# Patient Record
Sex: Female | Born: 1955 | Race: Black or African American | Hispanic: No | Smoking: Former smoker
Health system: Southern US, Community
[De-identification: ages and names within clinical notes are randomized; demographics above are authoritative.]

## PROBLEM LIST (undated history)

## (undated) DIAGNOSIS — M109 Gout, unspecified: Secondary | ICD-10-CM

## (undated) DIAGNOSIS — M797 Fibromyalgia: Secondary | ICD-10-CM

## (undated) DIAGNOSIS — F32A Depression, unspecified: Secondary | ICD-10-CM

## (undated) DIAGNOSIS — M1990 Unspecified osteoarthritis, unspecified site: Secondary | ICD-10-CM

## (undated) DIAGNOSIS — G4700 Insomnia, unspecified: Secondary | ICD-10-CM

## (undated) DIAGNOSIS — Z780 Asymptomatic menopausal state: Secondary | ICD-10-CM

## (undated) DIAGNOSIS — E785 Hyperlipidemia, unspecified: Secondary | ICD-10-CM

## (undated) DIAGNOSIS — K635 Polyp of colon: Secondary | ICD-10-CM

## (undated) DIAGNOSIS — I10 Essential (primary) hypertension: Secondary | ICD-10-CM

## (undated) DIAGNOSIS — F329 Major depressive disorder, single episode, unspecified: Secondary | ICD-10-CM

## (undated) HISTORY — DX: Unspecified osteoarthritis, unspecified site: M19.90

## (undated) HISTORY — PX: STRABISMUS SURGERY: SHX218

## (undated) HISTORY — DX: Depression, unspecified: F32.A

## (undated) HISTORY — DX: Essential (primary) hypertension: I10

## (undated) HISTORY — DX: Hyperlipidemia, unspecified: E78.5

## (undated) HISTORY — DX: Polyp of colon: K63.5

## (undated) HISTORY — DX: Fibromyalgia: M79.7

## (undated) HISTORY — DX: Insomnia, unspecified: G47.00

## (undated) HISTORY — DX: Asymptomatic menopausal state: Z78.0

## (undated) HISTORY — DX: Major depressive disorder, single episode, unspecified: F32.9

---

## 1988-02-15 HISTORY — PX: TUBAL LIGATION: SHX77

## 2004-03-17 ENCOUNTER — Emergency Department: Payer: Self-pay | Admitting: Emergency Medicine

## 2004-09-06 ENCOUNTER — Ambulatory Visit: Payer: Self-pay

## 2005-06-16 ENCOUNTER — Emergency Department: Payer: Self-pay | Admitting: Emergency Medicine

## 2005-11-23 ENCOUNTER — Ambulatory Visit: Payer: Self-pay | Admitting: Nurse Practitioner

## 2006-05-16 ENCOUNTER — Emergency Department: Payer: Self-pay | Admitting: Emergency Medicine

## 2007-01-17 ENCOUNTER — Emergency Department: Payer: Self-pay | Admitting: Emergency Medicine

## 2007-02-15 DIAGNOSIS — K635 Polyp of colon: Secondary | ICD-10-CM

## 2007-02-15 HISTORY — DX: Polyp of colon: K63.5

## 2007-02-15 HISTORY — PX: COLONOSCOPY: SHX174

## 2007-04-05 ENCOUNTER — Ambulatory Visit: Payer: Self-pay | Admitting: Gastroenterology

## 2007-05-16 LAB — HM COLONOSCOPY

## 2007-06-14 ENCOUNTER — Ambulatory Visit: Payer: Self-pay | Admitting: Gastroenterology

## 2007-09-14 ENCOUNTER — Ambulatory Visit: Payer: Self-pay | Admitting: Family Medicine

## 2008-01-28 ENCOUNTER — Emergency Department: Payer: Self-pay

## 2008-09-23 ENCOUNTER — Ambulatory Visit: Payer: Self-pay | Admitting: Specialist

## 2009-01-14 HISTORY — PX: KNEE SURGERY: SHX244

## 2009-01-30 ENCOUNTER — Ambulatory Visit: Payer: Self-pay | Admitting: Specialist

## 2009-02-11 ENCOUNTER — Ambulatory Visit: Payer: Self-pay | Admitting: Specialist

## 2009-04-13 ENCOUNTER — Ambulatory Visit: Payer: Self-pay | Admitting: Family Medicine

## 2009-08-03 ENCOUNTER — Emergency Department: Payer: Self-pay | Admitting: Emergency Medicine

## 2009-08-12 ENCOUNTER — Ambulatory Visit: Payer: Self-pay | Admitting: Specialist

## 2009-08-28 ENCOUNTER — Encounter: Payer: Self-pay | Admitting: Cardiovascular Disease

## 2009-09-02 ENCOUNTER — Ambulatory Visit: Payer: Self-pay | Admitting: Cardiovascular Disease

## 2009-09-02 DIAGNOSIS — I10 Essential (primary) hypertension: Secondary | ICD-10-CM | POA: Insufficient documentation

## 2009-09-02 DIAGNOSIS — R9431 Abnormal electrocardiogram [ECG] [EKG]: Secondary | ICD-10-CM | POA: Insufficient documentation

## 2009-11-17 ENCOUNTER — Ambulatory Visit: Payer: Self-pay | Admitting: Pain Medicine

## 2009-12-03 ENCOUNTER — Ambulatory Visit: Payer: Self-pay | Admitting: Pain Medicine

## 2010-02-11 ENCOUNTER — Ambulatory Visit: Payer: Self-pay | Admitting: Family Medicine

## 2010-02-18 ENCOUNTER — Emergency Department: Payer: Self-pay | Admitting: Internal Medicine

## 2010-03-16 NOTE — Assessment & Plan Note (Signed)
Summary: NEW PT   Visit Type:  Initial Consult Primary Provider:  Dr. Ancil Boozer  CC:  c/o problems with fibromyalgia.Marland Kitchen  History of Present Illness: Cheryl Barr is a pleasant 55 year old woman with a history of diabetes, hypertension, hyperlipidemia, abnormal EKG who presents by referral from Dr. Ancil Boozer for evaluation of an abnormal EKG.  Cheryl Barr denies any significant chest discomfort, shortness of breath, lightheadedness. She does have chronic right lower extremity lateral thigh discomfort which is chronic and quite severe at times. This has been ongoing since May. Prior to this, she was very active and had no complaints. She does report having some menopausal type symptoms with flushing at times. The recent heat wave has made this very difficult.  EKG from March 2010 shows normal sinus rhythm with rate of 51 beats per minute him a nonspecific T-wave abnormality in the anterolateral leads and V1 and AVL.  EKG from August 28 2009 shows normal sinus rhythm with rate 59 beats per minute with nonspecific T-wave abnormality in V4 through V6.   Preventive Screening-Counseling & Management  Alcohol-Tobacco     Smoking Status: never  Caffeine-Diet-Exercise     Does Patient Exercise: no  Current Medications (verified): 1)  Calcium With Vitamin D .... 1 Once Daily 2)  Actos 30 Mg Tabs (Pioglitazone Hcl) .Marland Kitchen.. 1 Once Daily 3)  Exforge Hct 10-320-25 Mg Tabs (Amlodipine-Valsartan-Hctz) .Marland Kitchen.. 1 Once Daily 4)  Carvedilol 12.5 Mg Tabs (Carvedilol) .Marland Kitchen.. 1 Two Times A Day 5)  Simvastatin 40 Mg Tabs (Simvastatin) .Marland Kitchen.. 1 Once Daily 6)  Meloxicam 15 Mg Tabs (Meloxicam) .Marland Kitchen.. 1 Once Daily 7)  Gabapentin 300 Mg Caps (Gabapentin) .Marland Kitchen.. 1 Once Daily  Allergies (verified): No Known Drug Allergies   Past History:  Family History: Last updated: 09/01/2009 Family History of Coronary Artery Disease: Father deceased MI age 23. Family History of Diabetes: Mother Family History of Hypertension: Mother  Social  History: Last updated: 09/02/2009 Alcohol Use - no Tobacco Use - No.  Regular Exercise - no Widowed   Risk Factors: Exercise: no (09/02/2009)  Risk Factors: Smoking Status: never (09/02/2009)  Past Medical History: Hyperlipidemia Hypertension Diabetes Type 2 Vitamin D Deficency insomnia depression menopause fibromyalgia  Social History: Alcohol Use - no Tobacco Use - No.  Regular Exercise - no Widowed  Smoking Status:  never Does Patient Exercise:  no  Review of Systems  The patient denies fever, weight loss, weight gain, vision loss, decreased hearing, hoarseness, chest pain, syncope, dyspnea on exertion, peripheral edema, prolonged cough, abdominal pain, incontinence, muscle weakness, depression, and enlarged lymph nodes.         Right lateral leg pain  Vital Signs:  Patient profile:   55 year old female Height:      68 inches Weight:      232 pounds BMI:     35.40 Pulse rate:   84 / minute BP sitting:   136 / 80  (left arm) Cuff size:   large  Vitals Entered By: Dolores Lory, CMA (September 02, 2009 2:34 PM)  Physical Exam  General:  Well developed, well nourished, in no acute distress. Head:  normocephalic and atraumatic Neck:  Neck supple, no JVD. No masses, thyromegaly or abnormal cervical nodes. Lungs:  Clear bilaterally to auscultation and percussion. Heart:  Non-displaced PMI, chest non-tender; regular rate and rhythm, S1, S2 without murmurs, rubs or gallops. Carotid upstroke normal, no bruit. . Pedals normal pulses. No edema, no varicosities. Abdomen:  Bowel sounds positive; abdomen soft and non-tender without  masses Msk:  Back normal, normal gait. Muscle strength and tone normal. Pulses:  pulses normal in all 4 extremities Extremities:  No clubbing or cyanosis. Neurologic:  Alert and oriented x 3. Skin:  Intact without lesions or rashes. Psych:  Normal affect.   Impression & Recommendations:  Problem # 1:  ABNORMAL EKG (ICD-794.31) EKG from  March 2010 shows borderline anterolateral and lateral T wave abnormality. repeat EKG last week again shows the same abnormalities. She is asymptomatic. She's been treated aggressively for her diabetes, hyperlipidemia and hypertension.  We have suggested to her that we can watch her for now with no additional testing as she has no symptoms. If she has symptoms of shortness of breath chest discomfort with exertion, I have asked her to contact us for further evaluation. She is in agreement with this and I have given her our contact information.  I did suggest to her that she could start an aspirin 81 mg daily. given her diabetes, her LDL goal should be less than 100.   The following medications were removed from the medication list:    Aspir-low 81 Mg Tbec (Aspirin) .Marland Kitchen... 1 once daily Her updated medication list for this problem includes:    Exforge Hct 10-320-25 Mg Tabs (Amlodipine-valsartan-hctz) .Marland Kitchen... 1 once daily    Carvedilol 12.5 Mg Tabs (Carvedilol) .Marland Kitchen... 1 two times a day    Aspirin 81 Mg Tbec (Aspirin) .Marland Kitchen... Take one tablet by mouth daily  Problem # 2:  HYPERTENSION, BENIGN (ICD-401.1)  Blood pressure is reasonably well controlled current medication regimen.  The following medications were removed from the medication list:    Aspir-low 81 Mg Tbec (Aspirin) .Marland Kitchen... 1 once daily Her updated medication list for this problem includes:    Exforge Hct 10-320-25 Mg Tabs (Amlodipine-valsartan-hctz) .Marland Kitchen... 1 once daily    Carvedilol 12.5 Mg Tabs (Carvedilol) .Marland Kitchen... 1 two times a day    Aspirin 81 Mg Tbec (Aspirin) .Marland Kitchen... Take one tablet by mouth daily  The following medications were removed from the medication list:    Aspir-low 81 Mg Tbec (Aspirin) .Marland Kitchen... 1 once daily Her updated medication list for this problem includes:    Exforge Hct 10-320-25 Mg Tabs (Amlodipine-valsartan-hctz) .Marland Kitchen... 1 once daily    Carvedilol 12.5 Mg Tabs (Carvedilol) .Marland Kitchen... 1 two times a day    Aspirin 81 Mg Tbec  (Aspirin) .Marland Kitchen... Take one tablet by mouth daily  Problem # 3:  HYPERLIPIDEMIA-MIXED (ICD-272.4)  Recent change from Pravachol to simvastatin 40 mg daily.  Her updated medication list for this problem includes:    Simvastatin 40 Mg Tabs (Simvastatin) .Marland Kitchen... 1 once daily  Her updated medication list for this problem includes:    Simvastatin 40 Mg Tabs (Simvastatin) .Marland Kitchen... 1 once daily  Patient Instructions: 1)  Your physician has recommended you make the following change in your medication: START aspirin 81 mg daily 2)  Your physician wants you to follow-up in:   prn You will receive a reminder letter in the mail two months in advance. If you don't receive a letter, please call our office to schedule the follow-up appointment.

## 2010-03-16 NOTE — Letter (Signed)
Summary: [facility] NOTE  [facility] NOTE   Imported By: Luana Shu 09/02/2009 16:23:18  _____________________________________________________________________  External Attachment:    Type:   Image     Comment:   External Document

## 2010-03-17 LAB — HM DEXA SCAN: HM Dexa Scan: NORMAL

## 2010-09-02 ENCOUNTER — Encounter: Payer: Self-pay | Admitting: Cardiovascular Disease

## 2011-10-27 ENCOUNTER — Emergency Department: Payer: Self-pay | Admitting: Emergency Medicine

## 2013-02-14 HISTORY — PX: BREAST SURGERY: SHX581

## 2013-03-19 LAB — HM PAP SMEAR: HM Pap smear: NORMAL

## 2013-04-23 ENCOUNTER — Ambulatory Visit: Payer: Self-pay | Admitting: Family Medicine

## 2013-04-23 LAB — HM MAMMOGRAPHY

## 2013-05-10 ENCOUNTER — Ambulatory Visit: Payer: Self-pay | Admitting: Family Medicine

## 2013-05-21 ENCOUNTER — Ambulatory Visit: Payer: Self-pay | Admitting: Family Medicine

## 2013-05-23 LAB — PATHOLOGY REPORT

## 2013-05-24 HISTORY — PX: BREAST BIOPSY: SHX20

## 2013-05-26 ENCOUNTER — Emergency Department: Payer: Self-pay | Admitting: Emergency Medicine

## 2013-05-30 ENCOUNTER — Ambulatory Visit (INDEPENDENT_AMBULATORY_CARE_PROVIDER_SITE_OTHER): Payer: Commercial Managed Care - PPO | Admitting: General Surgery

## 2013-05-30 ENCOUNTER — Other Ambulatory Visit: Payer: Commercial Managed Care - PPO

## 2013-05-30 ENCOUNTER — Encounter: Payer: Self-pay | Admitting: General Surgery

## 2013-05-30 VITALS — BP 122/82 | HR 80 | Resp 18 | Ht 67.0 in | Wt 228.0 lb

## 2013-05-30 DIAGNOSIS — N63 Unspecified lump in unspecified breast: Secondary | ICD-10-CM

## 2013-05-30 NOTE — Progress Notes (Addendum)
Patient ID: Cheryl Barr, female   DOB: 05-12-1955, 58 y.o.   MRN: [id number]  Chief Complaint  Patient presents with  . Other    New pt evaluation of left breast mass. Mammogram and Biopsy done at [facility]    HPI Cheryl Barr is a 58 y.o. female who presents for a breast evaluation. The most recent mammogram was done on 05/10/13. Patient does not perform regular self breast checks and has not had a mammogram done since 2011.    She also had a biopsy done on 05/21/13.  She denies any problems with her breasts.  Her father was diagnosed in his 37 with breast cancer.   HPI  Past Medical History  Diagnosis Date  . Hyperlipidemia   . Hypertension   . Diabetes mellitus     Type 2  . Vitamin D deficiency   . Insomnia   . Depression   . Menopause   . Fibromyalgia   . Arthritis   . Colon polyp 2009    Past Surgical History  Procedure Laterality Date  . Colonoscopy  2009    1 polyp found. Dr. Roselyn Reef    Family History  Problem Relation Age of Onset  . Hypertension Mother   . Diabetes Mother   . Heart attack Father   . Coronary artery disease Father   . Cancer Father 64    breast    Social History History  Substance Use Topics  . Smoking status: Never Smoker   . Smokeless tobacco: Never Used     Comment: Tobacco use-no  . Alcohol Use: No    No Known Allergies  Current Outpatient Prescriptions  Medication Sig Dispense Refill  . Amlodipine-Valsartan-HCTZ (EXFORGE HCT) 10-320-25 MG TABS Take 1 tablet by mouth daily.        Marland Kitchen aspirin 81 MG EC tablet Take 81 mg by mouth daily.        Marland Kitchen CALCIUM-VITAMIN D PO Take 1 tablet by mouth daily.        . carvedilol (COREG) 12.5 MG tablet Take 12.5 mg by mouth 2 (two) times daily.        Marland Kitchen gabapentin (NEURONTIN) 300 MG capsule Take 300 mg by mouth daily.        . meloxicam (MOBIC) 15 MG tablet Take 15 mg by mouth daily.        . pioglitazone (ACTOS) 30 MG tablet Take 30 mg by mouth daily.        . simvastatin (ZOCOR) 40 MG tablet  Take 40 mg by mouth daily.         No current facility-administered medications for this visit.    Review of Systems Review of Systems  Constitutional: Negative.   Respiratory: Negative.   Cardiovascular: Negative.     Blood pressure 122/82, pulse 80, resp. rate 18, height 5\' 7"  (1.702 m), weight 228 lb (103.42 kg).  Physical Exam Physical Exam  Constitutional: She is oriented to person, place, and time. She appears well-developed and well-nourished.  Neck: Neck supple. No thyromegaly present.  Cardiovascular: Normal rate, regular rhythm and normal heart sounds.   No murmur heard. Pulmonary/Chest: Effort normal and breath sounds normal. Right breast exhibits no inverted nipple, no mass, no nipple discharge, no skin change and no tenderness. Left breast exhibits no inverted nipple, no mass, no nipple discharge, no skin change and no tenderness.  1.5 cm nodule at 3 o'clock at the biopsy site.   Lymphadenopathy:    She has no cervical adenopathy.  She has no axillary adenopathy.  Neurological: She is alert and oriented to person, place, and time.  Skin: Skin is warm and dry.    Data Reviewed Biopsy dated May 21, 2013 reported a small fragment of benign breast epithelium with focal acute inflammation. No evidence of atypia or malignancy.  Screening mammograms of April 23, 2013 suggested a density in the left breast for which additional views and ultrasound were requested. BI-RAD-0.  Focal spot compression views and ultrasound dated May 10, 2013 showed a persistent density in the retroareolar area of the left breast. Ultrasound showed a 7 x 8 x 10 mm intraductal mass within the retroareolar tissue with internal blood flow. Impression was papilloma versus neoplasm. Biopsy was recommended. BI-RAD-4.  Ultrasound-guided core biopsy dated May 21, 2013 and postbiopsy mammograms were reviewed.  PCP notes from April 05, 2013 were reviewed.  Ultrasound examination of the left  breast an area of palpable mass near the biopsy site, 3 o'clock position, 2 cm from the nipple showed a 0.78 x 1.26 x 1.65 hypoechoic mass with evidence of a marking clip in place. This is in continuity with a modestly dilated duct. A cystic area measuring 0.5 x 0.7 x 0.9 cm it is inferior to the intraluminal lesion which measures 0.9 x 1.0 cm. In this primarily cystic area appears to be the biopsy clip.  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       Plan    Considering the previous biopsy, and the fact that the mass extends almost to the undersurface of the dermis the patient will likely best be served by surgical excision of this area.  Due to late hour it was not possible to review this plan with her primary physician who will be contacted in the morning.       PCP: Steele Sizer  I spoke with Dr. Duard Brady [facility]) the morning after the patient's visit. She reports that she been contacted by ancillary staff from the radiology department reporting inadequate biopsy had been obtained and surgical referral was recommended. ( Neither) Either she nor I could identify any addendum to the 05/21/2013 postbiopsy mammogram report mentioning the pathology or inadequacy of sample.  The patient will be contacted and arrangements made for open surgical biopsy.  Forest Gleason Naimah Yingst 05/31/2013, 5:38 AM

## 2013-05-30 NOTE — Patient Instructions (Signed)
The patient is aware to call back for any questions or concerns. Dr. Bary Castilla will contact Dr. Ancil Boozer and our office will contact the patient with the next step.

## 2013-05-31 ENCOUNTER — Telehealth: Payer: Self-pay

## 2013-05-31 ENCOUNTER — Encounter: Payer: Self-pay | Admitting: General Surgery

## 2013-05-31 DIAGNOSIS — N63 Unspecified lump in unspecified breast: Secondary | ICD-10-CM | POA: Insufficient documentation

## 2013-05-31 NOTE — Telephone Encounter (Signed)
Spoke with patient about scheduling breast biopsy at [facility] and patient is amendable to this. Patient is scheduled for surgery at [facility] on 06/21/13. She will pre admit by phone on 06/14/13. Patient is aware of date and instructions. Surgery instruction sheet mailed to patient.

## 2013-05-31 NOTE — Telephone Encounter (Signed)
Message copied by Lesly Rubenstein on Fri May 31, 2013 12:57 PM ------      Message from: Robert Bellow      Created: Fri May 31, 2013  8:41 AM       Scheduling slip submitted for left breast biopsy. As mentioned the patient this might be done in the office, I think she would be best served by having it done as an outpatient at [facility]. His can be scheduled at her convenience. ------

## 2013-06-09 ENCOUNTER — Other Ambulatory Visit: Payer: Self-pay | Admitting: General Surgery

## 2013-06-09 DIAGNOSIS — N63 Unspecified lump in unspecified breast: Secondary | ICD-10-CM

## 2013-06-17 ENCOUNTER — Ambulatory Visit: Payer: Self-pay | Admitting: Anesthesiology

## 2013-06-17 ENCOUNTER — Encounter: Payer: Self-pay | Admitting: General Surgery

## 2013-06-17 LAB — BASIC METABOLIC PANEL
Anion Gap: 7 (ref 7–16)
BUN: 18 mg/dL (ref 7–18)
CHLORIDE: 104 mmol/L (ref 98–107)
CREATININE: 1.03 mg/dL (ref 0.60–1.30)
Calcium, Total: 9.6 mg/dL (ref 8.5–10.1)
Co2: 30 mmol/L (ref 21–32)
EGFR (African American): >60
EGFR (Non-African Amer.): >60
Glucose: 137 mg/dL — ABNORMAL HIGH (ref 65–99)
OSMOLALITY: 285 (ref 275–301)
Potassium: 3.1 mmol/L — ABNORMAL LOW (ref 3.5–5.1)
Sodium: 141 mmol/L (ref 136–145)

## 2013-06-18 ENCOUNTER — Telehealth: Payer: Self-pay

## 2013-06-18 NOTE — Progress Notes (Signed)
Patient ID: Cheryl Barr, female   DOB: 1955/09/12, 59 y.o.   MRN: [id number] Due to changes noted on her most recent EKG, Pre Admit testing at [facility] has said that the patient will need a Medical clearance for surgery scheduled for 06/21/13. The patient is scheduled to see Dr Saralyn Pilar at [facility] on 06/20/13 at 10 am for clearance.

## 2013-06-19 ENCOUNTER — Telehealth: Payer: Self-pay | Admitting: General Surgery

## 2013-06-19 NOTE — Telephone Encounter (Signed)
Notified KCL low and need for supplement preop. RX for KCL, 20 mEq called to [employer] [facility], #10; 1 po TID. Patient to start med this PM.

## 2013-06-21 ENCOUNTER — Ambulatory Visit: Payer: Self-pay | Admitting: General Surgery

## 2013-06-21 DIAGNOSIS — D249 Benign neoplasm of unspecified breast: Secondary | ICD-10-CM

## 2013-06-21 HISTORY — PX: BREAST EXCISIONAL BIOPSY: SUR124

## 2013-06-24 ENCOUNTER — Encounter: Payer: Self-pay | Admitting: General Surgery

## 2013-06-24 ENCOUNTER — Telehealth: Payer: Self-pay | Admitting: General Surgery

## 2013-06-24 LAB — PATHOLOGY REPORT

## 2013-06-24 NOTE — Telephone Encounter (Signed)
Notified path benign, papilloma.   Patient reports she is pain free, anxious to return to work tomorrow.  OK for return to work.  Will f/u as scheduled for post op check.

## 2013-06-26 ENCOUNTER — Encounter: Payer: Self-pay | Admitting: General Surgery

## 2013-06-26 ENCOUNTER — Ambulatory Visit (INDEPENDENT_AMBULATORY_CARE_PROVIDER_SITE_OTHER): Payer: Commercial Managed Care - PPO | Admitting: General Surgery

## 2013-06-26 VITALS — BP 140/86 | HR 82 | Resp 12 | Ht 67.0 in | Wt 221.0 lb

## 2013-06-26 DIAGNOSIS — N63 Unspecified lump in unspecified breast: Secondary | ICD-10-CM

## 2013-06-26 DIAGNOSIS — D249 Benign neoplasm of unspecified breast: Secondary | ICD-10-CM

## 2013-06-26 NOTE — Patient Instructions (Signed)
Patient to return in 1 month. The patient is aware to call back for any questions or concerns.

## 2013-06-26 NOTE — Progress Notes (Signed)
Patient ID: Cheryl Barr, female   DOB: 1955/06/25, 58 y.o.   MRN: [id number]  Chief Complaint  Patient presents with  . Routine Post Op    Post op left breast excision    HPI Cheryl Barr is a 58 y.o. female who presents for a post op left breast excision. The procedure was performed on 06/21/13. The patient denies any new problems at this time.   HPI  Past Medical History  Diagnosis Date  . Hyperlipidemia   . Hypertension   . Diabetes mellitus     Type 2  . Vitamin D deficiency   . Insomnia   . Depression   . Menopause   . Fibromyalgia   . Arthritis   . Colon polyp 2009    Past Surgical History  Procedure Laterality Date  . Colonoscopy  2009    1 polyp found. Dr. Roselyn Reef  . Breast surgery Left 2015    Family History  Problem Relation Age of Onset  . Hypertension Mother   . Diabetes Mother   . Heart attack Father   . Coronary artery disease Father   . Cancer Father 61    breast    Social History History  Substance Use Topics  . Smoking status: Never Smoker   . Smokeless tobacco: Never Used     Comment: Tobacco use-no  . Alcohol Use: No    No Known Allergies  Current Outpatient Prescriptions  Medication Sig Dispense Refill  . Amlodipine-Valsartan-HCTZ (EXFORGE HCT) 10-320-25 MG TABS Take 1 tablet by mouth daily.        Marland Kitchen aspirin 81 MG EC tablet Take 81 mg by mouth daily.        Marland Kitchen CALCIUM-VITAMIN D PO Take 1 tablet by mouth daily.        . carvedilol (COREG) 12.5 MG tablet Take 12.5 mg by mouth 2 (two) times daily.        Marland Kitchen gabapentin (NEURONTIN) 300 MG capsule Take 300 mg by mouth daily.        . meloxicam (MOBIC) 15 MG tablet Take 15 mg by mouth daily.        . pioglitazone (ACTOS) 30 MG tablet Take 30 mg by mouth daily.        . simvastatin (ZOCOR) 40 MG tablet Take 40 mg by mouth daily.         No current facility-administered medications for this visit.    Review of Systems Review of Systems  Constitutional: Negative.   Respiratory: Negative.    Cardiovascular: Negative.     Blood pressure 140/86, pulse 82, resp. rate 12, height 5\' 7"  (1.702 m), weight 221 lb (100.245 kg).  Physical Exam Physical Exam  Constitutional: She is oriented to person, place, and time. She appears well-developed and well-nourished.  Pulmonary/Chest:  Well healing left breast incision site.   Neurological: She is alert and oriented to person, place, and time.  Skin: Skin is warm and dry.    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  Assessment    Doing well status post excision of papilloma.    Plan    We'll plan for followup examination one month.    PCP: Yehuda Mao Omir Cooprider 06/27/2013, 3:33 PM

## 2013-06-27 DIAGNOSIS — D249 Benign neoplasm of unspecified breast: Secondary | ICD-10-CM | POA: Insufficient documentation

## 2013-07-30 ENCOUNTER — Ambulatory Visit (INDEPENDENT_AMBULATORY_CARE_PROVIDER_SITE_OTHER): Payer: Commercial Managed Care - PPO | Admitting: General Surgery

## 2013-07-30 ENCOUNTER — Encounter: Payer: Self-pay | Admitting: General Surgery

## 2013-07-30 VITALS — BP 140/84 | HR 76 | Resp 16 | Ht 67.0 in | Wt 224.0 lb

## 2013-07-30 DIAGNOSIS — D249 Benign neoplasm of unspecified breast: Secondary | ICD-10-CM

## 2013-07-30 DIAGNOSIS — K439 Ventral hernia without obstruction or gangrene: Secondary | ICD-10-CM

## 2013-07-30 NOTE — Patient Instructions (Signed)
Patient to be scheduled for hernia repair. The patient is aware to call back for any questions or concerns.

## 2013-07-30 NOTE — Progress Notes (Signed)
Patient ID: Cheryl Barr, female   DOB: 02/11/1956, 58 y.o.   MRN: [id number]  Chief Complaint  Patient presents with  . Routine Post Op    1 month follow up left breast excision    HPI Cheryl Barr is a 58 y.o. female who presents for a post op left breast papilloma excision. The procedure was performed on 06/21/13. The patient denies any new problems at this time.  Patient would also like to discuss an abdominal hernia. She does have intermittent pain in this area that brings her to her knees at times. It's been present for years.   HPI  Past Medical History  Diagnosis Date  . Hyperlipidemia   . Hypertension   . Diabetes mellitus     Type 2  . Vitamin D deficiency   . Insomnia   . Depression   . Menopause   . Fibromyalgia   . Arthritis   . Colon polyp 2009    Past Surgical History  Procedure Laterality Date  . Colonoscopy  2009    1 polyp found. Dr. Roselyn Reef  . Breast surgery Left 2015    Family History  Problem Relation Age of Onset  . Hypertension Mother   . Diabetes Mother   . Heart attack Father   . Coronary artery disease Father   . Cancer Father 44    breast    Social History History  Substance Use Topics  . Smoking status: Never Smoker   . Smokeless tobacco: Never Used     Comment: Tobacco use-no  . Alcohol Use: No    No Known Allergies  Current Outpatient Prescriptions  Medication Sig Dispense Refill  . Amlodipine-Valsartan-HCTZ (EXFORGE HCT) 10-320-25 MG TABS Take 1 tablet by mouth daily.        Marland Kitchen aspirin 81 MG EC tablet Take 81 mg by mouth daily.        . pioglitazone (ACTOS) 30 MG tablet Take 30 mg by mouth daily.        . simvastatin (ZOCOR) 40 MG tablet Take 40 mg by mouth daily.         No current facility-administered medications for this visit.    Review of Systems Review of Systems  Constitutional: Negative.   Respiratory: Negative.   Cardiovascular: Negative.     Blood pressure 140/84, pulse 76, resp. rate 16, height 5\' 7"   (1.702 m), weight 224 lb (101.606 kg).  Physical Exam Physical Exam  Constitutional: She is oriented to person, place, and time. She appears well-developed and well-nourished.  Pulmonary/Chest:    Left breast well healed incision site.   Abdominal: Bowel sounds are normal. There is no hepatosplenomegaly. There is no tenderness. A hernia ( epigastric hernia 3 cm in diameter.) is present.    Neurological: She is alert and oriented to person, place, and time.  Skin: Skin is warm and dry.         Assessment    Doing well status post excision left breast papilloma.  Ventral hernia, symptomatic.     Plan    The patient can resume annual screening mammograms under the care of her primary care physician.  Considering the episodes of severe pain in the epigastrium she's been experiencing related to her hernia, a elective repair was appropriate. If the defect is indeed only 3 cm she may require only a primary repair. If it's larger than this her of the tissue appears weak, mesh repair may be appropriate. The pros and cons of surgical intervention were  discussed. The small risk of infection with prosthetic mesh material was reviewed.     PCP: Frazier Butt 07/30/2013, 10:13 PM

## 2013-12-16 ENCOUNTER — Encounter: Payer: Self-pay | Admitting: General Surgery

## 2014-01-06 ENCOUNTER — Ambulatory Visit: Payer: Self-pay | Admitting: Family Medicine

## 2014-01-20 ENCOUNTER — Institutional Professional Consult (permissible substitution): Payer: Self-pay | Admitting: Internal Medicine

## 2014-01-21 ENCOUNTER — Encounter: Payer: Self-pay | Admitting: Internal Medicine

## 2014-01-21 ENCOUNTER — Ambulatory Visit (INDEPENDENT_AMBULATORY_CARE_PROVIDER_SITE_OTHER): Payer: 59 | Admitting: Internal Medicine

## 2014-01-21 VITALS — BP 148/90 | HR 93 | Temp 99.1°F | Ht 67.0 in | Wt 222.0 lb

## 2014-01-21 DIAGNOSIS — R05 Cough: Secondary | ICD-10-CM

## 2014-01-21 DIAGNOSIS — R059 Cough, unspecified: Secondary | ICD-10-CM | POA: Insufficient documentation

## 2014-01-21 DIAGNOSIS — R062 Wheezing: Secondary | ICD-10-CM

## 2014-01-21 MED ORDER — ALBUTEROL SULFATE HFA 108 (90 BASE) MCG/ACT IN AERS: 2.0000 | INHALATION_SPRAY | Freq: Four times a day (QID) | RESPIRATORY_TRACT | Status: DC | PRN

## 2014-01-21 MED ORDER — ALBUTEROL SULFATE HFA 108 (90 BASE) MCG/ACT IN AERS: INHALATION_SPRAY | RESPIRATORY_TRACT | Status: DC

## 2014-01-21 MED ORDER — OMEPRAZOLE 40 MG PO CPDR
40.0000 mg | DELAYED_RELEASE_CAPSULE | Freq: Every day | ORAL | Status: DC
Start: 2014-01-21 — End: 2014-03-13

## 2014-01-21 NOTE — Addendum Note (Signed)
Addended by: Devona Konig on: 01/21/2014 05:20 PM   Modules accepted: Orders

## 2014-01-21 NOTE — Progress Notes (Signed)
Date: 01/21/2014  MRN# [id number] Cheryl Barr 08-31-1955  Referring Physician:   Destany Beauman is a 58 y.o. old female seen in consultation for   CC:  Chief Complaint  Patient presents with  . Advice Only    Patient referred by Dr. Ancil Boozer. Patient has been coughing for 3 months following a cold. She has a clear phlegm with cough. She denies wheeze and chest tightness.    HPI:  Patient is a pleasant 58 year old female past medical history significant for hypertension, vitamin D deficiency, osteoarthritis, obesity, referred by her primary care physician ([facility]) for recurrent cough over the past 3 months. Patient had stated that she had a "bad cold 3 months ago," it lasted about month; Symptoms cough, productive (thick and white), runny nose - treated with cough syrup and inhalers, antibiotics. Since then patient has had a persistent cough, small bouts, with no wheezes, dry cough, sounds like throat clearing.  Worst at night when lying down and in the mornings, occasional sour burps at night. Patient that the cough is reducing, but not completely resolving itself.currently, patient states the cough is nonproductive, and feels like "I have to clear my throat all the time ." She also stated that every year she gets a cold/cough from fall into winter.    PMHX:   Past Medical History  Diagnosis Date  . Hyperlipidemia   . Hypertension   . Diabetes mellitus     Type 2  . Vitamin D deficiency   . Insomnia   . Depression   . Menopause   . Fibromyalgia   . Arthritis   . Colon polyp 2009   Surgical Hx:  Past Surgical History  Procedure Laterality Date  . Colonoscopy  2009    1 polyp found. Dr. Roselyn Reef  . Breast surgery Left 2015   Family Hx:  Family History  Problem Relation Age of Onset  . Hypertension Mother   . Diabetes Mother   . Heart attack Father   . Coronary artery disease Father   . Cancer Father 43    breast   Social Hx:   History  Substance Use  Topics  . Smoking status: Never Smoker   . Smokeless tobacco: Never Used     Comment: Tobacco use-no  . Alcohol Use: No   Medication:   Current Outpatient Rx  Name  Route  Sig  Dispense  Refill  . amLODipine-valsartan (EXFORGE) 10-320 MG per tablet   Oral   Take 1 tablet by mouth daily.         Marland Kitchen atorvastatin (LIPITOR) 40 MG tablet   Oral   Take 40 mg by mouth daily.         . carvedilol (COREG) 6.25 MG tablet   Oral   Take 6.25 mg by mouth 2 (two) times daily with a meal.         . metFORMIN (GLUCOPHAGE) 1000 MG tablet   Oral   Take 1,000 mg by mouth 2 (two) times daily with a meal.         . albuterol (PROVENTIL HFA;VENTOLIN HFA) 108 (90 BASE) MCG/ACT inhaler   Inhalation   Inhale 2 puffs into the lungs every 6 (six) hours as needed for wheezing or shortness of breath (inhale 2 pufts every night for 3 nights then every 6 hours prn).   1 Inhaler   2   . aspirin 81 MG EC tablet   Oral   Take 81 mg by mouth daily.           Marland Kitchen  omeprazole (PRILOSEC) 40 MG capsule   Oral   Take 1 capsule (40 mg total) by mouth daily. Take in th  Evenings.   30 capsule   1       Allergies:  Review of patient's allergies indicates no known allergies.  Review of Systems: Gen:  Denies  fever, sweats, chills HEENT: Denies blurred vision, double vision, ear pain, eye pain, hearing loss, nose bleeds, sore throat Cvc:  No dizziness, chest pain or heaviness Resp: cough (non productive) Gi: Denies swallowing difficulty, stomach pain, nausea or vomiting, diarrhea, constipation, bowel incontinence Gu:  Denies bladder incontinence, burning urine Ext:   No Joint pain, stiffness or swelling Skin: No skin rash, easy bruising or bleeding or hives Endoc:  No polyuria, polydipsia , polyphagia or weight change Psych: No depression, insomnia or hallucinations  Other:  All other systems negative  Physical Examination:   VS: BP 148/90 mmHg  Pulse 93  Temp(Src) 99.1 F (37.3 C) (Oral)   Ht 5\' 7"  (1.702 m)  Wt 222 lb (100.699 kg)  BMI 34.76 kg/m2  SpO2 95%  General Appearance: No distress  Neuro:without focal findings, mental status, speech normal, alert and oriented, cranial nerves 2-12 intact, reflexes normal and symmetric, sensation grossly normal  HEENT: PERRLA, EOM intact, no ptosis, no other lesions noticed; Mallampati 3 Pulmonary: normal breath sounds., diaphragmatic excursion normal.No wheezing, No rales;   Sputum Production:   CardiovascularNormal S1,S2.  No m/r/g.  Abdominal aorta pulsation normal.    Abdomen: Benign, Soft, non-tender, No masses, hepatosplenomegaly, No lymphadenopathy Renal:  No costovertebral tenderness  GU:  No performed at this time. Endoc: No evident thyromegaly, no signs of acromegaly or Cushing features Skin:   warm, no rashes, no ecchymosis  Extremities: normal, no cyanosis, clubbing, no edema, warm with normal capillary refill. Other findings:none   Labs results:   Rad results: (The following images and results were reviewed by Dr. Stevenson Clinch). CXR 01/06/14 The heart size and mediastinal contours are within normal limits. Both lungs are clear. The visualized skeletal structures are unremarkable.   IMPRESSION: No active cardiopulmonary disease.  Assessment and Plan: Cough Multifactorial: Postinfectious, GERD, obstructive lung disease, allergic component  Plan: -Given history and physical examination, this cough seems most likely to be a combination of allergic component with postinfectious (most likely viral) etiology. -Further evaluation with pulmonary function testing in 6 to walk test. -Give a trial of omeprazole 40 mg at night, patient could be having silent reflux given her current history. -will also give a trial albuterol, 2 puffs at night x3 nights, then 2 puffs every 4 hours as needed for coughing spell, wheezing, shortness of breath.   The standardized cough guidelines published in Chest by Lissa Morales in 2006 are still  the best available and consist of a multiple step process (up to 12!) , not a single office visit,  and are intended  to address this problem logically,  with an alogrithm dependent on response to empiric treatment at  each progressive step  to determine a specific diagnosis with  minimal addtional testing needed. Therefore if adherence is an issue or can't be accurately verified,  it's very unlikely the standard evaluation and treatment will be successful here.    Furthermore, response to therapy (other than acute cough suppression, which should only be used short term with avoidance of narcotic containing cough syrups if possible), can be a gradual process for which the patient may perceive immediate benefit.  Unlike going to an eye doctor where  the best perscription is almost always the first one and is immediately effective, this is almost never the case in the management of chronic cough syndromes. Therefore the patient needs to commit up front to consistently adhere to recommendations  for up to 6 weeks of therapy directed at the likely underlying problem(s) before the response can be reasonably evaluated.     Updated Medication List Outpatient Encounter Prescriptions as of 01/21/2014  Medication Sig  . amLODipine-valsartan (EXFORGE) 10-320 MG per tablet Take 1 tablet by mouth daily.  Marland Kitchen atorvastatin (LIPITOR) 40 MG tablet Take 40 mg by mouth daily.  . carvedilol (COREG) 6.25 MG tablet Take 6.25 mg by mouth 2 (two) times daily with a meal.  . metFORMIN (GLUCOPHAGE) 1000 MG tablet Take 1,000 mg by mouth 2 (two) times daily with a meal.  . albuterol (PROVENTIL HFA;VENTOLIN HFA) 108 (90 BASE) MCG/ACT inhaler Inhale 2 puffs into the lungs every 6 (six) hours as needed for wheezing or shortness of breath (inhale 2 pufts every night for 3 nights then every 6 hours prn).  Marland Kitchen aspirin 81 MG EC tablet Take 81 mg by mouth daily.    Marland Kitchen omeprazole (PRILOSEC) 40 MG capsule Take 1 capsule (40 mg total) by mouth  daily. Take in th  Evenings.  . [DISCONTINUED] Amlodipine-Valsartan-HCTZ (EXFORGE HCT) 10-320-25 MG TABS Take 1 tablet by mouth daily.    . [DISCONTINUED] pioglitazone (ACTOS) 30 MG tablet Take 30 mg by mouth daily.    . [DISCONTINUED] simvastatin (ZOCOR) 40 MG tablet Take 40 mg by mouth daily.      Orders for this visit: No orders of the defined types were placed in this encounter.     Thank  you for the consultation and for allowing [facility] to assist in the care of your patient. Our recommendations are noted above.  Please contact us if we can be of further service.   Vilinda Boehringer, MD [facility] Office Number: [phone]

## 2014-01-21 NOTE — Assessment & Plan Note (Signed)
Multifactorial: Postinfectious, GERD, obstructive lung disease, allergic component  Plan: -Given history and physical examination, this cough seems most likely to be a combination of allergic component with postinfectious (most likely viral) etiology. -Further evaluation with pulmonary function testing in 6 to walk test. -Give a trial of omeprazole 40 mg at night, patient could be having silent reflux given her current history. -will also give a trial albuterol, 2 puffs at night x3 nights, then 2 puffs every 4 hours as needed for coughing spell, wheezing, shortness of breath.   The standardized cough guidelines published in Chest by Lissa Morales in 2006 are still the best available and consist of a multiple step process (up to 12!) , not a single office visit,  and are intended  to address this problem logically,  with an alogrithm dependent on response to empiric treatment at  each progressive step  to determine a specific diagnosis with  minimal addtional testing needed. Therefore if adherence is an issue or can't be accurately verified,  it's very unlikely the standard evaluation and treatment will be successful here.    Furthermore, response to therapy (other than acute cough suppression, which should only be used short term with avoidance of narcotic containing cough syrups if possible), can be a gradual process for which the patient may perceive immediate benefit.  Unlike going to an eye doctor where the best perscription is almost always the first one and is immediately effective, this is almost never the case in the management of chronic cough syndromes. Therefore the patient needs to commit up front to consistently adhere to recommendations  for up to 6 weeks of therapy directed at the likely underlying problem(s) before the response can be reasonably evaluated.

## 2014-01-21 NOTE — Patient Instructions (Addendum)
We will schedule you for a walk and breathing test. We will schedule follow-up in 1 month after test. Please check your pharmacy for prescriptions. Take Omeprazole in the evenings.

## 2014-01-22 ENCOUNTER — Telehealth: Payer: Self-pay | Admitting: Internal Medicine

## 2014-01-22 NOTE — Addendum Note (Signed)
Addended by: Devona Konig on: 01/22/2014 08:40 AM   Modules accepted: Orders

## 2014-01-22 NOTE — Telephone Encounter (Signed)
Left message for pt to call back. We need to schedule PFT and SMW in [place].

## 2014-02-03 NOTE — Telephone Encounter (Signed)
Left message for pt to call back x2

## 2014-02-20 NOTE — Telephone Encounter (Signed)
LMTC x 1  

## 2014-02-25 NOTE — Telephone Encounter (Signed)
We contacted the pt several times with no response back. Will close message at this time.

## 2014-03-13 ENCOUNTER — Encounter: Payer: Self-pay | Admitting: Internal Medicine

## 2014-03-13 ENCOUNTER — Ambulatory Visit (INDEPENDENT_AMBULATORY_CARE_PROVIDER_SITE_OTHER): Payer: 59 | Admitting: Internal Medicine

## 2014-03-13 VITALS — BP 150/80 | HR 76 | Temp 98.7°F | Ht 67.0 in | Wt 220.8 lb

## 2014-03-13 DIAGNOSIS — R062 Wheezing: Secondary | ICD-10-CM

## 2014-03-13 DIAGNOSIS — R05 Cough: Secondary | ICD-10-CM | POA: Diagnosis not present

## 2014-03-13 DIAGNOSIS — R059 Cough, unspecified: Secondary | ICD-10-CM

## 2014-03-13 MED ORDER — OMEPRAZOLE 40 MG PO CPDR: 40.0000 mg | DELAYED_RELEASE_CAPSULE | Freq: Every day | ORAL | Status: DC

## 2014-03-13 MED ORDER — ALBUTEROL SULFATE HFA 108 (90 BASE) MCG/ACT IN AERS: INHALATION_SPRAY | RESPIRATORY_TRACT | Status: DC

## 2014-03-13 NOTE — Assessment & Plan Note (Signed)
Multifactorial: Postinfectious, GERD, allergic component Now improving   Plan: - Given history and physical examination, this cough seems most likely to be a combination of GERD with postinfectious (most likely viral) etiology. - Further evaluation with pulmonary function testing in 6 to walk test. - omeprazole 40 mg at night - albuterol 2 puffs every 4 hours as needed for coughing spell, wheezing, shortness of breath.   The standardized cough guidelines published in Chest by Lissa Morales in 2006 are still the best available and consist of a multiple step process (up to 12!) , not a single office visit,  and are intended  to address this problem logically,  with an alogrithm dependent on response to empiric treatment at  each progressive step  to determine a specific diagnosis with  minimal addtional testing needed. Therefore if adherence is an issue or can't be accurately verified,  it's very unlikely the standard evaluation and treatment will be successful here.    Furthermore, response to therapy (other than acute cough suppression, which should only be used short term with avoidance of narcotic containing cough syrups if possible), can be a gradual process for which the patient may perceive immediate benefit.  Unlike going to an eye doctor where the best perscription is almost always the first one and is immediately effective, this is almost never the case in the management of chronic cough syndromes. Therefore the patient needs to commit up front to consistently adhere to recommendations  for up to 6 weeks of therapy directed at the likely underlying problem(s) before the response can be reasonably evaluated.

## 2014-03-13 NOTE — Progress Notes (Signed)
MRN# [id number] Cheryl Barr 1955-02-23   CC:" Follow-up of my cough" Chief Complaint  Patient presents with  . Follow-up    pt here to f/u PFT. Pt had not had pft/smw scheduled prior to this appt. Pt states she is doing well.      Brief History: HPI 01/21/14 Patient is a pleasant 59 year old female past medical history significant for hypertension, vitamin D deficiency, osteoarthritis, obesity, referred by her primary care physician ([facility]) for recurrent cough over the past 3 months. Patient had stated that she had a "bad cold 3 months ago," it lasted about month; Symptoms cough, productive (thick and white), runny nose - treated with cough syrup and inhalers, antibiotics. Since then patient has had a persistent cough, small bouts, with no wheezes, dry cough, sounds like throat clearing.  Worst at night when lying down and in the mornings, occasional sour burps at night. Patient that the cough is reducing, but not completely resolving itself.currently, patient states the cough is nonproductive, and feels like "I have to clear my throat all the time ." She also stated that every year she gets a cold/cough from fall into winter.  PLAN - GERD- one month PPI trial, pfts, 37mwt, albuterol prn inhaler.    Events since last clinic visit: Patient presents today for a follow up of chronic cough, "tickling sensation." At her last visit she was given a trial of PPI, which she states helped with her cough. She still has a mild cough, but has also run out of PPI tabs. She has been using her albuterol inhaler 2 times per week, mostly at night for cough. No sob, no wheezing.   Patient was supposed to have a pft and 6 mwt done prior the visit, but she was told that she had a follow up visit, that these examinations were not completed prior to today's visit.    PMHX:   Past Medical History  Diagnosis Date  . Hyperlipidemia   . Hypertension   . Diabetes mellitus     Type 2  .  Vitamin D deficiency   . Insomnia   . Depression   . Menopause   . Fibromyalgia   . Arthritis   . Colon polyp 2009   Surgical Hx:  Past Surgical History  Procedure Laterality Date  . Colonoscopy  2009    1 polyp found. Dr. Roselyn Reef  . Breast surgery Left 2015   Family Hx:  Family History  Problem Relation Age of Onset  . Hypertension Mother   . Diabetes Mother   . Heart attack Father   . Coronary artery disease Father   . Cancer Father 71    breast   Social Hx:   History  Substance Use Topics  . Smoking status: Never Smoker   . Smokeless tobacco: Never Used     Comment: Tobacco use-no  . Alcohol Use: No   Medication:   Current Outpatient Rx  Name  Route  Sig  Dispense  Refill  . albuterol (PROVENTIL HFA;VENTOLIN HFA) 108 (90 BASE) MCG/ACT inhaler      Inhale 2 puffs for 3 nights then inhale every 4 hours as needed.   1 Inhaler   2   . amLODipine-valsartan (EXFORGE) 10-320 MG per tablet   Oral   Take 1 tablet by mouth daily.         Marland Kitchen aspirin 81 MG EC tablet   Oral   Take 81 mg by mouth daily.           Marland Kitchen  atorvastatin (LIPITOR) 40 MG tablet   Oral   Take 40 mg by mouth daily.         . carvedilol (COREG) 6.25 MG tablet   Oral   Take 6.25 mg by mouth 2 (two) times daily with a meal.         . metFORMIN (GLUCOPHAGE) 1000 MG tablet   Oral   Take 1,000 mg by mouth 2 (two) times daily with a meal.         . omeprazole (PRILOSEC) 40 MG capsule   Oral   Take 1 capsule (40 mg total) by mouth daily. Take in th  Evenings.   30 capsule   1      Review of Systems: Gen:  Denies  fever, sweats, chills HEENT: Denies blurred vision, double vision, ear pain, eye pain, hearing loss, nose bleeds, sore throat Cvc:  No dizziness, chest pain or heaviness Resp:  Cough (mild), non productive Gi: Denies swallowing difficulty, stomach pain, nausea or vomiting, diarrhea, constipation, bowel incontinence Gu:  Denies bladder incontinence, burning urine Ext:   No  Joint pain, stiffness or swelling Skin: No skin rash, easy bruising or bleeding or hives Endoc:  No polyuria, polydipsia , polyphagia or weight change Psych: No depression, insomnia or hallucinations  Other:  All other systems negative  Allergies:  Review of patient's allergies indicates no known allergies.  Physical Examination:  VS: BP 150/80 mmHg  Temp(Src) 98.7 F (37.1 C) (Oral)  Ht 5\' 7"  (1.702 m)  Wt 220 lb 12.8 oz (100.154 kg)  BMI 34.57 kg/m2  General Appearance: No distress  Neuro: EXAM: without focal findings, mental status, speech normal, alert and oriented, cranial nerves 2-12 grossly normal  HEENT: PERRLA, EOM intact, no ptosis, no other lesions noticed Pulmonary:Exam: normal breath sounds., diaphragmatic excursion normal.No wheezing, No rales   Cardiovascular:@ Exam:  Normal S1,S2.  No m/r/g.     Abdomen:Exam: Benign, Soft, non-tender, No masses  Skin:   warm, no rashes, no ecchymosis  Extremities: normal, no cyanosis, clubbing, no edema, warm with normal capillary refill.   Labs results:  BMP No results found for: NA, K, CL, CO2, GLUCOSE, BUN, CREATININE   CBC No flowsheet data found.   Assessment and Plan: Cough Multifactorial: Postinfectious, GERD, allergic component Now improving   Plan: - Given history and physical examination, this cough seems most likely to be a combination of GERD with postinfectious (most likely viral) etiology. - Further evaluation with pulmonary function testing in 6 to walk test. - omeprazole 40 mg at night - albuterol 2 puffs every 4 hours as needed for coughing spell, wheezing, shortness of breath.   The standardized cough guidelines published in Chest by Lissa Morales in 2006 are still the best available and consist of a multiple step process (up to 12!) , not a single office visit,  and are intended  to address this problem logically,  with an alogrithm dependent on response to empiric treatment at  each progressive step  to  determine a specific diagnosis with  minimal addtional testing needed. Therefore if adherence is an issue or can't be accurately verified,  it's very unlikely the standard evaluation and treatment will be successful here.    Furthermore, response to therapy (other than acute cough suppression, which should only be used short term with avoidance of narcotic containing cough syrups if possible), can be a gradual process for which the patient may perceive immediate benefit.  Unlike going to an eye doctor where the  best perscription is almost always the first one and is immediately effective, this is almost never the case in the management of chronic cough syndromes. Therefore the patient needs to commit up front to consistently adhere to recommendations  for up to 6 weeks of therapy directed at the likely underlying problem(s) before the response can be reasonably evaluated.        Updated Medication List Outpatient Encounter Prescriptions as of 03/13/2014  Medication Sig  . albuterol (PROVENTIL HFA;VENTOLIN HFA) 108 (90 BASE) MCG/ACT inhaler Inhale 2 puffs for 3 nights then inhale every 4 hours as needed.  Marland Kitchen amLODipine-valsartan (EXFORGE) 10-320 MG per tablet Take 1 tablet by mouth daily.  Marland Kitchen aspirin 81 MG EC tablet Take 81 mg by mouth daily.    Marland Kitchen atorvastatin (LIPITOR) 40 MG tablet Take 40 mg by mouth daily.  . carvedilol (COREG) 6.25 MG tablet Take 6.25 mg by mouth 2 (two) times daily with a meal.  . metFORMIN (GLUCOPHAGE) 1000 MG tablet Take 1,000 mg by mouth 2 (two) times daily with a meal.  . omeprazole (PRILOSEC) 40 MG capsule Take 1 capsule (40 mg total) by mouth daily. Take in th  Evenings.  . [DISCONTINUED] albuterol (PROVENTIL HFA;VENTOLIN HFA) 108 (90 BASE) MCG/ACT inhaler Inhale 2 puffs into the lungs every 6 (six) hours as needed for wheezing or shortness of breath (inhale 2 pufts every night for 3 nights then every 6 hours prn).    Orders for this visit: No orders of the defined  types were placed in this encounter.    Thank  you for the visitation and for allowing  [facility] to assist in the care of your patient. Our recommendations are noted above.  Please contact us if we can be of further service.  Vilinda Boehringer, MD [facility] Office Number: [phone]

## 2014-03-13 NOTE — Patient Instructions (Signed)
Follow up with Dr. Stevenson Clinch in 3 months  Cough - now resolving, most likely a combination of recent infection and GERD (Acid reflux) - pulmonary function test and 6 min walk test prior to next visit - omeprazole (acid reflux pill) - 1 tab nightly - albuterol inhaler (1-2 puff every 4 hours as need only for coughing spell/shortness of breath/wheezing - exercise and healthy diet

## 2014-03-14 ENCOUNTER — Telehealth: Payer: Self-pay | Admitting: Internal Medicine

## 2014-03-14 NOTE — Telephone Encounter (Signed)
Patient Instructions     Follow up with Dr. Stevenson Clinch in 3 months  Cough - now resolving, most likely a combination of recent infection and GERD (Acid reflux) - pulmonary function test and 6 min walk test prior to next visit - omeprazole (acid reflux pill) - 1 tab nightly - albuterol inhaler (1-2 puff every 4 hours as need only for coughing spell/shortness of breath/wheezing - exercise and healthy diet   Pharmacist at [facility] states that she does not understand the instructions given for albuterol, wanted clarification that instructions are correct.   albuterol (PROVENTIL HFA;VENTOLIN HFA) 108 (90 BASE) MCG/ACT inhaler [id number]      Order Details    Dose, Route, Frequency: As Directed    Dispense Quantity:  1 Inhaler Refills:  2 Fills Remaining:  2          Sig: Inhale 2 puffs for 3 nights then inhale every 4 hours as needed.      Verified instructions given for ALbuterol HFA  use. Nothing further needed.

## 2014-03-18 ENCOUNTER — Other Ambulatory Visit: Payer: Self-pay | Admitting: *Deleted

## 2014-03-18 DIAGNOSIS — R059 Cough, unspecified: Secondary | ICD-10-CM

## 2014-03-18 DIAGNOSIS — R05 Cough: Secondary | ICD-10-CM

## 2014-03-18 DIAGNOSIS — R062 Wheezing: Secondary | ICD-10-CM

## 2014-03-18 MED ORDER — ALBUTEROL SULFATE HFA 108 (90 BASE) MCG/ACT IN AERS: INHALATION_SPRAY | RESPIRATORY_TRACT | Status: DC

## 2014-04-10 LAB — HEMOGLOBIN A1C: HEMOGLOBIN A1C: 6.7 % — AB (ref 4.0–6.0)

## 2014-04-10 LAB — LIPID PANEL
CHOLESTEROL: 142 mg/dL (ref 0–200)
HDL: 46 mg/dL (ref 35–70)
LDL Cholesterol: 58 mg/dL
Triglycerides: 192 mg/dL — AB (ref 40–160)

## 2014-04-29 ENCOUNTER — Telehealth: Payer: Self-pay | Admitting: *Deleted

## 2014-04-29 NOTE — Telephone Encounter (Signed)
Since template change we moved SMW/PFT. SMW now at 1:30, PFT 2:00 pm.

## 2014-05-06 ENCOUNTER — Emergency Department: Payer: Self-pay | Admitting: Emergency Medicine

## 2014-05-28 ENCOUNTER — Ambulatory Visit: Payer: 59

## 2014-05-28 ENCOUNTER — Ambulatory Visit (INDEPENDENT_AMBULATORY_CARE_PROVIDER_SITE_OTHER): Payer: Commercial Managed Care - PPO | Admitting: Internal Medicine

## 2014-05-28 ENCOUNTER — Encounter: Payer: Self-pay | Admitting: Internal Medicine

## 2014-05-28 VITALS — BP 148/86 | HR 85 | Ht 68.0 in | Wt 218.0 lb

## 2014-05-28 DIAGNOSIS — R059 Cough, unspecified: Secondary | ICD-10-CM

## 2014-05-28 DIAGNOSIS — R05 Cough: Secondary | ICD-10-CM | POA: Diagnosis not present

## 2014-05-28 DIAGNOSIS — R062 Wheezing: Secondary | ICD-10-CM

## 2014-05-28 LAB — PULMONARY FUNCTION TEST
DL/VA % PRED: 98 %
DL/VA: 5.15 ml/min/mmHg/L
DLCO UNC % PRED: 96 %
DLCO unc: 28.7 ml/min/mmHg
FEF 25-75 Post: 2.41 L/sec
FEF 25-75 Pre: 2.66 L/sec
FEF2575-%CHANGE-POST: -9 %
FEF2575-%PRED-POST: 99 %
FEF2575-%Pred-Pre: 110 %
FEV1-%Change-Post: 0 %
FEV1-%PRED-POST: 93 %
FEV1-%Pred-Pre: 93 %
FEV1-Post: 2.32 L
FEV1-Pre: 2.32 L
FEV1FVC-%Change-Post: 0 %
FEV1FVC-%PRED-PRE: 104 %
FEV6-%Change-Post: 0 %
FEV6-%Pred-Post: 90 %
FEV6-%Pred-Pre: 90 %
FEV6-POST: 2.77 L
FEV6-Pre: 2.77 L
FEV6FVC-%PRED-POST: 103 %
FEV6FVC-%PRED-PRE: 103 %
FVC-%Change-Post: 0 %
FVC-%PRED-POST: 87 %
FVC-%PRED-PRE: 87 %
FVC-PRE: 2.77 L
FVC-Post: 2.77 L
PRE FEV1/FVC RATIO: 84 %
PRE FEV6/FVC RATIO: 100 %
Post FEV1/FVC ratio: 84 %
Post FEV6/FVC ratio: 100 %
RV % pred: 103 %
RV: 2.25 L
TLC % PRED: 85 %
TLC: 4.85 L

## 2014-05-28 MED ORDER — BECLOMETHASONE DIPROPIONATE 80 MCG/ACT IN AERS: 1.0000 | INHALATION_SPRAY | Freq: Every day | RESPIRATORY_TRACT | Status: DC

## 2014-05-28 NOTE — Progress Notes (Signed)
MRN# [id number] Cheryl Barr 04/03/1955   CC: Chief Complaint  Patient presents with  . Follow-up    PFT results; cough, prod only in am first thing      Brief History: HPI 01/21/14 Patient is a pleasant 59 year old female past medical history significant for hypertension, vitamin D deficiency, osteoarthritis, obesity, referred by her primary care physician ([facility]) for recurrent cough over the past 3 months. Patient had stated that she had a "bad cold 3 months ago," it lasted about month; Symptoms cough, productive (thick and white), runny nose - treated with cough syrup and inhalers, antibiotics. Since then patient has had a persistent cough, small bouts, with no wheezes, dry cough, sounds like throat clearing.  Worst at night when lying down and in the mornings, occasional sour burps at night. Patient that the cough is reducing, but not completely resolving itself.currently, patient states the cough is nonproductive, and feels like "I have to clear my throat all the time ." She also stated that every year she gets a cold/cough from fall into winter.  PLAN - GERD- one month PPI trial, pfts, 52mwt, albuterol prn inhaler.    ROV 03/13/14 Patient presents today for a follow up of chronic cough, "tickling sensation." At her last visit she was given a trial of PPI, which she states helped with her cough. She still has a mild cough, but has also run out of PPI tabs. She has been using her albuterol inhaler 2 times per week, mostly at night for cough. No sob, no wheezing.  Patient was supposed to have a pft and 6 mwt done prior the visit, but she was told that she had a follow up visit, that these examinations were not completed prior to today's visit.  Plan - pfts, 71mwt, ppi trial, albuterol PRN   Events since last clinic visit: She presents today for follow-up evaluation of chronic cough, now going on for the past 6 months. At her last visit she was given a trial of  omeprazole 40 mg, and albuterol when necessary. Since her last visit she states that she cannot tolerate omeprazole, she keeps vomiting it after taking it and has since stopped omeprazole. Patient states that she's been using albuterol scheduled every morning which has not been helping with her cough. Today she describes her cough as "tickle in throat" which is intermittent thick white production or clear sputum production, usually only in the mornings and occasionally during daytimes, no triggers could be identified. Today she had her pulmonary function test done a 6 minute walk test on which she would like to discuss      PMHX:   Past Medical History  Diagnosis Date  . Hyperlipidemia   . Hypertension   . Diabetes mellitus     Type 2  . Vitamin D deficiency   . Insomnia   . Depression   . Menopause   . Fibromyalgia   . Arthritis   . Colon polyp 2009   Surgical Hx:  Past Surgical History  Procedure Laterality Date  . Colonoscopy  2009    1 polyp found. Dr. Roselyn Reef  . Breast surgery Left 2015   Family Hx:  Family History  Problem Relation Age of Onset  . Hypertension Mother   . Diabetes Mother   . Heart attack Father   . Coronary artery disease Father   . Cancer Father 28    breast   Social Hx:   History  Substance Use Topics  . Smoking status:  Never Smoker   . Smokeless tobacco: Never Used     Comment: Tobacco use-no  . Alcohol Use: No   Medication:   Current Outpatient Rx  Name  Route  Sig  Dispense  Refill  . albuterol (PROVENTIL HFA;VENTOLIN HFA) 108 (90 BASE) MCG/ACT inhaler      Inhale 2 puffs for 3 nights then inhale 2 puffs every 4 hours as needed.   1 Inhaler   2   . amLODipine-valsartan (EXFORGE) 10-320 MG per tablet   Oral   Take 1 tablet by mouth daily.         Marland Kitchen aspirin 81 MG EC tablet   Oral   Take 81 mg by mouth daily.           Marland Kitchen atorvastatin (LIPITOR) 40 MG tablet   Oral   Take 40 mg by mouth daily.         . carvedilol (COREG)  6.25 MG tablet   Oral   Take 6.25 mg by mouth 2 (two) times daily with a meal.         . metFORMIN (GLUCOPHAGE) 1000 MG tablet   Oral   Take 1,000 mg by mouth 2 (two) times daily with a meal.         . omeprazole (PRILOSEC) 40 MG capsule   Oral   Take 1 capsule (40 mg total) by mouth daily. Take in th  Evenings.   30 capsule   3      Review of Systems: Gen:  Denies  fever, sweats, chills HEENT: Denies blurred vision, double vision, ear pain, eye pain, hearing loss, nose bleeds, sore throat Cvc:  No dizziness, chest pain or heaviness Resp:   The past shortness of breath, dyspnea at rest. Admits to mild intermittent morning cough. Cough with mild sputum production Gi: Denies swallowing difficulty, stomach pain, nausea or vomiting, diarrhea, constipation, bowel incontinence Gu:  Denies bladder incontinence, burning urine Ext:   No Joint pain, stiffness or swelling Skin: No skin rash, easy bruising or bleeding or hives Endoc:  No polyuria, polydipsia , polyphagia or weight change Psych: No depression, insomnia or hallucinations  Other:  All other systems negative  Allergies:  Review of patient's allergies indicates no known allergies.  Physical Examination:  VS: BP 148/86 mmHg  Pulse 85  Ht 5\' 8"  (1.727 m)  Wt 218 lb (98.884 kg)  BMI 33.15 kg/m2  SpO2 99%  General Appearance: No distress  HEENT: PERRLA, EOM intact, no ptosis, no other lesions noticed Pulmonary:Exam: normal breath sounds., diaphragmatic excursion normal.No wheezing, No rales   Cardiovascular:@ Exam:  Normal S1,S2.  No m/r/g.     Abdomen:Exam: Benign, Soft, non-tender, No masses  Skin:   warm, no rashes, no ecchymosis  Extremities: normal, no cyanosis, clubbing, no edema, warm with normal capillary refill.   Labs results:  BMP No results found for: NA, K, CL, CO2, GLUCOSE, BUN, CREATININE   CBC No flowsheet data found.  Primary function testing 05/28/2014 FVC 87% FEV193% FEV1/FVC 84 RV 103 TLC  85 RV/TLC 118% ERV 17 DLCO 96% Impression no significant obstruction or restriction noted, normal DLCO, severely decreased ERV (possible due to central obesity, overweight).  Overall normal PFTs  6 min walk tests-312 m/1023 feet, no desaturations less than 88%, max heart rate 94.     Assessment and Plan: 59 year old female seen in consultation for follow-up visit of chronic cough since fall of 2015. Cough Multifactorial: Postinfectious, GERD, allergic component or possible asthma-like reaction/bronchospasms Now  improving but not resolving leftover with a morning and sometimes daily cough   Plan: - Possible asthma-like reaction/bronchospasms, has tried omeprazole and albuterol with little relief. - Will give trial of Qvar 80 mg 1 puff daily in the mornings - Patient with essentially normal pulmonary function testing and normal 6 minute walk test   The standardized cough guidelines published in Chest by Lissa Morales in 2006 are still the best available and consist of a multiple step process (up to 12!) , not a single office visit,  and are intended  to address this problem logically,  with an alogrithm dependent on response to empiric treatment at  each progressive step  to determine a specific diagnosis with  minimal addtional testing needed. Therefore if adherence is an issue or can't be accurately verified,  it's very unlikely the standard evaluation and treatment will be successful here.    Furthermore, response to therapy (other than acute cough suppression, which should only be used short term with avoidance of narcotic containing cough syrups if possible), can be a gradual process for which the patient may perceive immediate benefit.         Updated Medication List Outpatient Encounter Prescriptions as of 05/28/2014  Medication Sig  . albuterol (PROVENTIL HFA;VENTOLIN HFA) 108 (90 BASE) MCG/ACT inhaler Inhale 2 puffs for 3 nights then inhale 2 puffs every 4 hours as needed.   Marland Kitchen amLODipine-valsartan (EXFORGE) 10-320 MG per tablet Take 1 tablet by mouth daily.  Marland Kitchen aspirin 81 MG EC tablet Take 81 mg by mouth daily.    Marland Kitchen atorvastatin (LIPITOR) 40 MG tablet Take 40 mg by mouth daily.  . carvedilol (COREG) 6.25 MG tablet Take 6.25 mg by mouth 2 (two) times daily with a meal.  . metFORMIN (GLUCOPHAGE) 1000 MG tablet Take 1,000 mg by mouth 2 (two) times daily with a meal.  . omeprazole (PRILOSEC) 40 MG capsule Take 1 capsule (40 mg total) by mouth daily. Take in th  Evenings.    Orders for this visit: No orders of the defined types were placed in this encounter.    Thank  you for the visitation and for allowing  [facility] to assist in the care of your patient. Our recommendations are noted above.  Please contact us if we can be of further service.  Vilinda Boehringer, MD [facility] Office Number: [phone]

## 2014-05-28 NOTE — Progress Notes (Signed)
SMW performed today. 

## 2014-05-28 NOTE — Assessment & Plan Note (Signed)
Multifactorial: Postinfectious, GERD, allergic component or possible asthma-like reaction/bronchospasms Now improving but not resolving leftover with a morning and sometimes daily cough   Plan: - Possible asthma-like reaction/bronchospasms, has tried omeprazole and albuterol with little relief. - Will give trial of Qvar 80 mg 1 puff daily in the mornings - Patient with essentially normal pulmonary function testing and normal 6 minute walk test   The standardized cough guidelines published in Chest by Lissa Morales in 2006 are still the best available and consist of a multiple step process (up to 12!) , not a single office visit,  and are intended  to address this problem logically,  with an alogrithm dependent on response to empiric treatment at  each progressive step  to determine a specific diagnosis with  minimal addtional testing needed. Therefore if adherence is an issue or can't be accurately verified,  it's very unlikely the standard evaluation and treatment will be successful here.    Furthermore, response to therapy (other than acute cough suppression, which should only be used short term with avoidance of narcotic containing cough syrups if possible), can be a gradual process for which the patient may perceive immediate benefit.

## 2014-05-28 NOTE — Patient Instructions (Signed)
Follow up with Dr. Stevenson Clinch in 2 months - will treat you for suspected bronchospams/asthma like reaction - Qvar 15mcg - 1 puff in the morning (2 refills) - gargle and rinse after each use.

## 2014-05-28 NOTE — Progress Notes (Signed)
PFT performed today. 

## 2014-06-07 NOTE — Op Note (Signed)
PATIENT NAME:  Cheryl, Barr MR#:  [id number] DATE OF BIRTH:  Feb 14, 1956  DATE OF PROCEDURE:  06/21/2013  PREOPERATIVE DIAGNOSIS:  Left breast mass.   POSTOPERATIVE DIAGNOSIS:  Left breast mass.  OPERATIVE PROCEDURE:  Excision of left breast mass with ultrasound guidance.   SURGEON: Hervey Ard, M.D.   ANESTHESIA:  General by LMA, Marcaine 0.5% with 1:200,000 units of epinephrine, 30 mL of local infiltration.   ESTIMATED BLOOD LOSS:  Minimal.   CLINICAL NOTE:  This woman was noted to have an abnormal mammogram and ultrasound-guided core biopsy was insufficient and did not correlate with the imaging studies. The patient was felt to be a candidate for excisional biopsy due to the close locations to the skin.   DESCRIPTION OF PROCEDURE:  Put the patient under adequate general anesthesia. The area was prepped with ChloraPrep and draped. Marcaine was infiltrated for postoperative analgesia. Ultrasound was used to confirm the area of ductal dilatation in the 3 o'clock position of the left breast. A radial incision from the base of the nipple at the 3 o'clock position was continued across the areola. The skin was incised sharply and the remaining dissection was completed with electrocautery. Dissection included the tissue beneath the nipple. This was marked with the skin marker. A 3 x 4 x 5 cm block of tissue was excised and specimen radiograph confirmed the previously placed clip was in the specimen. The wound was closed in multiple layers to buttress the volume behind the nipple areolar complex. The skin was closed with interrupted 4-0 Vicryl subcuticular sutures. Benzoin and Steri-Strips were applied. Telfa, fluff gauze, followed by a Kerlix and Ace wrap were applied.   The patient tolerated the procedure well and was taken to the recovery room in stable condition.  ____________________________ Robert Bellow, MD jwb:dmm D: 06/21/2013 21:30:45 ET T: 06/21/2013 22:02:56  ET JOB#: [id number]  cc: Robert Bellow, MD, <Dictator> Bethena Roys. Ancil Boozer, MD Daxx Tiggs Amedeo Kinsman MD ELECTRONICALLY SIGNED 06/25/2013 9:43

## 2014-07-29 ENCOUNTER — Emergency Department
Admission: EM | Admit: 2014-07-29 | Discharge: 2014-07-29 | Disposition: A | Discharge status: Home / Self Care | Payer: Commercial Managed Care - PPO | Attending: Emergency Medicine | Admitting: Emergency Medicine

## 2014-07-29 ENCOUNTER — Encounter: Payer: Self-pay | Admitting: Emergency Medicine

## 2014-07-29 DIAGNOSIS — M109 Gout, unspecified: Secondary | ICD-10-CM

## 2014-07-29 DIAGNOSIS — Z79899 Other long term (current) drug therapy: Secondary | ICD-10-CM | POA: Insufficient documentation

## 2014-07-29 DIAGNOSIS — I10 Essential (primary) hypertension: Secondary | ICD-10-CM | POA: Diagnosis not present

## 2014-07-29 DIAGNOSIS — Z7982 Long term (current) use of aspirin: Secondary | ICD-10-CM | POA: Diagnosis not present

## 2014-07-29 DIAGNOSIS — E119 Type 2 diabetes mellitus without complications: Secondary | ICD-10-CM | POA: Insufficient documentation

## 2014-07-29 DIAGNOSIS — M10032 Idiopathic gout, left wrist: Secondary | ICD-10-CM | POA: Insufficient documentation

## 2014-07-29 DIAGNOSIS — R2232 Localized swelling, mass and lump, left upper limb: Secondary | ICD-10-CM | POA: Diagnosis present

## 2014-07-29 HISTORY — DX: Gout, unspecified: M10.9

## 2014-07-29 MED ORDER — COLCHICINE 0.6 MG PO TABS
ORAL_TABLET | ORAL | Status: AC
Start: 2014-07-29 — End: 2014-07-29
  Administered 2014-07-29: 0.6 mg via ORAL
  Filled 2014-07-29: qty 1

## 2014-07-29 MED ORDER — PREDNISONE 20 MG PO TABS
ORAL_TABLET | ORAL | Status: AC
  Administered 2014-07-29: 20 mg
  Filled 2014-07-29: qty 1

## 2014-07-29 MED ORDER — COLCHICINE 0.6 MG PO TABS
1.2000 mg | ORAL_TABLET | Freq: Once | ORAL | Status: AC
  Administered 2014-07-29: 1.2 mg via ORAL

## 2014-07-29 MED ORDER — COLCHICINE 0.6 MG PO TABS
ORAL_TABLET | ORAL | Status: AC
  Administered 2014-07-29: 1.2 mg via ORAL
  Filled 2014-07-29: qty 2

## 2014-07-29 MED ORDER — PREDNISONE 20 MG PO TABS: 30.0000 mg | ORAL_TABLET | Freq: Once | ORAL | Status: DC

## 2014-07-29 MED ORDER — OXYCODONE HCL 5 MG PO TABS
ORAL_TABLET | ORAL | Status: AC
  Administered 2014-07-29: 5 mg via ORAL
  Filled 2014-07-29: qty 1

## 2014-07-29 MED ORDER — PREDNISONE 10 MG PO TABS
ORAL_TABLET | ORAL | Status: AC
  Administered 2014-07-29: 10 mg
  Filled 2014-07-29: qty 1

## 2014-07-29 MED ORDER — PREDNISONE 20 MG PO TABS: 20.0000 mg | ORAL_TABLET | Freq: Every day | ORAL | Status: AC

## 2014-07-29 MED ORDER — OXYCODONE HCL 5 MG PO TABS
5.0000 mg | ORAL_TABLET | Freq: Once | ORAL | Status: AC
  Administered 2014-07-29: 5 mg via ORAL

## 2014-07-29 MED ORDER — COLCHICINE 0.6 MG PO TABS
0.6000 mg | ORAL_TABLET | Freq: Once | ORAL | Status: AC
  Administered 2014-07-29: 0.6 mg via ORAL

## 2014-07-29 NOTE — ED Notes (Signed)
Pt reports history of gout. Here tonight for swelling in left hand.  Pain with movement of left hand.

## 2014-07-29 NOTE — ED Notes (Signed)
Pt arrived to the ED for complaints of left arm swelling and pain. Pt denies trauma to extremity. Pt reports noticing the pain and swelling 4 days ago and today the swelling and pain became intolerable. Pt reports that she suffers of gout. Sensation and circulation are intact with decreased range of motion on left hand/arm. Pt is AOx4 in no apparent distress.

## 2014-07-29 NOTE — Discharge Instructions (Signed)
Gout °Gout is when your joints become red, sore, and swell (inflamed). This is caused by the buildup of uric acid crystals in the joints. Uric acid is a chemical that is normally in the blood. If the level of uric acid gets too high in the blood, these crystals form in your joints and tissues. Over time, these crystals can form into masses near the joints and tissues. These masses can destroy bone and cause the bone to look misshapen (deformed). °HOME CARE  °· Do not take aspirin for pain. °· Only take medicine as told by your doctor. °· Rest the joint as much as you can. When in bed, keep sheets and blankets off painful areas. °· Keep the sore joints raised (elevated). °· Put warm or cold packs on painful joints. Use of warm or cold packs depends on which works best for you. °· Use crutches if the painful joint is in your leg. °· Drink enough fluids to keep your pee (urine) clear or pale yellow. Limit alcohol, sugary drinks, and drinks with fructose in them. °· Follow your diet instructions. Pay careful attention to how much protein you eat. Include fruits, vegetables, whole grains, and fat-free or low-fat milk products in your daily diet. Talk to your doctor or dietitian about the use of coffee, vitamin C, and cherries. These may help lower uric acid levels. °· Keep a healthy body weight. °GET HELP RIGHT AWAY IF:  °· You have watery poop (diarrhea), throw up (vomit), or have any side effects from medicines. °· You do not feel better in 24 hours, or you are getting worse. °· Your joint becomes suddenly more tender, and you have chills or a fever. °MAKE SURE YOU:  °· Understand these instructions. °· Will watch your condition. °· Will get help right away if you are not doing well or get worse. °Document Released: 11/10/2007 Document Revised: 06/17/2013 Document Reviewed: 09/14/2011 °Exit[employer]® Patient Information ©2015 [employer]. This information is not intended to replace advice given to you by your health care  provider. Make sure you discuss any questions you have with your health care provider. ° °

## 2014-07-29 NOTE — ED Provider Notes (Signed)
[facility] Emergency Department Provider Note ____________________________________________  Time seen: Approximately 8:24 PM  I have reviewed the triage vital signs and the nursing notes.   HISTORY  Chief Complaint Arm Swelling   HPI Cheryl Barr is a 59 y.o. female who presents to the emergency department for acute onset of left wrist pain. She denies any injury. She states that she had stopped taking her daily gout medication week or so ago. She tells me that she's never had gout in the wrist but it feels the same as when she had it in her right hand and big toes. She has not taken anything for pain. Pain worsens with movement or palpation.   Past Medical History  Diagnosis Date  . Hyperlipidemia   . Hypertension   . Diabetes mellitus     Type 2  . Vitamin D deficiency   . Insomnia   . Depression   . Menopause   . Fibromyalgia   . Arthritis   . Colon polyp 2009  . Gout     Patient Active Problem List   Diagnosis Date Noted  . Cough 01/21/2014  . Ventral hernia 07/30/2013  . Intraductal papilloma of breast 06/27/2013  . Breast mass 05/31/2013  . HYPERLIPIDEMIA-MIXED 09/02/2009  . HYPERTENSION, BENIGN 09/02/2009  . ABNORMAL EKG 09/02/2009    Past Surgical History  Procedure Laterality Date  . Colonoscopy  2009    1 polyp found. Dr. Roselyn Reef  . Breast surgery Left 2015    Current Outpatient Rx  Name  Route  Sig  Dispense  Refill  . albuterol (PROVENTIL HFA;VENTOLIN HFA) 108 (90 BASE) MCG/ACT inhaler      Inhale 2 puffs for 3 nights then inhale 2 puffs every 4 hours as needed.   1 Inhaler   2   . amLODipine-valsartan (EXFORGE) 10-320 MG per tablet   Oral   Take 1 tablet by mouth daily.         Marland Kitchen aspirin 81 MG EC tablet   Oral   Take 81 mg by mouth daily.           Marland Kitchen atorvastatin (LIPITOR) 40 MG tablet   Oral   Take 40 mg by mouth daily.         . beclomethasone (QVAR) 80 MCG/ACT inhaler   Inhalation   Inhale 1 puff  into the lungs daily. Rinse and gargle after each use.   1 Inhaler   2   . carvedilol (COREG) 6.25 MG tablet   Oral   Take 6.25 mg by mouth 2 (two) times daily with a meal.         . metFORMIN (GLUCOPHAGE) 1000 MG tablet   Oral   Take 1,000 mg by mouth 2 (two) times daily with a meal.         . omeprazole (PRILOSEC) 40 MG capsule   Oral   Take 1 capsule (40 mg total) by mouth daily. Take in th  Evenings.   30 capsule   3   . predniSONE (DELTASONE) 20 MG tablet   Oral   Take 1 tablet (20 mg total) by mouth daily with breakfast.   6 tablet   0     Allergies Review of patient's allergies indicates no known allergies.  Family History  Problem Relation Age of Onset  . Hypertension Mother   . Diabetes Mother   . Heart attack Father   . Coronary artery disease Father   . Cancer Father 76  breast    Social History History  Substance Use Topics  . Smoking status: Never Smoker   . Smokeless tobacco: Never Used     Comment: Tobacco use-no  . Alcohol Use: No    Review of Systems Constitutional: No recent illness. Eyes: No visual changes. ENT: No sore throat. Cardiovascular: Denies chest pain or palpitations. Respiratory: Denies shortness of breath. Gastrointestinal: No abdominal pain.  Genitourinary: Negative for dysuria. Musculoskeletal: Pain in left wrist. Skin: Negative for rash. Neurological: Negative for headaches, focal weakness or numbness. 10-point ROS otherwise negative.  ____________________________________________   PHYSICAL EXAM:  VITAL SIGNS: ED Triage Vitals  Enc Vitals Group     BP 07/29/14 2013 144/76 mmHg     Pulse Rate 07/29/14 2013 98     Resp 07/29/14 2013 18     Temp 07/29/14 2013 99.5 F (37.5 C)     Temp Source 07/29/14 2013 Oral     SpO2 07/29/14 2013 97 %     Weight 07/29/14 2013 220 lb (99.791 kg)     Height 07/29/14 2013 5\' 7"  (1.702 m)     Head Cir --      Peak Flow --      Pain Score 07/29/14 2014 8     Pain Loc  --      Pain Edu? --      Excl. in [place]? --     Constitutional: Alert and oriented. Well appearing and in no acute distress. Eyes: Conjunctivae are normal. EOMI. Head: Atraumatic. Nose: No congestion/rhinnorhea. Neck: No stridor.  Respiratory: Normal respiratory effort.   Musculoskeletal: Left wrist erythema and mild edema. Neurologic:  Normal speech and language. No gross focal neurologic deficits are appreciated. Speech is normal. No gait instability. Skin:  Skin over painful area on her right wrist has increased in temperature compared to surrounding skin as well as mild erythema. Psychiatric: Mood and affect are normal. Speech and behavior are normal.  ____________________________________________   LABS (all labs ordered are listed, but only abnormal results are displayed)  Labs Reviewed - No data to display ____________________________________________  RADIOLOGY  Not indicated ____________________________________________   PROCEDURES  Procedure(s) performed: None   ____________________________________________   INITIAL IMPRESSION / ASSESSMENT AND PLAN / ED COURSE  Pertinent labs & imaging results that were available during my care of the patient were reviewed by me and considered in my medical decision making (see chart for details).  ----------------------------------------- 9:11 PM on 07/29/2014 -----------------------------------------  Patient reports some improvement of pain after first dose of colchicine and pain medication. We will reduce culture seen approximately one hour post initial dose.  10:22PM  Patient to be discharged home to follow up with primary care. Pain improving. She was advised to return to the ER for symptoms that change or worsen if she is unable to schedule an appointment.  ____________________________________________   FINAL CLINICAL IMPRESSION(S) / ED DIAGNOSES  Final diagnoses:  Acute gout of left wrist, unspecified cause       Victorino Dike, FNP 07/30/14 0017  Harvest Dark, MD 07/30/14 2307

## 2014-08-10 ENCOUNTER — Encounter: Payer: Self-pay | Admitting: Family Medicine

## 2014-08-10 DIAGNOSIS — M1712 Unilateral primary osteoarthritis, left knee: Secondary | ICD-10-CM | POA: Insufficient documentation

## 2014-08-10 DIAGNOSIS — E785 Hyperlipidemia, unspecified: Secondary | ICD-10-CM | POA: Insufficient documentation

## 2014-08-10 DIAGNOSIS — E559 Vitamin D deficiency, unspecified: Secondary | ICD-10-CM | POA: Insufficient documentation

## 2014-08-10 DIAGNOSIS — N289 Disorder of kidney and ureter, unspecified: Secondary | ICD-10-CM | POA: Insufficient documentation

## 2014-08-10 DIAGNOSIS — R809 Proteinuria, unspecified: Secondary | ICD-10-CM | POA: Insufficient documentation

## 2014-08-10 DIAGNOSIS — M109 Gout, unspecified: Secondary | ICD-10-CM | POA: Insufficient documentation

## 2014-08-10 DIAGNOSIS — E1129 Type 2 diabetes mellitus with other diabetic kidney complication: Secondary | ICD-10-CM | POA: Insufficient documentation

## 2014-08-11 ENCOUNTER — Ambulatory Visit (INDEPENDENT_AMBULATORY_CARE_PROVIDER_SITE_OTHER): Payer: Commercial Managed Care - PPO | Admitting: Family Medicine

## 2014-08-11 ENCOUNTER — Encounter: Payer: Self-pay | Admitting: Family Medicine

## 2014-08-11 VITALS — BP 132/86 | HR 92 | Temp 98.2°F | Resp 16 | Ht 67.0 in | Wt 221.8 lb

## 2014-08-11 DIAGNOSIS — N058 Unspecified nephritic syndrome with other morphologic changes: Secondary | ICD-10-CM

## 2014-08-11 DIAGNOSIS — M109 Gout, unspecified: Secondary | ICD-10-CM

## 2014-08-11 DIAGNOSIS — I10 Essential (primary) hypertension: Secondary | ICD-10-CM | POA: Diagnosis not present

## 2014-08-11 DIAGNOSIS — E1129 Type 2 diabetes mellitus with other diabetic kidney complication: Secondary | ICD-10-CM | POA: Diagnosis not present

## 2014-08-11 DIAGNOSIS — E785 Hyperlipidemia, unspecified: Secondary | ICD-10-CM | POA: Diagnosis not present

## 2014-08-11 LAB — POCT GLYCOSYLATED HEMOGLOBIN (HGB A1C): HEMOGLOBIN A1C: 6.6

## 2014-08-11 MED ORDER — AMLODIPINE BESYLATE-VALSARTAN 10-320 MG PO TABS: 1.0000 | ORAL_TABLET | Freq: Every day | ORAL | Status: DC

## 2014-08-11 MED ORDER — ATORVASTATIN CALCIUM 40 MG PO TABS: 40.0000 mg | ORAL_TABLET | Freq: Every day | ORAL | Status: DC

## 2014-08-11 MED ORDER — ALLOPURINOL 100 MG PO TABS: 100.0000 mg | ORAL_TABLET | Freq: Every day | ORAL | Status: DC

## 2014-08-11 MED ORDER — METFORMIN HCL 1000 MG PO TABS: 1000.0000 mg | ORAL_TABLET | Freq: Two times a day (BID) | ORAL | Status: DC

## 2014-08-11 NOTE — Patient Instructions (Signed)

## 2014-08-11 NOTE — Progress Notes (Signed)
Name: Cheryl Barr   MRN: [id number]    DOB: 1955/05/27   Date:08/11/2014       Progress Note  Subjective  Chief Complaint  Chief Complaint  Patient presents with  . Follow-up  . Hypertension    Patient states that she checks BP at home, runs around 130/80  . Diabetes    Checking blood sugars once daily, average of 120-124.  Marland Kitchen Hyperlipidemia    HPI  HTN: she is complaint with her medication, denies side effects. BP is at goal  Hyperlipidemia: last LDL was at goal, continue high dose statin therapy, denies side effects of medication and is compliant with medications  DMII with microalbuminuria: urine micro was 100 in Feb 2016, she is on ARB - valsartan 320mg  , aspirin, and metfomin. She denies side effects, hgbA1C has been at goal. Eye exam is up to date. No polyphagia, , polydipsia or polyuria.   Gout: symptoms were under control for years, but she ran out of Allopurinol for a few weeks and had a severe gout episode on left wrist that made her go to [facility]. She was given prednisone for 3 days and symptoms resolved. She is back on Allopurinol now . Doing well at this time.  Cough: went to [facility] pulmonologist. Unknown cause of cough, was given Qvar and Proventil, but she states symptoms resolved and she never got the Qvar filled. Asymptomatic at this time.  Patient Active Problem List   Diagnosis Date Noted  . Impaired renal function 08/10/2014  . 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 Dyslipidemia 08/10/2014  . Interval gout 08/10/2014  . Microalbuminuria 08/10/2014  . Obesity (BMI 30.0-34.9) 08/10/2014  . Arthritis, degenerative 08/10/2014  . Vitamin D deficiency 08/10/2014  . Ventral hernia 07/30/2013  . Intraductal papilloma of breast 06/27/2013  . Hypertension, essential, benign 09/02/2009  . ABNORMAL EKG 09/02/2009    Past Surgical History  Procedure Laterality Date  . Colonoscopy  2009    1 polyp found. Dr. Roselyn Reef  . Breast surgery  Left 2015  . Strabismus surgery      Repair as a child  . Knee surgery Left 01/14/2009  . Tubal ligation  1990    Family History  Problem Relation Age of Onset  . Hypertension Mother   . Diabetes Mother   . Heart attack Father   . Coronary artery disease Father   . Breast cancer Father 25  . Allergies Son     Barista  . Diabetes Sister   . Diabetes Brother   . Gout Brother     History   Social History  . Marital Status: Widowed    Spouse Name: N/A  . Number of Children: 3  . Years of Education: 12th Grade   Occupational History  .  [employer]   Social History Main Topics  . Smoking status: Former Smoker    Types: Cigarettes  . Smokeless tobacco: Never Used  . Alcohol Use: No  . Drug Use: No  . Sexual Activity: Yes   Other Topics Concern  . Not on file   Social History Narrative   Widowed.    No regular exercise.     Current outpatient prescriptions:  .  albuterol (PROVENTIL HFA;VENTOLIN HFA) 108 (90 BASE) MCG/ACT inhaler, Inhale 2 puffs for 3 nights then inhale 2 puffs every 4 hours as needed., Disp: 1 Inhaler, Rfl: 2 .  allopurinol (ZYLOPRIM) 100 MG tablet, Take 1 tablet (100 mg  total) by mouth daily., Disp: 90 tablet, Rfl: 1 .  amLODipine-valsartan (EXFORGE) 10-320 MG per tablet, Take 1 tablet by mouth daily., Disp: 90 tablet, Rfl: 1 .  aspirin 81 MG EC tablet, Take 81 mg by mouth daily.  , Disp: , Rfl:  .  atorvastatin (LIPITOR) 40 MG tablet, Take 1 tablet (40 mg total) by mouth daily., Disp: 90 tablet, Rfl: 1 .  glucose blood (FREESTYLE LITE) test strip, FREESTYLE LITE TEST (In Vitro Strip)  check fsbs once a day for 0 days  Quantity: 100.00;  Refills: 5   Ordered :31-Dec-2009  Steele Sizer MD;  Dani Gobble Active Comments: DX: 250.00, Disp: , Rfl:  .  metFORMIN (GLUCOPHAGE) 1000 MG tablet, Take 1 tablet (1,000 mg total) by mouth 2 (two) times daily with a meal., Disp: 180 tablet, Rfl: 1 .  Multiple Vitamins-Minerals (CENTRUM SILVER  ULTRA WOMENS) TABS, Take 1 tablet by mouth daily., Disp: , Rfl:  .  carvedilol (COREG) 6.25 MG tablet, Take 6.25 mg by mouth 2 (two) times daily with a meal., Disp: , Rfl:   No Known Allergies   ROS  Constitutional: Negative for fever or significant weight change.  Respiratory: Negative for cough and shortness of breath.   Cardiovascular: Negative for chest pain or palpitations.  Gastrointestinal: Negative for abdominal pain, no bowel changes.  Musculoskeletal: Negative for gait problem or joint swelling.  Skin: Negative for rash.  Neurological: Negative for dizziness or headache.  No other specific complaints in a complete review of systems (except as listed in HPI above).   Objective  Filed Vitals:   08/11/14 1524  BP: 132/86  Pulse: 92  Temp: 98.2 F (36.8 C)  TempSrc: Oral  Resp: 16  Height: 5\' 7"  (1.702 m)  Weight: 221 lb 12.8 oz (100.608 kg)  SpO2: 97%    Body mass index is 34.73 kg/(m^2).  Physical Exam  Constitutional: Patient appears well-developed and well-nourished. No distress.  Eyes:  No scleral icterus. PERL Oral mucosa moist, poor dentition.  Neck: Normal range of motion. Neck supple. Cardiovascular: Normal rate, regular rhythm and normal heart sounds.  No murmur heard. No BLE edema. Pulmonary/Chest: Effort normal and breath sounds normal. No respiratory distress. Abdominal: Soft.  There is no tenderness. Psychiatric: Patient has a normal mood and affect. behavior is normal. Judgment and thought content normal. Neuro: normal monofilament both feet  Recent Results (from the past 2160 hour(s))  Pulmonary function test     Status: None (Preliminary result)   Collection Time: 05/28/14  1:43 PM  Result Value Ref Range   FVC-Pre 2.77 L   FVC-%Pred-Pre 87 %   FVC-Post 2.77 L   FVC-%Pred-Post 87 %   FVC-%Change-Post 0 %   FEV1-Pre 2.32 L   FEV1-%Pred-Pre 93 %   FEV1-Post 2.32 L   FEV1-%Pred-Post 93 %   FEV1-%Change-Post 0 %   FEV6-Pre 2.77 L    FEV6-%Pred-Pre 90 %   FEV6-Post 2.77 L   FEV6-%Pred-Post 90 %   FEV6-%Change-Post 0 %   Pre FEV1/FVC ratio 84 %   FEV1FVC-%Pred-Pre 104 %   Post FEV1/FVC ratio 84 %   FEV1FVC-%Change-Post 0 %   Pre FEV6/FVC Ratio 100 %   FEV6FVC-%Pred-Pre 103 %   Post FEV6/FVC ratio 100 %   FEV6FVC-%Pred-Post 103 %   FEF 25-75 Pre 2.66 L/sec   FEF2575-%Pred-Pre 110 %   FEF 25-75 Post 2.41 L/sec   FEF2575-%Pred-Post 99 %   FEF2575-%Change-Post -9 %   RV 2.25 L  RV % pred 103 %   TLC 4.85 L   TLC % pred 85 %   DLCO unc 28.70 ml/min/mmHg   DLCO unc % pred 96 %   DL/VA 5.15 ml/min/mmHg/L   DL/VA % pred 98 %     Diabetic Foot Exam - Simple   Simple Foot Form  Visual Inspection  No deformities, no ulcerations, no other skin breakdown bilaterally:  Yes  Sensation Testing  Intact to touch and monofilament testing bilaterally:  Yes  Pulse Check  Posterior Tibialis and Dorsalis pulse intact bilaterally:  Yes  Comments     PHQ2/9: Depression screen PHQ 2/9 08/11/2014  Decreased Interest 0  Down, Depressed, Hopeless 0  PHQ - 2 Score 0     Fall Risk: Fall Risk  08/11/2014  Falls in the past year? No     Assessment & Plan  1. 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  Dispense: 180 tablet; Refill: 1  2. Dyslipidemia At goal  - atorvastatin (LIPITOR) 40 MG tablet; Take 1 tablet (40 mg total) by mouth daily.  Dispense: 90 tablet; Refill: 1  3. Interval gout Back on allopurinol  - allopurinol (ZYLOPRIM) 100 MG tablet; Take 1 tablet (100 mg total) by mouth daily.  Dispense: 90 tablet; Refill: 1  4. Hypertension, essential, benign At goal, continue medication - amLODipine-valsartan (EXFORGE) 10-320 MG per tablet; Take 1 tablet by mouth daily.  Dispense: 90 tablet; Refill: 1

## 2014-08-11 NOTE — Addendum Note (Signed)
Addended by: Gerald Leitz A on: 08/11/2014 03:57 PM   Modules accepted: Orders

## 2014-09-15 ENCOUNTER — Encounter: Payer: Self-pay | Admitting: General Surgery

## 2014-09-15 ENCOUNTER — Ambulatory Visit (INDEPENDENT_AMBULATORY_CARE_PROVIDER_SITE_OTHER): Payer: Commercial Managed Care - PPO | Admitting: General Surgery

## 2014-09-15 VITALS — BP 130/76 | HR 78 | Resp 14 | Ht 67.0 in | Wt 220.0 lb

## 2014-09-15 DIAGNOSIS — K439 Ventral hernia without obstruction or gangrene: Secondary | ICD-10-CM | POA: Diagnosis not present

## 2014-09-15 DIAGNOSIS — K469 Unspecified abdominal hernia without obstruction or gangrene: Secondary | ICD-10-CM | POA: Insufficient documentation

## 2014-09-15 NOTE — Progress Notes (Signed)
Patient ID: Cheryl Barr, female   DOB: [birth year], 59 y.o.   MRN: [id number]  Chief Complaint  Patient presents with  . Follow-up    ventral hernia    HPI Cheryl Barr is a 59 y.o. female for follow up and reevaluation of a ventral hernia. Patient states the area has been hurting more in the last month with movement and lifting.  The patient developed an upper respiratory infection in late 2015. Since then she's had significant cough. She's been evaluated by pulmonary medicine without clear etiology identified. She's had more discomfort in the ventral hernia site since her URI. No obstructive symptoms. HPI  Past Medical History  Diagnosis Date  . Hyperlipidemia   . Hypertension   . Diabetes mellitus     Type 2  . Vitamin D deficiency   . Insomnia   . Depression   . Menopause   . Fibromyalgia   . Arthritis   . Colon polyp 2009  . Gout     Past Surgical History  Procedure Laterality Date  . Colonoscopy  2009    1 polyp found. Dr. Roselyn Reef  . Breast surgery Left 2015  . Strabismus surgery      Repair as a child  . Knee surgery Left 01/14/2009  . Tubal ligation  1990    Family History  Problem Relation Age of Onset  . Hypertension Mother   . Diabetes Mother   . Heart attack Father   . Coronary artery disease Father   . Breast cancer Father 16  . Allergies Son     Barista  . Diabetes Sister   . Diabetes Brother   . Gout Brother     Social History History  Substance Use Topics  . Smoking status: Former Smoker    Types: Cigarettes  . Smokeless tobacco: Never Used  . Alcohol Use: No    No Known Allergies  Current Outpatient Prescriptions  Medication Sig Dispense Refill  . albuterol (PROVENTIL HFA;VENTOLIN HFA) 108 (90 BASE) MCG/ACT inhaler Inhale 2 puffs for 3 nights then inhale 2 puffs every 4 hours as needed. 1 Inhaler 2  . allopurinol (ZYLOPRIM) 100 MG tablet Take 1 tablet (100 mg total) by mouth daily. 90 tablet 1  . amLODipine-valsartan  (EXFORGE) 10-320 MG per tablet Take 1 tablet by mouth daily. 90 tablet 1  . aspirin 81 MG EC tablet Take 81 mg by mouth daily.      Marland Kitchen atorvastatin (LIPITOR) 40 MG tablet Take 1 tablet (40 mg total) by mouth daily. 90 tablet 1  . glucose blood (FREESTYLE LITE) test strip FREESTYLE LITE TEST (In Vitro Strip)  check fsbs once a day for 0 days  Quantity: 100.00;  Refills: 5   Ordered :31-Dec-2009  Steele Sizer MD;  Dani Gobble Active Comments: DX: 250.00    . metFORMIN (GLUCOPHAGE) 1000 MG tablet Take 1 tablet (1,000 mg total) by mouth 2 (two) times daily with a meal. 180 tablet 1  . Multiple Vitamins-Minerals (CENTRUM SILVER ULTRA WOMENS) TABS Take 1 tablet by mouth daily.     No current facility-administered medications for this visit.    Review of Systems Review of Systems  Constitutional: Negative.   Respiratory: Positive for cough. Negative for apnea, choking, chest tightness, shortness of breath, wheezing and stridor.   Cardiovascular: Negative.   Gastrointestinal: Negative.     Blood pressure 130/76, pulse 78, resp. rate 14, height 5\' 7"  (1.702 m), weight 220 lb (99.791 kg).  Physical Exam Physical  Exam  Constitutional: She is oriented to person, place, and time. She appears well-developed and well-nourished.  Eyes: Conjunctivae are normal. No scleral icterus.  Neck: Neck supple.  Cardiovascular: Normal rate, regular rhythm and normal heart sounds.   Pulmonary/Chest: Effort normal and breath sounds normal.  Abdominal: Soft. Normal appearance and bowel sounds are normal. There is no tenderness. A hernia is present. Hernia confirmed positive in the ventral area.    Lymphadenopathy:    She has no cervical adenopathy.  Neurological: She is alert and oriented to person, place, and time.  Skin: Skin is warm and dry.    Data Reviewed Pulmonary notes from April 2016 reviewed.  Assessment    Enlarging ventral hernia, symptomatic.    Plan    Prior to surgical  intervention she's been encouraged to have a follow-up exam with the pulmonary service. This should help minimize symptoms of unnecessary coughing postoperatively which could increase her risk of recurrence. The area has increased significantly in size over the last year and she will benefit from prosthetic mesh placement.    Hernia precautions and incarceration were discussed with the patient. If they develop symptoms of an incarcerated hernia, they were encouraged to seek prompt medical attention.  I have recommended repair of the hernia using mesh on an outpatient basis in the near future. The risk of infection was reviewed. The role of prosthetic mesh to minimize the risk of recurrence was reviewed.  Patient to call and arrange an appointment with Dr. Stevenson Clinch before surgery. She's been asked to call the office if she has difficulty obtaining a timely appointment.  PCP: Frazier Butt 09/15/2014, 9:54 PM

## 2014-09-15 NOTE — Patient Instructions (Signed)
Patient to call and arrange an appointment with Dr. Stevenson Clinch before surgery.

## 2014-09-22 ENCOUNTER — Encounter: Payer: Self-pay | Admitting: Internal Medicine

## 2014-09-22 ENCOUNTER — Ambulatory Visit (INDEPENDENT_AMBULATORY_CARE_PROVIDER_SITE_OTHER): Payer: Commercial Managed Care - PPO | Admitting: Internal Medicine

## 2014-09-22 VITALS — BP 146/98 | HR 78 | Temp 98.3°F | Ht 67.0 in | Wt 220.0 lb

## 2014-09-22 DIAGNOSIS — R05 Cough: Secondary | ICD-10-CM | POA: Diagnosis not present

## 2014-09-22 DIAGNOSIS — Z01811 Encounter for preprocedural respiratory examination: Secondary | ICD-10-CM

## 2014-09-22 DIAGNOSIS — R059 Cough, unspecified: Secondary | ICD-10-CM

## 2014-09-22 MED ORDER — BECLOMETHASONE DIPROPIONATE 80 MCG/ACT IN AERS: 1.0000 | INHALATION_SPRAY | Freq: Every day | RESPIRATORY_TRACT | Status: DC

## 2014-09-22 NOTE — Assessment & Plan Note (Signed)
Multifactorial: Postinfectious, GERD, allergic component or possible asthma-like reaction/bronchospasms Cough resolved after last visit with Qvar, but patient did not refill Qvar for the past 2 months, cough has now returned, mainly in the mornings   Plan: - Possible asthma-like reaction/bronchospasms, has tried omeprazole and albuterol with little relief. - Refill Qvar 80 mg 1 puff daily in the mornings - Patient with essentially normal pulmonary function testing and normal 6 minute walk test   The standardized cough guidelines published in Chest by Lissa Morales in 2006 are still the best available and consist of a multiple step process (up to 12!) , not a single office visit,  and are intended  to address this problem logically,  with an alogrithm dependent on response to empiric treatment at  each progressive step  to determine a specific diagnosis with  minimal addtional testing needed. Therefore if adherence is an issue or can't be accurately verified,  it's very unlikely the standard evaluation and treatment will be successful here.    Furthermore, response to therapy (other than acute cough suppression, which should only be used short term with avoidance of narcotic containing cough syrups if possible), can be a gradual process for which the patient may perceive immediate benefit.

## 2014-09-22 NOTE — Patient Instructions (Signed)
Follow up Dr. Stevenson Clinch in 4 months - we will refill your Qvar - please avoid any forms of tobacco prior to your surgery  - cont with diet and weight loss.

## 2014-09-22 NOTE — Assessment & Plan Note (Signed)
Preoperative Pulmonary Risk Assessment for ventral hernia repair Patient has a 1.8% risk for postop respiratory failure. ([place] AM, Angela Adam, Henderson WG, Khuri SF. Multifactorial risk index for predicting postoperative respiratory failure in men after major noncardiac surgery. [facility] 2000; [phone].)  Respiratory failure define as failure to wean from mechanical ventilation within 48 hours of surgery or unplanned intubation/reintubation postoperatively   Patient is proposed low risk (1.6%) for postop pulmonary complications. (From [employer], et al. Anesthesiology 2010; [id number]) Pulmonary complications such as post-op resp. failure, plus PNA, effusion, atelectasis, bronchospasm.  General Risk Reduction Strategies: - All patients warrant post-operative incentive spirometry. For those with obstruction, also consider flutter valve. - Early ambulation, PT/OT - DVT prophylaxis where appropriate - Adequate pain control without oversedation - tobacco cessation or reduction at least 4-6 weeks prior to surgery.

## 2014-09-22 NOTE — Progress Notes (Signed)
MRN# [id number] Cheryl Barr 02/19/1955   CC: Chief Complaint  Patient presents with  . Follow-up    cough; went away but is coming back; prod at times w/white mucus; no chest tightness/pain      Brief History: HPI 01/21/14 Patient is a pleasant 59 year old female past medical history significant for hypertension, vitamin D deficiency, osteoarthritis, obesity, referred by her primary care physician ([facility]) for recurrent cough over the past 3 months. Patient had stated that she had a "bad cold 3 months ago," it lasted about month; Symptoms cough, productive (thick and white), runny nose - treated with cough syrup and inhalers, antibiotics. Since then patient has had a persistent cough, small bouts, with no wheezes, dry cough, sounds like throat clearing.  Worst at night when lying down and in the mornings, occasional sour burps at night. Patient that the cough is reducing, but not completely resolving itself.currently, patient states the cough is nonproductive, and feels like "I have to clear my throat all the time ." She also stated that every year she gets a cold/cough from fall into winter.  PLAN - GERD- one month PPI trial, pfts, 33mwt, albuterol prn inhaler.    ROV 03/13/14 Patient presents today for a follow up of chronic cough, "tickling sensation." At her last visit she was given a trial of PPI, which she states helped with her cough. She still has a mild cough, but has also run out of PPI tabs. She has been using her albuterol inhaler 2 times per week, mostly at night for cough. No sob, no wheezing.  Patient was supposed to have a pft and 6 mwt done prior the visit, but she was told that she had a follow up visit, that these examinations were not completed prior to today's visit.  Plan - pfts, 37mwt, ppi trial, albuterol PRN   ROV 05/28/14 She presents today for follow-up evaluation of chronic cough, now going on for the past 6 months. At her last visit  she was given a trial of omeprazole 40 mg, and albuterol when necessary. Since her last visit she states that she cannot tolerate omeprazole, she keeps vomiting it after taking it and has since stopped omeprazole. Patient states that she's been using albuterol scheduled every morning which has not been helping with her cough. Today she describes her cough as "tickle in throat" which is intermittent thick white production or clear sputum production, usually only in the mornings and occasionally during daytimes, no triggers could be identified. Today she had her pulmonary function test done a 6 minute walk test on which she would like to discuss  Plan: - Possible asthma-like reaction/bronchospasms, has tried omeprazole and albuterol with little relief. - Will give trial of Qvar 80 mg 1 puff daily in the mornings - Patient with essentially normal pulmonary function testing and normal 6 minute walk test  Events since last clinic visit: Patient presents today for a follow-up visit of her chronic cough. At her last visit she was started on Qvar, had interval resolution of her cough, but did not refill the Qvar over the past 2 months. Over the past 2 weeks her cough has returned, mostly in the mornings with laying flat, intermittent productive sputum of thick white. Review of chart shows that she's had 1 ED visit for a gout attack. She is also following with surgery for ventral hernia repair and is requiring pulmonary preop clearance today.  Overall patient states her breathing is fairly stable except for the return  of the cough over the past 2 weeks. No shortness of breath at rest, no shortness of breath with exertion, no fever, night sweats, no sudden weight loss.   Medication:   Current Outpatient Rx  Name  Route  Sig  Dispense  Refill  . albuterol (PROVENTIL HFA;VENTOLIN HFA) 108 (90 BASE) MCG/ACT inhaler      Inhale 2 puffs for 3 nights then inhale 2 puffs every 4 hours as needed.   1  Inhaler   2   . allopurinol (ZYLOPRIM) 100 MG tablet   Oral   Take 1 tablet (100 mg total) by mouth daily.   90 tablet   1   . amLODipine-valsartan (EXFORGE) 10-320 MG per tablet   Oral   Take 1 tablet by mouth daily.   90 tablet   1   . aspirin 81 MG EC tablet   Oral   Take 81 mg by mouth daily.           Marland Kitchen atorvastatin (LIPITOR) 40 MG tablet   Oral   Take 1 tablet (40 mg total) by mouth daily.   90 tablet   1   . glucose blood (FREESTYLE LITE) test strip      FREESTYLE LITE TEST (In Vitro Strip)  check fsbs once a day for 0 days  Quantity: 100.00;  Refills: 5   Ordered :31-Dec-2009  Steele Sizer MD;  Dani Gobble Active Comments: DX: 250.00         . metFORMIN (GLUCOPHAGE) 1000 MG tablet   Oral   Take 1 tablet (1,000 mg total) by mouth 2 (two) times daily with a meal.   180 tablet   1   . Multiple Vitamins-Minerals (CENTRUM SILVER ULTRA WOMENS) TABS   Oral   Take 1 tablet by mouth daily.            Review of Systems: Gen:  Denies  fever, sweats, chills HEENT: Denies blurred vision, double vision, ear pain, eye pain, hearing loss, nose bleeds, sore throat Cvc:  No dizziness, chest pain or heaviness Resp:   Admits [id number] cough, intermittent sputum production (whitish).  Gi: Denies swallowing difficulty, stomach pain, nausea or vomiting, diarrhea, constipation, bowel incontinence Gu:  Denies bladder incontinence, burning urine Ext:   No Joint pain, stiffness or swelling Skin: No skin rash, easy bruising or bleeding or hives Endoc:  No polyuria, polydipsia , polyphagia or weight change Other:  All other systems negative  Allergies:  Review of patient's allergies indicates no known allergies.  Physical Examination:  VS: BP 146/98 mmHg  Pulse 78  Temp(Src) 98.3 F (36.8 C) (Oral)  Ht 5\' 7"  (1.702 m)  Wt 220 lb (99.791 kg)  BMI 34.45 kg/m2  SpO2 100%  General Appearance: No distress  HEENT: PERRLA, no ptosis, no other lesions  noticed Pulmonary:normal breath sounds., diaphragmatic excursion normal.No wheezing, No rales   Cardiovascular:  Normal S1,S2.  No m/r/g.     Abdomen:Exam: Benign, Soft, non-tender, No masses  Skin:   warm, no rashes, no ecchymosis  Extremities: normal, no cyanosis, clubbing, warm with normal capillary refill.      Assessment and Plan: 59 year old female following up with pulmonary for chronic cough, suspect adult-onset asthma. Cough Multifactorial: Postinfectious, GERD, allergic component or possible asthma-like reaction/bronchospasms Cough resolved after last visit with Qvar, but patient did not refill Qvar for the past 2 months, cough has now returned, mainly in the mornings   Plan: - Possible asthma-like reaction/bronchospasms, has tried omeprazole and albuterol with  little relief. - Refill Qvar 80 mg 1 puff daily in the mornings - Patient with essentially normal pulmonary function testing and normal 6 minute walk test   The standardized cough guidelines published in Chest by Lissa Morales in 2006 are still the best available and consist of a multiple step process (up to 12!) , not a single office visit,  and are intended  to address this problem logically,  with an alogrithm dependent on response to empiric treatment at  each progressive step  to determine a specific diagnosis with  minimal addtional testing needed. Therefore if adherence is an issue or can't be accurately verified,  it's very unlikely the standard evaluation and treatment will be successful here.    Furthermore, response to therapy (other than acute cough suppression, which should only be used short term with avoidance of narcotic containing cough syrups if possible), can be a gradual process for which the patient may perceive immediate benefit.        Preop pulmonary/respiratory exam Preoperative Pulmonary Risk Assessment for ventral hernia repair Patient has a 1.8% risk for postop respiratory failure.  ([place] AM, Angela Adam, Henderson WG, Khuri SF. Multifactorial risk index for predicting postoperative respiratory failure in men after major noncardiac surgery. The [facility] [place] [facility] 2000; [phone].)  Respiratory failure define as failure to wean from mechanical ventilation within 48 hours of surgery or unplanned intubation/reintubation postoperatively   Patient is proposed low risk (1.6%) for postop pulmonary complications. (From [employer], et al. Anesthesiology 2010; [id number]) Pulmonary complications such as post-op resp. failure, plus PNA, effusion, atelectasis, bronchospasm.  General Risk Reduction Strategies: - All patients warrant post-operative incentive spirometry. For those with obstruction, also consider flutter valve. - Early ambulation, PT/OT - DVT prophylaxis where appropriate - Adequate pain control without oversedation - tobacco cessation or reduction at least 4-6 weeks prior to surgery.      Updated Medication List Outpatient Encounter Prescriptions as of 09/22/2014  Medication Sig  . allopurinol (ZYLOPRIM) 100 MG tablet Take 1 tablet (100 mg total) by mouth daily.  Marland Kitchen amLODipine-valsartan (EXFORGE) 10-320 MG per tablet Take 1 tablet by mouth daily.  Marland Kitchen aspirin 81 MG EC tablet Take 81 mg by mouth daily.    Marland Kitchen atorvastatin (LIPITOR) 40 MG tablet Take 1 tablet (40 mg total) by mouth daily.  Marland Kitchen glucose blood (FREESTYLE LITE) test strip FREESTYLE LITE TEST (In Vitro Strip)  check fsbs once a day for 0 days  Quantity: 100.00;  Refills: 5   Ordered :31-Dec-2009  Steele Sizer MD;  Dani Gobble Active Comments: DX: 250.00  . metFORMIN (GLUCOPHAGE) 1000 MG tablet Take 1 tablet (1,000 mg total) by mouth 2 (two) times daily with a meal.  . Multiple Vitamins-Minerals (CENTRUM SILVER ULTRA WOMENS) TABS Take 1 tablet by mouth daily.  Marland Kitchen albuterol (PROVENTIL HFA;VENTOLIN HFA) 108 (90 BASE) MCG/ACT  inhaler Inhale 2 puffs for 3 nights then inhale 2 puffs every 4 hours as needed. (Patient not taking: Reported on 09/22/2014)  . beclomethasone (QVAR) 80 MCG/ACT inhaler Inhale 1 puff into the lungs daily.   No facility-administered encounter medications on file as of 09/22/2014.    Orders for this visit: No orders of the defined types were placed in this encounter.    Thank  you for the visitation and for allowing  [facility] to assist in the care of your patient. Our recommendations are noted above.  Please contact us if  we can be of further service.  Vilinda Boehringer, MD [facility] Office Number: [phone]

## 2014-09-25 ENCOUNTER — Encounter: Payer: Self-pay | Admitting: General Surgery

## 2014-09-25 ENCOUNTER — Telehealth: Payer: Self-pay | Admitting: *Deleted

## 2014-09-25 NOTE — Telephone Encounter (Signed)
This patient did see Dr. Stevenson Clinch as requested. His recommendation was that patient stop smoking or reduce smoking at least 4-6 weeks prior to surgery.   Patient reports today that she has not smoked in over 30 years.   This patient's surgery has been scheduled for 10-09-14 at [facility]. It is okay for patient to continue 81 mg aspirin once daily.

## 2014-10-02 ENCOUNTER — Other Ambulatory Visit: Payer: Self-pay | Admitting: General Surgery

## 2014-10-02 ENCOUNTER — Encounter: Payer: Self-pay | Admitting: *Deleted

## 2014-10-02 ENCOUNTER — Other Ambulatory Visit: Payer: Commercial Managed Care - PPO

## 2014-10-02 DIAGNOSIS — G4700 Insomnia, unspecified: Secondary | ICD-10-CM | POA: Diagnosis not present

## 2014-10-02 DIAGNOSIS — M1990 Unspecified osteoarthritis, unspecified site: Secondary | ICD-10-CM | POA: Diagnosis not present

## 2014-10-02 DIAGNOSIS — Z86010 Personal history of colonic polyps: Secondary | ICD-10-CM | POA: Diagnosis not present

## 2014-10-02 DIAGNOSIS — F329 Major depressive disorder, single episode, unspecified: Secondary | ICD-10-CM | POA: Diagnosis not present

## 2014-10-02 DIAGNOSIS — Z79899 Other long term (current) drug therapy: Secondary | ICD-10-CM | POA: Diagnosis not present

## 2014-10-02 DIAGNOSIS — Z833 Family history of diabetes mellitus: Secondary | ICD-10-CM | POA: Diagnosis not present

## 2014-10-02 DIAGNOSIS — I10 Essential (primary) hypertension: Secondary | ICD-10-CM | POA: Diagnosis not present

## 2014-10-02 DIAGNOSIS — M797 Fibromyalgia: Secondary | ICD-10-CM | POA: Diagnosis not present

## 2014-10-02 DIAGNOSIS — Z8249 Family history of ischemic heart disease and other diseases of the circulatory system: Secondary | ICD-10-CM | POA: Diagnosis not present

## 2014-10-02 DIAGNOSIS — E785 Hyperlipidemia, unspecified: Secondary | ICD-10-CM | POA: Diagnosis not present

## 2014-10-02 DIAGNOSIS — Z7951 Long term (current) use of inhaled steroids: Secondary | ICD-10-CM | POA: Diagnosis not present

## 2014-10-02 DIAGNOSIS — Z803 Family history of malignant neoplasm of breast: Secondary | ICD-10-CM | POA: Diagnosis not present

## 2014-10-02 DIAGNOSIS — E559 Vitamin D deficiency, unspecified: Secondary | ICD-10-CM | POA: Diagnosis not present

## 2014-10-02 DIAGNOSIS — K439 Ventral hernia without obstruction or gangrene: Secondary | ICD-10-CM

## 2014-10-02 DIAGNOSIS — Z87891 Personal history of nicotine dependence: Secondary | ICD-10-CM | POA: Diagnosis not present

## 2014-10-02 DIAGNOSIS — Z7982 Long term (current) use of aspirin: Secondary | ICD-10-CM | POA: Diagnosis not present

## 2014-10-02 DIAGNOSIS — Z8489 Family history of other specified conditions: Secondary | ICD-10-CM | POA: Diagnosis not present

## 2014-10-02 DIAGNOSIS — E119 Type 2 diabetes mellitus without complications: Secondary | ICD-10-CM | POA: Diagnosis not present

## 2014-10-02 NOTE — Patient Instructions (Signed)
  Your procedure is scheduled on: 10/09/14 Report to Day Surgery. [facility] second floor To find out your arrival time please call [phone] between 1PM - 3PM on 10/08/14  Remember: Instructions that are not followed completely may result in serious medical risk, up to and including death, or upon the discretion of your surgeon and anesthesiologist your surgery may need to be rescheduled.    __x__ 1. Do not eat food or drink liquids after midnight. No gum chewing or hard candies.     ___x_ 2. No Alcohol for 24 hours before or after surgery.   _x___ 3. Bring all medications with you on the day of surgery if instructed.    ____ 4. Notify your doctor if there is any change in your medical condition     (cold, fever, infections).     Do not wear jewelry, make-up, hairpins, clips or nail polish.  Do not wear lotions, powders, or perfumes. You may wear deodorant.  Do not shave 48 hours prior to surgery. Men may shave face and neck.  Do not bring valuables to the hospital.    [facility] is not responsible for any belongings or valuables.               Contacts, dentures or bridgework may not be worn into surgery.  Leave your suitcase in the car. After surgery it may be brought to your room.  For patients admitted to the hospital, discharge time is determined by your                treatment team.   Patients discharged the day of surgery will not be allowed to drive home.   Please read over the following fact sheets that you were given:   Surgical Site Infection Prevention   __x__ Take these medicines the morning of surgery with A SIP OF WATER:    1. exforge  2.   3.   4.  5.  6.  ____ Fleet Enema (as directed)   _x___ Use CHG Soap as directed  _x___ Use inhalers on the day of surgery  __x__ Stop metformin 2 days prior to surgery    ____ Take 1/2 of usual insulin dose the night before surgery and none on the morning of surgery.   __x__ Stop Coumadin/Plavix/aspirin on  STOP ASPIRIN UNTIL AFTER SURGERY ____ Stop Anti-inflammatories on   ____ Stop supplements until after surgery.    ____ Bring C-Pap to the hospital.

## 2014-10-02 NOTE — H&P (Signed)
Patient ID: Cheryl Barr, female DOB: 13-Jun-1955, 59 y.o. MRN: [id number]  Chief Complaint  Patient presents with  . Follow-up    ventral hernia    HPI Cheryl Barr is a 59 y.o. female for follow up and reevaluation of a ventral hernia. Patient states the area has been hurting more in the last month with movement and lifting.  The patient developed an upper respiratory infection in late 2015. Since then she's had significant cough. She's been evaluated by pulmonary medicine without clear etiology identified. She's had more discomfort in the ventral hernia site since her URI. No obstructive symptoms. HPI  Past Medical History  Diagnosis Date  . Hyperlipidemia   . Hypertension   . Diabetes mellitus     Type 2  . Vitamin D deficiency   . Insomnia   . Depression   . Menopause   . Fibromyalgia   . Arthritis   . Colon polyp 2009  . Gout     Past Surgical History  Procedure Laterality Date  . Colonoscopy  2009    1 polyp found. Dr. Roselyn Reef  . Breast surgery Left 2015  . Strabismus surgery      Repair as a child  . Knee surgery Left 01/14/2009  . Tubal ligation  1990    Family History  Problem Relation Age of Onset  . Hypertension Mother   . Diabetes Mother   . Heart attack Father   . Coronary artery disease Father   . Breast cancer Father 9  . Allergies Son     Barista  . Diabetes Sister   . Diabetes Brother   . Gout Brother     Social History History  Substance Use Topics  . Smoking status: Former Smoker    Types: Cigarettes  . Smokeless tobacco: Never Used  . Alcohol Use: No    No Known Allergies  Current Outpatient Prescriptions  Medication Sig Dispense Refill  . albuterol (PROVENTIL HFA;VENTOLIN HFA) 108 (90 BASE) MCG/ACT inhaler Inhale 2 puffs for 3 nights then inhale 2 puffs every 4  hours as needed. 1 Inhaler 2  . allopurinol (ZYLOPRIM) 100 MG tablet Take 1 tablet (100 mg total) by mouth daily. 90 tablet 1  . amLODipine-valsartan (EXFORGE) 10-320 MG per tablet Take 1 tablet by mouth daily. 90 tablet 1  . aspirin 81 MG EC tablet Take 81 mg by mouth daily.     Marland Kitchen atorvastatin (LIPITOR) 40 MG tablet Take 1 tablet (40 mg total) by mouth daily. 90 tablet 1  . glucose blood (FREESTYLE LITE) test strip FREESTYLE LITE TEST (In Vitro Strip) check fsbs once a day for 0 days Quantity: 100.00; Refills: 5  Ordered :31-Dec-2009 Steele Sizer MD; Dani Gobble Active Comments: DX: 250.00    . metFORMIN (GLUCOPHAGE) 1000 MG tablet Take 1 tablet (1,000 mg total) by mouth 2 (two) times daily with a meal. 180 tablet 1  . Multiple Vitamins-Minerals (CENTRUM SILVER ULTRA WOMENS) TABS Take 1 tablet by mouth daily.     No current facility-administered medications for this visit.    Review of Systems Review of Systems  Constitutional: Negative.  Respiratory: Positive for cough. Negative for apnea, choking, chest tightness, shortness of breath, wheezing and stridor.  Cardiovascular: Negative.  Gastrointestinal: Negative.    Blood pressure 130/76, pulse 78, resp. rate 14, height 5\' 7"  (1.702 m), weight 220 lb (99.791 kg).  Physical Exam Physical Exam  Constitutional: She is oriented to person, place, and time. She appears well-developed and  well-nourished.  Eyes: Conjunctivae are normal. No scleral icterus.  Neck: Neck supple.  Cardiovascular: Normal rate, regular rhythm and normal heart sounds.  Pulmonary/Chest: Effort normal and breath sounds normal.  Abdominal: Soft. Normal appearance and bowel sounds are normal. There is no tenderness. A hernia is present. Hernia confirmed positive in the ventral area.    Lymphadenopathy:   She has no cervical adenopathy.  Neurological: She is alert and oriented to person, place, and  time.  Skin: Skin is warm and dry.    Data Reviewed Pulmonary notes from April 2016 reviewed.  Assessment    Enlarging ventral hernia, symptomatic.    Plan    Prior to surgical intervention she's been encouraged to have a follow-up exam with the pulmonary service. This should help minimize symptoms of unnecessary coughing postoperatively which could increase her risk of recurrence. The area has increased significantly in size over the last year and she will benefit from prosthetic mesh placement.    Hernia precautions and incarceration were discussed with the patient. If they develop symptoms of an incarcerated hernia, they were encouraged to seek prompt medical attention.  I have recommended repair of the hernia using mesh on an outpatient basis in the near future. The risk of infection was reviewed. The role of prosthetic mesh to minimize the risk of recurrence was reviewed.  Patient to call and arrange an appointment with Dr. Stevenson Clinch before surgery. She's been asked to call the office if she has difficulty obtaining a timely appointment.  PCP: Frazier Butt 09/15/2014, 9:54 PM  The patient has been evaluated by the pulmonary service. She is a nonsmoker. No contraindication to proceeding.  10/02/2014

## 2014-10-06 ENCOUNTER — Encounter
Admission: RE | Admit: 2014-10-06 | Discharge: 2014-10-06 | Disposition: A | Discharge status: Home / Self Care | Payer: Commercial Managed Care - PPO | Source: Ambulatory Visit | Attending: Anesthesiology | Admitting: Anesthesiology

## 2014-10-06 DIAGNOSIS — K439 Ventral hernia without obstruction or gangrene: Secondary | ICD-10-CM | POA: Diagnosis not present

## 2014-10-06 LAB — BASIC METABOLIC PANEL
Anion gap: 9 (ref 5–15)
BUN: 18 mg/dL (ref 6–20)
CHLORIDE: 105 mmol/L (ref 101–111)
CO2: 28 mmol/L (ref 22–32)
CREATININE: 0.83 mg/dL (ref 0.44–1.00)
Calcium: 9.6 mg/dL (ref 8.9–10.3)
GFR calc non Af Amer: >60 mL/min (ref >60)
Glucose, Bld: 120 mg/dL — ABNORMAL HIGH (ref 65–99)
Potassium: 3.6 mmol/L (ref 3.5–5.1)
Sodium: 142 mmol/L (ref 135–145)

## 2014-10-07 NOTE — Pre-Procedure Instructions (Signed)
Sent ekg over for review-dr kephart reviewed ekg and said ok to proceed with surgery

## 2014-10-09 ENCOUNTER — Encounter: Payer: Self-pay | Admitting: *Deleted

## 2014-10-09 ENCOUNTER — Ambulatory Visit
Admission: RE | Admit: 2014-10-09 | Discharge: 2014-10-09 | Disposition: A | Discharge status: Home / Self Care | Payer: Commercial Managed Care - PPO | Source: Ambulatory Visit | Attending: General Surgery | Admitting: General Surgery

## 2014-10-09 ENCOUNTER — Encounter
Admission: RE | Disposition: A | Discharge status: Home / Self Care | Payer: Self-pay | Source: Ambulatory Visit | Attending: General Surgery

## 2014-10-09 ENCOUNTER — Ambulatory Visit: Payer: Commercial Managed Care - PPO | Admitting: Anesthesiology

## 2014-10-09 DIAGNOSIS — F329 Major depressive disorder, single episode, unspecified: Secondary | ICD-10-CM | POA: Insufficient documentation

## 2014-10-09 DIAGNOSIS — K429 Umbilical hernia without obstruction or gangrene: Secondary | ICD-10-CM

## 2014-10-09 DIAGNOSIS — E785 Hyperlipidemia, unspecified: Secondary | ICD-10-CM | POA: Insufficient documentation

## 2014-10-09 DIAGNOSIS — Z7982 Long term (current) use of aspirin: Secondary | ICD-10-CM | POA: Insufficient documentation

## 2014-10-09 DIAGNOSIS — Z833 Family history of diabetes mellitus: Secondary | ICD-10-CM | POA: Insufficient documentation

## 2014-10-09 DIAGNOSIS — Z803 Family history of malignant neoplasm of breast: Secondary | ICD-10-CM | POA: Insufficient documentation

## 2014-10-09 DIAGNOSIS — K439 Ventral hernia without obstruction or gangrene: Secondary | ICD-10-CM

## 2014-10-09 DIAGNOSIS — M1990 Unspecified osteoarthritis, unspecified site: Secondary | ICD-10-CM | POA: Insufficient documentation

## 2014-10-09 DIAGNOSIS — Z87891 Personal history of nicotine dependence: Secondary | ICD-10-CM | POA: Insufficient documentation

## 2014-10-09 DIAGNOSIS — Z8489 Family history of other specified conditions: Secondary | ICD-10-CM | POA: Insufficient documentation

## 2014-10-09 DIAGNOSIS — M797 Fibromyalgia: Secondary | ICD-10-CM | POA: Insufficient documentation

## 2014-10-09 DIAGNOSIS — G4700 Insomnia, unspecified: Secondary | ICD-10-CM | POA: Insufficient documentation

## 2014-10-09 DIAGNOSIS — Z79899 Other long term (current) drug therapy: Secondary | ICD-10-CM | POA: Insufficient documentation

## 2014-10-09 DIAGNOSIS — Z86010 Personal history of colonic polyps: Secondary | ICD-10-CM | POA: Insufficient documentation

## 2014-10-09 DIAGNOSIS — I10 Essential (primary) hypertension: Secondary | ICD-10-CM | POA: Insufficient documentation

## 2014-10-09 DIAGNOSIS — E119 Type 2 diabetes mellitus without complications: Secondary | ICD-10-CM | POA: Insufficient documentation

## 2014-10-09 DIAGNOSIS — Z7951 Long term (current) use of inhaled steroids: Secondary | ICD-10-CM | POA: Insufficient documentation

## 2014-10-09 DIAGNOSIS — Z8249 Family history of ischemic heart disease and other diseases of the circulatory system: Secondary | ICD-10-CM | POA: Insufficient documentation

## 2014-10-09 DIAGNOSIS — E559 Vitamin D deficiency, unspecified: Secondary | ICD-10-CM | POA: Insufficient documentation

## 2014-10-09 HISTORY — PX: INSERTION OF MESH: SHX5868

## 2014-10-09 HISTORY — PX: VENTRAL HERNIA REPAIR: SHX424

## 2014-10-09 HISTORY — PX: HERNIA REPAIR: SHX51

## 2014-10-09 LAB — GLUCOSE, CAPILLARY
GLUCOSE-CAPILLARY: 126 mg/dL — AB (ref 65–99)
Glucose-Capillary: 148 mg/dL — ABNORMAL HIGH (ref 65–99)

## 2014-10-09 SURGERY — REPAIR, HERNIA, VENTRAL
Anesthesia: General

## 2014-10-09 MED ORDER — ACETAMINOPHEN 10 MG/ML IV SOLN
INTRAVENOUS | Status: AC
  Filled 2014-10-09: qty 100

## 2014-10-09 MED ORDER — FAMOTIDINE 20 MG PO TABS
20.0000 mg | ORAL_TABLET | Freq: Once | ORAL | Status: AC
  Administered 2014-10-09: 20 mg via ORAL

## 2014-10-09 MED ORDER — FENTANYL CITRATE (PF) 100 MCG/2ML IJ SOLN
INTRAMUSCULAR | Status: AC
  Filled 2014-10-09: qty 2

## 2014-10-09 MED ORDER — FENTANYL CITRATE (PF) 100 MCG/2ML IJ SOLN
INTRAMUSCULAR | Status: DC | PRN
Start: 2014-10-09 — End: 2014-10-09
  Administered 2014-10-09: 25 ug via INTRAVENOUS
  Administered 2014-10-09: 50 ug via INTRAVENOUS

## 2014-10-09 MED ORDER — HYDROCODONE-ACETAMINOPHEN 5-325 MG PO TABS: 1.0000 | ORAL_TABLET | ORAL | Status: DC | PRN

## 2014-10-09 MED ORDER — NEOSTIGMINE METHYLSULFATE 10 MG/10ML IV SOLN
INTRAVENOUS | Status: DC | PRN
  Administered 2014-10-09: 4 mg via INTRAVENOUS

## 2014-10-09 MED ORDER — PROPOFOL 10 MG/ML IV BOLUS
INTRAVENOUS | Status: DC | PRN
  Administered 2014-10-09: 200 mg via INTRAVENOUS

## 2014-10-09 MED ORDER — MIDAZOLAM HCL 5 MG/5ML IJ SOLN
INTRAMUSCULAR | Status: DC | PRN
  Administered 2014-10-09: 2 mg via INTRAVENOUS

## 2014-10-09 MED ORDER — ONDANSETRON HCL 4 MG/2ML IJ SOLN
INTRAMUSCULAR | Status: DC | PRN
  Administered 2014-10-09: 4 mg via INTRAVENOUS

## 2014-10-09 MED ORDER — FENTANYL CITRATE (PF) 100 MCG/2ML IJ SOLN
25.0000 ug | INTRAMUSCULAR | Status: DC | PRN
  Administered 2014-10-09 (×2): 50 ug via INTRAVENOUS

## 2014-10-09 MED ORDER — SODIUM CHLORIDE 0.9 % IV SOLN
INTRAVENOUS | Status: DC
  Administered 2014-10-09 (×2): via INTRAVENOUS

## 2014-10-09 MED ORDER — BUPIVACAINE-EPINEPHRINE (PF) 0.5% -1:200000 IJ SOLN
INTRAMUSCULAR | Status: AC
  Filled 2014-10-09: qty 30

## 2014-10-09 MED ORDER — ROCURONIUM BROMIDE 100 MG/10ML IV SOLN
INTRAVENOUS | Status: DC | PRN
  Administered 2014-10-09: 40 mg via INTRAVENOUS

## 2014-10-09 MED ORDER — CEFAZOLIN SODIUM-DEXTROSE 2-3 GM-% IV SOLR
2.0000 g | INTRAVENOUS | Status: AC
  Administered 2014-10-09: 2 g via INTRAVENOUS

## 2014-10-09 MED ORDER — GLYCOPYRROLATE 0.2 MG/ML IJ SOLN
INTRAMUSCULAR | Status: DC | PRN
  Administered 2014-10-09: 0.6 mg via INTRAVENOUS

## 2014-10-09 MED ORDER — HYDROCODONE-ACETAMINOPHEN 5-325 MG PO TABS
1.0000 | ORAL_TABLET | ORAL | Status: DC | PRN
  Administered 2014-10-09: 2 via ORAL

## 2014-10-09 MED ORDER — FAMOTIDINE 20 MG PO TABS
ORAL_TABLET | ORAL | Status: AC
  Administered 2014-10-09: 20 mg via ORAL
  Filled 2014-10-09: qty 1

## 2014-10-09 MED ORDER — CEFAZOLIN SODIUM-DEXTROSE 2-3 GM-% IV SOLR
INTRAVENOUS | Status: AC
  Administered 2014-10-09: 2 g via INTRAVENOUS
  Filled 2014-10-09: qty 50

## 2014-10-09 MED ORDER — ACETAMINOPHEN 10 MG/ML IV SOLN
INTRAVENOUS | Status: DC | PRN
  Administered 2014-10-09: 1000 mg via INTRAVENOUS

## 2014-10-09 MED ORDER — HYDROCODONE-ACETAMINOPHEN 5-325 MG PO TABS
ORAL_TABLET | ORAL | Status: AC
  Administered 2014-10-09: 2 via ORAL
  Filled 2014-10-09: qty 2

## 2014-10-09 MED ORDER — PROMETHAZINE HCL 25 MG/ML IJ SOLN: 6.2500 mg | INTRAMUSCULAR | Status: DC | PRN

## 2014-10-09 MED ORDER — BUPIVACAINE-EPINEPHRINE 0.5% -1:200000 IJ SOLN
INTRAMUSCULAR | Status: DC | PRN
  Administered 2014-10-09: 30 mL

## 2014-10-09 MED ORDER — BUPIVACAINE HCL (PF) 0.5 % IJ SOLN
INTRAMUSCULAR | Status: AC
  Filled 2014-10-09: qty 30

## 2014-10-09 MED ORDER — KETOROLAC TROMETHAMINE 30 MG/ML IJ SOLN
INTRAMUSCULAR | Status: DC | PRN
  Administered 2014-10-09: 30 mg via INTRAVENOUS

## 2014-10-09 MED ORDER — EPHEDRINE SULFATE 50 MG/ML IJ SOLN
INTRAMUSCULAR | Status: DC | PRN
  Administered 2014-10-09: 10 mg via INTRAVENOUS

## 2014-10-09 MED ORDER — LIDOCAINE HCL (CARDIAC) 20 MG/ML IV SOLN
INTRAVENOUS | Status: DC | PRN
  Administered 2014-10-09: 80 mg via INTRAVENOUS

## 2014-10-09 SURGICAL SUPPLY — 38 items
BENZOIN TINCTURE PRP APPL 2/3 (GAUZE/BANDAGES/DRESSINGS) ×2 IMPLANT
BINDER ABDOMINAL 12 ML 46-62 (SOFTGOODS) ×2 IMPLANT
BLADE SURG 15 STRL SS SAFETY (BLADE) ×2 IMPLANT
CANISTER SUCT 1200ML W/VALVE (MISCELLANEOUS) ×2 IMPLANT
CATH TRAY 16F METER LATEX (MISCELLANEOUS) IMPLANT
CHLORAPREP W/TINT 26ML (MISCELLANEOUS) ×2 IMPLANT
DRAIN CHANNEL JP 15F RND 16 (MISCELLANEOUS) IMPLANT
DRAPE CHEST BREAST 77X106 FENE (MISCELLANEOUS) IMPLANT
DRAPE LAPAROTOMY 100X77 ABD (DRAPES) ×2 IMPLANT
DRSG TEGADERM 4X4.75 (GAUZE/BANDAGES/DRESSINGS) ×2 IMPLANT
DRSG TELFA 3X8 NADH (GAUZE/BANDAGES/DRESSINGS) ×2 IMPLANT
GAUZE SPONGE 4X4 12PLY STRL (GAUZE/BANDAGES/DRESSINGS) ×2 IMPLANT
GLOVE BIO SURGEON STRL SZ7.5 (GLOVE) ×6 IMPLANT
GLOVE INDICATOR 8.0 STRL GRN (GLOVE) ×2 IMPLANT
GOWN STRL REUS W/ TWL LRG LVL3 (GOWN DISPOSABLE) ×2 IMPLANT
GOWN STRL REUS W/TWL LRG LVL3 (GOWN DISPOSABLE) ×2
KIT RM TURNOVER STRD PROC AR (KITS) ×2 IMPLANT
LABEL OR SOLS (LABEL) ×2 IMPLANT
MESH ATRIUM 6X6 (Mesh General) ×2 IMPLANT
NDL SAFETY 22GX1.5 (NEEDLE) ×2 IMPLANT
NEEDLE HYPO 25X1 1.5 SAFETY (NEEDLE) ×2 IMPLANT
NS IRRIG 500ML POUR BTL (IV SOLUTION) ×2 IMPLANT
PACK BASIN MINOR ARMC (MISCELLANEOUS) ×2 IMPLANT
PAD GROUND ADULT SPLIT (MISCELLANEOUS) ×2 IMPLANT
SPONGE LAP 18X18 5 PK (GAUZE/BANDAGES/DRESSINGS) IMPLANT
STAPLER SKIN PROX 35W (STAPLE) ×2 IMPLANT
STRIP CLOSURE SKIN 1/2X4 (GAUZE/BANDAGES/DRESSINGS) ×2 IMPLANT
SUT MAXON ABS #0 GS21 30IN (SUTURE) ×8 IMPLANT
SUT SURGILON 0 BLK (SUTURE) IMPLANT
SUT VIC AB 2-0 BRD 54 (SUTURE) IMPLANT
SUT VIC AB 2-0 CT1 27 (SUTURE) ×1
SUT VIC AB 2-0 CT1 TAPERPNT 27 (SUTURE) ×1 IMPLANT
SUT VIC AB 3-0 SH 27 (SUTURE) ×1
SUT VIC AB 3-0 SH 27X BRD (SUTURE) ×1 IMPLANT
SUT VIC AB 4-0 FS2 27 (SUTURE) ×2 IMPLANT
SUT VICRYL+ 3-0 144IN (SUTURE) IMPLANT
SYR 3ML LL SCALE MARK (SYRINGE) IMPLANT
SYR CONTROL 10ML (SYRINGE) ×2 IMPLANT

## 2014-10-09 NOTE — Anesthesia Procedure Notes (Signed)
Procedure Name: Intubation Date/Time: 10/09/2014 7:49 AM Performed by: Delaney Meigs Pre-anesthesia Checklist: Patient identified, Emergency Drugs available, Suction available, Patient being monitored and Timeout performed Patient Re-evaluated:Patient Re-evaluated prior to inductionOxygen Delivery Method: Circle system utilized Preoxygenation: Pre-oxygenation with 100% oxygen Intubation Type: IV induction Ventilation: Mask ventilation without difficulty Laryngoscope Size: Mac and 3 Tube type: Oral Tube size: 7.0 mm Number of attempts: 1 Airway Equipment and Method: Stylet Placement Confirmation: ETT inserted through vocal cords under direct vision,  positive ETCO2 and breath sounds checked- equal and bilateral Secured at: 21 cm Tube secured with: Tape Dental Injury: Teeth and Oropharynx as per pre-operative assessment

## 2014-10-09 NOTE — Anesthesia Postprocedure Evaluation (Signed)
  Anesthesia Post-op Note  Patient: Cheryl Barr  Procedure(s) Performed: Procedure(s): HERNIA REPAIR VENTRAL ADULT (N/A) INSERTION OF MESH (N/A)  Anesthesia type:General  Patient location: PACU  Post pain: Pain level controlled  Post assessment: Post-op Vital signs reviewed, Patient's Cardiovascular Status Stable, Respiratory Function Stable, Patent Airway and No signs of Nausea or vomiting  Post vital signs: Reviewed and stable  Last Vitals:  Filed Vitals:   10/09/14 1058  BP: 128/95  Pulse: 57  Temp:   Resp: 16    Level of consciousness: awake, alert  and patient cooperative  Complications: No apparent anesthesia complications

## 2014-10-09 NOTE — Discharge Instructions (Signed)

## 2014-10-09 NOTE — Op Note (Signed)
Preoperative diagnosis: Symptomatic ventral hernia.  Postoperative diagnosis: Multiple ventral hernias.  Operative procedure ventral hernia repair with preperitoneal Atrium ProLite mesh.  Operating surgeon: Ollen Bowl, M.D.  Anesthesia: Gen. endotracheal. Marcaine 0.5% with 1-200,000 units epinephrine, 30 mL local infiltration. Assessment blood loss: 10 mL.  Clinical note this 59 year old woman has developed a symptomatic ventral hernia that has increased in size after recent episode of bronchitis. She was admitted for elective repair. She received Kefzol intravenously as antibiotic prophylaxis. SCD stockings for DVT prevention.  Operative note: With the patient under adequate general endotracheal anesthesia the abdomen was prepped with chlor prep and drape. The hernia previously been marked in the holding area. An incision was made a proximally 6 inches in length beginning above the level of the umbilicus. The skin was incised sharply and the remaining dissection completed with electrocautery. The first hernia was identified and the sac dissected free. This was followed cephalad where through a small bridge of intact fascia a second similar sized defect was noted. This was also freed. Inferiorly there was evidence of a small umbilical hernia. This area was dissected to an location 2 cm below the umbilicus. A small rent in the peritoneum here was closed with a running 3-0 Vicryls suture. During mobilization of the left cephalad peritoneum a similar rent was repaired as well. With the preperitoneal space cleared measuring up proximally for an half inches square and Atrium mesh was brought onto the field. Corner sutures of 0 Maxon were placed. Making use of a Revierdan needle trans-fascial sutures were placed. The mesh was smoothed into position. It was anchored at the caudad and cephalad areas with transfixion sutures. The fascia was then approximated with interrupted 0 Maxon figure-of-eight sutures  grasping the mesh with each bite. Once the fascia was closed the adipose layer was closed with a running 2-0 Vicryls suture. The skin was closed with a running 4-0 Vicryls suture. The trans-fascial suture sites were closed with benzoin and Steri-Strips. Benzoin Steri-Strips were applied to the wound and Telfa and Tegaderm applied. A compressive abdominal binder was placed and the patient taken to recovery in stable condition.

## 2014-10-09 NOTE — H&P (Signed)
No change in cardio pulmonary symptoms. Lungs: Clear. Cardio: RR. Abd: Ventral hernia.  For hernia repair w/ mesh.

## 2014-10-09 NOTE — Anesthesia Preprocedure Evaluation (Signed)
Anesthesia Evaluation  Patient identified by MRN, date of birth, ID band Patient awake    Reviewed: Allergy & Precautions, H&P , NPO status , Patient's Chart, lab work & pertinent test results, reviewed documented beta blocker date and time   History of Anesthesia Complications Negative for: history of anesthetic complications  Airway Mallampati: II  TM Distance: >3 FB Neck ROM: full    Dental no notable dental hx. (+) Partial Upper   Pulmonary neg shortness of breath, asthma , neg sleep apnea, neg COPDneg recent URI, former smoker,  breath sounds clear to auscultation  Pulmonary exam normal       Cardiovascular Exercise Tolerance: Good hypertension, - angina- CAD, - Past MI, - Cardiac Stents and - CABG Normal cardiovascular exam- dysrhythmias - Valvular Problems/MurmursRhythm:regular Rate:Normal     Neuro/Psych PSYCHIATRIC DISORDERS (depression)  Neuromuscular disease (fibromyalgia)    GI/Hepatic negative GI ROS, Neg liver ROS,   Endo/Other  diabetes, Type 2, Oral Hypoglycemic Agents  Renal/GU CRFRenal disease  negative genitourinary   Musculoskeletal   Abdominal   Peds  Hematology negative hematology ROS (+)   Anesthesia Other Findings Past Medical History:   Hyperlipidemia                                               Hypertension                                                 Diabetes mellitus                                              Comment:Type 2   Vitamin D deficiency                                         Insomnia                                                     Depression                                                   Menopause                                                    Arthritis                                                    Colon polyp  2009         Gout                                                         Fibromyalgia                                                  Reproductive/Obstetrics negative OB ROS                             Anesthesia Physical Anesthesia Plan  ASA: II  Anesthesia Plan: General   Post-op Pain Management:    Induction:   Airway Management Planned:   Additional Equipment:   Intra-op Plan:   Post-operative Plan:   Informed Consent: I have reviewed the patients History and Physical, chart, labs and discussed the procedure including the risks, benefits and alternatives for the proposed anesthesia with the patient or authorized representative who has indicated his/her understanding and acceptance.   Dental Advisory Given  Plan Discussed with: Anesthesiologist, CRNA and Surgeon  Anesthesia Plan Comments:         Anesthesia Quick Evaluation

## 2014-10-09 NOTE — Transfer of Care (Signed)
Immediate Anesthesia Transfer of Care Note  Patient: Cheryl Barr  Procedure(s) Performed: Procedure(s): HERNIA REPAIR VENTRAL ADULT (N/A) INSERTION OF MESH (N/A)  Patient Location: PACU  Anesthesia Type:General  Level of Consciousness: awake, alert  and oriented  Airway & Oxygen Therapy: Patient Spontanous Breathing and Patient connected to face mask oxygen  Post-op Assessment: Report given to RN and Post -op Vital signs reviewed and stable  Post vital signs: Reviewed and stable  Last Vitals:  Filed Vitals:   10/09/14 0926  BP: 135/76  Pulse: 73  Temp: 36.7 C  Resp: 12    Complications: No apparent anesthesia complications

## 2014-10-15 ENCOUNTER — Encounter: Payer: Self-pay | Admitting: General Surgery

## 2014-10-15 ENCOUNTER — Ambulatory Visit (INDEPENDENT_AMBULATORY_CARE_PROVIDER_SITE_OTHER): Payer: Commercial Managed Care - PPO | Admitting: General Surgery

## 2014-10-15 VITALS — BP 150/80 | HR 66 | Resp 14 | Ht 67.0 in | Wt 216.0 lb

## 2014-10-15 DIAGNOSIS — K439 Ventral hernia without obstruction or gangrene: Secondary | ICD-10-CM

## 2014-10-15 NOTE — Progress Notes (Signed)
Patient ID: Cheryl Barr, female   DOB: Oct 24, 1955, 60 y.o.   MRN: [id number]  Chief Complaint  Patient presents with  . Routine Post Op    HPI Cheryl Barr is a 59 y.o. female.  Here today for her postoperative visit, ventral hernia repair on 10-09-14. She stes she is doing well. Aleve is controlling her pain. Bowels are moving normal.  HPI  Past Medical History  Diagnosis Date  . Hyperlipidemia   . Hypertension   . Diabetes mellitus     Type 2  . Vitamin D deficiency   . Insomnia   . Depression   . Menopause   . Arthritis   . Colon polyp 2009  . Gout   . Fibromyalgia     Past Surgical History  Procedure Laterality Date  . Colonoscopy  2009    1 polyp found. Dr. Roselyn Reef  . Breast surgery Left 2015  . Strabismus surgery      Repair as a child  . Knee surgery Left 01/14/2009  . Tubal ligation  1990  . Ventral hernia repair N/A 10/09/2014    Procedure: HERNIA REPAIR VENTRAL ADULT;  Surgeon: Robert Bellow, MD;  Location: [facility] ORS;  Service: General;  Laterality: N/A;  . Insertion of mesh N/A 10/09/2014    Procedure: INSERTION OF MESH;  Surgeon: Robert Bellow, MD;  Location: [facility] ORS;  Service: General;  Laterality: N/A;  . Hernia repair  [id number]    Ventral/umbilical hernia repair with 10 x 10 cm Atrium mesh in preperitoneal position.    Family History  Problem Relation Age of Onset  . Hypertension Mother   . Diabetes Mother   . Heart attack Father   . Coronary artery disease Father   . Breast cancer Father 61  . Allergies Son     Barista  . Diabetes Sister   . Diabetes Brother   . Gout Brother     Social History Social History  Substance Use Topics  . Smoking status: Former Smoker    Types: Cigarettes    Quit date: 10/02/1983  . Smokeless tobacco: Never Used  . Alcohol Use: No    No Known Allergies  Current Outpatient Prescriptions  Medication Sig Dispense Refill  . albuterol (PROVENTIL HFA;VENTOLIN HFA) 108 (90 BASE) MCG/ACT  inhaler Inhale 2 puffs for 3 nights then inhale 2 puffs every 4 hours as needed. 1 Inhaler 2  . allopurinol (ZYLOPRIM) 100 MG tablet Take 1 tablet (100 mg total) by mouth daily. 90 tablet 1  . amLODipine-valsartan (EXFORGE) 10-320 MG per tablet Take 1 tablet by mouth daily. 90 tablet 1  . aspirin 81 MG EC tablet Take 81 mg by mouth daily.      Marland Kitchen atorvastatin (LIPITOR) 40 MG tablet Take 1 tablet (40 mg total) by mouth daily. 90 tablet 1  . beclomethasone (QVAR) 80 MCG/ACT inhaler Inhale 1 puff into the lungs daily. 1 Inhaler 5  . glucose blood (FREESTYLE LITE) test strip FREESTYLE LITE TEST (In Vitro Strip)  check fsbs once a day for 0 days  Quantity: 100.00;  Refills: 5   Ordered :31-Dec-2009  Steele Sizer MD;  Dani Gobble Active Comments: DX: 250.00    . metFORMIN (GLUCOPHAGE) 1000 MG tablet Take 1 tablet (1,000 mg total) by mouth 2 (two) times daily with a meal. 180 tablet 1  . Multiple Vitamins-Minerals (CENTRUM SILVER ULTRA WOMENS) TABS Take 1 tablet by mouth daily.     No current facility-administered medications for this  visit.    Review of Systems Review of Systems  Constitutional: Negative.   Respiratory: Negative.   Cardiovascular: Negative.     Blood pressure 150/80, pulse 66, resp. rate 14, height 5\' 7"  (1.702 m), weight 216 lb (97.977 kg).  Physical Exam Physical Exam  Constitutional: She is oriented to person, place, and time. She appears well-developed and well-nourished.  HENT:  Mouth/Throat: Oropharynx is clear and moist.  Abdominal: Soft. Normal appearance. There is no tenderness.    Abdominal incision healing well, minimal swelling. Steri strips intact.  Neurological: She is alert and oriented to person, place, and time.  Skin: Skin is warm and dry.  Psychiatric: Her behavior is normal.     Assessment    Doing well status post repair of multiple ventral/umbilical hernias.    Plan    The patient will return to work on September 6. We'll  keep her weight lifting below 10 pounds for the next 4 weeks.    Wear abdominal binder during the day, may remove at night. Activity as tolerated. Proper lifting techniques reviewed. Follow up 3 weeks.  PCP:  Frazier Butt 10/15/2014, 11:09 AM

## 2014-11-06 ENCOUNTER — Ambulatory Visit (INDEPENDENT_AMBULATORY_CARE_PROVIDER_SITE_OTHER): Payer: Commercial Managed Care - PPO | Admitting: General Surgery

## 2014-11-06 VITALS — BP 142/76 | HR 74 | Resp 12 | Ht 67.0 in | Wt 220.0 lb

## 2014-11-06 DIAGNOSIS — K439 Ventral hernia without obstruction or gangrene: Secondary | ICD-10-CM

## 2014-11-06 NOTE — Progress Notes (Addendum)
Patient ID: Cheryl Barr, female   DOB: 1955/08/04, 59 y.o.   MRN: [id number]  Chief Complaint  Patient presents with  . Routine Post Op    ventral hernia    HPI Cheryl Barr is a 59 y.o. female here today for her post op ventral hernia repair done on 10/09/14. Patient states she is having a burning  pain at her ventral hernia site.  HPI  Past Medical History  Diagnosis Date  . Hyperlipidemia   . Hypertension   . Diabetes mellitus     Type 2  . Vitamin D deficiency   . Insomnia   . Depression   . Menopause   . Arthritis   . Colon polyp 2009  . Gout   . Fibromyalgia     Past Surgical History  Procedure Laterality Date  . Colonoscopy  2009    1 polyp found. Dr. Roselyn Reef  . Breast surgery Left 2015  . Strabismus surgery      Repair as a child  . Knee surgery Left 01/14/2009  . Tubal ligation  1990  . Ventral hernia repair N/A 10/09/2014    Procedure: HERNIA REPAIR VENTRAL ADULT;  Surgeon: Robert Bellow, MD;  Location: [facility] OR[facility];  Service: General;  Laterality: N/A;  . Insertion of mesh N/A 10/09/2014    Procedure: INSERTION OF MESH;  Surgeon: Robert Bellow, MD;  Location: [facility] OR[facility];  Service: General;  Laterality: N/A;  . Hernia repair  [id number]    Ventral/umbilical hernia repair with 10 x 10 cm Atrium mesh in preperitoneal position.    Family History  Problem Relation Age of Onset  . Hypertension Mother   . Diabetes Mother   . Heart attack Father   . Coronary artery disease Father   . Breast cancer Father 63  . Allergies Son     Barista  . Diabetes Sister   . Diabetes Brother   . Gout Brother     Social History Social History  Substance Use Topics  . Smoking status: Former Smoker    Types: Cigarettes    Quit date: 10/02/1983  . Smokeless tobacco: Never Used  . Alcohol Use: No    No Known Allergies  Current Outpatient Prescriptions  Medication Sig Dispense Refill  . allopurinol (ZYLOPRIM) 100 MG tablet Take 1 tablet (100 mg total) by  mouth daily. 90 tablet 1  . amLODipine-valsartan (EXFORGE) 10-320 MG per tablet Take 1 tablet by mouth daily. 90 tablet 1  . aspirin 81 MG EC tablet Take 81 mg by mouth daily.      Marland Kitchen atorvastatin (LIPITOR) 40 MG tablet Take 1 tablet (40 mg total) by mouth daily. 90 tablet 1  . glucose blood (FREESTYLE LITE) test strip FREESTYLE LITE TEST (In Vitro Strip)  check fsbs once a day for 0 days  Quantity: 100.00;  Refills: 5   Ordered :31-Dec-2009  Steele Sizer MD;  Dani Gobble Active Comments: DX: 250.00    . metFORMIN (GLUCOPHAGE) 1000 MG tablet Take 1 tablet (1,000 mg total) by mouth 2 (two) times daily with a meal. 180 tablet 1  . Multiple Vitamins-Minerals (CENTRUM SILVER ULTRA WOMENS) TABS Take 1 tablet by mouth daily.     Current Facility-Administered Medications  Medication Dose Route Frequency Provider Last Rate Last Dose  . dexamethasone (DECADRON) injection 10 mg  10 mg Intravenous Once Steele Sizer, MD        Review of Systems Review of Systems  Constitutional: Negative.   Respiratory: Negative.  Cardiovascular: Negative.     Blood pressure 142/76, pulse 74, resp. rate 12, height 5\' 7"  (1.702 m), weight 220 lb (99.791 kg).  Physical Exam Physical Exam  Constitutional: She is oriented to person, place, and time. She appears well-developed and well-nourished.  Abdominal:  Ventral hernia site is clean and healing well.   Neurological: She is alert and oriented to person, place, and time.  Skin: Skin is warm and dry.      Assessment    Doing well status post ventral hernia repair.    Plan    Care with lifting was recommended, proper lifting technique demonstrated.  Patient to return as needed.    PCP:  Frazier Butt 11/23/2014, 8:24 AM

## 2014-11-06 NOTE — Patient Instructions (Signed)
Patient to return as needed. Proper lifting techniques reviewed. 

## 2014-11-11 ENCOUNTER — Ambulatory Visit (INDEPENDENT_AMBULATORY_CARE_PROVIDER_SITE_OTHER): Payer: Commercial Managed Care - PPO | Admitting: Family Medicine

## 2014-11-11 ENCOUNTER — Encounter: Payer: Self-pay | Admitting: Family Medicine

## 2014-11-11 VITALS — BP 136/82 | HR 105 | Temp 98.6°F | Resp 18 | Ht 67.0 in | Wt 218.9 lb

## 2014-11-11 DIAGNOSIS — Z23 Encounter for immunization: Secondary | ICD-10-CM

## 2014-11-11 DIAGNOSIS — N058 Unspecified nephritic syndrome with other morphologic changes: Secondary | ICD-10-CM | POA: Diagnosis not present

## 2014-11-11 DIAGNOSIS — E1129 Type 2 diabetes mellitus with other diabetic kidney complication: Secondary | ICD-10-CM

## 2014-11-11 DIAGNOSIS — E785 Hyperlipidemia, unspecified: Secondary | ICD-10-CM

## 2014-11-11 DIAGNOSIS — M1712 Unilateral primary osteoarthritis, left knee: Secondary | ICD-10-CM | POA: Diagnosis not present

## 2014-11-11 DIAGNOSIS — I10 Essential (primary) hypertension: Secondary | ICD-10-CM

## 2014-11-11 LAB — POCT GLYCOSYLATED HEMOGLOBIN (HGB A1C): HEMOGLOBIN A1C: 6.6

## 2014-11-11 MED ORDER — LIDOCAINE HCL (PF) 1 % IJ SOLN
2.0000 mL | Freq: Once | INTRAMUSCULAR | Status: AC
  Administered 2014-11-11: 2 mL via INTRADERMAL

## 2014-11-11 MED ORDER — DEXAMETHASONE SODIUM PHOSPHATE 100 MG/10ML IJ SOLN
8.0000 mg | Freq: Once | INTRAMUSCULAR | Status: AC
  Administered 2014-11-11: 8 mg via INTRAMUSCULAR

## 2014-11-11 MED ORDER — DEXAMETHASONE SODIUM PHOSPHATE 10 MG/ML IJ SOLN: 10.0000 mg | Freq: Once | INTRAMUSCULAR | Status: DC

## 2014-11-11 NOTE — Progress Notes (Signed)
Name: Cheryl Barr   MRN: [id number]    DOB: 1955-05-30   Date:11/11/2014       Progress Note  Subjective  Chief Complaint  Chief Complaint  Patient presents with  . Medication Refill    3 month F/U  . Diabetes    checks 1-2x weekly low-118, ang-128, [id number]  . Hypertension  . Hyperlipidemia  . Leg Pain    onset 2 week improving (shin)    HPI   HTN: she is complaint with her medication, denies side effects, no chest pain, palpitation or SOB.  BP is at goal  Hyperlipidemia: last LDL was at goal, continue high dose statin therapy, denies side effects of medication and is compliant with medications .   DMII with microalbuminuria: urine micro was 100 in Feb 2016, she is on ARB - valsartan 353m , aspirin, and metfomin. She denies side effects, hgbA1C today was 6.6% . Eye exam is up to date. No polyphagia, , polydipsia or polyuria, she has nocturia, but she states secondary to hot flashes and needs to drink water during the night to control her body temperature  Gout: . She is back on Allopurinol now . Doing well at this time.  [facility]. Unknown cause of cough, was given Qvar and Proventil, but she states symptoms resolved and she never got the Qvar filled. He also tried her on PPI. Asymptomatic at this time.   OA left knee: she has a long history of OA, had steroid injection on the left knee years ago. She states pain is now daily, makes her had an antalgic gait and now also has right lower leg pain ( lateral aspect ) . No swelling or redness. Pain is worse with activity and improves with rest. She has been taking otc Aleve with no improvement  Patient Active Problem List   Diagnosis Date Noted  . Hernia of abdominal cavity 09/15/2014  . Impaired renal function 08/10/2014  . 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 Dyslipidemia 08/10/2014  . Interval gout 08/10/2014  . Microalbuminuria 08/10/2014  . Morbid obesity 08/10/2014  .  Osteoarthritis of left knee 08/10/2014  . Vitamin D deficiency 08/10/2014  . Cough 01/21/2014  . Intraductal papilloma of breast 06/27/2013  . Hypertension, essential, benign 09/02/2009  . ABNORMAL EKG 09/02/2009    Past Surgical History  Procedure Laterality Date  . Colonoscopy  2009    1 polyp found. Dr. WRoselyn Reef . Breast surgery Left 2015  . Strabismus surgery      Repair as a child  . Knee surgery Left 01/14/2009  . Tubal ligation  1990  . Ventral hernia repair N/A 10/09/2014    Procedure: HERNIA REPAIR VENTRAL ADULT;  Surgeon: JRobert Bellow MD;  Location: [facility] ORS;  Service: General;  Laterality: N/A;  . Insertion of mesh N/A 10/09/2014    Procedure: INSERTION OF MESH;  Surgeon: JRobert Bellow MD;  Location: [facility] ORS;  Service: General;  Laterality: N/A;  . Hernia repair  [id number]   Ventral/umbilical hernia repair with 10 x 10 cm Atrium mesh in preperitoneal position.    Family History  Problem Relation Age of Onset  . Hypertension Mother   . Diabetes Mother   . Heart attack Father   . Coronary artery disease Father   . Breast cancer Father 676 . Allergies Son     FBarista . Diabetes Sister   . Diabetes Brother   .  Gout Brother     Social History   Social History  . Marital Status: Widowed    Spouse Name: N/A  . Number of Children: 3  . Years of Education: 12th Grade   Occupational History  .  [employer]   Social History Main Topics  . Smoking status: Former Smoker    Types: Cigarettes    Quit date: 10/02/1983  . Smokeless tobacco: Never Used  . Alcohol Use: No  . Drug Use: No  . Sexual Activity: Yes   Other Topics Concern  . Not on file   Social History Narrative   Widowed.    No regular exercise.     Current outpatient prescriptions:  .  allopurinol (ZYLOPRIM) 100 MG tablet, Take 1 tablet (100 mg total) by mouth daily., Disp: 90 tablet, Rfl: 1 .  amLODipine-valsartan (EXFORGE) 10-320 MG per tablet, Take 1 tablet by mouth  daily., Disp: 90 tablet, Rfl: 1 .  aspirin 81 MG EC tablet, Take 81 mg by mouth daily.  , Disp: , Rfl:  .  atorvastatin (LIPITOR) 40 MG tablet, Take 1 tablet (40 mg total) by mouth daily., Disp: 90 tablet, Rfl: 1 .  glucose blood (FREESTYLE LITE) test strip, FREESTYLE LITE TEST (In Vitro Strip)  check fsbs once a day for 0 days  Quantity: 100.00;  Refills: 5   Ordered :31-Dec-2009  Steele Sizer MD;  Dani Gobble Active Comments: DX: 250.00, Disp: , Rfl:  .  metFORMIN (GLUCOPHAGE) 1000 MG tablet, Take 1 tablet (1,000 mg total) by mouth 2 (two) times daily with a meal., Disp: 180 tablet, Rfl: 1 .  Multiple Vitamins-Minerals (CENTRUM SILVER ULTRA WOMENS) TABS, Take 1 tablet by mouth daily., Disp: , Rfl:   No Known Allergies   ROS  Constitutional: Negative for fever or weight change.  Respiratory: Negative for cough and shortness of breath.   Cardiovascular: Negative for chest pain or palpitations.  Gastrointestinal: Negative for abdominal pain, no bowel changes.  Musculoskeletal: Positive  for gait problem but no joint swelling.  Skin: Negative for rash.  Neurological: Negative for dizziness or headache.  No other specific complaints in a complete review of systems (except as listed in HPI above).  Objective  Filed Vitals:   11/11/14 1606  BP: 136/82  Pulse: 105  Temp: 98.6 F (37 C)  TempSrc: Oral  Resp: 18  Height: _0  (1.702 m)  Weight: 218 lb 14.4 oz (99.292 kg)  SpO2: 96%    Body mass index is 34.28 kg/(m^2).  Physical Exam  Constitutional: Patient appears well-developed and well-nourished. Obese No distress.  HEENT: head atraumatic, normocephalic, pupils equal and reactive to light, neck supple, throat within normal limits Cardiovascular: Normal rate, regular rhythm and normal heart sounds.  No murmur heard. No BLE edema. Pulmonary/Chest: Effort normal and breath sounds normal. No respiratory distress. Abdominal: Soft.  There is no tenderness. Psychiatric:  Patient has a normal mood and affect. behavior is normal. Judgment and thought content normal. Muscular Skeletal: pain and crepitus with extension of left knee, no effusion or redness, some discomfort during palpation of left lateral lower leg - seems muscular, likely from antalgic gait  Recent Results (from the past 2160 hour(s))  Basic metabolic panel     Status: Abnormal   Collection Time: 10/06/14  9:27 AM  Result Value Ref Range   Sodium 142 135 - 145 mmol/L   Potassium 3.6 3.5 - 5.1 mmol/L   Chloride 105 101 - 111 mmol/L   CO2 28  22 - 32 mmol/L   Glucose, Bld 120 (H) 65 - 99 mg/dL   BUN 18 6 - 20 mg/dL   Creatinine, Ser 0.83 0.44 - 1.00 mg/dL   Calcium 9.6 8.9 - 10.3 mg/dL   GFR calc non Af Amer >60 >60 mL/min   GFR calc Af Amer >60 >60 mL/min    Comment: (NOTE) The eGFR has been calculated using the CKD EPI equation. This calculation has not been validated in all clinical situations. eGFR's persistently <60 mL/min signify possible Chronic Kidney Disease.    Anion gap 9 5 - 15  Glucose, capillary     Status: Abnormal   Collection Time: 10/09/14  6:17 AM  Result Value Ref Range   Glucose-Capillary 126 (H) 65 - 99 mg/dL   Comment 1 Notify RN   Glucose, capillary     Status: Abnormal   Collection Time: 10/09/14  9:48 AM  Result Value Ref Range   Glucose-Capillary 148 (H) 65 - 99 mg/dL  POCT HgB A1C     Status: None   Collection Time: 11/11/14  4:16 PM  Result Value Ref Range   Hemoglobin A1C 6.6      PHQ2/9: Depression screen [facility] 2/9 11/11/2014 08/11/2014  Decreased Interest 0 0  Down, Depressed, Hopeless 0 0  PHQ - 2 Score 0 0     Fall Risk: Fall Risk  11/11/2014 08/11/2014  Falls in the past year? No No      Functional Status Survey: Is the patient deaf or have difficulty hearing?: No Does the patient have difficulty seeing, even when wearing glasses/contacts?: Yes (glasses) Does the patient have difficulty concentrating, remembering, or making decisions?:  No Does the patient have difficulty walking or climbing stairs?: No Does the patient have difficulty dressing or bathing?: No Does the patient have difficulty doing errands alone such as visiting a doctor's office or shopping?: No    Assessment & Plan  1. 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 Doing well, continue ARB also  - POCT HgB A1C  2. Needs flu shot  - Flu Vaccine QUAD 36+ mos PF IM (Fluarix & Fluzone Quad PF) - will get at work because of incentive   3. Hypertension, essential, benign    at goal   4. Primary osteoarthritis of left knee    Consent signed: YES  Procedure: Knee Joint Injection Location: left knee Injection approach: lateral knee Equipment used: 25 gauge 1.5 inch needle Medication: 2 mL Kenalog (60m/1mL) Anesthesia: 1% Lidocaine w/o Epinephrine Cleaned and prepped: Betadine  The risks, benefits, treatment options discussed with patient prior to procedure.  Consent signed. Area cleansed with sterile betadine. .    Patient tolerated procedure well with no complications and no bleeding. Bandage placed at site of injection. Patient instructed on potential for steroid reaction pain within the initial 24-48hr period. May use ice packs directly on injected site as needed.  5. Dyslipidemia  Continue medication   6. Morbid obesity  Discussed with the patient the risk posed by an increased BMI. Discussed importance of portion control, calorie counting and at least 150 minutes of physical activity weekly. Avoid sweet beverages and drink more water. Eat at least 6 servings of fruit and vegetables daily

## 2015-02-12 ENCOUNTER — Encounter: Payer: Self-pay | Admitting: Family Medicine

## 2015-02-12 ENCOUNTER — Ambulatory Visit (INDEPENDENT_AMBULATORY_CARE_PROVIDER_SITE_OTHER): Payer: Commercial Managed Care - PPO | Admitting: Family Medicine

## 2015-02-12 ENCOUNTER — Telehealth: Payer: Self-pay

## 2015-02-12 VITALS — BP 144/82 | HR 90 | Temp 98.3°F | Resp 16 | Ht 67.0 in | Wt 218.0 lb

## 2015-02-12 DIAGNOSIS — N182 Chronic kidney disease, stage 2 (mild): Secondary | ICD-10-CM

## 2015-02-12 DIAGNOSIS — E1121 Type 2 diabetes mellitus with diabetic nephropathy: Secondary | ICD-10-CM

## 2015-02-12 DIAGNOSIS — I10 Essential (primary) hypertension: Secondary | ICD-10-CM | POA: Diagnosis not present

## 2015-02-12 DIAGNOSIS — Z23 Encounter for immunization: Secondary | ICD-10-CM | POA: Diagnosis not present

## 2015-02-12 DIAGNOSIS — E785 Hyperlipidemia, unspecified: Secondary | ICD-10-CM | POA: Diagnosis not present

## 2015-02-12 DIAGNOSIS — E1122 Type 2 diabetes mellitus with diabetic chronic kidney disease: Secondary | ICD-10-CM

## 2015-02-12 DIAGNOSIS — M109 Gout, unspecified: Secondary | ICD-10-CM

## 2015-02-12 LAB — POCT UA - MICROALBUMIN: MICROALBUMIN (UR) POC: 100 mg/L

## 2015-02-12 LAB — POCT GLYCOSYLATED HEMOGLOBIN (HGB A1C): Hemoglobin A1C: 6.5

## 2015-02-12 MED ORDER — ATORVASTATIN CALCIUM 40 MG PO TABS: 40.0000 mg | ORAL_TABLET | Freq: Every day | ORAL | Status: DC

## 2015-02-12 MED ORDER — AMLODIPINE BESYLATE-VALSARTAN 10-320 MG PO TABS: 1.0000 | ORAL_TABLET | Freq: Every day | ORAL | Status: DC

## 2015-02-12 MED ORDER — ALLOPURINOL 100 MG PO TABS: 100.0000 mg | ORAL_TABLET | Freq: Every day | ORAL | Status: DC

## 2015-02-12 MED ORDER — CARVEDILOL 6.25 MG PO TABS: 6.2500 mg | ORAL_TABLET | Freq: Two times a day (BID) | ORAL | Status: DC

## 2015-02-12 MED ORDER — METFORMIN HCL 1000 MG PO TABS: 1000.0000 mg | ORAL_TABLET | Freq: Two times a day (BID) | ORAL | Status: DC

## 2015-02-12 NOTE — Telephone Encounter (Signed)
Patient needs 3 month f/u

## 2015-02-12 NOTE — Progress Notes (Signed)
Name: Cheryl Barr   MRN: [id number]    DOB: 1955/05/12   Date:02/12/2015       Progress Note  Subjective  Chief Complaint  Chief Complaint  Patient presents with  . Medication Refill    3 month F/U  . Diabetes    Checks weekly, Averages from 124-135  . Gout    Well controlled  . Hypertension  . Hyperlipidemia  . Osteoarthritis    Right knee has been painful    HPI   HTN: she is complaint with her medication, denies side effects, no chest pain, palpitation or SOB. BP is elevated today, she is willing to add another medication since bp is towards the high end of normal   Hyperlipidemia: last LDL was at goal, continue high dose statin therapy, denies side effects of medication and is compliant with medications .   DMII with microalbuminuria: urine micro was 100 in Feb 2016 and again today , she is on ARB - valsartan 320mg  , aspirin, and Metfomin. She denies side effects, hgbA1C today  Is 6.5% . Eye exam is up to date. No polyphagia, , polydipsia or polyuria, she has nocturia, but she states secondary to hot flashes and needs to drink water during the night to control her body temperature. She is due for eye exam, she will schedule her appointment with ophtalmologist   Gout: . She is back on Allopurinol now . Doing well at this time.No recent episodes  OA left knee: she has a long history of OA, had steroid injection on the left knee on her last visit in September. She states pain is now better, no longer daily, unless if she works long hours, no longer limping.  Marland Kitchen No swelling or redness. Pain is worse with activity and improves with rest. She has been taking Aleve prn, but advised to stop because of proteinuria and try Tylenol   Patient Active Problem List   Diagnosis Date Noted  . Hernia of abdominal cavity 09/15/2014  . Impaired renal function 08/10/2014  . Controlled type 2 diabetes with renal manifestation ([place]) 08/10/2014  . Dyslipidemia 08/10/2014  . Interval gout  08/10/2014  . Microalbuminuria 08/10/2014  . Morbid obesity ([place]) 08/10/2014  . Osteoarthritis of left knee 08/10/2014  . Vitamin D deficiency 08/10/2014  . Cough 01/21/2014  . Intraductal papilloma of breast 06/27/2013  . Hypertension, essential, benign 09/02/2009  . ABNORMAL EKG 09/02/2009    Past Surgical History  Procedure Laterality Date  . Colonoscopy  2009    1 polyp found. Dr. Roselyn Reef  . Breast surgery Left 2015  . Strabismus surgery      Repair as a child  . Knee surgery Left 01/14/2009  . Tubal ligation  1990  . Ventral hernia repair N/A 10/09/2014    Procedure: HERNIA REPAIR VENTRAL ADULT;  Surgeon: Robert Bellow, MD;  Location: [facility] ORS;  Service: General;  Laterality: N/A;  . Insertion of mesh N/A 10/09/2014    Procedure: INSERTION OF MESH;  Surgeon: Robert Bellow, MD;  Location: [facility] ORS;  Service: General;  Laterality: N/A;  . Hernia repair  [id number]    Ventral/umbilical hernia repair with 10 x 10 cm Atrium mesh in preperitoneal position.    Family History  Problem Relation Age of Onset  . Hypertension Mother   . Diabetes Mother   . Heart attack Father   . Coronary artery disease Father   . Breast cancer Father 32  . Allergies Son     Haematologist and  [employer]  . Diabetes Sister   . Diabetes Brother   . Gout Brother     Social History   Social History  . Marital Status: Widowed    Spouse Name: N/A  . Number of Children: 3  . Years of Education: 12th Grade   Occupational History  .  [employer]   Social History Main Topics  . Smoking status: Former Smoker    Types: Cigarettes    Quit date: 10/02/1983  . Smokeless tobacco: Never Used  . Alcohol Use: No  . Drug Use: No  . Sexual Activity: Yes   Other Topics Concern  . Not on file   Social History Narrative   Widowed.    No regular exercise.     Current outpatient prescriptions:  .  allopurinol (ZYLOPRIM) 100 MG tablet, Take 1 tablet (100 mg total) by mouth daily., Disp: 90 tablet, Rfl:  1 .  amLODipine-valsartan (EXFORGE) 10-320 MG tablet, Take 1 tablet by mouth daily., Disp: 90 tablet, Rfl: 1 .  aspirin 81 MG EC tablet, Take 81 mg by mouth daily.  , Disp: , Rfl:  .  atorvastatin (LIPITOR) 40 MG tablet, Take 1 tablet (40 mg total) by mouth daily., Disp: 90 tablet, Rfl: 1 .  glucose blood (FREESTYLE LITE) test strip, FREESTYLE LITE TEST (In Vitro Strip)  check fsbs once a day for 0 days  Quantity: 100.00;  Refills: 5   Ordered :31-Dec-2009  Steele Sizer MD;  Dani Gobble Active Comments: DX: 250.00, Disp: , Rfl:  .  LANCETS ULTRA THIN 30G MISC, , Disp: , Rfl:  .  metFORMIN (GLUCOPHAGE) 1000 MG tablet, Take 1 tablet (1,000 mg total) by mouth 2 (two) times daily with a meal., Disp: 180 tablet, Rfl: 1 .  Multiple Vitamins-Minerals (CENTRUM SILVER ULTRA WOMENS) TABS, Take 1 tablet by mouth daily., Disp: , Rfl:  .  carvedilol (COREG) 6.25 MG tablet, Take 1 tablet (6.25 mg total) by mouth 2 (two) times daily with a meal., Disp: 180 tablet, Rfl: 0  Current facility-administered medications:  .  dexamethasone (DECADRON) injection 10 mg, 10 mg, Intravenous, Once, Steele Sizer, MD  No Known Allergies   ROS  Constitutional: Negative for fever or weight change.  Respiratory: Negative for cough and shortness of breath.   Cardiovascular: Negative for chest pain or palpitations.  Gastrointestinal: Negative for abdominal pain, no bowel changes.  Musculoskeletal: Negative for gait problem or joint swelling.  Skin: Negative for rash.  Neurological: Negative for dizziness or headache.  No other specific complaints in a complete review of systems (except as listed in HPI above).  Objective  Filed Vitals:   02/12/15 1659  BP: 144/82  Pulse: 90  Temp: 98.3 F (36.8 C)  TempSrc: Oral  Resp: 16  Height: 5\' 7"  (1.702 m)  Weight: 218 lb (98.884 kg)  SpO2: 96%    Body mass index is 34.14 kg/(m^2).  Physical Exam  Constitutional: Patient appears well-developed and  well-nourished. Obese  No distress.  HEENT: head atraumatic, normocephalic, pupils equal and reactive to light,  neck supple, throat within normal limits Cardiovascular: Normal rate, regular rhythm and normal heart sounds.  No murmur heard. No BLE edema. Pulmonary/Chest: Effort normal and breath sounds normal. No respiratory distress. Abdominal: Soft.  There is no tenderness. Psychiatric: Patient has a normal mood and affect. behavior is normal. Judgment and thought content normal. Muscular Skeletal: crepitus with extension of both knees  Recent Results (from the past 2160 hour(s))  POCT HgB A1C  Status: None   Collection Time: 02/12/15  5:02 PM  Result Value Ref Range   Hemoglobin A1C 6.5   POCT UA - Microalbumin     Status: Abnormal   Collection Time: 02/12/15  5:02 PM  Result Value Ref Range   Microalbumin Ur, POC 100 mg/L   Creatinine, POC  mg/dL   Albumin/Creatinine Ratio, Urine, POC      PHQ2/9: Depression screen [facility] 2/9 02/12/2015 11/11/2014 08/11/2014  Decreased Interest 0 0 0  Down, Depressed, Hopeless 0 0 0  PHQ - 2 Score 0 0 0    Fall Risk: Fall Risk  02/12/2015 11/11/2014 08/11/2014  Falls in the past year? No No No    Functional Status Survey: Is the patient deaf or have difficulty hearing?: No Does the patient have difficulty seeing, even when wearing glasses/contacts?: Yes (glasses) Does the patient have difficulty concentrating, remembering, or making decisions?: No Does the patient have difficulty walking or climbing stairs?: No Does the patient have difficulty dressing or bathing?: No Does the patient have difficulty doing errands alone such as visiting a doctor's office or shopping?: No    Assessment & Plan  1. Controlled type 2 diabetes mellitus with stage 2 chronic kidney disease, without long-term current use of insulin (HCC)  Continue medication, avoid nsaid's - POCT HgB A1C - POCT UA - Microalbumin  2. Controlled type 2 diabetes mellitus with  diabetic nephropathy, without long-term current use of insulin (HCC)  - metFORMIN (GLUCOPHAGE) 1000 MG tablet; Take 1 tablet (1,000 mg total) by mouth 2 (two) times daily with a meal.  Dispense: 180 tablet; Refill: 1  3. Dyslipidemia  - atorvastatin (LIPITOR) 40 MG tablet; Take 1 tablet (40 mg total) by mouth daily.  Dispense: 90 tablet; Refill: 1  4. Hypertension, essential, benign  - carvedilol (COREG) 6.25 MG tablet; Take 1 tablet (6.25 mg total) by mouth 2 (two) times daily with a meal.  Dispense: 180 tablet; Refill: 0 - amLODipine-valsartan (EXFORGE) 10-320 MG tablet; Take 1 tablet by mouth daily.  Dispense: 90 tablet; Refill: 1  5. Controlled gout  - allopurinol (ZYLOPRIM) 100 MG tablet; Take 1 tablet (100 mg total) by mouth daily.  Dispense: 90 tablet; Refill: 1  6. Need for pneumococcal vaccination  - Pneumococcal conjugate vaccine 13-valent IM

## 2015-02-13 NOTE — Telephone Encounter (Signed)
Appointment made for march

## 2015-05-07 ENCOUNTER — Encounter: Payer: Self-pay | Admitting: Family Medicine

## 2015-05-07 ENCOUNTER — Ambulatory Visit (INDEPENDENT_AMBULATORY_CARE_PROVIDER_SITE_OTHER): Payer: Commercial Managed Care - PPO | Admitting: Family Medicine

## 2015-05-07 VITALS — BP 142/72 | HR 72 | Temp 98.1°F | Resp 14 | Ht 67.0 in | Wt 215.9 lb

## 2015-05-07 DIAGNOSIS — N182 Chronic kidney disease, stage 2 (mild): Secondary | ICD-10-CM

## 2015-05-07 DIAGNOSIS — E1122 Type 2 diabetes mellitus with diabetic chronic kidney disease: Secondary | ICD-10-CM | POA: Diagnosis not present

## 2015-05-07 DIAGNOSIS — M109 Gout, unspecified: Secondary | ICD-10-CM

## 2015-05-07 DIAGNOSIS — E785 Hyperlipidemia, unspecified: Secondary | ICD-10-CM | POA: Diagnosis not present

## 2015-05-07 DIAGNOSIS — I10 Essential (primary) hypertension: Secondary | ICD-10-CM

## 2015-05-07 LAB — POCT GLYCOSYLATED HEMOGLOBIN (HGB A1C): Hemoglobin A1C: 6.5

## 2015-05-07 MED ORDER — AMLODIPINE BESYLATE-VALSARTAN 5-320 MG PO TABS: 1.0000 | ORAL_TABLET | Freq: Every day | ORAL | Status: DC

## 2015-05-07 MED ORDER — CARVEDILOL 6.25 MG PO TABS: 6.2500 mg | ORAL_TABLET | Freq: Two times a day (BID) | ORAL | Status: DC

## 2015-05-07 NOTE — Progress Notes (Signed)
Name: Cheryl Barr   MRN: [id number]    DOB: 12-03-1955   Date:05/07/2015       Progress Note  Subjective  Chief Complaint  Chief Complaint  Patient presents with  . Diabetes    patient checks 1x per week. high-142, low-125  . Hypertension    no problems with medicine.  . Gout    patient is here for a 14-month f/u  . Dyslipidemia  . Medication Refill    HPI  HTN: she is complaint with her medication, denies side effects, no chest pain, palpitation or SOB. BP is still elevated, however on her last visit, she thought she was supposed to stop the Exforge, but she has been taking Carvedilol twice daily as prescribed. BP at home or pharmacy is between 140's/150's.   Hyperlipidemia: last LDL was at goal, continue high dose statin therapy, denies side effects of medication and is compliant with medications .   DMII with microalbuminuria: urine micro was 100 in Dec 2016 and also Jan 2016 , she was on ARB - valsartan 320mg  , currently on  aspirin, and Metfomin. She denies side effects, hgbA1C has been under control  . Eye exam is not up to date - but she will schedule an appointment . No polyphagia, polydipsia or polyuria, she has nocturia, but she states secondary to hot flashes and needs to drink water during the night to control her body temperature.   Gout: . She is back on Allopurinol now . Doing well at this time.No recent episodes  Obesity: she has lost a few pounds since last visit, she has not changed her diet, but has been working longer hours, therefore more active.    Patient Active Problem List   Diagnosis Date Noted  . Hernia of abdominal cavity 09/15/2014  . Impaired renal function 08/10/2014  . Controlled type 2 diabetes with renal manifestation ([place]) 08/10/2014  . Dyslipidemia 08/10/2014  . Interval gout 08/10/2014  . Microalbuminuria 08/10/2014  . Morbid obesity ([place]) 08/10/2014  . Osteoarthritis of left knee 08/10/2014  . Vitamin D deficiency 08/10/2014  . Cough  01/21/2014  . Intraductal papilloma of breast 06/27/2013  . Hypertension, essential, benign 09/02/2009  . ABNORMAL EKG 09/02/2009    Past Surgical History  Procedure Laterality Date  . Colonoscopy  2009    1 polyp found. Dr. Roselyn Reef  . Breast surgery Left 2015  . Strabismus surgery      Repair as a child  . Knee surgery Left 01/14/2009  . Tubal ligation  1990  . Ventral hernia repair N/A 10/09/2014    Procedure: HERNIA REPAIR VENTRAL ADULT;  Surgeon: Robert Bellow, MD;  Location: [facility] ORS;  Service: General;  Laterality: N/A;  . Insertion of mesh N/A 10/09/2014    Procedure: INSERTION OF MESH;  Surgeon: Robert Bellow, MD;  Location: [facility] ORS;  Service: General;  Laterality: N/A;  . Hernia repair  [id number]    Ventral/umbilical hernia repair with 10 x 10 cm Atrium mesh in preperitoneal position.    Family History  Problem Relation Age of Onset  . Hypertension Mother   . Diabetes Mother   . Heart attack Father   . Coronary artery disease Father   . Breast cancer Father 60  . Allergies Son     Barista  . Diabetes Sister   . Diabetes Brother   . Gout Brother     Social History   Social History  . Marital Status: Widowed  Spouse Name: N/A  . Number of Children: 3  . Years of Education: 12th Grade   Occupational History  .  [employer]   Social History Main Topics  . Smoking status: Former Smoker    Types: Cigarettes    Quit date: 10/02/1983  . Smokeless tobacco: Never Used  . Alcohol Use: No  . Drug Use: No  . Sexual Activity: Yes   Other Topics Concern  . Not on file   Social History Narrative   Widowed.    No regular exercise.     Current outpatient prescriptions:  .  allopurinol (ZYLOPRIM) 100 MG tablet, Take 1 tablet (100 mg total) by mouth daily., Disp: 90 tablet, Rfl: 1 .  amLODipine-valsartan (EXFORGE) 5-320 MG tablet, Take 1 tablet by mouth daily., Disp: 30 tablet, Rfl: 0 .  aspirin 81 MG EC tablet, Take 81 mg by mouth daily.  ,  Disp: , Rfl:  .  atorvastatin (LIPITOR) 40 MG tablet, Take 1 tablet (40 mg total) by mouth daily., Disp: 90 tablet, Rfl: 1 .  carvedilol (COREG) 6.25 MG tablet, Take 1 tablet (6.25 mg total) by mouth 2 (two) times daily with a meal., Disp: 180 tablet, Rfl: 0 .  glucose blood (FREESTYLE LITE) test strip, FREESTYLE LITE TEST (In Vitro Strip)  check fsbs once a day for 0 days  Quantity: 100.00;  Refills: 5   Ordered :31-Dec-2009  Steele Sizer MD;  Dani Gobble Active Comments: DX: 250.00, Disp: , Rfl:  .  LANCETS ULTRA THIN 30G MISC, , Disp: , Rfl:  .  metFORMIN (GLUCOPHAGE) 1000 MG tablet, Take 1 tablet (1,000 mg total) by mouth 2 (two) times daily with a meal., Disp: 180 tablet, Rfl: 1 .  Multiple Vitamins-Minerals (CENTRUM SILVER ULTRA WOMENS) TABS, Take 1 tablet by mouth daily., Disp: , Rfl:   Current facility-administered medications:  .  dexamethasone (DECADRON) injection 10 mg, 10 mg, Intravenous, Once, Steele Sizer, MD  No Known Allergies   ROS  Constitutional: Negative for fever or weight change.  Respiratory: Negative for cough and shortness of breath.   Cardiovascular: Negative for chest pain or palpitations.  Gastrointestinal: Negative for abdominal pain, no bowel changes.  Musculoskeletal: Negative for gait problem or joint swelling.  Skin: Negative for rash.  Neurological: Negative for dizziness or headache.  No other specific complaints in a complete review of systems (except as listed in HPI above).  Objective  Filed Vitals:   05/07/15 0930  BP: 142/72  Pulse: 72  Temp: 98.1 F (36.7 C)  TempSrc: Oral  Resp: 14  Height: 5\' 7"  (1.702 m)  Weight: 215 lb 14.4 oz (97.932 kg)  SpO2: 97%    Body mass index is 33.81 kg/(m^2).  Physical Exam  Constitutional: Patient appears well-developed and well-nourished. Obese  No distress.  HEENT: head atraumatic, normocephalic, pupils equal and reactive to light, neck supple, throat within normal  limits Cardiovascular: Normal rate, regular rhythm and normal heart sounds.  No murmur heard. No BLE edema. Pulmonary/Chest: Effort normal and breath sounds normal. No respiratory distress. Abdominal: Soft.  There is no tenderness. Psychiatric: Patient has a normal mood and affect. behavior is normal. Judgment and thought content normal.  Recent Results (from the past 2160 hour(s))  POCT HgB A1C     Status: None   Collection Time: 02/12/15  5:02 PM  Result Value Ref Range   Hemoglobin A1C 6.5   POCT UA - Microalbumin     Status: Abnormal   Collection Time: 02/12/15  5:02 PM  Result Value Ref Range   Microalbumin Ur, POC 100 mg/L   Creatinine, POC  mg/dL   Albumin/Creatinine Ratio, Urine, POC    POCT glycosylated hemoglobin (Hb A1C)     Status: None   Collection Time: 05/07/15 10:06 AM  Result Value Ref Range   Hemoglobin A1C 6.5       PHQ2/9: Depression screen [facility] 2/9 05/07/2015 02/12/2015 11/11/2014 08/11/2014  Decreased Interest 0 0 0 0  Down, Depressed, Hopeless 0 0 0 0  PHQ - 2 Score 0 0 0 0     Fall Risk: Fall Risk  05/07/2015 02/12/2015 11/11/2014 08/11/2014  Falls in the past year? No No No No     Functional Status Survey: Is the patient deaf or have difficulty hearing?: No Does the patient have difficulty seeing, even when wearing glasses/contacts?: No Does the patient have difficulty concentrating, remembering, or making decisions?: No Does the patient have difficulty walking or climbing stairs?: No Does the patient have difficulty dressing or bathing?: No Does the patient have difficulty doing errands alone such as visiting a doctor's office or shopping?: No    Assessment & Plan  1. Controlled type 2 diabetes mellitus with stage 2 chronic kidney disease, without long-term current use of insulin (HCC)  - POCT glycosylated hemoglobin (Hb A1C) at goal, continue current regiment  2. Dyslipidemia  Continue statin therapy   3. Hypertension, essential,  benign  Resume Exforge, however at lower dose , since bp is not very high today, running high at home, she needs ARB, we will recheck her bp in one month - amLODipine-valsartan (EXFORGE) 5-320 MG tablet; Take 1 tablet by mouth daily.  Dispense: 30 tablet; Refill: 0 - carvedilol (COREG) 6.25 MG tablet; Take 1 tablet (6.25 mg total) by mouth 2 (two) times daily with a meal.  Dispense: 180 tablet; Refill: 0  4. Controlled gout  Continue Allopurinol   5. Morbid obesity, unspecified obesity type [facility])  Discussed with the patient the risk posed by an increased BMI. Discussed importance of portion control, calorie counting and at least 150 minutes of physical activity weekly. Avoid sweet beverages and drink more water. Eat at least 6 servings of fruit and vegetables daily

## 2015-05-14 ENCOUNTER — Ambulatory Visit: Payer: Commercial Managed Care - PPO | Admitting: Family Medicine

## 2015-06-05 ENCOUNTER — Encounter: Payer: Self-pay | Admitting: Family Medicine

## 2015-06-05 ENCOUNTER — Other Ambulatory Visit
Admission: RE | Admit: 2015-06-05 | Discharge: 2015-06-05 | Disposition: A | Discharge status: Home / Self Care | Payer: Commercial Managed Care - PPO | Source: Ambulatory Visit | Attending: Family Medicine | Admitting: Family Medicine

## 2015-06-05 ENCOUNTER — Ambulatory Visit (INDEPENDENT_AMBULATORY_CARE_PROVIDER_SITE_OTHER): Payer: Commercial Managed Care - PPO | Admitting: Family Medicine

## 2015-06-05 VITALS — BP 124/78 | HR 89 | Temp 98.5°F | Resp 16 | Wt 212.7 lb

## 2015-06-05 DIAGNOSIS — R634 Abnormal weight loss: Secondary | ICD-10-CM | POA: Insufficient documentation

## 2015-06-05 DIAGNOSIS — Z1239 Encounter for other screening for malignant neoplasm of breast: Secondary | ICD-10-CM

## 2015-06-05 DIAGNOSIS — E559 Vitamin D deficiency, unspecified: Secondary | ICD-10-CM | POA: Diagnosis not present

## 2015-06-05 DIAGNOSIS — M109 Gout, unspecified: Secondary | ICD-10-CM | POA: Insufficient documentation

## 2015-06-05 DIAGNOSIS — E785 Hyperlipidemia, unspecified: Secondary | ICD-10-CM | POA: Diagnosis not present

## 2015-06-05 DIAGNOSIS — E1121 Type 2 diabetes mellitus with diabetic nephropathy: Secondary | ICD-10-CM

## 2015-06-05 DIAGNOSIS — I10 Essential (primary) hypertension: Secondary | ICD-10-CM

## 2015-06-05 LAB — CBC WITH DIFFERENTIAL/PLATELET
Basophils Absolute: 0 10*3/uL (ref 0–0.1)
Basophils Relative: 0 %
Eosinophils Absolute: 0.1 10*3/uL (ref 0–0.7)
Eosinophils Relative: 1 %
HEMATOCRIT: 36.4 % (ref 35.0–47.0)
HEMOGLOBIN: 12 g/dL (ref 12.0–16.0)
LYMPHS ABS: 2.5 10*3/uL (ref 1.0–3.6)
LYMPHS PCT: 38 %
MCH: 25.7 pg — AB (ref 26.0–34.0)
MCHC: 32.9 g/dL (ref 32.0–36.0)
MCV: 78.1 fL — AB (ref 80.0–100.0)
MONO ABS: 0.3 10*3/uL (ref 0.2–0.9)
MONOS PCT: 5 %
NEUTROS ABS: 3.6 10*3/uL (ref 1.4–6.5)
NEUTROS PCT: 56 %
Platelets: 318 10*3/uL (ref 150–440)
RBC: 4.66 MIL/uL (ref 3.80–5.20)
RDW: 16 % — AB (ref 11.5–14.5)
WBC: 6.5 10*3/uL (ref 3.6–11.0)

## 2015-06-05 LAB — COMPREHENSIVE METABOLIC PANEL
ALT: 19 U/L (ref 14–54)
AST: 19 U/L (ref 15–41)
Albumin: 4.2 g/dL (ref 3.5–5.0)
Alkaline Phosphatase: 81 U/L (ref 38–126)
Anion gap: 9 (ref 5–15)
BILIRUBIN TOTAL: 0.7 mg/dL (ref 0.3–1.2)
BUN: 18 mg/dL (ref 6–20)
CALCIUM: 9.5 mg/dL (ref 8.9–10.3)
CHLORIDE: 105 mmol/L (ref 101–111)
CO2: 26 mmol/L (ref 22–32)
CREATININE: 0.88 mg/dL (ref 0.44–1.00)
Glucose, Bld: 106 mg/dL — ABNORMAL HIGH (ref 65–99)
Potassium: 3.6 mmol/L (ref 3.5–5.1)
Sodium: 140 mmol/L (ref 135–145)
TOTAL PROTEIN: 8.1 g/dL (ref 6.5–8.1)

## 2015-06-05 LAB — SEDIMENTATION RATE: SED RATE: 41 mm/h — AB (ref 0–30)

## 2015-06-05 LAB — LIPID PANEL
CHOLESTEROL: 141 mg/dL (ref 0–200)
HDL: 41 mg/dL (ref >40)
LDL Cholesterol: 64 mg/dL (ref 0–99)
TRIGLYCERIDES: 182 mg/dL — AB (ref <150)
Total CHOL/HDL Ratio: 3.4 RATIO
VLDL: 36 mg/dL (ref 0–40)

## 2015-06-05 LAB — RAPID HIV SCREEN (HIV 1/2 AB+AG)
HIV 1/2 Antibodies: NONREACTIVE
HIV-1 P24 Antigen - HIV24: NONREACTIVE

## 2015-06-05 LAB — TSH: TSH: 0.677 u[IU]/mL (ref 0.350–4.500)

## 2015-06-05 LAB — URIC ACID: Uric Acid, Serum: 6.2 mg/dL (ref 2.3–6.6)

## 2015-06-05 LAB — C-REACTIVE PROTEIN: CRP: 0.5 mg/dL (ref <1.0)

## 2015-06-05 MED ORDER — AMLODIPINE BESYLATE-VALSARTAN 5-320 MG PO TABS: 1.0000 | ORAL_TABLET | Freq: Every day | ORAL | Status: DC

## 2015-06-05 MED ORDER — METFORMIN HCL 1000 MG PO TABS: 1000.0000 mg | ORAL_TABLET | Freq: Two times a day (BID) | ORAL | Status: DC

## 2015-06-05 NOTE — Progress Notes (Signed)
Name: Cheryl Barr   MRN: [id number]    DOB: April 06, 1955   Date:06/05/2015       Progress Note  Subjective  Chief Complaint  Chief Complaint  Patient presents with  . Hypertension    patient is here for her 84-month f/u    HPI  HTN: she is taking medication as prescribed and is doing well now ,bp at home has been 130's. No headaches or chest pain  Weight loss: she has been eating as usual but has lost 9 lbs over the past 10 months without any changes in her diet or level of activity. No symptoms of anxiety, no palpitation or change in bowel movement, no fatigue. She is not up to date with mammogram.    Patient Active Problem List   Diagnosis Date Noted  . Hernia of abdominal cavity 09/15/2014  . Impaired renal function 08/10/2014  . Controlled type 2 diabetes with renal manifestation ([place]) 08/10/2014  . Dyslipidemia 08/10/2014  . Interval gout 08/10/2014  . Microalbuminuria 08/10/2014  . Morbid obesity ([place]) 08/10/2014  . Osteoarthritis of left knee 08/10/2014  . Vitamin D deficiency 08/10/2014  . Cough 01/21/2014  . Intraductal papilloma of breast 06/27/2013  . Hypertension, essential, benign 09/02/2009  . ABNORMAL EKG 09/02/2009    Past Surgical History  Procedure Laterality Date  . Colonoscopy  2009    1 polyp found. Dr. Roselyn Reef  . Breast surgery Left 2015  . Strabismus surgery      Repair as a child  . Knee surgery Left 01/14/2009  . Tubal ligation  1990  . Ventral hernia repair N/A 10/09/2014    Procedure: HERNIA REPAIR VENTRAL ADULT;  Surgeon: Robert Bellow, MD;  Location: [facility] ORS;  Service: General;  Laterality: N/A;  . Insertion of mesh N/A 10/09/2014    Procedure: INSERTION OF MESH;  Surgeon: Robert Bellow, MD;  Location: [facility] ORS;  Service: General;  Laterality: N/A;  . Hernia repair  [id number]    Ventral/umbilical hernia repair with 10 x 10 cm Atrium mesh in preperitoneal position.    Family History  Problem Relation Age of Onset  . Hypertension  Mother   . Diabetes Mother   . Heart attack Father   . Coronary artery disease Father   . Breast cancer Father 49  . Allergies Son     Barista  . Diabetes Sister   . Diabetes Brother   . Gout Brother     Social History   Social History  . Marital Status: Widowed    Spouse Name: N/A  . Number of Children: 3  . Years of Education: 12th Grade   Occupational History  .  [employer]   Social History Main Topics  . Smoking status: Former Smoker    Types: Cigarettes    Quit date: 10/02/1983  . Smokeless tobacco: Never Used  . Alcohol Use: No  . Drug Use: No  . Sexual Activity: Yes   Other Topics Concern  . Not on file   Social History Narrative   Widowed.    No regular exercise.     Current outpatient prescriptions:  .  allopurinol (ZYLOPRIM) 100 MG tablet, Take 1 tablet (100 mg total) by mouth daily., Disp: 90 tablet, Rfl: 1 .  amLODipine-valsartan (EXFORGE) 5-320 MG tablet, Take 1 tablet by mouth daily., Disp: 30 tablet, Rfl: 0 .  aspirin 81 MG EC tablet, Take 81 mg by mouth daily.  , Disp: , Rfl:  .  atorvastatin (  LIPITOR) 40 MG tablet, Take 1 tablet (40 mg total) by mouth daily., Disp: 90 tablet, Rfl: 1 .  carvedilol (COREG) 6.25 MG tablet, Take 1 tablet (6.25 mg total) by mouth 2 (two) times daily with a meal., Disp: 180 tablet, Rfl: 0 .  glucose blood (FREESTYLE LITE) test strip, FREESTYLE LITE TEST (In Vitro Strip)  check fsbs once a day for 0 days  Quantity: 100.00;  Refills: 5   Ordered :31-Dec-2009  Steele Sizer MD;  Dani Gobble Active Comments: DX: 250.00, Disp: , Rfl:  .  LANCETS ULTRA THIN 30G MISC, , Disp: , Rfl:  .  metFORMIN (GLUCOPHAGE) 1000 MG tablet, Take 1 tablet (1,000 mg total) by mouth 2 (two) times daily with a meal., Disp: 180 tablet, Rfl: 1 .  Multiple Vitamins-Minerals (CENTRUM SILVER ULTRA WOMENS) TABS, Take 1 tablet by mouth daily., Disp: , Rfl:   Current facility-administered medications:  .  dexamethasone (DECADRON)  injection 10 mg, 10 mg, Intravenous, Once, Steele Sizer, MD  No Known Allergies   ROS  Constitutional: Negative for fever , positive for weight change.  Respiratory: Negative for cough and shortness of breath.   Cardiovascular: Negative for chest pain or palpitations.  Gastrointestinal: Negative for abdominal pain, no bowel changes.  Musculoskeletal: Negative for gait problem or joint swelling.  Skin: Negative for rash.  Neurological: Negative for dizziness or headache.  No other specific complaints in a complete review of systems (except as listed in HPI above).  Objective  Filed Vitals:   06/05/15 1219  BP: 124/78  Pulse: 89  Temp: 98.5 F (36.9 C)  TempSrc: Oral  Resp: 16  Weight: 212 lb 11.2 oz (96.48 kg)  SpO2: 95%    Body mass index is 33.31 kg/(m^2).  Physical Exam   Constitutional: Patient appears well-developed and well-nourished. Obese  No distress.  HEENT: head atraumatic, normocephalic, pupils equal and reactive to light, , neck supple, throat within normal limits Cardiovascular: Normal rate, regular rhythm and normal heart sounds.  No murmur heard. No BLE edema. Pulmonary/Chest: Effort normal and breath sounds normal. No respiratory distress. Abdominal: Soft.  There is no tenderness. Psychiatric: Patient has a normal mood and affect. behavior is normal. Judgment and thought content normal.  Recent Results (from the past 2160 hour(s))  POCT glycosylated hemoglobin (Hb A1C)     Status: None   Collection Time: 05/07/15 10:06 AM  Result Value Ref Range   Hemoglobin A1C 6.5      PHQ2/9: Depression screen [facility] 2/9 06/05/2015 05/07/2015 02/12/2015 11/11/2014 08/11/2014  Decreased Interest 0 0 0 0 0  Down, Depressed, Hopeless 0 0 0 0 0  PHQ - 2 Score 0 0 0 0 0     Fall Risk: Fall Risk  06/05/2015 05/07/2015 02/12/2015 11/11/2014 08/11/2014  Falls in the past year? No No No No No      Functional Status Survey: Is the patient deaf or have difficulty  hearing?: No Does the patient have difficulty seeing, even when wearing glasses/contacts?: No Does the patient have difficulty concentrating, remembering, or making decisions?: No Does the patient have difficulty walking or climbing stairs?: No Does the patient have difficulty dressing or bathing?: No Does the patient have difficulty doing errands alone such as visiting a doctor's office or shopping?: No   Assessment & Plan  1. Hypertension, essential, benign  Doing well now, taking medications as prescribed Refill of Exforge sent to pharmacy  2. Controlled gout  - Uric acid  3. Weight loss, non-intentional  -  TSH - Comprehensive metabolic panel - CBC with Differential/Platelet - C-reactive protein - Sedimentation rate - HIV antibody  4. Dyslipidemia  - Lipid panel  5. Vitamin D deficiency  - VITAMIN D 25 Hydroxy (Vit-D Deficiency, Fractures)  6. Breast cancer screening  - MM Digital Screening; Future

## 2015-06-06 LAB — VITAMIN D 25 HYDROXY (VIT D DEFICIENCY, FRACTURES): VIT D 25 HYDROXY: 21.5 ng/mL — AB (ref 30.0–100.0)

## 2015-06-08 ENCOUNTER — Telehealth: Payer: Self-pay

## 2015-06-08 NOTE — Telephone Encounter (Signed)
Contacted this patient to inform her that she has been scheduled for a mammogram on Wednesday, Jun 17, 2015 at 9am.

## 2015-06-08 NOTE — Telephone Encounter (Signed)
-----   Message from Steele Sizer, MD sent at 06/07/2015  9:34 PM EDT ----- Vitamin D is low, I will send prescription vitamin D to  pharmacy and once finished she/he needs to take otc vitamin D 2000 units daily Normal C-reactive protein, TSH and uric acid level Glucose is at goal , normal kidney and liver function test Sed rate is elevated and she is losing weight- she needs to have mammogram as discussed during her visit Lipid panel is good, continue Atorvastatin Normal CBC

## 2015-06-08 NOTE — Telephone Encounter (Signed)
Left message for patient to return my call for labs

## 2015-06-17 ENCOUNTER — Ambulatory Visit
Admission: RE | Admit: 2015-06-17 | Discharge: 2015-06-17 | Disposition: A | Discharge status: Home / Self Care | Payer: Commercial Managed Care - PPO | Source: Ambulatory Visit | Attending: Family Medicine | Admitting: Family Medicine

## 2015-06-17 DIAGNOSIS — Z1231 Encounter for screening mammogram for malignant neoplasm of breast: Secondary | ICD-10-CM | POA: Insufficient documentation

## 2015-06-17 DIAGNOSIS — Z1239 Encounter for other screening for malignant neoplasm of breast: Secondary | ICD-10-CM

## 2015-08-05 ENCOUNTER — Ambulatory Visit: Payer: Commercial Managed Care - PPO | Admitting: Family Medicine

## 2015-09-07 ENCOUNTER — Encounter: Payer: Self-pay | Admitting: Family Medicine

## 2015-09-07 ENCOUNTER — Ambulatory Visit (INDEPENDENT_AMBULATORY_CARE_PROVIDER_SITE_OTHER): Payer: Commercial Managed Care - PPO | Admitting: Family Medicine

## 2015-09-07 VITALS — BP 148/96 | HR 78 | Temp 98.0°F | Resp 18 | Ht 67.0 in | Wt 215.4 lb

## 2015-09-07 DIAGNOSIS — I10 Essential (primary) hypertension: Secondary | ICD-10-CM

## 2015-09-07 DIAGNOSIS — E1121 Type 2 diabetes mellitus with diabetic nephropathy: Secondary | ICD-10-CM

## 2015-09-07 DIAGNOSIS — R700 Elevated erythrocyte sedimentation rate: Secondary | ICD-10-CM

## 2015-09-07 DIAGNOSIS — E785 Hyperlipidemia, unspecified: Secondary | ICD-10-CM | POA: Diagnosis not present

## 2015-09-07 DIAGNOSIS — E559 Vitamin D deficiency, unspecified: Secondary | ICD-10-CM | POA: Diagnosis not present

## 2015-09-07 DIAGNOSIS — M109 Gout, unspecified: Secondary | ICD-10-CM

## 2015-09-07 LAB — C-REACTIVE PROTEIN

## 2015-09-07 MED ORDER — CARVEDILOL 12.5 MG PO TABS
12.5000 mg | ORAL_TABLET | Freq: Two times a day (BID) | ORAL | 0 refills | Status: DC
Start: 2015-09-07 — End: 2016-01-08

## 2015-09-07 MED ORDER — ALLOPURINOL 100 MG PO TABS: 100.0000 mg | ORAL_TABLET | Freq: Every day | ORAL | 1 refills | Status: DC

## 2015-09-07 MED ORDER — VITAMIN D (ERGOCALCIFEROL) 1.25 MG (50000 UNIT) PO CAPS: 50000.0000 [IU] | ORAL_CAPSULE | ORAL | 0 refills | Status: DC

## 2015-09-07 MED ORDER — AMLODIPINE BESYLATE-VALSARTAN 5-320 MG PO TABS: 1.0000 | ORAL_TABLET | Freq: Every day | ORAL | 1 refills | Status: DC

## 2015-09-07 MED ORDER — CARVEDILOL 6.25 MG PO TABS: 6.2500 mg | ORAL_TABLET | Freq: Two times a day (BID) | ORAL | 1 refills | Status: DC

## 2015-09-07 MED ORDER — ATORVASTATIN CALCIUM 40 MG PO TABS: 40.0000 mg | ORAL_TABLET | Freq: Every day | ORAL | 1 refills | Status: DC

## 2015-09-07 NOTE — Progress Notes (Signed)
Name: Cheryl Barr   MRN: [id number]    DOB: September 05, 1955   Date:09/07/2015       Progress Note  Subjective  Chief Complaint  Chief Complaint  Patient presents with  . Hypertension    pt here for 2 month follow up  . Diabetes    HPI  HTN: she is complaint with her medication, denies side effects, no chest pain, palpitation or SOB. BP elevated today, she states she was at work and they had a party, she was eating pork before she came. Recheck was also elevated, we will adjust bp medication,  Off diuretic because of gout.  Hyperlipidemia: last LDL was at goal, continue high dose statin therapy, denies side effects of medication and is compliant with medications, LDL is at goal .   DMII with microalbuminuria: urine micro was 100 in Dec 2016 and also Jan 2016 ,  Taking ARB, and  aspirin, also  Metfomin. She denies side effects, we will recheck hgbA1C  . Eye exam is not up to date - but she will schedule an appointment . No polyphagia, polydipsia or polyuria, she has nocturia, but she states secondary to hot flashes and needs to drink water during the night to control her body temperature.   Gout: . She is back on Allopurinol now . Doing well at this time. No recent episodes  Obesity: she was losing without dietary changes, but has gained a few lbs since last visit. TSH was normal but sed rate elevated, we will recheck labs and if still high return for follow up. She denies joint aches, headaches, blood in stools, fever. Feeling well  Patient Active Problem List   Diagnosis Date Noted  . Hernia of abdominal cavity 09/15/2014  . Impaired renal function 08/10/2014  . Controlled type 2 diabetes with renal manifestation ([place]) 08/10/2014  . Dyslipidemia 08/10/2014  . Interval gout 08/10/2014  . Microalbuminuria 08/10/2014  . Morbid obesity ([place]) 08/10/2014  . Osteoarthritis of left knee 08/10/2014  . Vitamin D deficiency 08/10/2014  . Cough 01/21/2014  . Intraductal papilloma of breast  06/27/2013  . Hypertension, essential, benign 09/02/2009  . ABNORMAL EKG 09/02/2009    Past Surgical History:  Procedure Laterality Date  . BREAST BIOPSY Left 05/24/2013   neg  . BREAST EXCISIONAL BIOPSY Left 06/21/2013   neg/ dr. Bary Castilla  . BREAST SURGERY Left 2015  . COLONOSCOPY  2009   1 polyp found. Dr. Roselyn Reef  . HERNIA REPAIR  [id number]   Ventral/umbilical hernia repair with 10 x 10 cm Atrium mesh in preperitoneal position.  . INSERTION OF MESH N/A 10/09/2014   Procedure: INSERTION OF MESH;  Surgeon: Robert Bellow, MD;  Location: [facility] ORS;  Service: General;  Laterality: N/A;  . KNEE SURGERY Left 01/14/2009  . STRABISMUS SURGERY     Repair as a child  . TUBAL LIGATION  1990  . VENTRAL HERNIA REPAIR N/A 10/09/2014   Procedure: HERNIA REPAIR VENTRAL ADULT;  Surgeon: Robert Bellow, MD;  Location: [facility] ORS;  Service: General;  Laterality: N/A;    Family History  Problem Relation Age of Onset  . Hypertension Mother   . Diabetes Mother   . Heart attack Father   . Coronary artery disease Father   . Breast cancer Father 30  . Allergies Son     Barista  . Diabetes Sister   . Diabetes Brother   . Gout Brother     Social History   Social History  .  Marital status: Widowed    Spouse name: N/A  . Number of children: 3  . Years of education: 12th Grade   Occupational History  .  [employer]   Social History Main Topics  . Smoking status: Former Smoker    Types: Cigarettes    Quit date: 10/02/1983  . Smokeless tobacco: Never Used  . Alcohol use No  . Drug use: No  . Sexual activity: Yes   Other Topics Concern  . Not on file   Social History Narrative   Widowed.    No regular exercise.     Current Outpatient Prescriptions:  .  allopurinol (ZYLOPRIM) 100 MG tablet, Take 1 tablet (100 mg total) by mouth daily., Disp: 90 tablet, Rfl: 1 .  amLODipine-valsartan (EXFORGE) 5-320 MG tablet, Take 1 tablet by mouth daily., Disp: 90 tablet, Rfl: 1 .   aspirin 81 MG EC tablet, Take 81 mg by mouth daily.  , Disp: , Rfl:  .  atorvastatin (LIPITOR) 40 MG tablet, Take 1 tablet (40 mg total) by mouth daily., Disp: 90 tablet, Rfl: 1 .  carvedilol (COREG) 6.25 MG tablet, Take 1 tablet (6.25 mg total) by mouth 2 (two) times daily with a meal., Disp: 180 tablet, Rfl: 1 .  glucose blood (FREESTYLE LITE) test strip, FREESTYLE LITE TEST (In Vitro Strip)  check fsbs once a day for 0 days  Quantity: 100.00;  Refills: 5   Ordered :31-Dec-2009  Steele Sizer MD;  Dani Gobble Active Comments: DX: 250.00, Disp: , Rfl:  .  LANCETS ULTRA THIN 30G MISC, , Disp: , Rfl:  .  metFORMIN (GLUCOPHAGE) 1000 MG tablet, Take 1 tablet (1,000 mg total) by mouth 2 (two) times daily with a meal., Disp: 180 tablet, Rfl: 1 .  Multiple Vitamins-Minerals (CENTRUM SILVER ULTRA WOMENS) TABS, Take 1 tablet by mouth daily., Disp: , Rfl:   No Known Allergies   ROS  Constitutional: Negative for fever or weight change.  Respiratory: Negative for cough and shortness of breath.   Cardiovascular: Negative for chest pain or palpitations.  Gastrointestinal: Negative for abdominal pain, no bowel changes.  Musculoskeletal: Negative for gait problem or joint swelling.  Skin: Negative for rash.  Neurological: Negative for dizziness or headache.  No other specific complaints in a complete review of systems (except as listed in HPI above).  Objective   Vitals:   09/07/15 1631  BP: (!) 142/88  Pulse: 78  Resp: 18  Temp: 98 F (36.7 C)  SpO2: 96%  Weight: 215 lb 7 oz (97.7 kg)  Height: 5\' 7"  (1.702 m)    Body mass index is 33.74 kg/m.  Physical Exam  Constitutional: Patient appears well-developed and well-nourished. Obese  No distress.  HEENT: head atraumatic, normocephalic, pupils equal and reactive to light, , neck supple, throat within normal limits Cardiovascular: Normal rate, regular rhythm and normal heart sounds.  No murmur heard. No BLE edema. Pulmonary/Chest:  Effort normal and breath sounds normal. No respiratory distress. Abdominal: Soft.  There is no tenderness. Psychiatric: Patient has a normal mood and affect. behavior is normal. Judgment and thought content normal.  Diabetic Foot Exam: Diabetic Foot Exam - Simple   Simple Foot Form Diabetic Foot exam was performed with the following findings:  Yes 09/07/2015  4:49 PM  Visual Inspection See comments:  Yes Sensation Testing Intact to touch and monofilament testing bilaterally:  Yes Pulse Check Posterior Tibialis and Dorsalis pulse intact bilaterally:  Yes Comments Thick toe nails  PHQ2/9: Depression screen [facility] 2/9 09/07/2015 06/05/2015 05/07/2015 02/12/2015 11/11/2014  Decreased Interest 0 0 0 0 0  Down, Depressed, Hopeless 0 0 0 0 0  PHQ - 2 Score 0 0 0 0 0     Fall Risk: Fall Risk  09/07/2015 06/05/2015 05/07/2015 02/12/2015 11/11/2014  Falls in the past year? No No No No No     Assessment & Plan  1. Hypertension, essential, benign  bp is not at goal, we will increase dose of Coreg and monitor - carvedilol (COREG) 12.5 MG tablet; Take 1 tablet (12.5mg  total) by mouth 2 (two) times daily with a meal.  Dispense: 180 tablet; Refill: 1 - amLODipine-valsartan (EXFORGE) 5-320 MG tablet; Take 1 tablet by mouth daily.  Dispense: 90 tablet; Refill: 1  2. Controlled type 2 diabetes mellitus with diabetic nephropathy, without long-term current use of insulin (HCC)  - Hemoglobin A1c  3. Morbid obesity, unspecified obesity type [facility])  Discussed with the patient the risk posed by an increased BMI. Discussed importance of portion control, calorie counting and at least 150 minutes of physical activity weekly. Avoid sweet beverages and drink more water. Eat at least 6 servings of fruit and vegetables daily   4. Vitamin D deficiency  I will send rx vitamin D now  5. Elevated sed rate  Weight has increased again  - Sedimentation rate - C-reactive protein  6. Dyslipidemia  -  atorvastatin (LIPITOR) 40 MG tablet; Take 1 tablet (40 mg total) by mouth daily.  Dispense: 90 tablet; Refill: 1  7. Controlled gout  - allopurinol (ZYLOPRIM) 100 MG tablet; Take 1 tablet (100 mg total) by mouth daily.  Dispense: 90 tablet; Refill: 1

## 2015-09-08 LAB — HEMOGLOBIN A1C
HEMOGLOBIN A1C: 6.5 % — AB (ref <5.7)
Mean Plasma Glucose: 140 mg/dL

## 2015-09-08 LAB — SEDIMENTATION RATE: SED RATE: 18 mm/h (ref 0–30)

## 2015-10-22 ENCOUNTER — Encounter: Payer: Self-pay | Admitting: Family Medicine

## 2015-10-22 ENCOUNTER — Other Ambulatory Visit: Payer: Self-pay | Admitting: Family Medicine

## 2015-10-22 ENCOUNTER — Ambulatory Visit (INDEPENDENT_AMBULATORY_CARE_PROVIDER_SITE_OTHER): Payer: Commercial Managed Care - PPO | Admitting: Family Medicine

## 2015-10-22 VITALS — BP 128/74 | HR 84 | Temp 98.3°F | Resp 16 | Ht 67.0 in | Wt 213.7 lb

## 2015-10-22 DIAGNOSIS — Z01419 Encounter for gynecological examination (general) (routine) without abnormal findings: Secondary | ICD-10-CM

## 2015-10-22 DIAGNOSIS — R8290 Unspecified abnormal findings in urine: Secondary | ICD-10-CM | POA: Diagnosis not present

## 2015-10-22 DIAGNOSIS — Z0000 Encounter for general adult medical examination without abnormal findings: Secondary | ICD-10-CM

## 2015-10-22 DIAGNOSIS — Z23 Encounter for immunization: Secondary | ICD-10-CM | POA: Diagnosis not present

## 2015-10-22 NOTE — Progress Notes (Signed)
Name: Cheryl Barr   MRN: [id number]    DOB: 1955-10-02   Date:10/22/2015       Progress Note  Subjective  Chief Complaint  Chief Complaint  Patient presents with  . Annual Exam    HPI  Well Woman: she has no complaints. Sexually active, same partner many years, no vaginal discharge, no bladder problems. Mammogram is up to date, colonoscopy is up to date, due for eye exam.She will get flu shot at work, but willing to have Zostavax in our office today  Urine odor: no dysuria or hematuria, also seems concentrated  Patient Active Problem List   Diagnosis Date Noted  . Hernia of abdominal cavity 09/15/2014  . Impaired renal function 08/10/2014  . Controlled type 2 diabetes with renal manifestation ([place]) 08/10/2014  . Dyslipidemia 08/10/2014  . Interval gout 08/10/2014  . Microalbuminuria 08/10/2014  . Morbid obesity ([place]) 08/10/2014  . Osteoarthritis of left knee 08/10/2014  . Vitamin D deficiency 08/10/2014  . Cough 01/21/2014  . Intraductal papilloma of breast 06/27/2013  . Hypertension, essential, benign 09/02/2009  . ABNORMAL EKG 09/02/2009    Past Surgical History:  Procedure Laterality Date  . BREAST BIOPSY Left 05/24/2013   neg  . BREAST EXCISIONAL BIOPSY Left 06/21/2013   neg/ dr. Bary Castilla  . BREAST SURGERY Left 2015  . COLONOSCOPY  2009   1 polyp found. Dr. Roselyn Reef  . HERNIA REPAIR  [id number]/[id number]   Ventral/umbilical hernia repair with 10 x 10 cm Atrium mesh in preperitoneal position.  . INSERTION OF MESH N/A 10/09/2014   Procedure: INSERTION OF MESH;  Surgeon: Robert Bellow, MD;  Location: [facility] ORS;  Service: General;  Laterality: N/A;  . KNEE SURGERY Left 01/14/2009  . STRABISMUS SURGERY     Repair as a child  . TUBAL LIGATION  1990  . VENTRAL HERNIA REPAIR N/A 10/09/2014   Procedure: HERNIA REPAIR VENTRAL ADULT;  Surgeon: Robert Bellow, MD;  Location: [facility] ORS;  Service: General;  Laterality: N/A;    Family History  Problem Relation Age of Onset  .  Hypertension Mother   . Diabetes Mother   . Heart attack Father   . Coronary artery disease Father   . Breast cancer Father 52  . Allergies Son     Barista  . Diabetes Sister   . Diabetes Brother   . Gout Brother     Social History   Social History  . Marital status: Widowed    Spouse name: N/A  . Number of children: 3  . Years of education: 12th Grade   Occupational History  .  [employer]   Social History Main Topics  . Smoking status: Former Smoker    Types: Cigarettes    Quit date: 10/02/1983  . Smokeless tobacco: Never Used  . Alcohol use No  . Drug use: No  . Sexual activity: Yes    Partners: Male   Other Topics Concern  . Not on file   Social History Narrative   Widowed.    No regular exercise.     Current Outpatient Prescriptions:  .  allopurinol (ZYLOPRIM) 100 MG tablet, Take 1 tablet (100 mg total) by mouth daily., Disp: 90 tablet, Rfl: 1 .  amLODipine-valsartan (EXFORGE) 5-320 MG tablet, Take 1 tablet by mouth daily., Disp: 90 tablet, Rfl: 1 .  aspirin 81 MG EC tablet, Take 81 mg by mouth daily.  , Disp: , Rfl:  .  atorvastatin (LIPITOR) 40 MG tablet, Take 1  tablet (40 mg total) by mouth daily., Disp: 90 tablet, Rfl: 1 .  carvedilol (COREG) 12.5 MG tablet, Take 1 tablet (12.5 mg total) by mouth 2 (two) times daily with a meal., Disp: 180 tablet, Rfl: 0 .  glucose blood (FREESTYLE LITE) test strip, FREESTYLE LITE TEST (In Vitro Strip)  check fsbs once a day for 0 days  Quantity: 100.00;  Refills: 5   Ordered :31-Dec-2009  Steele Sizer MD;  Dani Gobble Active Comments: DX: 250.00, Disp: , Rfl:  .  LANCETS ULTRA THIN 30G MISC, , Disp: , Rfl:  .  metFORMIN (GLUCOPHAGE) 1000 MG tablet, Take 1 tablet (1,000 mg total) by mouth 2 (two) times daily with a meal., Disp: 180 tablet, Rfl: 1 .  Multiple Vitamins-Minerals (CENTRUM SILVER ULTRA WOMENS) TABS, Take 1 tablet by mouth daily., Disp: , Rfl:  .  Vitamin D, Ergocalciferol, (DRISDOL) 50000  units CAPS capsule, Take 1 capsule (50,000 Units total) by mouth every 7 (seven) days., Disp: 12 capsule, Rfl: 0  No Known Allergies   ROS  Constitutional: Negative for fever or weight change.  Respiratory: Negative for cough and shortness of breath.   Cardiovascular: Negative for chest pain or palpitations.  Gastrointestinal: Negative for abdominal pain, no bowel changes.  Musculoskeletal: Negative for gait problem or joint swelling.  Skin: Negative for rash.  Neurological: Negative for dizziness or headache.  No other specific complaints in a complete review of systems (except as listed in HPI above).  Objective  Vitals:   10/22/15 1024  BP: 128/74  Pulse: 84  Resp: 16  Temp: 98.3 F (36.8 C)  TempSrc: Oral  SpO2: 96%  Weight: 213 lb 11.2 oz (96.9 kg)  Height: 5\' 7"  (1.702 m)    Body mass index is 33.47 kg/m.  Physical Exam  Constitutional: Patient appears well-developed and well-nourished. No distress.  HENT: Head: Normocephalic and atraumatic. Ears: B TMs ok, no erythema or effusion; Nose: Nose normal. Mouth/Throat: Oropharynx is clear and moist. No oropharyngeal exudate.  Eyes: Conjunctivae and EOM are normal. Pupils are equal, round, and reactive to light. No scleral icterus.  Neck: Normal range of motion. Neck supple. No JVD present. No thyromegaly present.  Cardiovascular: Normal rate, regular rhythm and normal heart sounds.  No murmur heard. No BLE edema. Pulmonary/Chest: Effort normal and breath sounds normal. No respiratory distress. Abdominal: Soft. Bowel sounds are normal, no distension. There is no tenderness. no masses Breast: no lumps or masses, no nipple discharge or rashes FEMALE GENITALIA:  External genitalia normal External urethra normal Vaginal vault normal without discharge or lesions Mild cystocele RECTAL: normal external exam except for small skin tag Musculoskeletal: Normal range of motion, no joint effusions. No gross  deformities Neurological: he is alert and oriented to person, place, and time. No cranial nerve deficit. Coordination, balance, strength, speech and gait are normal.  Skin: Skin is warm and dry. No rash noted. No erythema. Multiple skin tags on face Psychiatric: Patient has a normal mood and affect. behavior is normal. Judgment and thought content normal.  Recent Results (from the past 2160 hour(s))  Hemoglobin A1c     Status: Abnormal   Collection Time: 09/07/15  4:56 PM  Result Value Ref Range   Hgb A1c MFr Bld 6.5 (H) <5.7 %    Comment:   For someone without known diabetes, a hemoglobin A1c value of 6.5% or greater indicates that they may have diabetes and this should be confirmed with a follow-up test.   For someone with  known diabetes, a value <7% indicates that their diabetes is well controlled and a value greater than or equal to 7% indicates suboptimal control. A1c targets should be individualized based on duration of diabetes, age, comorbid conditions, and other considerations.   Currently, no consensus exists for use of hemoglobin A1c for diagnosis of diabetes for children.      Mean Plasma Glucose 140 mg/dL  Sedimentation rate     Status: None   Collection Time: 09/07/15  4:56 PM  Result Value Ref Range   Sed Rate 18 0 - 30 mm/hr  C-reactive protein     Status: None   Collection Time: 09/07/15  4:56 PM  Result Value Ref Range   CRP <0.5 <0.60 mg/dL      PHQ2/9: Depression screen [facility] 2/9 09/07/2015 06/05/2015 05/07/2015 02/12/2015 11/11/2014  Decreased Interest 0 0 0 0 0  Down, Depressed, Hopeless 0 0 0 0 0  PHQ - 2 Score 0 0 0 0 0     Fall Risk: Fall Risk  09/07/2015 06/05/2015 05/07/2015 02/12/2015 11/11/2014  Falls in the past year? No No No No No      Functional Status Survey: Is the patient deaf or have difficulty hearing?: No Does the patient have difficulty seeing, even when wearing glasses/contacts?: No Does the patient have difficulty concentrating,  remembering, or making decisions?: No Does the patient have difficulty walking or climbing stairs?: No Does the patient have difficulty dressing or bathing?: No Does the patient have difficulty doing errands alone such as visiting a doctor's office or shopping?: No    Assessment & Plan  1. Well woman exam  Discussed importance of 150 minutes of physical activity weekly, eat two servings of fish weekly, eat one serving of tree nuts ( cashews, pistachios, pecans, almonds.Marland Kitchen) every other day, eat 6 servings of fruit/vegetables daily and drink plenty of water and avoid sweet beverages.   2. Need for shingles vaccine  - Varicella-zoster vaccine subcutaneous

## 2015-10-25 ENCOUNTER — Other Ambulatory Visit: Payer: Self-pay | Admitting: Family Medicine

## 2015-10-25 LAB — URINE CULTURE

## 2015-10-25 LAB — PLEASE NOTE

## 2015-10-25 MED ORDER — NITROFURANTOIN MONOHYD MACRO 100 MG PO CAPS: 100.0000 mg | ORAL_CAPSULE | Freq: Two times a day (BID) | ORAL | 0 refills | Status: DC

## 2015-10-29 ENCOUNTER — Other Ambulatory Visit: Payer: Self-pay | Admitting: Family Medicine

## 2015-10-29 LAB — URINE CULTURE

## 2015-10-29 LAB — SPECIMEN STATUS REPORT

## 2015-11-04 ENCOUNTER — Encounter: Payer: Commercial Managed Care - PPO | Admitting: Family Medicine

## 2016-01-08 ENCOUNTER — Other Ambulatory Visit: Payer: Self-pay | Admitting: Family Medicine

## 2016-01-08 DIAGNOSIS — I10 Essential (primary) hypertension: Secondary | ICD-10-CM

## 2016-01-11 ENCOUNTER — Ambulatory Visit (INDEPENDENT_AMBULATORY_CARE_PROVIDER_SITE_OTHER): Payer: Commercial Managed Care - PPO | Admitting: Family Medicine

## 2016-01-11 ENCOUNTER — Encounter: Payer: Self-pay | Admitting: Family Medicine

## 2016-01-11 VITALS — BP 152/102 | HR 85 | Temp 97.9°F | Resp 16 | Ht 67.0 in | Wt 205.2 lb

## 2016-01-11 DIAGNOSIS — M109 Gout, unspecified: Secondary | ICD-10-CM

## 2016-01-11 DIAGNOSIS — I10 Essential (primary) hypertension: Secondary | ICD-10-CM

## 2016-01-11 DIAGNOSIS — E559 Vitamin D deficiency, unspecified: Secondary | ICD-10-CM

## 2016-01-11 DIAGNOSIS — E538 Deficiency of other specified B group vitamins: Secondary | ICD-10-CM | POA: Diagnosis not present

## 2016-01-11 DIAGNOSIS — R700 Elevated erythrocyte sedimentation rate: Secondary | ICD-10-CM | POA: Diagnosis not present

## 2016-01-11 DIAGNOSIS — R05 Cough: Secondary | ICD-10-CM

## 2016-01-11 DIAGNOSIS — R809 Proteinuria, unspecified: Secondary | ICD-10-CM

## 2016-01-11 DIAGNOSIS — E1129 Type 2 diabetes mellitus with other diabetic kidney complication: Secondary | ICD-10-CM | POA: Diagnosis not present

## 2016-01-11 DIAGNOSIS — R634 Abnormal weight loss: Secondary | ICD-10-CM | POA: Diagnosis not present

## 2016-01-11 DIAGNOSIS — E785 Hyperlipidemia, unspecified: Secondary | ICD-10-CM | POA: Diagnosis not present

## 2016-01-11 DIAGNOSIS — R059 Cough, unspecified: Secondary | ICD-10-CM

## 2016-01-11 DIAGNOSIS — G5711 Meralgia paresthetica, right lower limb: Secondary | ICD-10-CM | POA: Diagnosis not present

## 2016-01-11 LAB — POCT GLYCOSYLATED HEMOGLOBIN (HGB A1C): HEMOGLOBIN A1C: 6.3

## 2016-01-11 LAB — POCT UA - MICROALBUMIN: MICROALBUMIN (UR) POC: 100 mg/L

## 2016-01-11 MED ORDER — BENZONATATE 100 MG PO CAPS: 100.0000 mg | ORAL_CAPSULE | Freq: Two times a day (BID) | ORAL | 0 refills | Status: DC | PRN

## 2016-01-11 MED ORDER — BECLOMETHASONE DIPROPIONATE 40 MCG/ACT IN AERS: 2.0000 | INHALATION_SPRAY | Freq: Two times a day (BID) | RESPIRATORY_TRACT | 5 refills | Status: DC

## 2016-01-11 MED ORDER — METFORMIN HCL 1000 MG PO TABS: 1000.0000 mg | ORAL_TABLET | Freq: Two times a day (BID) | ORAL | 1 refills | Status: DC

## 2016-01-11 NOTE — Progress Notes (Signed)
Name: Cheryl Barr   MRN: [id number]    DOB: [birth year]   Date:01/11/2016       Progress Note  Subjective  Chief Complaint  Chief Complaint  Patient presents with  . Medication Refill    4 month F/U  . Hypertension    Patient has been out of BP medication for the past week, needs refill on medication. Patient states Vladimir Faster called and will not have her medication until Tuesday. Having a deep Cough since being out of medication  . Diabetes    Patient has been checking sugar once a week and Low-118 Average-120 High-131  . Hyperlipidemia    Having leg burning pain on right thigh area.   . Gout    Well controlled with Allopurinol  . Obesity    Patient has lost 9 pounds since last visit without trying, patient is concerned about her weight loss.    HPI  HTN: usually compliant with medication, but ran out of Exforge last week and it was out of stock. She denies side effects, no chest pain, palpitation or SOB. BP elevated today, but out of Exforge, explained importance of compliance. Off diuretic because of gout.  Hyperlipidemia: last LDL was at goal, continue high dose statin therapy, denies side effects of medication and is compliant with medications, LDL is at goal .    DMII with microalbuminuria: urine micro was 100 in Dec 2016 and Nov 2017 Taking ARB, and aspirin, also  Metfomin, recently noticed tingling on right outer thigh, no weakness.   Eye exam is not up to date - but she will schedule an appointment . No polyphagia, polydipsia or polyuria, she has nocturia, but she states secondary to hot flashes and needs to drink water during the night to control her body temperature. No longer has dysuria or hesitancy, treated for UTI since last visit   Gout: . She is back on Allopurinol now . Doing well at this time. No recent episodes  Obesity: she was losing without dietary changes,but now she is concerned about lack of appetite and continues to lose weight. She is down 13 lbs since  last visit.   Cough: seen by Dr. 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 She states symptoms are worse again over the past few weeks because she had an URI. No wheezing, cough is dry, and wakes her up at night.   Patient Active Problem List   Diagnosis Date Noted  . B12 deficiency 01/11/2016  . Hernia of abdominal cavity 09/15/2014  . Impaired renal function 08/10/2014  . Controlled type 2 diabetes with renal manifestation ([place]) 08/10/2014  . Dyslipidemia 08/10/2014  . Interval gout 08/10/2014  . Microalbuminuria 08/10/2014  . Morbid obesity ([place]) 08/10/2014  . Osteoarthritis of left knee 08/10/2014  . Vitamin D deficiency 08/10/2014  . Cough 01/21/2014  . Intraductal papilloma of breast 06/27/2013  . Hypertension, essential, benign 09/02/2009  . ABNORMAL EKG 09/02/2009    Past Surgical History:  Procedure Laterality Date  . BREAST BIOPSY Left 05/24/2013   neg  . BREAST EXCISIONAL BIOPSY Left 06/21/2013   neg/ dr. Bary Castilla  . BREAST SURGERY Left 2015  . COLONOSCOPY  2009   1 polyp found. Dr. Roselyn Reef  . HERNIA REPAIR  40/98/1191   Ventral/umbilical hernia repair with 10 x 10 cm Atrium mesh in preperitoneal position.  . INSERTION OF MESH N/A 10/09/2014   Procedure: INSERTION OF MESH;  Surgeon: Robert Bellow, MD;  Location: [facility] ORS;  Service: General;  Laterality: N/A;  . KNEE SURGERY Left 01/14/2009  . STRABISMUS SURGERY     Repair as a child  . TUBAL LIGATION  1990  . VENTRAL HERNIA REPAIR N/A 10/09/2014   Procedure: HERNIA REPAIR VENTRAL ADULT;  Surgeon: Robert Bellow, MD;  Location: [facility] ORS;  Service: General;  Laterality: N/A;    Family History  Problem Relation Age of Onset  . Hypertension Mother   . Diabetes Mother   . Heart attack Father   . Coronary artery disease Father   . Breast cancer Father 16  . Allergies Son     Barista  . Diabetes Sister   . Diabetes Brother   . Gout Brother      Social History   Social History  . Marital status: Widowed    Spouse name: N/A  . Number of children: 3  . Years of education: 12th Grade   Occupational History  .  [employer]   Social History Main Topics  . Smoking status: Former Smoker    Types: Cigarettes    Quit date: 10/02/1983  . Smokeless tobacco: Never Used  . Alcohol use No  . Drug use: No  . Sexual activity: Yes    Partners: Male   Other Topics Concern  . Not on file   Social History Narrative   Widowed.    No regular exercise.     Current Outpatient Prescriptions:  .  allopurinol (ZYLOPRIM) 100 MG tablet, Take 1 tablet (100 mg total) by mouth daily., Disp: 90 tablet, Rfl: 1 .  amLODipine-valsartan (EXFORGE) 5-320 MG tablet, Take 1 tablet by mouth daily., Disp: 90 tablet, Rfl: 1 .  aspirin 81 MG EC tablet, Take 81 mg by mouth daily.  , Disp: , Rfl:  .  atorvastatin (LIPITOR) 40 MG tablet, Take 1 tablet (40 mg total) by mouth daily., Disp: 90 tablet, Rfl: 1 .  carvedilol (COREG) 12.5 MG tablet, TAKE ONE TABLET BY MOUTH TWICE DAILY WITH MEALS, Disp: 180 tablet, Rfl: 0 .  Cyanocobalamin 500 MCG SUBL, Place 1 tablet under the tongue daily., Disp: , Rfl:  .  glucose blood (FREESTYLE LITE) test strip, FREESTYLE LITE TEST (In Vitro Strip)  check fsbs once a day for 0 days  Quantity: 100.00;  Refills: 5   Ordered :31-Dec-2009  Steele Sizer MD;  Dani Gobble Active Comments: DX: 250.00, Disp: , Rfl:  .  LANCETS ULTRA THIN 30G MISC, , Disp: , Rfl:  .  metFORMIN (GLUCOPHAGE) 1000 MG tablet, Take 1 tablet (1,000 mg total) by mouth 2 (two) times daily with a meal., Disp: 180 tablet, Rfl: 1  No Known Allergies   ROS  Constitutional: Negative for fever, positive for weight change.  Respiratory: Positive for cough but no  shortness of breath.   Cardiovascular: Negative for chest pain or palpitations.  Gastrointestinal: Negative for abdominal pain, no bowel changes.  Musculoskeletal: Negative for gait problem  or joint swelling.  Skin: Negative for rash. Wart on hands - she will try otc medication first Neurological: Negative for dizziness or headache.  No other specific complaints in a complete review of systems (except as listed in HPI above).  Objective  Vitals:   01/11/16 1018  BP: (!) 152/102  Pulse: 85  Resp: 16  Temp: 97.9 F (36.6 C)  TempSrc: Oral  SpO2: 95%  Weight: 205 lb 3.2 oz (93.1 kg)  Height: 5\' 7"  (1.702 m)    Body mass  index is 32.14 kg/m.  Physical Exam  Constitutional: Patient appears well-developed and well-nourished. Obese No distress.  HEENT: head atraumatic, normocephalic, pupils equal and reactive to light,  neck supple, throat within normal limits Cardiovascular: Normal rate, regular rhythm and normal heart sounds.  No murmur heard. No BLE edema. Pulmonary/Chest: Effort normal and breath sounds normal. No respiratory distress. Abdominal: Soft.  There is no tenderness. Psychiatric: Patient has a normal mood and affect. behavior is normal. Judgment and thought content normal.  Recent Results (from the past 2160 hour(s))  Please Note     Status: None   Collection Time: 10/22/15 12:00 AM  Result Value Ref Range   Please note Comment     Comment: The date and/or time of collection was not indicated on the requisition as required by state and federal law.  The date of receipt of the specimen was used as the collection date if not supplied.   Urine culture     Status: Abnormal   Collection Time: 10/22/15 12:00 AM  Result Value Ref Range   Urine Culture, Routine Final report (A)    Urine Culture result 1 Escherichia coli (A)     Comment: Greater than 100,000 colony forming units per mL Cefazolin <=4 ug/mL Cefazolin with an MIC <=16 predicts susceptibility to the oral agents cefaclor, cefdinir, cefpodoxime, cefprozil, cefuroxime, cephalexin, and loracarbef when used for therapy of uncomplicated urinary tract infections due to E. coli, Klebsiella  pneumoniae, and Proteus mirabilis.    ANTIMICROBIAL SUSCEPTIBILITY Comment     Comment:       ** S = Susceptible; I = Intermediate; R = Resistant **                    P = Positive; N = Negative             MICS are expressed in micrograms per mL    Antibiotic                 RSLT#1    RSLT#2    RSLT#3    RSLT#4 Amoxicillin/Clavulanic Acid    S Ampicillin                     R Cefepime                       S Ceftriaxone                    S Cefuroxime                     I Cephalothin                    I Ciprofloxacin                  R Ertapenem                      S Gentamicin                     S Imipenem                       S Levofloxacin                   R Nitrofurantoin                 S  Piperacillin                   R Tetracycline                   R Tobramycin                     S Trimethoprim/Sulfa             R   Urine culture     Status: Abnormal   Collection Time: 10/22/15 12:00 AM  Result Value Ref Range   Urine Culture, Routine Final report (A)    Urine Culture result 1 Escherichia coli (A)     Comment: Greater than 100,000 colony forming units per mL Cefazolin <=4 ug/mL Cefazolin with an MIC <=16 predicts susceptibility to the oral agents cefaclor, cefdinir, cefpodoxime, cefprozil, cefuroxime, cephalexin, and loracarbef when used for therapy of uncomplicated urinary tract infections due to E. coli, Klebsiella pneumoniae, and Proteus mirabilis.    ANTIMICROBIAL SUSCEPTIBILITY Comment     Comment:       ** S = Susceptible; I = Intermediate; R = Resistant **                    P = Positive; N = Negative             MICS are expressed in micrograms per mL    Antibiotic                 RSLT#1    RSLT#2    RSLT#3    RSLT#4 Amoxicillin/Clavulanic Acid    S Ampicillin                     R Cefepime                       S Ceftriaxone                    S Cefuroxime                     S Cephalothin                    I Ciprofloxacin                   R Ertapenem                      S Gentamicin                     S Imipenem                       S Levofloxacin                   R Nitrofurantoin                 S Piperacillin                   R Tetracycline                   R Tobramycin                     S Trimethoprim/Sulfa             R   Specimen status report  Status: None   Collection Time: 10/22/15 12:00 AM  Result Value Ref Range   specimen status report Comment     Comment: Written Authorization Written Authorization Written Authorization Received. Authorization received from [facility] 10-26-2015 Logged by Milana Obey   POCT UA - Microalbumin     Status: Abnormal   Collection Time: 01/11/16 10:32 AM  Result Value Ref Range   Microalbumin Ur, POC 100 mg/L   Creatinine, POC  mg/dL   Albumin/Creatinine Ratio, Urine, POC    POCT HgB A1C     Status: None   Collection Time: 01/11/16 10:32 AM  Result Value Ref Range   Hemoglobin A1C 6.3      PHQ2/9: Depression screen [facility] 2/9 01/11/2016 09/07/2015 06/05/2015 05/07/2015 02/12/2015  Decreased Interest 0 0 0 0 0  Down, Depressed, Hopeless 0 0 0 0 0  PHQ - 2 Score 0 0 0 0 0    Fall Risk: Fall Risk  01/11/2016 09/07/2015 06/05/2015 05/07/2015 02/12/2015  Falls in the past year? No No No No No      Functional Status Survey: Is the patient deaf or have difficulty hearing?: No Does the patient have difficulty seeing, even when wearing glasses/contacts?: No Does the patient have difficulty concentrating, remembering, or making decisions?: No Does the patient have difficulty walking or climbing stairs?: No Does the patient have difficulty dressing or bathing?: No Does the patient have difficulty doing errands alone such as visiting a doctor's office or shopping?: No    Assessment & Plan  1. Controlled type 2 diabetes mellitus with microalbuminuria, without long-term current use of insulin (HCC)  - POCT UA - Microalbumin - 100, resume Exforge (ARB )  she also needs eye exam - POCT HgB A1C - 6.3% - metFORMIN (GLUCOPHAGE) 1000 MG tablet; Take 1 tablet (1,000 mg total) by mouth 2 (two) times daily with a meal.  Dispense: 180 tablet; Refill: 1  2. Morbid obesity, unspecified obesity type [facility])  Discussed with the patient the risk posed by an increased BMI. Discussed importance of portion control, calorie counting and at least 150 minutes of physical activity weekly. Avoid sweet beverages and drink more water. Eat at least 6 servings of fruit and vegetables daily   3. Hypertension, essential, benign  Out of medication and bp is not at goal, she states she will pick it up today  4. Dyslipidemia  - Lipid panel  5. Weight loss, non-intentional  She has lack of appetite, and previous elevated sed rate. Lost 13 lbs since last year. She states her normal weight used to be 225 lbs and she is very concerned about weight loss, colonoscopy was done in 2009, mammogram is up to date, we will check labs and hemoccult stools. Evaluate cough with CXR - CBC with Differential/Platelet - COMPLETE METABOLIC PANEL WITH GFR - Sedimentation rate - TSH - VITAMIN D 25 Hydroxy (Vit-D Deficiency, Fractures) - Vitamin B12 - C-reactive protein - POC Hemoccult Bld/Stl (3-Cd Home Screen); Future  6. Elevated sed rate  - Sedimentation rate - C-reactive protein  7. Cough  Seen by Dr. Stevenson Clinch, she tried Omeprazole and Ventolin without help. She wants something for cough. She states Qvar helped symptoms last year.  - DG Chest 2 View; Future - beclomethasone (QVAR) 40 MCG/ACT inhaler; Inhale 2 puffs into the lungs 2 (two) times daily.  Dispense: 1 Inhaler; Refill: 5 - benzonatate (TESSALON) 100 MG capsule; Take 1-2 capsules (100-200 mg total) by mouth 2 (two) times daily as needed.  Dispense: 40 capsule; Refill: 0  8. Controlled gout  - Uric acid  9. Vitamin D deficiency  Took rx vitamin D but is not taking otc vitamin D  10. B12 deficiency  -  Cyanocobalamin 500 MCG SUBL; Place 1 tablet under the tongue daily.  11. Meralgia paresthetica of right side  reassurance

## 2016-01-12 ENCOUNTER — Ambulatory Visit
Admission: RE | Admit: 2016-01-12 | Discharge: 2016-01-12 | Disposition: A | Discharge status: Home / Self Care | Payer: Commercial Managed Care - PPO | Source: Ambulatory Visit | Attending: Family Medicine | Admitting: Family Medicine

## 2016-01-12 ENCOUNTER — Encounter: Payer: Self-pay | Admitting: Family Medicine

## 2016-01-12 DIAGNOSIS — R05 Cough: Secondary | ICD-10-CM | POA: Insufficient documentation

## 2016-01-12 DIAGNOSIS — I700 Atherosclerosis of aorta: Secondary | ICD-10-CM | POA: Insufficient documentation

## 2016-01-12 DIAGNOSIS — M5134 Other intervertebral disc degeneration, thoracic region: Secondary | ICD-10-CM | POA: Insufficient documentation

## 2016-01-21 LAB — FECAL OCCULT BLOOD, GUAIAC: Fecal Occult Blood: NEGATIVE

## 2016-01-27 ENCOUNTER — Other Ambulatory Visit: Payer: Self-pay | Admitting: Family Medicine

## 2016-01-27 DIAGNOSIS — R634 Abnormal weight loss: Secondary | ICD-10-CM

## 2016-01-27 LAB — POC HEMOCCULT BLD/STL (HOME/3-CARD/SCREEN)
Card #2 Fecal Occult Blod, POC: NEGATIVE
FECAL OCCULT BLD: NEGATIVE
Fecal Occult Blood, POC: NEGATIVE

## 2016-04-12 ENCOUNTER — Encounter: Payer: Self-pay | Admitting: Family Medicine

## 2016-04-12 ENCOUNTER — Ambulatory Visit (INDEPENDENT_AMBULATORY_CARE_PROVIDER_SITE_OTHER): Payer: Commercial Managed Care - PPO | Admitting: Family Medicine

## 2016-04-12 VITALS — BP 126/64 | HR 90 | Temp 98.2°F | Resp 18 | Ht 67.0 in | Wt 202.7 lb

## 2016-04-12 DIAGNOSIS — E559 Vitamin D deficiency, unspecified: Secondary | ICD-10-CM | POA: Diagnosis not present

## 2016-04-12 DIAGNOSIS — G5711 Meralgia paresthetica, right lower limb: Secondary | ICD-10-CM | POA: Diagnosis not present

## 2016-04-12 DIAGNOSIS — E1129 Type 2 diabetes mellitus with other diabetic kidney complication: Secondary | ICD-10-CM | POA: Diagnosis not present

## 2016-04-12 DIAGNOSIS — E538 Deficiency of other specified B group vitamins: Secondary | ICD-10-CM

## 2016-04-12 DIAGNOSIS — E785 Hyperlipidemia, unspecified: Secondary | ICD-10-CM | POA: Diagnosis not present

## 2016-04-12 DIAGNOSIS — M109 Gout, unspecified: Secondary | ICD-10-CM | POA: Diagnosis not present

## 2016-04-12 DIAGNOSIS — R809 Proteinuria, unspecified: Secondary | ICD-10-CM | POA: Diagnosis not present

## 2016-04-12 DIAGNOSIS — I10 Essential (primary) hypertension: Secondary | ICD-10-CM

## 2016-04-12 LAB — POCT GLYCOSYLATED HEMOGLOBIN (HGB A1C): HEMOGLOBIN A1C: 6

## 2016-04-12 MED ORDER — GABAPENTIN 100 MG PO CAPS: 100.0000 mg | ORAL_CAPSULE | Freq: Every day | ORAL | 0 refills | Status: DC

## 2016-04-12 MED ORDER — AMLODIPINE BESYLATE-VALSARTAN 5-320 MG PO TABS: 1.0000 | ORAL_TABLET | Freq: Every day | ORAL | 1 refills | Status: DC

## 2016-04-12 MED ORDER — METFORMIN HCL ER 750 MG PO TB24: 750.0000 mg | ORAL_TABLET | Freq: Every day | ORAL | 0 refills | Status: DC

## 2016-04-12 MED ORDER — CARVEDILOL 12.5 MG PO TABS: 12.5000 mg | ORAL_TABLET | Freq: Two times a day (BID) | ORAL | 1 refills | Status: DC

## 2016-04-12 MED ORDER — ATORVASTATIN CALCIUM 40 MG PO TABS: 40.0000 mg | ORAL_TABLET | Freq: Every day | ORAL | 1 refills | Status: DC

## 2016-04-12 MED ORDER — ALLOPURINOL 100 MG PO TABS: 100.0000 mg | ORAL_TABLET | Freq: Every day | ORAL | 1 refills | Status: DC

## 2016-04-12 NOTE — Progress Notes (Signed)
Name: Cheryl Barr   MRN: [id number]    DOB: Jun 22, 1955   Date:04/12/2016       Progress Note  Subjective  Chief Complaint  Chief Complaint  Patient presents with  . Medication Refill    4 month F/U  . Diabetes    Checks BS about once to twice a month,  Average-125  . Hyperlipidemia    Denies any symptoms  . Hypertension    BP has been improving and has lose 3 pounds since last visit  . Back Pain    Onset-6 months, pain in shooting down her right leg with a burning sensation.  . Gout    Been controlled with medication    HPI  HTN: compliant with medication She denies side effects, no chest pain, palpitation or SOB.Off diuretics because of gout.  Hyperlipidemia: last LDL was at goal, continue high dose statin therapy, denies side effects of medication and is compliant with medications, LDL is at goal . She will return fasting for lab work   DMII with microalbuminuria: urine micro was 100 in Dec 2016 and Nov 2017 Taking ARB, and aspirin, also Metfomin, recently noticed tingling on right outer thigh, no weakness.   Eye exam is not up to date - but she will schedule an appointment . No polyphagia, polydipsia or polyuria, she has nocturia, but she states secondary to hot flashes and needs to drink water during the night to control her body temperature. She would like to be treated for numbness on lateral aspect of right thigh.   Gout: . She is back on Allopurinol now . Doing well at this time. No recent episodes  Obesity: she was losing without dietary changes,but now she is concerned because she continues to lose weight  She has lost 13 lbs in the past year. We will check labs . She has stopped drinking sodas  Cough: seen by Dr. 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 She states symptoms when she has an URI. She has not been using Qvar in months now. No wheezing, very seldom has a dry cough, but no longer waking up at  night.   HIstory of intraductal papilloma of breast: negative for atypia or cancer, mammogram is up to date  Patient Active Problem List   Diagnosis Date Noted  . Thoracic aortic atherosclerosis ([place]) 01/12/2016  . 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 B12 deficiency 01/11/2016  . Hernia of abdominal cavity 09/15/2014  . Impaired renal function 08/10/2014  . 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 Dyslipidemia 08/10/2014  . Interval gout 08/10/2014  . Microalbuminuria 08/10/2014  . Morbid obesity ([place]) 08/10/2014  . Osteoarthritis of left knee 08/10/2014  . Vitamin D deficiency 08/10/2014  . Cough 01/21/2014  . Intraductal papilloma of breast 06/27/2013  . Hypertension, essential, benign 09/02/2009  . ABNORMAL EKG 09/02/2009    Past Surgical History:  Procedure Laterality Date  . BREAST BIOPSY Left 05/24/2013   neg  . BREAST EXCISIONAL BIOPSY Left 06/21/2013   neg/ dr. Bary Castilla  . BREAST SURGERY Left 2015  . COLONOSCOPY  2009   1 polyp found. Dr. Roselyn Reef  . HERNIA REPAIR  95/08/2255   Ventral/umbilical hernia repair with 10 x 10 cm Atrium mesh in preperitoneal position.  . INSERTION OF MESH N/A 10/09/2014   Procedure: INSERTION OF MESH;  Surgeon: Robert Bellow, MD;  Location: [facility] ORS;  Service: General;  Laterality: N/A;  .  KNEE SURGERY Left 01/14/2009  . STRABISMUS SURGERY     Repair as a child  . TUBAL LIGATION  1990  . VENTRAL HERNIA REPAIR N/A 10/09/2014   Procedure: HERNIA REPAIR VENTRAL ADULT;  Surgeon: Robert Bellow, MD;  Location: [facility] ORS;  Service: General;  Laterality: N/A;    Family History  Problem Relation Age of Onset  . Hypertension Mother   . Diabetes Mother   . Heart attack Father   . Coronary artery disease Father   . Breast cancer Father 60  . Allergies Son     Barista  . Diabetes Sister   . Diabetes Brother   . Gout Brother     Social History   Social History  . Marital  status: Widowed    Spouse name: N/A  . Number of children: 3  . Years of education: 12th Grade   Occupational History  .  [employer]   Social History Main Topics  . Smoking status: Former Smoker    Types: Cigarettes    Quit date: 10/02/1983  . Smokeless tobacco: Never Used  . Alcohol use No  . Drug use: No  . Sexual activity: Yes    Partners: Male   Other Topics Concern  . Not on file   Social History Narrative   Widowed.    No regular exercise.     Current Outpatient Prescriptions:  .  allopurinol (ZYLOPRIM) 100 MG tablet, Take 1 tablet (100 mg total) by mouth daily., Disp: 90 tablet, Rfl: 1 .  amLODipine-valsartan (EXFORGE) 5-320 MG tablet, Take 1 tablet by mouth daily., Disp: 90 tablet, Rfl: 1 .  aspirin 81 MG EC tablet, Take 81 mg by mouth daily.  , Disp: , Rfl:  .  atorvastatin (LIPITOR) 40 MG tablet, Take 1 tablet (40 mg total) by mouth daily., Disp: 90 tablet, Rfl: 1 .  carvedilol (COREG) 12.5 MG tablet, TAKE ONE TABLET BY MOUTH TWICE DAILY WITH MEALS, Disp: 180 tablet, Rfl: 0 .  Cyanocobalamin 500 MCG SUBL, Place 1 tablet under the tongue daily., Disp: , Rfl:  .  glucose blood (FREESTYLE LITE) test strip, FREESTYLE LITE TEST (In Vitro Strip)  check fsbs once a day for 0 days  Quantity: 100.00;  Refills: 5   Ordered :31-Dec-2009  Steele Sizer MD;  Dani Gobble Active Comments: DX: 250.00, Disp: , Rfl:  .  LANCETS ULTRA THIN 30G MISC, , Disp: , Rfl:  .  metFORMIN (GLUCOPHAGE) 1000 MG tablet, Take 1 tablet (1,000 mg total) by mouth 2 (two) times daily with a meal., Disp: 180 tablet, Rfl: 1  No Known Allergies   ROS  Constitutional: Negative for fever, positive for weight change.  Respiratory: Positive  for mild cough ( improved ) no shortness of breath.   Cardiovascular: Negative for chest pain or palpitations.  Gastrointestinal: Negative for abdominal pain, no bowel changes.  Musculoskeletal: Negative for gait problem or joint swelling.  Skin: Negative  for rash.  Neurological: Negative for dizziness or headache.  No other specific complaints in a complete review of systems (except as listed in HPI above).  Objective  Vitals:   04/12/16 1451  BP: 126/64  Pulse: 90  Resp: 18  Temp: 98.2 F (36.8 C)  TempSrc: Oral  SpO2: 96%  Weight: 202 lb 11.2 oz (91.9 kg)  Height: 5\' 7"  (1.702 m)    Body mass index is 31.75 kg/m.  Physical Exam  Constitutional: Patient appears well-developed and well-nourished. Obese  No distress.  HEENT: head atraumatic, normocephalic, pupils equal and reactive to light,  neck supple, throat within normal limits Cardiovascular: Normal rate, regular rhythm and normal heart sounds.  No murmur heard. No BLE edema. Pulmonary/Chest: Effort normal and breath sounds normal. No respiratory distress. Abdominal: Soft.  There is no tenderness. Psychiatric: Patient has a normal mood and affect. behavior is normal. Judgment and thought content normal. Muscular Skeletal: no pain during palpation of lumbar spine, negative straight leg raise, normal ROM lumbar spine.   Recent Results (from the past 2160 hour(s))  Fecal Occult Blood, Guaiac     Status: None   Collection Time: 01/21/16 12:00 AM  Result Value Ref Range   Fecal Occult Blood Negative   POC Hemoccult Bld/Stl (3-Cd Home Screen)     Status: Normal   Collection Time: 01/27/16  2:36 PM  Result Value Ref Range   Card #1 Date 01/21/2016    Fecal Occult Blood, POC Negative Negative   Card #2 Date 01/21/2016    Card #2 Fecal Occult Blod, POC Negative    Card #3 Date 01/22/2016    Card #3 Fecal Occult Blood, POC Negative   POCT HgB A1C     Status: Normal   Collection Time: 04/12/16  2:58 PM  Result Value Ref Range   Hemoglobin A1C 6.0       PHQ2/9: Depression screen [facility] 2/9 04/12/2016 01/11/2016 09/07/2015 06/05/2015 05/07/2015  Decreased Interest 0 0 0 0 0  Down, Depressed, Hopeless 0 0 0 0 0  PHQ - 2 Score 0 0 0 0 0     Fall Risk: Fall Risk   04/12/2016 01/11/2016 09/07/2015 06/05/2015 05/07/2015  Falls in the past year? No No No No No     Functional Status Survey: Is the patient deaf or have difficulty hearing?: No Does the patient have difficulty seeing, even when wearing glasses/contacts?: No Does the patient have difficulty concentrating, remembering, or making decisions?: No Does the patient have difficulty walking or climbing stairs?: No Does the patient have difficulty dressing or bathing?: No Does the patient have difficulty doing errands alone such as visiting a doctor's office or shopping?: No    Assessment & Plan  1. Controlled type 2 diabetes mellitus with microalbuminuria, without long-term current use of insulin (HCC)  - POCT HgB A1C - metFORMIN (GLUCOPHAGE-XR) 750 MG 24 hr tablet; Take 1 tablet (750 mg total) by mouth daily with breakfast.  Dispense: 90 tablet; Refill: 0  2. Morbid obesity, unspecified obesity type [facility])  She is concerned about her weight loss, she is eating more but losing weight, we will check labs that were requested in Nov - she states she had it done but not found on [facility] or [facility].   3. Hypertension, essential, benign  - amLODipine-valsartan (EXFORGE) 5-320 MG tablet; Take 1 tablet by mouth daily.  Dispense: 90 tablet; Refill: 1 - carvedilol (COREG) 12.5 MG tablet; Take 1 tablet (12.5 mg total) by mouth 2 (two) times daily with a meal.  Dispense: 180 tablet; Refill: 1  4. Dyslipidemia  - atorvastatin (LIPITOR) 40 MG tablet; Take 1 tablet (40 mg total) by mouth daily.  Dispense: 90 tablet; Refill: 1  5. Controlled gout  - allopurinol (ZYLOPRIM) 100 MG tablet; Take 1 tablet (100 mg total) by mouth daily.  Dispense: 90 tablet; Refill: 1  6. B12 deficiency  Continue supplementation   7. Vitamin D deficiency  Continue supplementation   8. Meralgia paraesthetica, right  She wants to try medication because it bothers  her at night, discussed possible side effects - gabapentin  (NEURONTIN) 100 MG capsule; Take 1-3 capsules (100-300 mg total) by mouth at bedtime.  Dispense: 60 capsule; Refill: 0

## 2016-04-13 ENCOUNTER — Other Ambulatory Visit: Payer: Self-pay | Admitting: Family Medicine

## 2016-04-13 LAB — CBC WITH DIFFERENTIAL/PLATELET
BASOS ABS: 0 {cells}/uL (ref 0–200)
Basophils Relative: 0 %
EOS ABS: 130 {cells}/uL (ref 15–500)
Eosinophils Relative: 2 %
HEMATOCRIT: 34.6 % — AB (ref 35.0–45.0)
HEMOGLOBIN: 11.3 g/dL — AB (ref 11.7–15.5)
LYMPHS ABS: 2470 {cells}/uL (ref 850–3900)
Lymphocytes Relative: 38 %
MCH: 26.5 pg — AB (ref 27.0–33.0)
MCHC: 32.7 g/dL (ref 32.0–36.0)
MCV: 81.2 fL (ref 80.0–100.0)
MONOS PCT: 4 %
MPV: 8.7 fL (ref 7.5–12.5)
Monocytes Absolute: 260 cells/uL (ref 200–950)
NEUTROS ABS: 3640 {cells}/uL (ref 1500–7800)
Neutrophils Relative %: 56 %
Platelets: 328 10*3/uL (ref 140–400)
RBC: 4.26 MIL/uL (ref 3.80–5.10)
RDW: 16.4 % — ABNORMAL HIGH (ref 11.0–15.0)
WBC: 6.5 10*3/uL (ref 3.8–10.8)

## 2016-04-13 LAB — COMPLETE METABOLIC PANEL WITH GFR
ALT: 13 U/L (ref 6–29)
AST: 13 U/L (ref 10–35)
Albumin: 4.1 g/dL (ref 3.6–5.1)
Alkaline Phosphatase: 68 U/L (ref 33–130)
BILIRUBIN TOTAL: 0.3 mg/dL (ref 0.2–1.2)
BUN: 28 mg/dL — ABNORMAL HIGH (ref 7–25)
CO2: 26 mmol/L (ref 20–31)
Calcium: 9.3 mg/dL (ref 8.6–10.4)
Chloride: 106 mmol/L (ref 98–110)
Creat: 0.91 mg/dL (ref 0.50–0.99)
GFR, EST NON AFRICAN AMERICAN: 69 mL/min (ref >=60)
GFR, Est African American: 79 mL/min (ref >=60)
GLUCOSE: 103 mg/dL — AB (ref 65–99)
Potassium: 4.1 mmol/L (ref 3.5–5.3)
SODIUM: 142 mmol/L (ref 135–146)
TOTAL PROTEIN: 7.6 g/dL (ref 6.1–8.1)

## 2016-04-13 LAB — LIPID PANEL
CHOL/HDL RATIO: 2.5 ratio (ref <5.0)
Cholesterol: 122 mg/dL (ref <200)
HDL: 49 mg/dL — AB (ref >50)
LDL CALC: 56 mg/dL (ref <100)
Triglycerides: 84 mg/dL (ref <150)
VLDL: 17 mg/dL (ref <30)

## 2016-04-13 LAB — URIC ACID: URIC ACID, SERUM: 6.3 mg/dL (ref 2.5–7.0)

## 2016-04-13 LAB — VITAMIN B12: Vitamin B-12: 697 pg/mL (ref 200–1100)

## 2016-04-13 LAB — TSH: TSH: 1.65 mIU/L

## 2016-04-14 ENCOUNTER — Other Ambulatory Visit: Payer: Self-pay | Admitting: Family Medicine

## 2016-04-14 DIAGNOSIS — D649 Anemia, unspecified: Secondary | ICD-10-CM

## 2016-04-14 LAB — C-REACTIVE PROTEIN: CRP: 5.1 mg/L (ref <8.0)

## 2016-04-14 LAB — VITAMIN D 25 HYDROXY (VIT D DEFICIENCY, FRACTURES): VIT D 25 HYDROXY: 26 ng/mL — AB (ref 30–100)

## 2016-04-14 LAB — SEDIMENTATION RATE: Sed Rate: 27 mm/hr (ref 0–30)

## 2016-04-23 ENCOUNTER — Other Ambulatory Visit: Payer: Self-pay | Admitting: Family Medicine

## 2016-04-23 DIAGNOSIS — I10 Essential (primary) hypertension: Secondary | ICD-10-CM

## 2016-04-27 ENCOUNTER — Other Ambulatory Visit: Payer: Self-pay | Admitting: Family Medicine

## 2016-04-27 DIAGNOSIS — I10 Essential (primary) hypertension: Secondary | ICD-10-CM

## 2016-07-26 ENCOUNTER — Encounter: Payer: Self-pay | Admitting: Family Medicine

## 2016-07-26 ENCOUNTER — Ambulatory Visit (INDEPENDENT_AMBULATORY_CARE_PROVIDER_SITE_OTHER): Payer: Commercial Managed Care - PPO | Admitting: Family Medicine

## 2016-07-26 VITALS — BP 142/86 | HR 82 | Temp 98.4°F | Resp 16 | Ht 67.0 in | Wt 207.7 lb

## 2016-07-26 DIAGNOSIS — M75112 Incomplete rotator cuff tear or rupture of left shoulder, not specified as traumatic: Secondary | ICD-10-CM | POA: Diagnosis not present

## 2016-07-26 DIAGNOSIS — E538 Deficiency of other specified B group vitamins: Secondary | ICD-10-CM | POA: Diagnosis not present

## 2016-07-26 DIAGNOSIS — G5711 Meralgia paresthetica, right lower limb: Secondary | ICD-10-CM | POA: Diagnosis not present

## 2016-07-26 DIAGNOSIS — E559 Vitamin D deficiency, unspecified: Secondary | ICD-10-CM | POA: Diagnosis not present

## 2016-07-26 DIAGNOSIS — I10 Essential (primary) hypertension: Secondary | ICD-10-CM | POA: Diagnosis not present

## 2016-07-26 DIAGNOSIS — E1129 Type 2 diabetes mellitus with other diabetic kidney complication: Secondary | ICD-10-CM

## 2016-07-26 DIAGNOSIS — E785 Hyperlipidemia, unspecified: Secondary | ICD-10-CM

## 2016-07-26 DIAGNOSIS — I700 Atherosclerosis of aorta: Secondary | ICD-10-CM | POA: Diagnosis not present

## 2016-07-26 DIAGNOSIS — Z1231 Encounter for screening mammogram for malignant neoplasm of breast: Secondary | ICD-10-CM | POA: Diagnosis not present

## 2016-07-26 DIAGNOSIS — R809 Proteinuria, unspecified: Secondary | ICD-10-CM

## 2016-07-26 DIAGNOSIS — D649 Anemia, unspecified: Secondary | ICD-10-CM | POA: Diagnosis not present

## 2016-07-26 DIAGNOSIS — Z1239 Encounter for other screening for malignant neoplasm of breast: Secondary | ICD-10-CM

## 2016-07-26 LAB — CBC WITH DIFFERENTIAL/PLATELET
BASOS PCT: 0 %
Basophils Absolute: 0 cells/uL (ref 0–200)
EOS ABS: 124 {cells}/uL (ref 15–500)
Eosinophils Relative: 2 %
HEMATOCRIT: 35.5 % (ref 35.0–45.0)
Hemoglobin: 11.1 g/dL — ABNORMAL LOW (ref 11.7–15.5)
Lymphocytes Relative: 47 %
Lymphs Abs: 2914 cells/uL (ref 850–3900)
MCH: 24.8 pg — ABNORMAL LOW (ref 27.0–33.0)
MCHC: 31.3 g/dL — ABNORMAL LOW (ref 32.0–36.0)
MCV: 79.2 fL — AB (ref 80.0–100.0)
MPV: 8.9 fL (ref 7.5–12.5)
Monocytes Absolute: 310 cells/uL (ref 200–950)
Monocytes Relative: 5 %
NEUTROS ABS: 2852 {cells}/uL (ref 1500–7800)
Neutrophils Relative %: 46 %
Platelets: 316 10*3/uL (ref 140–400)
RBC: 4.48 MIL/uL (ref 3.80–5.10)
RDW: 16.3 % — ABNORMAL HIGH (ref 11.0–15.0)
WBC: 6.2 10*3/uL (ref 3.8–10.8)

## 2016-07-26 LAB — POCT GLYCOSYLATED HEMOGLOBIN (HGB A1C): Hemoglobin A1C: 6.1

## 2016-07-26 MED ORDER — CARVEDILOL 12.5 MG PO TABS: 12.5000 mg | ORAL_TABLET | Freq: Two times a day (BID) | ORAL | 1 refills | Status: DC

## 2016-07-26 MED ORDER — AMLODIPINE BESYLATE-VALSARTAN 10-320 MG PO TABS: 1.0000 | ORAL_TABLET | Freq: Every day | ORAL | 0 refills | Status: DC

## 2016-07-26 MED ORDER — GABAPENTIN 300 MG PO CAPS: 300.0000 mg | ORAL_CAPSULE | Freq: Every day | ORAL | 0 refills | Status: DC

## 2016-07-26 MED ORDER — METFORMIN HCL ER 750 MG PO TB24: 750.0000 mg | ORAL_TABLET | Freq: Every day | ORAL | 1 refills | Status: DC

## 2016-07-26 MED ORDER — ATORVASTATIN CALCIUM 40 MG PO TABS: 40.0000 mg | ORAL_TABLET | Freq: Every day | ORAL | 1 refills | Status: DC

## 2016-07-26 NOTE — Patient Instructions (Signed)
Shoulder Exercises Ask your health care provider which exercises are safe for you. Do exercises exactly as told by your health care provider and adjust them as directed. It is normal to feel mild stretching, pulling, tightness, or discomfort as you do these exercises, but you should stop right away if you feel sudden pain or your pain gets worse.Do not begin these exercises until told by your health care provider. RANGE OF MOTION EXERCISES These exercises warm up your muscles and joints and improve the movement and flexibility of your shoulder. These exercises also help to relieve pain, numbness, and tingling. These exercises involve stretching your injured shoulder directly. Exercise A: Pendulum  1. Stand near a wall or a surface that you can hold onto for balance. 2. Bend at the waist and let your left / right arm hang straight down. Use your other arm to support you. Keep your back straight and do not lock your knees. 3. Relax your left / right arm and shoulder muscles, and move your hips and your trunk so your left / right arm swings freely. Your arm should swing because of the motion of your body, not because you are using your arm or shoulder muscles. 4. Keep moving your body so your arm swings in the following directions, as told by your health care provider: ? Side to side. ? Forward and backward. ? In clockwise and counterclockwise circles. 5. Continue each motion for __________ seconds, or for as long as told by your health care provider. 6. Slowly return to the starting position. Repeat __________ times. Complete this exercise __________ times a day. Exercise B:Flexion, Standing  1. Stand and hold a broomstick, a cane, or a similar object. Place your hands a little more than shoulder-width apart on the object. Your left / right hand should be palm-up, and your other hand should be palm-down. 2. Keep your elbow straight and keep your shoulder muscles relaxed. Push the stick down with  your healthy arm to raise your left / right arm in front of your body, and then over your head until you feel a stretch in your shoulder. ? Avoid shrugging your shoulder while you raise your arm. Keep your shoulder blade tucked down toward the middle of your back. 3. Hold for __________ seconds. 4. Slowly return to the starting position. Repeat __________ times. Complete this exercise __________ times a day. Exercise C: Abduction, Standing 1. Stand and hold a broomstick, a cane, or a similar object. Place your hands a little more than shoulder-width apart on the object. Your left / right hand should be palm-up, and your other hand should be palm-down. 2. While keeping your elbow straight and your shoulder muscles relaxed, push the stick across your body toward your left / right side. Raise your left / right arm to the side of your body and then over your head until you feel a stretch in your shoulder. ? Do not raise your arm above shoulder height, unless your health care provider tells you to do that. ? Avoid shrugging your shoulder while you raise your arm. Keep your shoulder blade tucked down toward the middle of your back. 3. Hold for __________ seconds. 4. Slowly return to the starting position. Repeat __________ times. Complete this exercise __________ times a day. Exercise D:Internal Rotation  1. Place your left / right hand behind your back, palm-up. 2. Use your other hand to dangle an exercise band, a towel, or a similar object over your shoulder. Grasp the band with   your left / right hand so you are holding onto both ends. 3. Gently pull up on the band until you feel a stretch in the front of your left / right shoulder. ? Avoid shrugging your shoulder while you raise your arm. Keep your shoulder blade tucked down toward the middle of your back. 4. Hold for __________ seconds. 5. Release the stretch by letting go of the band and lowering your hands. Repeat __________ times. Complete  this exercise __________ times a day. STRETCHING EXERCISES These exercises warm up your muscles and joints and improve the movement and flexibility of your shoulder. These exercises also help to relieve pain, numbness, and tingling. These exercises are done using your healthy shoulder to help stretch the muscles of your injured shoulder. Exercise E: Corner Stretch (External Rotation and Abduction)  1. Stand in a doorway with one of your feet slightly in front of the other. This is called a staggered stance. If you cannot reach your forearms to the door frame, stand facing a corner of a room. 2. Choose one of the following positions as told by your health care provider: ? Place your hands and forearms on the door frame above your head. ? Place your hands and forearms on the door frame at the height of your head. ? Place your hands on the door frame at the height of your elbows. 3. Slowly move your weight onto your front foot until you feel a stretch across your chest and in the front of your shoulders. Keep your head and chest upright and keep your abdominal muscles tight. 4. Hold for __________ seconds. 5. To release the stretch, shift your weight to your back foot. Repeat __________ times. Complete this stretch __________ times a day. Exercise F:Extension, Standing 1. Stand and hold a broomstick, a cane, or a similar object behind your back. ? Your hands should be a little wider than shoulder-width apart. ? Your palms should face away from your back. 2. Keeping your elbows straight and keeping your shoulder muscles relaxed, move the stick away from your body until you feel a stretch in your shoulder. ? Avoid shrugging your shoulders while you move the stick. Keep your shoulder blade tucked down toward the middle of your back. 3. Hold for __________ seconds. 4. Slowly return to the starting position. Repeat __________ times. Complete this exercise __________ times a day. STRENGTHENING  EXERCISES These exercises build strength and endurance in your shoulder. Endurance is the ability to use your muscles for a long time, even after they get tired. Exercise G:External Rotation  1. Sit in a stable chair without armrests. 2. Secure an exercise band at elbow height on your left / right side. 3. Place a soft object, such as a folded towel or a small pillow, between your left / right upper arm and your body to move your elbow a few inches away (about 10 cm) from your side. 4. Hold the end of the band so it is tight and there is no slack. 5. Keeping your elbow pressed against the soft object, move your left / right forearm out, away from your abdomen. Keep your body steady so only your forearm moves. 6. Hold for __________ seconds. 7. Slowly return to the starting position. Repeat __________ times. Complete this exercise __________ times a day. Exercise H:Shoulder Abduction  1. Sit in a stable chair without armrests, or stand. 2. Hold a __________ weight in your left / right hand, or hold an exercise band with both hands.   3. Start with your arms straight down and your left / right palm facing in, toward your body. 4. Slowly lift your left / right hand out to your side. Do not lift your hand above shoulder height unless your health care provider tells you that this is safe. ? Keep your arms straight. ? Avoid shrugging your shoulder while you do this movement. Keep your shoulder blade tucked down toward the middle of your back. 5. Hold for __________ seconds. 6. Slowly lower your arm, and return to the starting position. Repeat __________ times. Complete this exercise __________ times a day. Exercise I:Shoulder Extension 1. Sit in a stable chair without armrests, or stand. 2. Secure an exercise band to a stable object in front of you where it is at shoulder height. 3. Hold one end of the exercise band in each hand. Your palms should face each other. 4. Straighten your elbows and  lift your hands up to shoulder height. 5. Step back, away from the secured end of the exercise band, until the band is tight and there is no slack. 6. Squeeze your shoulder blades together as you pull your hands down to the sides of your thighs. Stop when your hands are straight down by your sides. Do not let your hands go behind your body. 7. Hold for __________ seconds. 8. Slowly return to the starting position. Repeat __________ times. Complete this exercise __________ times a day. Exercise J:Standing Shoulder Row 1. Sit in a stable chair without armrests, or stand. 2. Secure an exercise band to a stable object in front of you so it is at waist height. 3. Hold one end of the exercise band in each hand. Your palms should be in a thumbs-up position. 4. Bend each of your elbows to an "L" shape (about 90 degrees) and keep your upper arms at your sides. 5. Step back until the band is tight and there is no slack. 6. Slowly pull your elbows back behind you. 7. Hold for __________ seconds. 8. Slowly return to the starting position. Repeat __________ times. Complete this exercise __________ times a day. Exercise K:Shoulder Press-Ups  1. Sit in a stable chair that has armrests. Sit upright, with your feet flat on the floor. 2. Put your hands on the armrests so your elbows are bent and your fingers are pointing forward. Your hands should be about even with the sides of your body. 3. Push down on the armrests and use your arms to lift yourself off of the chair. Straighten your elbows and lift yourself up as much as you comfortably can. ? Move your shoulder blades down, and avoid letting your shoulders move up toward your ears. ? Keep your feet on the ground. As you get stronger, your feet should support less of your body weight as you lift yourself up. 4. Hold for __________ seconds. 5. Slowly lower yourself back into the chair. Repeat __________ times. Complete this exercise __________ times a  day. Exercise L: Wall Push-Ups  1. Stand so you are facing a stable wall. Your feet should be about one arm-length away from the wall. 2. Lean forward and place your palms on the wall at shoulder height. 3. Keep your feet flat on the floor as you bend your elbows and lean forward toward the wall. 4. Hold for __________ seconds. 5. Straighten your elbows to push yourself back to the starting position. Repeat __________ times. Complete this exercise __________ times a day. This information is not intended to replace advice   given to you by your health care provider. Make sure you discuss any questions you have with your health care provider. Document Released: 12/15/2004 Document Revised: 10/26/2015 Document Reviewed: 10/12/2014 Elsevier Interactive Patient Education  2018 [employer]  

## 2016-07-26 NOTE — Progress Notes (Signed)
Name: Cheryl Barr   MRN: [id number]    DOB: February 06, 1956   Date:07/26/2016       Progress Note  Subjective  Chief Complaint  Chief Complaint  Patient presents with  . Medication Refill    4 month F/U  . Diabetes    Checks weekly runs around 120-130  . Hypertension    Denies any symptoms  . Hyperlipidemia  . Gout  . Shoulder Pain    Left Shoulder aches for the past 2 weeks     HPI   HTN: compliant with medication, but elevated recently, she states since we stopped HCTZ.  Shedenies side effects, no chest pain, palpitation or SOB.Off diuretics because of gout.  Hyperlipidemia: last LDL was at goal, continue high dose statin therapy, denies side effects of medication and is compliant with medications, LDL is at goal .  Reviewed labs done in Feb with patient and is looking good. Atherosclerosis aorta, continue statin and aspirin  DMII with microalbuminuria: urine micro was 100 in Dec 2016 and Nov 2017Taking ARB, and aspirin, also Metfomin, she has symptoms of meralgia paresthetica on right side for the past 4 months, gabapentin did not work, but she only took 100 mg, we will try increasing dose today. Eye exam is not up to date - but she will schedule an appointment . No polyphagia, polydipsia or polyuria, she has nocturia, but she states secondary to hot flashes and needs to drink water during the night to control her body temperature.  Gout: . She is back on Allopurinol now . Doing well at this time. No recent episodes  Obesity: she was losing without dietary changes, but now she was  concerned because she continued to lose weight She had lost 13 lbs in the past year, but has been eating more and gained 4.7 lbs since last visit..  She has stopped drinking sodas  Cough: seen by Dr. 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 She states symptoms when she has an URI. She has not been using Qvar in months now. No wheezing,  very seldom has a dry cough, but no longer waking up at night. She had another cold recently but cough is subsiding now, she does not want to go back to Pulmonologist.   HIstory of intraductal papilloma of breast: negative for atypia or cancer, mammogram is due  Left shoulder pain: had MRI back in 2006 that showed partial rotator cuff tear, symptoms improved with steroid injection, but over the past couple of months, she has noticed recurrence of pain when lifting, or wearing purse on left shoulder, no tingling or weakness at this time, pain is not constant, but gets up to 7/10 at times.  Patient Active Problem List   Diagnosis Date Noted  . Incomplete rotator cuff tear or rupture of left shoulder, not specified as traumatic 07/26/2016  . Thoracic aortic atherosclerosis ([place]) 01/12/2016  . 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 B12 deficiency 01/11/2016  . Hernia of abdominal cavity 09/15/2014  . Impaired renal function 08/10/2014  . 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 Dyslipidemia 08/10/2014  . Interval gout 08/10/2014  . Microalbuminuria 08/10/2014  . Morbid obesity ([place]) 08/10/2014  . Osteoarthritis of left knee 08/10/2014  . Vitamin D deficiency 08/10/2014  . Cough 01/21/2014  . Intraductal papilloma of breast 06/27/2013  . Hypertension, essential, benign 09/02/2009  . ABNORMAL EKG 09/02/2009    Past Surgical History:  Procedure Laterality Date  . BREAST BIOPSY Left 05/24/2013   neg  . BREAST EXCISIONAL BIOPSY Left 06/21/2013   neg/ dr. Bary Castilla  . BREAST SURGERY Left 2015  . COLONOSCOPY  2009   1 polyp found. Dr. Roselyn Reef  . HERNIA REPAIR  [id number]/[id number]   Ventral/umbilical hernia repair with 10 x 10 cm Atrium mesh in preperitoneal position.  . INSERTION OF MESH N/A 10/09/2014   Procedure: INSERTION OF MESH;  Surgeon: Robert Bellow, MD;  Location: [facility] ORS;  Service: General;  Laterality: N/A;  . KNEE SURGERY Left 01/14/2009  .  STRABISMUS SURGERY     Repair as a child  . TUBAL LIGATION  1990  . VENTRAL HERNIA REPAIR N/A 10/09/2014   Procedure: HERNIA REPAIR VENTRAL ADULT;  Surgeon: Robert Bellow, MD;  Location: [facility] ORS;  Service: General;  Laterality: N/A;    Family History  Problem Relation Age of Onset  . Hypertension Mother   . Diabetes Mother   . Heart attack Father   . Coronary artery disease Father   . Breast cancer Father 23  . Allergies Son        Barista  . Diabetes Sister   . Diabetes Brother   . Gout Brother     Social History   Social History  . Marital status: Widowed    Spouse name: N/A  . Number of children: 3  . Years of education: 12th Grade   Occupational History  .  [employer]   Social History Main Topics  . Smoking status: Former Smoker    Types: Cigarettes    Quit date: 10/02/1983  . Smokeless tobacco: Never Used  . Alcohol use No  . Drug use: No  . Sexual activity: Yes    Partners: Male   Other Topics Concern  . Not on file   Social History Narrative   Widowed.    No regular exercise.     Current Outpatient Prescriptions:  .  allopurinol (ZYLOPRIM) 100 MG tablet, Take 1 tablet (100 mg total) by mouth daily., Disp: 90 tablet, Rfl: 1 .  aspirin 81 MG EC tablet, Take 81 mg by mouth daily.  , Disp: , Rfl:  .  atorvastatin (LIPITOR) 40 MG tablet, Take 1 tablet (40 mg total) by mouth daily., Disp: 90 tablet, Rfl: 1 .  carvedilol (COREG) 12.5 MG tablet, Take 1 tablet (12.5 mg total) by mouth 2 (two) times daily with a meal., Disp: 180 tablet, Rfl: 1 .  Cyanocobalamin 500 MCG SUBL, Place 1 tablet under the tongue daily., Disp: , Rfl:  .  gabapentin (NEURONTIN) 300 MG capsule, Take 1 capsule (300 mg total) by mouth at bedtime., Disp: 30 capsule, Rfl: 0 .  glucose blood (FREESTYLE LITE) test strip, FREESTYLE LITE TEST (In Vitro Strip)  check fsbs once a day for 0 days  Quantity: 100.00;  Refills: 5   Ordered :31-Dec-2009  Steele Sizer MD;  Dani Gobble Active Comments: DX: 250.00, Disp: , Rfl:  .  LANCETS ULTRA THIN 30G MISC, , Disp: , Rfl:  .  metFORMIN (GLUCOPHAGE-XR) 750 MG 24 hr tablet, Take 1 tablet (750 mg total) by mouth daily with breakfast., Disp: 90 tablet, Rfl: 1 .  amLODipine-valsartan (EXFORGE) 10-320 MG tablet, Take 1 tablet by mouth daily., Disp: 90 tablet, Rfl: 0  No Known Allergies   ROS  Constitutional: Negative for fever or significant  weight change.  Respiratory: Positive for intermittent cough ( had a cold recently )  but no shortness of breath.   Cardiovascular: Negative for chest pain or palpitations.  Gastrointestinal: Negative for abdominal pain, no bowel changes.  Musculoskeletal: Negative for gait problem or joint swelling.  Skin: Negative for rash.  Neurological: Negative for dizziness or headache.  No other specific complaints in a complete review of systems (except as listed in HPI above).  Objective  Vitals:   07/26/16 1437  BP: (!) 142/86  Pulse: 82  Resp: 16  Temp: 98.4 F (36.9 C)  TempSrc: Oral  SpO2: 96%  Weight: 207 lb 11.2 oz (94.2 kg)  Height: 5\' 7"  (1.702 m)    Body mass index is 32.53 kg/m.  Physical Exam  Constitutional: Patient appears well-developed and well-nourished. Obese  No distress.  HEENT: head atraumatic, normocephalic, pupils equal and reactive to light, neck supple, throat within normal limits Cardiovascular: Normal rate, regular rhythm and normal heart sounds.  No murmur heard. No BLE edema. Pulmonary/Chest: Effort normal and breath sounds normal. No respiratory distress. Abdominal: Soft.  There is no tenderness. Psychiatric: Patient has a normal mood and affect. behavior is normal. Judgment and thought content normal.  Recent Results (from the past 2160 hour(s))  POCT HgB A1C     Status: None   Collection Time: 07/26/16  2:42 PM  Result Value Ref Range   Hemoglobin A1C 6.1     Diabetic Foot Exam: Diabetic Foot Exam - Simple   Simple Foot  Form Diabetic Foot exam was performed with the following findings:  Yes 07/26/2016  3:10 PM  Visual Inspection See comments:  Yes Sensation Testing Intact to touch and monofilament testing bilaterally:  Yes Pulse Check Posterior Tibialis and Dorsalis pulse intact bilaterally:  Yes Comments Thick toenails      PHQ2/9: Depression screen [facility] 2/9 07/26/2016 04/12/2016 01/11/2016 09/07/2015 06/05/2015  Decreased Interest 0 0 0 0 0  Down, Depressed, Hopeless 0 0 0 0 0  PHQ - 2 Score 0 0 0 0 0     Fall Risk: Fall Risk  07/26/2016 04/12/2016 01/11/2016 09/07/2015 06/05/2015  Falls in the past year? No No No No No     Functional Status Survey: Is the patient deaf or have difficulty hearing?: No Does the patient have difficulty seeing, even when wearing glasses/contacts?: No Does the patient have difficulty concentrating, remembering, or making decisions?: No Does the patient have difficulty walking or climbing stairs?: No Does the patient have difficulty dressing or bathing?: No Does the patient have difficulty doing errands alone such as visiting a doctor's office or shopping?: No    Assessment & Plan  1. Controlled type 2 diabetes mellitus with microalbuminuria, without long-term current use of insulin ([place])  At goal, continue medication  - POCT HgB A1C - metFORMIN (GLUCOPHAGE-XR) 750 MG 24 hr tablet; Take 1 tablet (750 mg total) by mouth daily with breakfast.  Dispense: 90 tablet; Refill: 1  2. Incomplete rotator cuff tear or rupture of left shoulder, not specified as traumatic  She had MRI done in 2006 that showed partial tear, she had steroid injection in the past, we will try home exercises, and will call back for referral to Ortho, seen by Dr. Sabra Heck in the past, if no improvement  3. Hypertension, essential, benign  bp has been elevated since we stopped HCTZ, we will increase dose of Exforge and advised monitor bp, to call back in case she gets dizzy - carvedilol (COREG)  12.5 MG tablet; Take 1 tablet (12.5 mg total) by mouth 2 (two) times daily  with a meal.  Dispense: 180 tablet; Refill: 1 - amLODipine-valsartan (EXFORGE) 10-320 MG tablet; Take 1 tablet by mouth daily.  Dispense: 90 tablet; Refill: 0  4. Dyslipidemia  - atorvastatin (LIPITOR) 40 MG tablet; Take 1 tablet (40 mg total) by mouth daily.  Dispense: 90 tablet; Refill: 1  5. B12 deficiency  Needs to continue supplementation   6. Vitamin D deficiency  Continue supplementation   7. Morbid obesity, unspecified obesity type [facility])  Discussed with the patient the risk posed by an increased BMI. Discussed importance of portion control, calorie counting and at least 150 minutes of physical activity weekly. Avoid sweet beverages and drink more water. Eat at least 6 servings of fruit and vegetables daily  She has been eating more trying to gain weight  8. Thoracic aortic atherosclerosis ([facility])  Continue statin and aspirin  9. Meralgia paraesthetica, right  - gabapentin (NEURONTIN) 300 MG capsule; Take 1 capsule (300 mg total) by mouth at bedtime.  Dispense: 30 capsule; Refill: 0  10. Anemia, unspecified type  - Iron, TIBC and Ferritin Panel - CBC with Differential/Platelet  11. Breast cancer screening  - MM Digital Screening; Future

## 2016-07-27 LAB — IRON,TIBC AND FERRITIN PANEL
%SAT: 15 % (ref 11–50)
Ferritin: 36 ng/mL (ref 20–288)
IRON: 51 ug/dL (ref 45–160)
TIBC: 351 ug/dL (ref 250–450)

## 2016-10-12 LAB — HM DIABETES EYE EXAM

## 2016-10-13 ENCOUNTER — Encounter: Payer: Self-pay | Admitting: Family Medicine

## 2016-10-26 ENCOUNTER — Ambulatory Visit (INDEPENDENT_AMBULATORY_CARE_PROVIDER_SITE_OTHER): Payer: Commercial Managed Care - PPO | Admitting: Family Medicine

## 2016-10-26 ENCOUNTER — Encounter: Payer: Self-pay | Admitting: Family Medicine

## 2016-10-26 VITALS — BP 148/86 | HR 78 | Temp 97.7°F | Resp 16 | Ht 67.0 in | Wt 208.8 lb

## 2016-10-26 DIAGNOSIS — Z124 Encounter for screening for malignant neoplasm of cervix: Secondary | ICD-10-CM | POA: Diagnosis not present

## 2016-10-26 DIAGNOSIS — D638 Anemia in other chronic diseases classified elsewhere: Secondary | ICD-10-CM

## 2016-10-26 DIAGNOSIS — I700 Atherosclerosis of aorta: Secondary | ICD-10-CM | POA: Diagnosis not present

## 2016-10-26 DIAGNOSIS — E559 Vitamin D deficiency, unspecified: Secondary | ICD-10-CM

## 2016-10-26 DIAGNOSIS — E1129 Type 2 diabetes mellitus with other diabetic kidney complication: Secondary | ICD-10-CM | POA: Diagnosis not present

## 2016-10-26 DIAGNOSIS — Z23 Encounter for immunization: Secondary | ICD-10-CM | POA: Diagnosis not present

## 2016-10-26 DIAGNOSIS — R059 Cough, unspecified: Secondary | ICD-10-CM

## 2016-10-26 DIAGNOSIS — M109 Gout, unspecified: Secondary | ICD-10-CM

## 2016-10-26 DIAGNOSIS — Z01419 Encounter for gynecological examination (general) (routine) without abnormal findings: Secondary | ICD-10-CM

## 2016-10-26 DIAGNOSIS — R809 Proteinuria, unspecified: Secondary | ICD-10-CM | POA: Diagnosis not present

## 2016-10-26 DIAGNOSIS — I10 Essential (primary) hypertension: Secondary | ICD-10-CM

## 2016-10-26 DIAGNOSIS — R05 Cough: Secondary | ICD-10-CM | POA: Diagnosis not present

## 2016-10-26 DIAGNOSIS — E785 Hyperlipidemia, unspecified: Secondary | ICD-10-CM | POA: Diagnosis not present

## 2016-10-26 DIAGNOSIS — Z0000 Encounter for general adult medical examination without abnormal findings: Secondary | ICD-10-CM

## 2016-10-26 DIAGNOSIS — E538 Deficiency of other specified B group vitamins: Secondary | ICD-10-CM

## 2016-10-26 DIAGNOSIS — Z1211 Encounter for screening for malignant neoplasm of colon: Secondary | ICD-10-CM

## 2016-10-26 DIAGNOSIS — G5711 Meralgia paresthetica, right lower limb: Secondary | ICD-10-CM

## 2016-10-26 DIAGNOSIS — Z1239 Encounter for other screening for malignant neoplasm of breast: Secondary | ICD-10-CM

## 2016-10-26 LAB — POCT GLYCOSYLATED HEMOGLOBIN (HGB A1C): Hemoglobin A1C: 6.2

## 2016-10-26 MED ORDER — CARVEDILOL 25 MG PO TABS: 25.0000 mg | ORAL_TABLET | Freq: Two times a day (BID) | ORAL | 1 refills | Status: DC

## 2016-10-26 MED ORDER — ATORVASTATIN CALCIUM 40 MG PO TABS: 40.0000 mg | ORAL_TABLET | Freq: Every day | ORAL | 1 refills | Status: DC

## 2016-10-26 MED ORDER — ALLOPURINOL 100 MG PO TABS: 100.0000 mg | ORAL_TABLET | Freq: Every day | ORAL | 1 refills | Status: DC

## 2016-10-26 MED ORDER — GABAPENTIN 300 MG PO CAPS: 300.0000 mg | ORAL_CAPSULE | Freq: Every day | ORAL | 1 refills | Status: DC

## 2016-10-26 MED ORDER — AMLODIPINE BESYLATE-VALSARTAN 10-320 MG PO TABS: 1.0000 | ORAL_TABLET | Freq: Every day | ORAL | 1 refills | Status: DC

## 2016-10-26 MED ORDER — METFORMIN HCL ER 750 MG PO TB24: 750.0000 mg | ORAL_TABLET | Freq: Every day | ORAL | 1 refills | Status: DC

## 2016-10-26 NOTE — Patient Instructions (Signed)
Preventive Care 40-64 Years, Female Preventive care refers to lifestyle choices and visits with your health care provider that can promote health and wellness. What does preventive care include?  A yearly physical exam. This is also called an annual well check.  Dental exams once or twice a year.  Routine eye exams. Ask your health care provider how often you should have your eyes checked.  Personal lifestyle choices, including: ? Daily care of your teeth and gums. ? Regular physical activity. ? Eating a healthy diet. ? Avoiding tobacco and drug use. ? Limiting alcohol use. ? Practicing safe sex. ? Taking low-dose aspirin daily starting at age 58. ? Taking vitamin and mineral supplements as recommended by your health care provider. What happens during an annual well check? The services and screenings done by your health care provider during your annual well check will depend on your age, overall health, lifestyle risk factors, and family history of disease. Counseling Your health care provider may ask you questions about your:  Alcohol use.  Tobacco use.  Drug use.  Emotional well-being.  Home and relationship well-being.  Sexual activity.  Eating habits.  Work and work Statistician.  Method of birth control.  Menstrual cycle.  Pregnancy history.  Screening You may have the following tests or measurements:  Height, weight, and BMI.  Blood pressure.  Lipid and cholesterol levels. These may be checked every 5 years, or more frequently if you are over 81 years old.  Skin check.  Lung cancer screening. You may have this screening every year starting at age 78 if you have a 30-pack-year history of smoking and currently smoke or have quit within the past 15 years.  Fecal occult blood test (FOBT) of the stool. You may have this test every year starting at age 65.  Flexible sigmoidoscopy or colonoscopy. You may have a sigmoidoscopy every 5 years or a colonoscopy  every 10 years starting at age 30.  Hepatitis C blood test.  Hepatitis B blood test.  Sexually transmitted disease (STD) testing.  Diabetes screening. This is done by checking your blood sugar (glucose) after you have not eaten for a while (fasting). You may have this done every 1-3 years.  Mammogram. This may be done every 1-2 years. Talk to your health care provider about when you should start having regular mammograms. This may depend on whether you have a family history of breast cancer.  BRCA-related cancer screening. This may be done if you have a family history of breast, ovarian, tubal, or peritoneal cancers.  Pelvic exam and Pap test. This may be done every 3 years starting at age 80. Starting at age 36, this may be done every 5 years if you have a Pap test in combination with an HPV test.  Bone density scan. This is done to screen for osteoporosis. You may have this scan if you are at high risk for osteoporosis.  Discuss your test results, treatment options, and if necessary, the need for more tests with your health care provider. Vaccines Your health care provider may recommend certain vaccines, such as:  Influenza vaccine. This is recommended every year.  Tetanus, diphtheria, and acellular pertussis (Tdap, Td) vaccine. You may need a Td booster every 10 years.  Varicella vaccine. You may need this if you have not been vaccinated.  Zoster vaccine. You may need this after age 5.  Measles, mumps, and rubella (MMR) vaccine. You may need at least one dose of MMR if you were born in  1957 or later. You may also need a second dose.  Pneumococcal 13-valent conjugate (PCV13) vaccine. You may need this if you have certain conditions and were not previously vaccinated.  Pneumococcal polysaccharide (PPSV23) vaccine. You may need one or two doses if you smoke cigarettes or if you have certain conditions.  Meningococcal vaccine. You may need this if you have certain  conditions.  Hepatitis A vaccine. You may need this if you have certain conditions or if you travel or work in places where you may be exposed to hepatitis A.  Hepatitis B vaccine. You may need this if you have certain conditions or if you travel or work in places where you may be exposed to hepatitis B.  Haemophilus influenzae type b (Hib) vaccine. You may need this if you have certain conditions.  Talk to your health care provider about which screenings and vaccines you need and how often you need them. This information is not intended to replace advice given to you by your health care provider. Make sure you discuss any questions you have with your health care provider. Document Released: 02/27/2015 Document Revised: 10/21/2015 Document Reviewed: 12/02/2014 Elsevier Interactive Patient Education  2017 [employer].

## 2016-10-26 NOTE — Progress Notes (Signed)
Name: Cheryl Barr   MRN: [id number]    DOB: Mar 15, 1955   Date:10/26/2016       Progress Note  Subjective  Chief Complaint  Chief Complaint  Patient presents with  . Annual Exam  . Follow-up    HPI  Well woman: she is due for colon cancer screening ( willing to have Cologuard this time around), mammogram, and cervical cancer screening. She is a widow, but currently dating , same boyfriend for the past 5 years. She does not want to be checked for STI's. She has a history of intraductal papilloma of left  Breast and needs to have mammogram ( seen by Dr. Fleet Contras )  HTN: compliant with medication, but elevated recently, she states since we stopped HCTZ.  Shedenies side effects, no chest pain, palpitation or SOB.Off diureticsbecause of gout. We will increase dose of Coreg. She is following a DASH  Hyperlipidemia: last LDL was at goal, continue high dose statin therapy, denies side effects of medication and is compliant with medications, LDL is at goal at 56 back in Feb 2018. Atherosclerosis aorta, continue statin and aspirin  DMII with microalbuminuria: urine micro was 100 in Dec 2016 and Nov 2017Taking ARB, and aspirin, also Metfomin, she has symptoms of meralgia paresthetica on right side for over 6 months, symptoms improved with Gabapentin but she has been out of medication and would like to resume it Eye exam is up to date. No polyphagia, polydipsia or polyuria, she has nocturia, but she states secondary to hot flashes and needs to drink water during the night to control her body temperature.  Gout: . She is back on Allopurinol now . Doing well at this time. No recent episodes. Last uric acid 6.2  Obesity: she was losing without dietary changes, but now she was  concerned because she continued to lose weight She had lost 13 lbs in the past year,but stable over the past 3 months.   Cough: seen by Dr. 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 She states symptoms when she has an URI. She has not been using Qvar in months now. No wheezing, she still has intermittent episodes of cough.   Patient Active Problem List   Diagnosis Date Noted  . Incomplete rotator cuff tear or rupture of left shoulder, not specified as traumatic 07/26/2016  . Thoracic aortic atherosclerosis ([place]) 01/12/2016  . 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 B12 deficiency 01/11/2016  . Hernia of abdominal cavity 09/15/2014  . Impaired renal function 08/10/2014  . Controlled type 2 diabetes with renal manifestation ([place]) 08/10/2014  . Dyslipidemia 08/10/2014  . Interval gout 08/10/2014  . Microalbuminuria 08/10/2014  . Morbid obesity ([place]) 08/10/2014  . Osteoarthritis of left knee 08/10/2014  . Vitamin D deficiency 08/10/2014  . Cough 01/21/2014  . Intraductal papilloma of breast 06/27/2013  . Hypertension, essential, benign 09/02/2009  . ABNORMAL EKG 09/02/2009    Past Surgical History:  Procedure Laterality Date  . BREAST BIOPSY Left 05/24/2013   neg  . BREAST EXCISIONAL BIOPSY Left 06/21/2013   neg/ dr. Bary Castilla  . BREAST SURGERY Left 2015  . COLONOSCOPY  2009   1 polyp found. Dr. Roselyn Reef  . HERNIA REPAIR  13/09/6576   Ventral/umbilical hernia repair with 10 x 10 cm Atrium mesh in preperitoneal position.  . INSERTION OF MESH N/A 10/09/2014   Procedure: INSERTION OF MESH;  Surgeon: Robert Bellow, MD;  Location: [facility] ORS;  Service:  General;  Laterality: N/A;  . KNEE SURGERY Left 01/14/2009  . STRABISMUS SURGERY     Repair as a child  . TUBAL LIGATION  1990  . VENTRAL HERNIA REPAIR N/A 10/09/2014   Procedure: HERNIA REPAIR VENTRAL ADULT;  Surgeon: Robert Bellow, MD;  Location: [facility] ORS;  Service: General;  Laterality: N/A;    Family History  Problem Relation Age of Onset  . Hypertension Mother   . Diabetes Mother   . Heart attack Father   . Coronary artery disease Father   . Breast cancer Father 40   . Allergies Son        Barista  . Diabetes Sister   . Diabetes Brother   . Gout Brother     Social History   Social History  . Marital status: Widowed    Spouse name: N/A  . Number of children: 3  . Years of education: 12th Grade   Occupational History  .  [employer]   Social History Main Topics  . Smoking status: Former Smoker    Types: Cigarettes    Quit date: 10/02/1983  . Smokeless tobacco: Never Used  . Alcohol use No  . Drug use: No  . Sexual activity: Yes    Partners: Male   Other Topics Concern  . Not on file   Social History Narrative   Widowed.    No regular exercise.     Current Outpatient Prescriptions:  .  allopurinol (ZYLOPRIM) 100 MG tablet, Take 1 tablet (100 mg total) by mouth daily., Disp: 90 tablet, Rfl: 1 .  amLODipine-valsartan (EXFORGE) 10-320 MG tablet, Take 1 tablet by mouth daily., Disp: 90 tablet, Rfl: 1 .  aspirin 81 MG EC tablet, Take 81 mg by mouth daily.  , Disp: , Rfl:  .  atorvastatin (LIPITOR) 40 MG tablet, Take 1 tablet (40 mg total) by mouth daily., Disp: 90 tablet, Rfl: 1 .  carvedilol (COREG) 25 MG tablet, Take 1 tablet (25 mg total) by mouth 2 (two) times daily with a meal., Disp: 180 tablet, Rfl: 1 .  Cyanocobalamin 500 MCG SUBL, Place 1 tablet under the tongue daily., Disp: , Rfl:  .  gabapentin (NEURONTIN) 300 MG capsule, Take 1 capsule (300 mg total) by mouth at bedtime., Disp: 90 capsule, Rfl: 1 .  glucose blood (FREESTYLE LITE) test strip, FREESTYLE LITE TEST (In Vitro Strip)  check fsbs once a day for 0 days  Quantity: 100.00;  Refills: 5   Ordered :31-Dec-2009  Steele Sizer MD;  Dani Gobble Active Comments: DX: 250.00, Disp: , Rfl:  .  LANCETS ULTRA THIN 30G MISC, , Disp: , Rfl:  .  metFORMIN (GLUCOPHAGE-XR) 750 MG 24 hr tablet, Take 1 tablet (750 mg total) by mouth daily with breakfast., Disp: 90 tablet, Rfl: 1  No Known Allergies   ROS  Constitutional: Negative for fever or weight change.   Respiratory: Positive  for mild intermittent  cough , no  shortness of breath.   Cardiovascular: Negative for chest pain or palpitations.  Gastrointestinal: Negative for abdominal pain, no bowel changes.  Musculoskeletal: Negative for gait problem or joint swelling.  Skin: Negative for rash.  Neurological: Negative for dizziness or headache.  No other specific complaints in a complete review of systems (except as listed in HPI above).  Objective  Vitals:   10/26/16 0821  BP: (!) 148/86  Pulse: 78  Resp: 16  Temp: 97.7 F (36.5 C)  TempSrc: Oral  SpO2: 96%  Weight: 208 lb 12.8 oz (94.7 kg)  Height: 5\' 7"  (1.702 m)    Body mass index is 32.7 kg/m.  Physical Exam  Constitutional: Patient appears well-developed and well-nourished. No distress.  HENT: Head: Normocephalic and atraumatic. Ears: B TMs ok, no erythema or effusion; Nose: Nose normal. Mouth/Throat: Oropharynx is clear and moist. No oropharyngeal exudate.  Eyes: Conjunctivae and EOM are normal. Pupils are equal, round, and reactive to light. No scleral icterus.  Neck: Normal range of motion. Neck supple. No JVD present. No thyromegaly present.  Cardiovascular: Normal rate, regular rhythm and normal heart sounds.  No murmur heard. No BLE edema. Pulmonary/Chest: Effort normal and breath sounds normal. No respiratory distress. Abdominal: Soft. Bowel sounds are normal, no distension. There is no tenderness. no masses Breast: no lumps or masses, no nipple discharge or rashes FEMALE GENITALIA:  External genitalia normal External urethra normal Vaginal vault normal without discharge or lesions Cervix normal without discharge , she has a very small polyp at 12 o'clock Bimanual exam normal without masses RECTAL: not done Musculoskeletal: Normal range of motion, no joint effusions. No gross deformities Neurological: he is alert and oriented to person, place, and time. No cranial nerve deficit. Coordination, balance, strength,  speech and gait are normal.  Skin: Skin is warm and dry. No rash noted. No erythema.  Psychiatric: Patient has a normal mood and affect. behavior is normal. Judgment and thought content normal.  Recent Results (from the past 2160 hour(s))  HM DIABETES EYE EXAM     Status: None   Collection Time: 10/12/16 12:00 AM  Result Value Ref Range   HM Diabetic Eye Exam No Retinopathy No Retinopathy    Comment: Glade Nurse, O.D. PA     PHQ2/9: Depression screen [facility] 2/9 10/26/2016 07/26/2016 04/12/2016 01/11/2016 09/07/2015  Decreased Interest 0 0 0 0 0  Down, Depressed, Hopeless 0 0 0 0 0  PHQ - 2 Score 0 0 0 0 0     Fall Risk: Fall Risk  10/26/2016 07/26/2016 04/12/2016 01/11/2016 09/07/2015  Falls in the past year? No No No No No    Functional Status Survey: Is the patient deaf or have difficulty hearing?: No Does the patient have difficulty seeing, even when wearing glasses/contacts?: No Does the patient have difficulty concentrating, remembering, or making decisions?: No Does the patient have difficulty walking or climbing stairs?: No Does the patient have difficulty dressing or bathing?: No Does the patient have difficulty doing errands alone such as visiting a doctor's office or shopping?: No  Current Exercise Habits: The patient has a physically strenous job, but has no regular exercise apart from work.   Assessment & Plan  1. Well woman exam  Discussed importance of 150 minutes of physical activity weekly, eat two servings of fish weekly, eat one serving of tree nuts ( cashews, pistachios, pecans, almonds.Marland Kitchen) every other day, eat 6 servings of fruit/vegetables daily and drink plenty of water and avoid sweet beverages.   2. Controlled type 2 diabetes mellitus with microalbuminuria, without long-term current use of insulin (HCC)  - metFORMIN (GLUCOPHAGE-XR) 750 MG 24 hr tablet; Take 1 tablet (750 mg total) by mouth daily with breakfast.  Dispense: 90 tablet; Refill: 1 - POCT  glycosylated hemoglobin (Hb A1C)  3. Hypertension, essential, benign  bp is still elevated, we will adjust dose of Coreg and monitor - amLODipine-valsartan (EXFORGE) 10-320 MG tablet; Take 1 tablet by mouth daily.  Dispense: 90 tablet; Refill: 1 - carvedilol (COREG) 25 MG tablet;  Take 1 tablet (25 mg total) by mouth 2 (two) times daily with a meal.  Dispense: 180 tablet; Refill: 1  4. Dyslipidemia  - atorvastatin (LIPITOR) 40 MG tablet; Take 1 tablet (40 mg total) by mouth daily.  Dispense: 90 tablet; Refill: 1  5. B12 deficiency  Resume supplementation   6. Vitamin D deficiency  Resume supplementation   7. Morbid obesity, unspecified obesity type ([place])  Discussed life style modification, exercise more , eat less  8. Thoracic aortic atherosclerosis (HCC)  On statin, aspirin bp needs to be better controlled  9. Anemia of chronic disease  We will recheck next visit   10. Breast cancer screening  - MM Digital Screening; Future  11. Needs flu shot  - Flu Vaccine QUAD 36+ mos IM  12. Need for Tdap vaccination  - Tdap vaccine greater than or equal to 7yo IM  13. Screening for cervical cancer  - Pap IG and HPV (high risk) DNA detection  14. Controlled gout  - allopurinol (ZYLOPRIM) 100 MG tablet; Take 1 tablet (100 mg total) by mouth daily.  Dispense: 90 tablet; Refill: 1  15. Meralgia paraesthetica, right  - gabapentin (NEURONTIN) 300 MG capsule; Take 1 capsule (300 mg total) by mouth at bedtime.  Dispense: 90 capsule; Refill: 1  16. Colon cancer screening  - Cologuard

## 2016-10-27 ENCOUNTER — Other Ambulatory Visit: Payer: Self-pay

## 2016-10-27 MED ORDER — AMLODIPINE-OLMESARTAN 10-40 MG PO TABS: 1.0000 | ORAL_TABLET | Freq: Every day | ORAL | 0 refills | Status: DC

## 2016-10-28 LAB — PAP IG AND HPV HIGH-RISK: HPV DNA HIGH RISK: NOT DETECTED

## 2016-10-31 ENCOUNTER — Telehealth: Payer: Self-pay

## 2016-10-31 NOTE — Telephone Encounter (Signed)
Left message for patient to call regarding labs.

## 2016-10-31 NOTE — Telephone Encounter (Signed)
-----   Message from Steele Sizer, MD sent at 10/29/2016  7:50 PM EDT ----- Normal pap smear, HPV negative

## 2016-11-02 ENCOUNTER — Ambulatory Visit: Payer: Commercial Managed Care - PPO | Admitting: Family Medicine

## 2016-12-13 ENCOUNTER — Other Ambulatory Visit: Payer: Self-pay | Admitting: Family Medicine

## 2016-12-13 DIAGNOSIS — I10 Essential (primary) hypertension: Secondary | ICD-10-CM

## 2016-12-13 NOTE — Telephone Encounter (Signed)
Refill request for Hypertension medication: Coreg  Last office visit pertaining to hypertension: 10/26/2016   BP Readings from Last 3 Encounters:  10/26/16 (!) 148/86  07/26/16 (!) 142/86  04/12/16 126/64    Lab Results  Component Value Date   CREATININE 0.91 04/13/2016   BUN 28 (H) 04/13/2016   NA 142 04/13/2016   K 4.1 04/13/2016   CL 106 04/13/2016   CO2 26 04/13/2016   Follow up: 01/25/2017

## 2016-12-14 NOTE — Telephone Encounter (Signed)
Pt was give 90 day supply +1 refill on 10/26/16 - too soon for refill.

## 2017-01-25 ENCOUNTER — Ambulatory Visit: Payer: Commercial Managed Care - PPO | Admitting: Family Medicine

## 2017-02-20 ENCOUNTER — Encounter: Payer: Self-pay | Admitting: Family Medicine

## 2017-02-20 ENCOUNTER — Ambulatory Visit (INDEPENDENT_AMBULATORY_CARE_PROVIDER_SITE_OTHER): Payer: Commercial Managed Care - PPO | Admitting: Family Medicine

## 2017-02-20 VITALS — BP 130/62 | HR 70 | Temp 98.2°F | Resp 16 | Ht 67.0 in | Wt 209.7 lb

## 2017-02-20 DIAGNOSIS — G5711 Meralgia paresthetica, right lower limb: Secondary | ICD-10-CM

## 2017-02-20 DIAGNOSIS — G5601 Carpal tunnel syndrome, right upper limb: Secondary | ICD-10-CM

## 2017-02-20 DIAGNOSIS — I10 Essential (primary) hypertension: Secondary | ICD-10-CM

## 2017-02-20 DIAGNOSIS — E1129 Type 2 diabetes mellitus with other diabetic kidney complication: Secondary | ICD-10-CM

## 2017-02-20 DIAGNOSIS — M109 Gout, unspecified: Secondary | ICD-10-CM | POA: Diagnosis not present

## 2017-02-20 DIAGNOSIS — E785 Hyperlipidemia, unspecified: Secondary | ICD-10-CM | POA: Diagnosis not present

## 2017-02-20 DIAGNOSIS — R809 Proteinuria, unspecified: Secondary | ICD-10-CM | POA: Diagnosis not present

## 2017-02-20 LAB — POCT GLYCOSYLATED HEMOGLOBIN (HGB A1C): HEMOGLOBIN A1C: 6.3

## 2017-02-20 LAB — POCT UA - MICROALBUMIN: Microalbumin Ur, POC: 100 mg/L

## 2017-02-20 MED ORDER — ATORVASTATIN CALCIUM 40 MG PO TABS: 40.0000 mg | ORAL_TABLET | Freq: Every day | ORAL | 1 refills | Status: DC

## 2017-02-20 MED ORDER — METFORMIN HCL ER 750 MG PO TB24: 750.0000 mg | ORAL_TABLET | Freq: Every day | ORAL | 1 refills | Status: DC

## 2017-02-20 MED ORDER — AMLODIPINE-OLMESARTAN 10-40 MG PO TABS: 1.0000 | ORAL_TABLET | Freq: Every day | ORAL | 1 refills | Status: DC

## 2017-02-20 MED ORDER — CARVEDILOL 25 MG PO TABS: 25.0000 mg | ORAL_TABLET | Freq: Two times a day (BID) | ORAL | 1 refills | Status: DC

## 2017-02-20 MED ORDER — ALLOPURINOL 100 MG PO TABS: 100.0000 mg | ORAL_TABLET | Freq: Every day | ORAL | 1 refills | Status: DC

## 2017-02-20 MED ORDER — PREGABALIN 75 MG PO CAPS: 75.0000 mg | ORAL_CAPSULE | Freq: Two times a day (BID) | ORAL | 1 refills | Status: DC

## 2017-02-20 NOTE — Progress Notes (Signed)
Name: Cheryl Barr   MRN: [id number]    DOB: 12/16/1955   Date:02/20/2017       Progress Note  Subjective  Chief Complaint  Chief Complaint  Patient presents with  . Medication Refill  . Diabetes    Checks weekly, BS Average 130. State she is taking Gabapentin but still having burning in her right thigh  . Hypertension    Denies any symptoms  . Hyperlipidemia    HPI  HTN: compliant with medication, Azor, still off HCTZ and bp is at goal today. Shedenies side effects, no chest pain, palpitation or SOB.Off diureticsbecause of gout. We will increase dose of Coreg. She is following a DASH, she has occasional cough, seen by pulmonologist , explained it may be secondary to ARB but if she can tolerate continue medication for kidney protection  Hyperlipidemia: last LDL was at goal, continue high dose statin therapy, denies side effects of medication and is compliant with medications, LDL is at goal at 56 back in Feb 2018. Atherosclerosis aorta, continue statin and aspirin, recheck labs   DMII with microalbuminuria: urine micro was 100 in Dec 2016 and Nov 2017Taking ARB, and aspirin, also Metfomin, she has symptoms of meralgia paresthetica on right side for over 9 months. , symptoms initially improved with gabapentin, but getting worse again, constant burning sensation on right lateral thigh, she has also noticed tingling on right hand and she needs shake it at night.  Eye exam is up to date. No polyphagia, polydipsia or polyuria, she has nocturia, but she states secondary to hot flashes and needs to drink water during the night to control her body temperature.   Gout: . She is back on Allopurinol now . Doing well at this time. No recent episodes. Last uric acid 6.2. We will no longer recheck level   Obesity: she was losing without dietary changes, but now she was concerned because she continued to lose weight She hadlost 13 lbs in the previous year, but has gained 7 lbs since last Feb    Cough: seen by Dr. 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 She states symptoms when she has an URI. She has not been using Qvar in months now. No wheezing, she still has intermittent episodes of cough.   Patient Active Problem List   Diagnosis Date Noted  . Incomplete rotator cuff tear or rupture of left shoulder, not specified as traumatic 07/26/2016  . Thoracic aortic atherosclerosis ([place]) 01/12/2016  . 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 B12 deficiency 01/11/2016  . Hernia of abdominal cavity 09/15/2014  . Impaired renal function 08/10/2014  . Controlled type 2 diabetes with renal manifestation ([place]) 08/10/2014  . Dyslipidemia 08/10/2014  . Interval gout 08/10/2014  . Microalbuminuria 08/10/2014  . Morbid obesity ([place]) 08/10/2014  . Osteoarthritis of left knee 08/10/2014  . Vitamin D deficiency 08/10/2014  . Cough 01/21/2014  . Intraductal papilloma of breast 06/27/2013  . Hypertension, essential, benign 09/02/2009  . ABNORMAL EKG 09/02/2009    Past Surgical History:  Procedure Laterality Date  . BREAST BIOPSY Left 05/24/2013   neg  . BREAST EXCISIONAL BIOPSY Left 06/21/2013   neg/ dr. Bary Castilla  . BREAST SURGERY Left 2015  . COLONOSCOPY  2009   1 polyp found. Dr. Roselyn Reef  . HERNIA REPAIR  96/28/3662   Ventral/umbilical hernia repair with 10 x 10 cm Atrium mesh in preperitoneal position.  . INSERTION OF MESH N/A 10/09/2014  Procedure: INSERTION OF MESH;  Surgeon: Robert Bellow, MD;  Location: [facility] ORS;  Service: General;  Laterality: N/A;  . KNEE SURGERY Left 01/14/2009  . STRABISMUS SURGERY     Repair as a child  . TUBAL LIGATION  1990  . VENTRAL HERNIA REPAIR N/A 10/09/2014   Procedure: HERNIA REPAIR VENTRAL ADULT;  Surgeon: Robert Bellow, MD;  Location: [facility] ORS;  Service: General;  Laterality: N/A;    Family History  Problem Relation Age of Onset  . Hypertension Mother   . Diabetes  Mother   . Heart attack Father   . Coronary artery disease Father   . Breast cancer Father 70  . Allergies Son        Barista  . Diabetes Sister   . Diabetes Brother   . Gout Brother     Social History   Socioeconomic History  . Marital status: Widowed    Spouse name: Not on file  . Number of children: 3  . Years of education: 12th Grade  . Highest education level: Not on file  Social Needs  . Financial resource strain: Not on file  . Food insecurity - worry: Not on file  . Food insecurity - inability: Not on file  . Transportation needs - medical: Not on file  . Transportation needs - non-medical: Not on file  Occupational History    Employer: [employer]  Tobacco Use  . Smoking status: Former Smoker    Types: Cigarettes    Last attempt to quit: 10/02/1983    Years since quitting: 33.4  . Smokeless tobacco: Never Used  Substance and Sexual Activity  . Alcohol use: No    Alcohol/week: 0.0 oz  . Drug use: No  . Sexual activity: Yes    Partners: Male  Other Topics Concern  . Not on file  Social History Narrative   Widowed.    No regular exercise.     Current Outpatient Medications:  .  allopurinol (ZYLOPRIM) 100 MG tablet, Take 1 tablet (100 mg total) by mouth daily., Disp: 90 tablet, Rfl: 1 .  amLODipine-olmesartan (AZOR) 10-40 MG tablet, Take 1 tablet by mouth daily., Disp: 90 tablet, Rfl: 1 .  aspirin 81 MG EC tablet, Take 81 mg by mouth daily.  , Disp: , Rfl:  .  atorvastatin (LIPITOR) 40 MG tablet, Take 1 tablet (40 mg total) by mouth daily., Disp: 90 tablet, Rfl: 1 .  carvedilol (COREG) 25 MG tablet, Take 1 tablet (25 mg total) by mouth 2 (two) times daily with a meal., Disp: 180 tablet, Rfl: 1 .  Cyanocobalamin 500 MCG SUBL, Place 1 tablet under the tongue daily., Disp: , Rfl:  .  glucose blood (FREESTYLE LITE) test strip, FREESTYLE LITE TEST (In Vitro Strip)  check fsbs once a day for 0 days  Quantity: 100.00;  Refills: 5   Ordered :31-Dec-2009   Steele Sizer MD;  Dani Gobble Active Comments: DX: 250.00, Disp: , Rfl:  .  LANCETS ULTRA THIN 30G MISC, , Disp: , Rfl:  .  metFORMIN (GLUCOPHAGE-XR) 750 MG 24 hr tablet, Take 1 tablet (750 mg total) by mouth daily with breakfast., Disp: 90 tablet, Rfl: 1 .  pregabalin (LYRICA) 75 MG capsule, Take 1 capsule (75 mg total) by mouth 2 (two) times daily., Disp: 180 capsule, Rfl: 1  No Known Allergies   ROS  Constitutional: Negative for fever or weight change.  Respiratory: Positive  for intermittent cough but no  shortness of  breath.   Cardiovascular: Negative for chest pain or palpitations.  Gastrointestinal: Negative for abdominal pain, no bowel changes.  Musculoskeletal: Negative for gait problem or joint swelling.  Skin: Negative for rash.  Neurological: Negative for dizziness or headache.  No other specific complaints in a complete review of systems (except as listed in HPI above).  Objective  Vitals:   02/20/17 1201  BP: 130/62  Pulse: 70  Resp: 16  Temp: 98.2 F (36.8 C)  TempSrc: Oral  SpO2: 96%  Weight: 209 lb 11.2 oz (95.1 kg)  Height: 5\' 7"  (1.702 m)    Body mass index is 32.84 kg/m.  Physical Exam  Constitutional: Patient appears well-developed and well-nourished. Obese  No distress.  HEENT: head atraumatic, normocephalic, pupils equal and reactive to light,neck supple, throat within normal limits Cardiovascular: Normal rate, regular rhythm and normal heart sounds.  No murmur heard. No BLE edema. Pulmonary/Chest: Effort normal and breath sounds normal. No respiratory distress. Abdominal: Soft.  There is no tenderness. Psychiatric: Patient has a normal mood and affect. behavior is normal. Judgment and thought content normal.  Recent Results (from the past 2160 hour(s))  POCT HgB A1C     Status: Normal   Collection Time: 02/20/17 12:04 PM  Result Value Ref Range   Hemoglobin A1C 6.3   POCT UA - Microalbumin     Status: Abnormal   Collection Time:  02/20/17 12:04 PM  Result Value Ref Range   Microalbumin Ur, POC 100 mg/L   Creatinine, POC  mg/dL   Albumin/Creatinine Ratio, Urine, POC       PHQ2/9: Depression screen [facility] 2/9 02/20/2017 10/26/2016 07/26/2016 04/12/2016 01/11/2016  Decreased Interest 0 0 0 0 0  Down, Depressed, Hopeless 0 0 0 0 0  PHQ - 2 Score 0 0 0 0 0     Fall Risk: Fall Risk  02/20/2017 10/26/2016 07/26/2016 04/12/2016 01/11/2016  Falls in the past year? No No No No No     Functional Status Survey: Is the patient deaf or have difficulty hearing?: No Does the patient have difficulty seeing, even when wearing glasses/contacts?: No Does the patient have difficulty concentrating, remembering, or making decisions?: No Does the patient have difficulty walking or climbing stairs?: No Does the patient have difficulty dressing or bathing?: No Does the patient have difficulty doing errands alone such as visiting a doctor's office or shopping?: No    Assessment & Plan  1. Controlled type 2 diabetes mellitus with microalbuminuria, without long-term current use of insulin (HCC)  - POCT HgB A1C - POCT UA - Microalbumin - metFORMIN (GLUCOPHAGE-XR) 750 MG 24 hr tablet; Take 1 tablet (750 mg total) by mouth daily with breakfast.  Dispense: 90 tablet; Refill: 1  2. Controlled gout  - allopurinol (ZYLOPRIM) 100 MG tablet; Take 1 tablet (100 mg total) by mouth daily.  Dispense: 90 tablet; Refill: 1  3. Dyslipidemia  - atorvastatin (LIPITOR) 40 MG tablet; Take 1 tablet (40 mg total) by mouth daily.  Dispense: 90 tablet; Refill: 1 - Lipid panel  4. Hypertension, essential, benign  - amLODipine-olmesartan (AZOR) 10-40 MG tablet; Take 1 tablet by mouth daily.  Dispense: 90 tablet; Refill: 1 - carvedilol (COREG) 25 MG tablet; Take 1 tablet (25 mg total) by mouth 2 (two) times daily with a meal.  Dispense: 180 tablet; Refill: 1 - CBC with Differential/Platelet - COMPLETE METABOLIC PANEL WITH GFR  5. Meralgia paraesthetica,  right  - pregabalin (LYRICA) 75 MG capsule; Take 1 capsule (75 mg total)  by mouth 2 (two) times daily.  Dispense: 180 capsule; Refill: 1  6. Morbid obesity, unspecified obesity type ([place])  She wants to gain weight, but advise to keep up the good work  7. Carpal tunnel syndrome of right wrist  We will try Lyrica she does not want surgery at this time

## 2017-02-22 LAB — COMPLETE METABOLIC PANEL WITH GFR
AG Ratio: 1.1 (calc) (ref 1.0–2.5)
ALBUMIN MSPROF: 4 g/dL (ref 3.6–5.1)
ALKALINE PHOSPHATASE (APISO): 79 U/L (ref 33–130)
ALT: 12 U/L (ref 6–29)
AST: 12 U/L (ref 10–35)
BILIRUBIN TOTAL: 0.3 mg/dL (ref 0.2–1.2)
BUN: 21 mg/dL (ref 7–25)
CHLORIDE: 105 mmol/L (ref 98–110)
CO2: 30 mmol/L (ref 20–32)
CREATININE: 0.98 mg/dL (ref 0.50–0.99)
Calcium: 9.7 mg/dL (ref 8.6–10.4)
GFR, Est African American: 72 mL/min/{1.73_m2} (ref >=60)
GFR, Est Non African American: 62 mL/min/{1.73_m2} (ref >=60)
GLUCOSE: 118 mg/dL — AB (ref 65–99)
Globulin: 3.5 g/dL (calc) (ref 1.9–3.7)
Potassium: 4.2 mmol/L (ref 3.5–5.3)
Sodium: 143 mmol/L (ref 135–146)
TOTAL PROTEIN: 7.5 g/dL (ref 6.1–8.1)

## 2017-02-22 LAB — CBC WITH DIFFERENTIAL/PLATELET
BASOS PCT: 0.5 %
Basophils Absolute: 30 cells/uL (ref 0–200)
EOS ABS: 108 {cells}/uL (ref 15–500)
EOS PCT: 1.8 %
HCT: 34.2 % — ABNORMAL LOW (ref 35.0–45.0)
HEMOGLOBIN: 11.2 g/dL — AB (ref 11.7–15.5)
Lymphs Abs: 2622 cells/uL (ref 850–3900)
MCH: 25.9 pg — AB (ref 27.0–33.0)
MCHC: 32.7 g/dL (ref 32.0–36.0)
MCV: 79.2 fL — ABNORMAL LOW (ref 80.0–100.0)
MONOS PCT: 5.5 %
MPV: 9.5 fL (ref 7.5–12.5)
NEUTROS ABS: 2910 {cells}/uL (ref 1500–7800)
Neutrophils Relative %: 48.5 %
PLATELETS: 346 10*3/uL (ref 140–400)
RBC: 4.32 10*6/uL (ref 3.80–5.10)
RDW: 14.7 % (ref 11.0–15.0)
TOTAL LYMPHOCYTE: 43.7 %
WBC mixed population: 330 cells/uL (ref 200–950)
WBC: 6 10*3/uL (ref 3.8–10.8)

## 2017-02-22 LAB — LIPID PANEL
CHOL/HDL RATIO: 2.9 (calc) (ref <5.0)
CHOLESTEROL: 140 mg/dL (ref <200)
HDL: 48 mg/dL — ABNORMAL LOW (ref >50)
LDL CHOLESTEROL (CALC): 68 mg/dL
Non-HDL Cholesterol (Calc): 92 mg/dL (calc) (ref <130)
TRIGLYCERIDES: 153 mg/dL — AB (ref <150)

## 2017-02-22 LAB — HM COLONOSCOPY

## 2017-03-06 LAB — COLOGUARD: Cologuard: NEGATIVE

## 2017-03-07 ENCOUNTER — Encounter: Payer: Self-pay | Admitting: Family Medicine

## 2017-04-06 ENCOUNTER — Encounter: Payer: Self-pay | Admitting: Family Medicine

## 2017-04-06 ENCOUNTER — Ambulatory Visit: Payer: Commercial Managed Care - PPO | Admitting: Family Medicine

## 2017-04-06 ENCOUNTER — Ambulatory Visit
Admission: RE | Admit: 2017-04-06 | Discharge: 2017-04-06 | Disposition: A | Discharge status: Home / Self Care | Payer: Commercial Managed Care - PPO | Source: Ambulatory Visit | Attending: Family Medicine | Admitting: Family Medicine

## 2017-04-06 VITALS — BP 130/72 | HR 76 | Temp 98.9°F | Resp 16 | Ht 67.0 in | Wt 211.2 lb

## 2017-04-06 DIAGNOSIS — R05 Cough: Secondary | ICD-10-CM | POA: Diagnosis not present

## 2017-04-06 DIAGNOSIS — E1129 Type 2 diabetes mellitus with other diabetic kidney complication: Secondary | ICD-10-CM | POA: Diagnosis not present

## 2017-04-06 DIAGNOSIS — R809 Proteinuria, unspecified: Secondary | ICD-10-CM | POA: Diagnosis not present

## 2017-04-06 DIAGNOSIS — R062 Wheezing: Secondary | ICD-10-CM | POA: Diagnosis not present

## 2017-04-06 DIAGNOSIS — R059 Cough, unspecified: Secondary | ICD-10-CM

## 2017-04-06 DIAGNOSIS — R0981 Nasal congestion: Secondary | ICD-10-CM | POA: Diagnosis not present

## 2017-04-06 MED ORDER — BUDESONIDE-FORMOTEROL FUMARATE 80-4.5 MCG/ACT IN AERO: 2.0000 | INHALATION_SPRAY | Freq: Two times a day (BID) | RESPIRATORY_TRACT | 3 refills | Status: DC

## 2017-04-06 MED ORDER — FLUTICASONE PROPIONATE 50 MCG/ACT NA SUSP: 2.0000 | Freq: Every day | NASAL | 6 refills | Status: DC

## 2017-04-06 MED ORDER — BENZONATATE 100 MG PO CAPS: 100.0000 mg | ORAL_CAPSULE | Freq: Three times a day (TID) | ORAL | 0 refills | Status: DC | PRN

## 2017-04-06 NOTE — Patient Instructions (Addendum)
Cough, Adult A cough helps to clear your throat and lungs. A cough may last only 2-3 weeks (acute), or it may last longer than 8 weeks (chronic). Many different things can cause a cough. A cough may be a sign of an illness or another medical condition. Follow these instructions at home:  Pay attention to any changes in your cough.  Take medicines only as told by your doctor. ? If you were prescribed an antibiotic medicine, take it as told by your doctor. Do not stop taking it even if you start to feel better. ? Talk with your doctor before you try using a cough medicine.  Drink enough fluid to keep your pee (urine) clear or pale yellow.  If the air is dry, use a cold steam vaporizer or humidifier in your home.  Stay away from things that make you cough at work or at home.  If your cough is worse at night, try using extra pillows to raise your head up higher while you sleep.  Do not smoke, and try not to be around smoke. If you need help quitting, ask your doctor.  Do not have caffeine.  Do not drink alcohol.  Rest as needed. Contact a doctor if:  You have new problems (symptoms).  You cough up yellow fluid (pus).  Your cough does not get better after 2-3 weeks, or your cough gets worse.  Medicine does not help your cough and you are not sleeping well.  You have pain that gets worse or pain that is not helped with medicine.  You have a fever.  You are losing weight and you do not know why.  You have night sweats. Get help right away if:  You cough up blood.  You have trouble breathing.  Your heartbeat is very fast. This information is not intended to replace advice given to you by your health care provider. Make sure you discuss any questions you have with your health care provider. Document Released: 10/14/2010 Document Revised: 07/09/2015 Document Reviewed: 04/09/2014 Elsevier Interactive Patient Education  2018 [employer].  Diabetes Mellitus and Sick Day  Management Blood sugar (glucose) can be difficult to control when you are sick. Common illnesses that can cause problems for people with diabetes (diabetes mellitus) include colds, fever, flu (influenza), nausea, vomiting, and diarrhea. These illnesses can cause stress and loss of body fluids (dehydration), and those issues can cause blood glucose levels to increase. Because of this, it is very important to take your insulin and diabetes medicines and eat some form of carbohydrate when you are sick. You should make a plan for days when you are sick (sick day plan) as part of your diabetes management plan. You and your health care provider should make this plan in advance. The following guidelines are intended to help you manage an illness that lasts for about 24 hours or less. Your health care provider may also give you more specific instructions. What do I need to do to manage my blood glucose?  Check your blood glucose every 2-4 hours, or as often as told by your health care provider.  Know your sick day treatment goals. Your target blood glucose levels may be different when you are sick.  If you use insulin, take your usual dose. ? If your blood glucose continues to be too high, you may need to take an additional insulin dose as told by your health care provider.  If you use oral diabetes medicine, you may need to stop taking  it if you are not able to eat or drink normally. Ask your health care provider about whether you need to stop taking these medicines while you are sick.  If you use injectable hormone medicines other than insulin to control your diabetes, ask your health care provider about whether you need to stop taking these medicines while you are sick. What else can I do to manage my diabetes when I am sick? Check your ketones  If you have type 1 diabetes, check your urine ketones every 4 hours.  If you have type 2 diabetes, check your urine ketones as often as told by your health  care provider. Drink fluids  Drink enough fluid to keep your urine clear or pale yellow. This is especially important if you have a fever, vomiting, or diarrhea. Those symptoms can lead to dehydration.  Follow any instructions from your health care provider about beverages to avoid. ? Do not drink alcohol, caffeine, or drinks that contain a lot of sugar. Take medicines as directed  Take-over-the-counter and prescription medicines only as told by your health care provider.  Check medicine labels for added sugars. Some medicines may contain sugar or types of sugars that can raise your blood glucose level. What foods can I eat when I am sick? You need to eat some form of carbohydrates when you are sick. You should eat 45-50 grams (45-50 g) of carbohydrates every 3-4 hours until you feel better. All of the food choices below contain about 15 g of carbohydrates. Plan ahead and keep some of these foods around so you have them if you get sick.  4-6 oz (120-177 mL) carbonated beverage that contains sugar, such as regular (not diet) soda. You may be able to drink carbonated beverages more easily if you open the beverage and let it sit at room temperature for a few minutes before drinking.   of a twin frozen ice pop.  4 oz (120 g) regular gelatin.  4 oz (120 mL) fruit juice.  4 oz (120 g) ice cream or frozen yogurt.  2 oz (60 g) sherbet.  8 oz (240 mL) clear broth or soup.  4 oz (120 g) regular custard.  4 oz (120 g) regular pudding.  8 oz (240 g) plain yogurt.  1 slice bread or toast.  6 saltine crackers.  5 vanilla wafers.  Questions to ask your health care provider Consider asking the following questions so you know what to do on days when you are sick:  Should I adjust my diabetes medicines?  How often do I need to check my blood glucose?  What supplies do I need to manage my diabetes at home when I am sick?  What number can I call if I have questions?  What foods and  drinks should I avoid?  Contact a health care provider if:  You develop symptoms of diabetic ketoacidosis, such as: ? Fatigue. ? Weight loss. ? Excessive thirst. ? Light-headedness. ? Fruity or sweet-smelling breath. ? Excessive urination. ? Vision changes. ? Confusion or irritability. ? Nausea. ? Vomiting. ? Rapid breathing. ? Pain in the abdomen. ? Feeling flushed.  You are unable to drink fluids without vomiting.  You have any of the following for more than 6 hours: ? Nausea. ? Vomiting. ? Diarrhea.  Your blood glucose is at or above 240 mg/dL (13.3 mmol/L), even after you take an additional insulin dose.  You have a change in how you think, feel, or act (mental status).  You  develop another serious illness.  You have been sick or have had a fever for 2 days or longer and you are not getting better. Get help right away if:  Your blood glucose is lower than 54 mg/dL (3.0 mmol/L).  You have difficulty breathing.  You have moderate or high ketone levels in your urine.  You used emergency glucagon to treat low blood glucose. Summary  Blood sugar (glucose) can be difficult to control when you are sick. Common illnesses that can cause problems for people with diabetes (diabetes mellitus) include colds, fever, flu (influenza), nausea, vomiting, and diarrhea.  Illnesses can cause stress and loss of body fluids (dehydration), and those issues can cause blood glucose levels to increase.  Make a plan for days when you are sick (sick day plan) as part of your diabetes management plan. You and your health care provider should make this plan in advance.  It is very important to take your insulin and diabetes medicines and to eat some form of carbohydrate when you are sick.  Contact your health care provider if have problems managing your blood glucose levels when you are sick, or if you have been sick or had a fever for 2 days or longer and are not getting better. This  information is not intended to replace advice given to you by your health care provider. Make sure you discuss any questions you have with your health care provider. Document Released: 02/03/2003 Document Revised: 10/30/2015 Document Reviewed: 10/30/2015 Elsevier Interactive Patient Education  [employer].

## 2017-04-06 NOTE — Progress Notes (Signed)
Name: Cheryl Barr   MRN: [id number]    DOB: 10-Jul-1955   Date:04/06/2017       Progress Note  Subjective  Chief Complaint  Chief Complaint  Patient presents with  . Cough    for 5 days, worse at night    HPI  PT presents with concern for URI ongoing for 5 days - dry raspy cough much worse at night, nasal congestion, sore throat, chest discomfort only when coughing.  Denies fevers/chills, shortness of breath, NVD.  She has tried Alka-zeltzer plus, mucinex, and nyquil without relief.  Recent sick contacts - she works at [employer], and has had many residents who have been sick recently.  She is diabetic but does not check BG's at home - Last A1C on 02/20/17 was 6.3%.  Patient Active Problem List   Diagnosis Date Noted  . Incomplete rotator cuff tear or rupture of left shoulder, not specified as traumatic 07/26/2016  . Thoracic aortic atherosclerosis ([place]) 01/12/2016  . 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 B12 deficiency 01/11/2016  . Hernia of abdominal cavity 09/15/2014  . Impaired renal function 08/10/2014  . Controlled type 2 diabetes with renal manifestation ([place]) 08/10/2014  . Dyslipidemia 08/10/2014  . Interval gout 08/10/2014  . Microalbuminuria 08/10/2014  . Morbid obesity ([place]) 08/10/2014  . Osteoarthritis of left knee 08/10/2014  . Vitamin D deficiency 08/10/2014  . Cough 01/21/2014  . Intraductal papilloma of breast 06/27/2013  . Hypertension, essential, benign 09/02/2009  . ABNORMAL EKG 09/02/2009    Social History   Tobacco Use  . Smoking status: Former Smoker    Types: Cigarettes    Last attempt to quit: 10/02/1983    Years since quitting: 33.5  . Smokeless tobacco: Never Used  Substance Use Topics  . Alcohol use: No    Alcohol/week: 0.0 oz     Current Outpatient Medications:  .  allopurinol (ZYLOPRIM) 100 MG tablet, Take 1 tablet (100 mg total) by mouth daily., Disp: 90 tablet, Rfl: 1 .  amLODipine-olmesartan (AZOR) 10-40  MG tablet, Take 1 tablet by mouth daily., Disp: 90 tablet, Rfl: 1 .  aspirin 81 MG EC tablet, Take 81 mg by mouth daily.  , Disp: , Rfl:  .  atorvastatin (LIPITOR) 40 MG tablet, Take 1 tablet (40 mg total) by mouth daily., Disp: 90 tablet, Rfl: 1 .  carvedilol (COREG) 25 MG tablet, Take 1 tablet (25 mg total) by mouth 2 (two) times daily with a meal., Disp: 180 tablet, Rfl: 1 .  Cyanocobalamin 500 MCG SUBL, Place 1 tablet under the tongue daily., Disp: , Rfl:  .  glucose blood (FREESTYLE LITE) test strip, FREESTYLE LITE TEST (In Vitro Strip)  check fsbs once a day for 0 days  Quantity: 100.00;  Refills: 5   Ordered :31-Dec-2009  Steele Sizer MD;  Dani Gobble Active Comments: DX: 250.00, Disp: , Rfl:  .  LANCETS ULTRA THIN 30G MISC, , Disp: , Rfl:  .  metFORMIN (GLUCOPHAGE-XR) 750 MG 24 hr tablet, Take 1 tablet (750 mg total) by mouth daily with breakfast., Disp: 90 tablet, Rfl: 1 .  pregabalin (LYRICA) 75 MG capsule, Take 1 capsule (75 mg total) by mouth 2 (two) times daily., Disp: 180 capsule, Rfl: 1  No Known Allergies  ROS  Ten systems reviewed and is negative except as mentioned in HPI  Objective  Vitals:   04/06/17 1457  BP: 130/72  Pulse: 76  Resp: 16  Temp: 98.9 F (37.2 C)  TempSrc:  Oral  SpO2: 95%  Weight: 211 lb 3.2 oz (95.8 kg)  Height: 5\' 7"  (1.702 m)   Body mass index is 33.08 kg/m.  Nursing Note and Vital Signs reviewed.  Physical Exam  Constitutional: Patient appears well-developed and well-nourished. Obese. No distress.  HEENT: head atraumatic, normocephalic, pupils equal and reactive to light, EOM's intact, TM's without erythema or bulging, no maxillary or frontal sinus tenderness, turbinates inflamed, neck supple without lymphadenopathy, oropharynx mildly erythematous and moist without exudate Cardiovascular: Normal rate, regular rhythm, S1/S2 present.  No murmur or rub heard. No BLE edema. Pulmonary/Chest: Effort normal and breath sounds - LUL  expiratory wheeze, LLL is diminished. No respiratory distress or retractions. Psychiatric: Patient has a normal mood and affect. behavior is normal. Judgment and thought content normal.  No results found for this or any previous visit (from the past 72 hour(s)).  Assessment & Plan  1. Cough - benzonatate (TESSALON) 100 MG capsule; Take 1-2 capsules (100-200 mg total) by mouth 3 (three) times daily as needed for cough.  Dispense: 30 capsule; Refill: 0 - DG Chest 2 View; Future  2. Wheezing - budesonide-formoterol (SYMBICORT) 80-4.5 MCG/ACT inhaler; Inhale 2 puffs into the lungs 2 (two) times daily.  Dispense: 1 Inhaler; Refill: 3 - DG Chest 2 View; Future  3. Nasal congestion - fluticasone (FLONASE) 50 MCG/ACT nasal spray; Place 2 sprays into both nostrils daily.  Dispense: 16 g; Refill: 6  4. Controlled type 2 diabetes mellitus with microalbuminuria, without long-term current use of insulin (HCC) - Discussed increased risk of infection from DM- her DM has been well controlled. Discussed sick day care in detail and what to do if hyper/hypoglycemia occurs.  We will avoid systemic corticosteroids for the time being and use inhaler steroid only.  Return if symptoms worsen or fail to improve, for 4-5 days if not improving.  - Work note for tomorrow is provided.  -Red flags and when to present for emergency care or RTC including fever >101.86F, chest pain, shortness of breath, new/worsening/un-resolving symptoms, reviewed with patient at time of visit. Follow up and care instructions discussed and provided in AVS.

## 2017-04-07 ENCOUNTER — Encounter: Payer: Self-pay | Admitting: Emergency Medicine

## 2017-05-22 ENCOUNTER — Ambulatory Visit (INDEPENDENT_AMBULATORY_CARE_PROVIDER_SITE_OTHER): Payer: Commercial Managed Care - PPO | Admitting: Family Medicine

## 2017-05-22 ENCOUNTER — Encounter: Payer: Self-pay | Admitting: Family Medicine

## 2017-05-22 VITALS — BP 118/66 | HR 81 | Temp 98.4°F | Resp 16 | Ht 67.0 in | Wt 211.7 lb

## 2017-05-22 DIAGNOSIS — G5711 Meralgia paresthetica, right lower limb: Secondary | ICD-10-CM | POA: Diagnosis not present

## 2017-05-22 DIAGNOSIS — I700 Atherosclerosis of aorta: Secondary | ICD-10-CM | POA: Diagnosis not present

## 2017-05-22 DIAGNOSIS — E785 Hyperlipidemia, unspecified: Secondary | ICD-10-CM | POA: Diagnosis not present

## 2017-05-22 DIAGNOSIS — I10 Essential (primary) hypertension: Secondary | ICD-10-CM

## 2017-05-22 DIAGNOSIS — E1129 Type 2 diabetes mellitus with other diabetic kidney complication: Secondary | ICD-10-CM | POA: Diagnosis not present

## 2017-05-22 DIAGNOSIS — R809 Proteinuria, unspecified: Secondary | ICD-10-CM

## 2017-05-22 DIAGNOSIS — M109 Gout, unspecified: Secondary | ICD-10-CM

## 2017-05-22 DIAGNOSIS — D638 Anemia in other chronic diseases classified elsewhere: Secondary | ICD-10-CM

## 2017-05-22 LAB — POCT GLYCOSYLATED HEMOGLOBIN (HGB A1C): Hemoglobin A1C: 6.4

## 2017-05-22 MED ORDER — PREGABALIN 75 MG PO CAPS: 75.0000 mg | ORAL_CAPSULE | Freq: Two times a day (BID) | ORAL | 1 refills | Status: DC

## 2017-05-22 NOTE — Progress Notes (Signed)
Name: Cheryl Barr   MRN: [id number]    DOB: 1955-08-21   Date:05/22/2017       Progress Note  Subjective  Chief Complaint  Chief Complaint  Patient presents with  . Medication Refill    States Lyrica was not available at the pharmacy and states she started back on Gapabentin she had left at home 300 mg nightly.  . Diabetes    Checks periodically but has not checked in 2 weeks due to her meter needs a battery.  . Hyperlipidemia  . Hypertension    Denies any symptoms    HPI  HTN: compliant with medication, Azor, still off HCTZ and bp is at goal today. Shedenies side effects, no chest pain, palpitation or SOB.Off diureticsbecause of gout. Also on Coreg, but we will decrease dose if bp remains low, she does not want to change at this time. She is following a DASH, she has occasional cough, seen by pulmonologist , explained it may be secondary to ARB but because of microalbuminuria we will continue ARB  Hyperlipidemia: last LDL was at goal, continue high dose statin therapy, denies side effects of medication and is compliant with medications, LDL is at goal01/2019. Marland Kitchen Atherosclerosis aorta, continue statin and aspirin   DMII with microalbuminuria: urine micro was 100 in Dec 2016 and Nov 2017 and 02/2017 Taking ARB, and aspirin, also Metfomin, she has symptoms of meralgia paresthetica on right side for over one year, we sent Lyrica last visit but it was not at pharmacy, she is wiling to try it this time, symptoms initially improved with gabapentin, but getting worse again, constant burning sensation on right lateral thigh, she has also noticed tingling on right hand and she needs shake it at night. Eye exam is up to date. No polyphagia, polydipsia or polyuria, she has nocturia, but she states secondary to hot flashes and needs to drink water during the night to control her body temperature. No changes of symptoms. HgbA1C has been at goal, today it was 6.4%  Gout: . She is back on  Allopurinol now . Doing well at this time. No recent episodes. Last uric acid 6.2. Unchanged  Obesity: she was losing without dietary changes, but now she was concerned because she continued to lose weight She hadlost 13 lbs in the previous year, but is gaining now. She is not interested in losing anymore, discussed portion size and healthy diet   Cough: seen by Dr. 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 She states symptoms are worse when she has an URI. Marland Kitchen No wheezing,she still has intermittent episodes of cough.No longer daily.    Patient Active Problem List   Diagnosis Date Noted  . Incomplete rotator cuff tear or rupture of left shoulder, not specified as traumatic 07/26/2016  . Thoracic aortic atherosclerosis ([place]) 01/12/2016  . 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 B12 deficiency 01/11/2016  . Hernia of abdominal cavity 09/15/2014  . Impaired renal function 08/10/2014  . Controlled type 2 diabetes with renal manifestation ([place]) 08/10/2014  . Dyslipidemia 08/10/2014  . Interval gout 08/10/2014  . Microalbuminuria 08/10/2014  . Morbid obesity ([employer]) 08/10/2014  . Osteoarthritis of left knee 08/10/2014  . Vitamin D deficiency 08/10/2014  . Cough 01/21/2014  . Intraductal papilloma of breast 06/27/2013  . Hypertension, essential, benign 09/02/2009  . ABNORMAL EKG 09/02/2009    Past Surgical History:  Procedure Laterality Date  . BREAST BIOPSY Left 05/24/2013  neg  . BREAST EXCISIONAL BIOPSY Left 06/21/2013   neg/ dr. Bary Castilla  . BREAST SURGERY Left 2015  . COLONOSCOPY  2009   1 polyp found. Dr. Roselyn Reef  . HERNIA REPAIR  [id number]/[id number]   Ventral/umbilical hernia repair with 10 x 10 cm Atrium mesh in preperitoneal position.  . INSERTION OF MESH N/A 10/09/2014   Procedure: INSERTION OF MESH;  Surgeon: Robert Bellow, MD;  Location: [facility] ORS;  Service: General;  Laterality: N/A;  . KNEE SURGERY Left  01/14/2009  . STRABISMUS SURGERY     Repair as a child  . TUBAL LIGATION  1990  . VENTRAL HERNIA REPAIR N/A 10/09/2014   Procedure: HERNIA REPAIR VENTRAL ADULT;  Surgeon: Robert Bellow, MD;  Location: [facility] ORS;  Service: General;  Laterality: N/A;    Family History  Problem Relation Age of Onset  . Hypertension Mother   . Diabetes Mother   . Heart attack Father   . Coronary artery disease Father   . Breast cancer Father 39  . Allergies Son        Barista  . Diabetes Sister   . Diabetes Brother   . Gout Brother     Social History   Socioeconomic History  . Marital status: Widowed    Spouse name: Not on file  . Number of children: 3  . Years of education: 12th Grade  . Highest education level: Not on file  Occupational History    Employer: [employer]  Social Needs  . Financial resource strain: Not on file  . Food insecurity:    Worry: Not on file    Inability: Not on file  . Transportation needs:    Medical: Not on file    Non-medical: Not on file  Tobacco Use  . Smoking status: Former Smoker    Types: Cigarettes    Last attempt to quit: 10/02/1983    Years since quitting: 33.6  . Smokeless tobacco: Never Used  Substance and Sexual Activity  . Alcohol use: No    Alcohol/week: 0.0 oz  . Drug use: No  . Sexual activity: Yes    Partners: Male  Lifestyle  . Physical activity:    Days per week: Not on file    Minutes per session: Not on file  . Stress: Not on file  Relationships  . Social connections:    Talks on phone: Not on file    Gets together: Not on file    Attends religious service: Not on file    Active member of club or organization: Not on file    Attends meetings of clubs or organizations: Not on file    Relationship status: Not on file  . Intimate partner violence:    Fear of current or ex partner: Not on file    Emotionally abused: Not on file    Physically abused: Not on file    Forced sexual activity: Not on file  Other  Topics Concern  . Not on file  Social History Narrative   Widowed.    No regular exercise.     Current Outpatient Medications:  .  allopurinol (ZYLOPRIM) 100 MG tablet, Take 1 tablet (100 mg total) by mouth daily., Disp: 90 tablet, Rfl: 1 .  amLODipine-olmesartan (AZOR) 10-40 MG tablet, Take 1 tablet by mouth daily., Disp: 90 tablet, Rfl: 1 .  aspirin 81 MG EC tablet, Take 81 mg by mouth daily.  , Disp: , Rfl:  .  atorvastatin (LIPITOR)  40 MG tablet, Take 1 tablet (40 mg total) by mouth daily., Disp: 90 tablet, Rfl: 1 .  carvedilol (COREG) 25 MG tablet, Take 1 tablet (25 mg total) by mouth 2 (two) times daily with a meal., Disp: 180 tablet, Rfl: 1 .  glucose blood (FREESTYLE LITE) test strip, FREESTYLE LITE TEST (In Vitro Strip)  check fsbs once a day for 0 days  Quantity: 100.00;  Refills: 5   Ordered :31-Dec-2009  Steele Sizer MD;  Dani Gobble Active Comments: DX: 250.00, Disp: , Rfl:  .  LANCETS ULTRA THIN 30G MISC, , Disp: , Rfl:  .  metFORMIN (GLUCOPHAGE-XR) 750 MG 24 hr tablet, Take 1 tablet (750 mg total) by mouth daily with breakfast., Disp: 90 tablet, Rfl: 1 .  Cyanocobalamin 500 MCG SUBL, Place 1 tablet under the tongue daily., Disp: , Rfl:  .  fluticasone (FLONASE) 50 MCG/ACT nasal spray, Place 2 sprays into both nostrils daily. (Patient not taking: Reported on 05/22/2017), Disp: 16 g, Rfl: 6 .  pregabalin (LYRICA) 75 MG capsule, Take 1 capsule (75 mg total) by mouth 2 (two) times daily., Disp: 180 capsule, Rfl: 1  No Known Allergies   ROS  Constitutional: Negative for fever or weight change.  Respiratory: Negative for daily  Cough or  shortness of breath.   Cardiovascular: Negative for chest pain or palpitations.  Gastrointestinal: Negative for abdominal pain, no bowel changes.  Musculoskeletal: Negative for gait problem or joint swelling.  Skin: Negative for rash.  Neurological: Negative for dizziness or headache.  No other specific complaints in a complete  review of systems (except as listed in HPI above).  Objective  Vitals:   05/22/17 1530  BP: 118/66  Pulse: 81  Resp: 16  Temp: 98.4 F (36.9 C)  TempSrc: Oral  SpO2: 97%  Weight: 211 lb 11.2 oz (96 kg)  Height: 5\' 7"  (1.702 m)    Body mass index is 33.16 kg/m.  Physical Exam  Constitutional: Patient appears well-developed and well-nourished. Obese  No distress.  HEENT: head atraumatic, normocephalic, pupils equal and reactive to light,  neck supple, throat within normal limits Cardiovascular: Normal rate, regular rhythm and normal heart sounds.  No murmur heard. Trace BLE edema. Pulmonary/Chest: Effort normal and breath sounds normal. No respiratory distress. Abdominal: Soft.  There is no tenderness. Psychiatric: Patient has a normal mood and affect. behavior is normal. Judgment and thought content normal.  Recent Results (from the past 2160 hour(s))  HM COLONOSCOPY     Status: None   Collection Time: 02/22/17 12:00 AM  Result Value Ref Range   HM Colonoscopy See Report (in chart) See Report (in chart), Patient Reported  Cologuard     Status: None   Collection Time: 02/22/17 12:00 AM  Result Value Ref Range   Cologuard Negative   CBC with Differential/Platelet     Status: Abnormal   Collection Time: 02/22/17  9:10 AM  Result Value Ref Range   WBC 6.0 3.8 - 10.8 Thousand/uL   RBC 4.32 3.80 - 5.10 Million/uL   Hemoglobin 11.2 (L) 11.7 - 15.5 g/dL   HCT 34.2 (L) 35.0 - 45.0 %   MCV 79.2 (L) 80.0 - 100.0 fL   MCH 25.9 (L) 27.0 - 33.0 pg   MCHC 32.7 32.0 - 36.0 g/dL   RDW 14.7 11.0 - 15.0 %   Platelets 346 140 - 400 Thousand/uL   MPV 9.5 7.5 - 12.5 fL   Neutro Abs 2,910 1,500 - 7,800 cells/uL   Lymphs  Abs 2,622 850 - 3,900 cells/uL   WBC mixed population 330 200 - 950 cells/uL   Eosinophils Absolute 108 15 - 500 cells/uL   Basophils Absolute 30 0 - 200 cells/uL   Neutrophils Relative % 48.5 %   Total Lymphocyte 43.7 %   Monocytes Relative 5.5 %   Eosinophils  Relative 1.8 %   Basophils Relative 0.5 %  COMPLETE METABOLIC PANEL WITH GFR     Status: Abnormal   Collection Time: 02/22/17  9:10 AM  Result Value Ref Range   Glucose, Bld 118 (H) 65 - 99 mg/dL    Comment: .            Fasting reference interval . For someone without known diabetes, a glucose value between 100 and 125 mg/dL is consistent with prediabetes and should be confirmed with a follow-up test. .    BUN 21 7 - 25 mg/dL   Creat 0.98 0.50 - 0.99 mg/dL    Comment: For patients >26 years of age, the reference limit for Creatinine is approximately 13% higher for people identified as African-American. .    GFR, Est Non African American 62 > OR = 60 mL/min/1.68m2   GFR, Est African American 72 > OR = 60 mL/min/1.36m2   BUN/Creatinine Ratio NOT APPLICABLE 6 - 22 (calc)   Sodium 143 135 - 146 mmol/L   Potassium 4.2 3.5 - 5.3 mmol/L   Chloride 105 98 - 110 mmol/L   CO2 30 20 - 32 mmol/L   Calcium 9.7 8.6 - 10.4 mg/dL   Total Protein 7.5 6.1 - 8.1 g/dL   Albumin 4.0 3.6 - 5.1 g/dL   Globulin 3.5 1.9 - 3.7 g/dL (calc)   AG Ratio 1.1 1.0 - 2.5 (calc)   Total Bilirubin 0.3 0.2 - 1.2 mg/dL   Alkaline phosphatase (APISO) 79 33 - 130 U/L   AST 12 10 - 35 U/L   ALT 12 6 - 29 U/L  Lipid panel     Status: Abnormal   Collection Time: 02/22/17  9:10 AM  Result Value Ref Range   Cholesterol 140 <200 mg/dL   HDL 48 (L) >50 mg/dL   Triglycerides 153 (H) <150 mg/dL   LDL Cholesterol (Calc) 68 mg/dL (calc)    Comment: Reference range: <100 . Desirable range <100 mg/dL for primary prevention;   <70 mg/dL for patients with CHD or diabetic patients  with > or = 2 CHD risk factors. Marland Kitchen LDL-C is now calculated using the Martin-Hopkins  calculation, which is a validated novel method providing  better accuracy than the Friedewald equation in the  estimation of LDL-C.  Cresenciano Genre et al. Annamaria Helling. 2297;989(21): 2061-2068  (http://education.[facility].com/faq/FAQ164)    Total CHOL/HDL  Ratio 2.9 <5.0 (calc)   Non-HDL Cholesterol (Calc) 92 <130 mg/dL (calc)    Comment: For patients with diabetes plus 1 major ASCVD risk  factor, treating to a non-HDL-C goal of <100 mg/dL  (LDL-C of <70 mg/dL) is considered a therapeutic  option.   POCT HgB A1C     Status: None   Collection Time: 05/22/17  3:36 PM  Result Value Ref Range   Hemoglobin A1C 6.4      PHQ2/9: Depression screen [facility] 2/9 05/22/2017 02/20/2017 10/26/2016 07/26/2016 04/12/2016  Decreased Interest 0 0 0 0 0  Down, Depressed, Hopeless 0 0 0 0 0  PHQ - 2 Score 0 0 0 0 0     Fall Risk: Fall Risk  05/22/2017 02/20/2017 10/26/2016 07/26/2016 04/12/2016  Falls  in the past year? No No No No No     Functional Status Survey: Is the patient deaf or have difficulty hearing?: No Does the patient have difficulty seeing, even when wearing glasses/contacts?: No Does the patient have difficulty concentrating, remembering, or making decisions?: No Does the patient have difficulty walking or climbing stairs?: No Does the patient have difficulty dressing or bathing?: No Does the patient have difficulty doing errands alone such as visiting a doctor's office or shopping?: No   Assessment & Plan  1. Controlled type 2 diabetes mellitus with microalbuminuria, without long-term current use of insulin (HCC)  - POCT HgB A1C  2. Meralgia paraesthetica, right  Never got rx but willing to try it, it was not as pharmacy last time  - pregabalin (LYRICA) 75 MG capsule; Take 1 capsule (75 mg total) by mouth 2 (two) times daily.  Dispense: 180 capsule; Refill: 1   3. Thoracic aortic atherosclerosis ([place])  On statin therapy and aspirin daily   4. Dyslipidemia  On statin therapy, last LDL was at goal   5. Hypertension, essential, benign  At goal, denies dizziness  6. Controlled gout  Under control, denies recent episodes of gout  7. Morbid obesity, unspecified obesity type [facility])  Discussed with the patient the risk posed by an  increased BMI. Discussed importance of portion control, calorie counting and at least 150 minutes of physical activity weekly. Avoid sweet beverages and drink more water. Eat at least 6 servings of fruit and vegetables daily   8. Anemia, chronic disease  stable

## 2017-08-23 ENCOUNTER — Ambulatory Visit (INDEPENDENT_AMBULATORY_CARE_PROVIDER_SITE_OTHER): Payer: Commercial Managed Care - PPO | Admitting: Family Medicine

## 2017-08-23 ENCOUNTER — Encounter: Payer: Self-pay | Admitting: Family Medicine

## 2017-08-23 VITALS — BP 118/78 | HR 76 | Temp 98.1°F | Resp 16 | Ht 67.0 in | Wt 219.9 lb

## 2017-08-23 DIAGNOSIS — R809 Proteinuria, unspecified: Secondary | ICD-10-CM | POA: Diagnosis not present

## 2017-08-23 DIAGNOSIS — G5711 Meralgia paresthetica, right lower limb: Secondary | ICD-10-CM

## 2017-08-23 DIAGNOSIS — E559 Vitamin D deficiency, unspecified: Secondary | ICD-10-CM

## 2017-08-23 DIAGNOSIS — D638 Anemia in other chronic diseases classified elsewhere: Secondary | ICD-10-CM

## 2017-08-23 DIAGNOSIS — E538 Deficiency of other specified B group vitamins: Secondary | ICD-10-CM

## 2017-08-23 DIAGNOSIS — M109 Gout, unspecified: Secondary | ICD-10-CM

## 2017-08-23 DIAGNOSIS — L301 Dyshidrosis [pompholyx]: Secondary | ICD-10-CM

## 2017-08-23 DIAGNOSIS — I10 Essential (primary) hypertension: Secondary | ICD-10-CM

## 2017-08-23 DIAGNOSIS — Z23 Encounter for immunization: Secondary | ICD-10-CM | POA: Diagnosis not present

## 2017-08-23 DIAGNOSIS — E785 Hyperlipidemia, unspecified: Secondary | ICD-10-CM

## 2017-08-23 DIAGNOSIS — E1129 Type 2 diabetes mellitus with other diabetic kidney complication: Secondary | ICD-10-CM

## 2017-08-23 LAB — POCT GLYCOSYLATED HEMOGLOBIN (HGB A1C): HbA1c, POC (prediabetic range): 6.5 % — AB (ref 5.7–6.4)

## 2017-08-23 MED ORDER — AMMONIUM LACTATE 12 % EX CREA: TOPICAL_CREAM | CUTANEOUS | 0 refills | Status: DC | PRN

## 2017-08-23 MED ORDER — CARVEDILOL 12.5 MG PO TABS: 12.5000 mg | ORAL_TABLET | Freq: Two times a day (BID) | ORAL | 1 refills | Status: DC

## 2017-08-23 MED ORDER — ATORVASTATIN CALCIUM 40 MG PO TABS: 40.0000 mg | ORAL_TABLET | Freq: Every day | ORAL | 1 refills | Status: DC

## 2017-08-23 MED ORDER — ALLOPURINOL 100 MG PO TABS: 100.0000 mg | ORAL_TABLET | Freq: Every day | ORAL | 1 refills | Status: DC

## 2017-08-23 MED ORDER — AMLODIPINE-OLMESARTAN 10-40 MG PO TABS: 1.0000 | ORAL_TABLET | Freq: Every day | ORAL | 1 refills | Status: DC

## 2017-08-23 MED ORDER — METFORMIN HCL ER 750 MG PO TB24: 750.0000 mg | ORAL_TABLET | Freq: Every day | ORAL | 1 refills | Status: DC

## 2017-08-23 NOTE — Progress Notes (Signed)
z

## 2017-08-23 NOTE — Progress Notes (Signed)
Name: Cheryl Barr   MRN: [id number]    DOB: 1955-10-15   Date:08/23/2017       Progress Note  Subjective  Chief Complaint  Chief Complaint  Patient presents with  . Diabetes    patient does not check her bloood sugar. patient has some tingling   . Hypertension    patient does not check her BP, no neg sx  . Hyperlipidemia    no neg sx  . Medication Refill    HPI  HTN: compliant with medication, Azor, and carvedilol, we will decrease dose since bp has been towards low end of normal and monitor. Shedenies side effects, no chest pain, palpitation or SOB.Off diureticsbecause of gout.  She is following a DASH, she has occasional cough, seen by pulmonologist , explained it may be secondary to ARB but because of microalbuminuria we will continue ARB  Hyperlipidemia: last LDL was at goal, continue high dose statin therapy, denies side effects of medication and is compliant with medications, LDL was  at goal01/2019. Marland Kitchen Atherosclerosis aorta, continue statin and aspirin. Unchanged   DMII with microalbuminuria: urine micro was 100 in Dec 2016 and Nov 2017 and 02/2017 Taking ARB, and aspirin, also Metfomin, she has symptoms of meralgia paresthetica on right side for over one year, she is now on Lyrica and seems to help with symptoms. Off gabapentin because the pain was constant burning sensation on right lateral thigh.t.Eye exam is up to date. No polyphagia, polydipsia or polyuria, she has nocturia, but she states secondary to hot flashes and needs to drink water during the night to control her body temperature. No changes of symptoms. HgbA1C has been at goal, today it was 6.5%  Gout: . She is back on Allopurinol now . Doing well at this time. No recent episodes. Last uric acid 6.2. Off HCTZ  Obesity: she was losing without dietary changes, but now she wants to gain weight back, clothes are not fitting her. She has gained 6 lbs since last visit. Discussed importance of healthy diet and  keeping BMI low to avoid other complications secondary obesity.  Cough: seen by Dr. 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 She states symptoms are worse when she has an URI. Marland Kitchen No wheezing,she still has intermittent episodes of cough.No longer daily and does not last for hours. She states medications does not work and is off inhalers.    Patient Active Problem List   Diagnosis Date Noted  . Incomplete rotator cuff tear or rupture of left shoulder, not specified as traumatic 07/26/2016  . Thoracic aortic atherosclerosis ([place]) 01/12/2016  . 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 B12 deficiency 01/11/2016  . Hernia of abdominal cavity 09/15/2014  . Impaired renal function 08/10/2014  . Controlled type 2 diabetes with renal manifestation ([place]) 08/10/2014  . Dyslipidemia 08/10/2014  . Interval gout 08/10/2014  . Microalbuminuria 08/10/2014  . Morbid obesity ([place]) 08/10/2014  . Osteoarthritis of left knee 08/10/2014  . Vitamin D deficiency 08/10/2014  . Cough 01/21/2014  . Intraductal papilloma of breast 06/27/2013  . Hypertension, essential, benign 09/02/2009  . ABNORMAL EKG 09/02/2009    Past Surgical History:  Procedure Laterality Date  . BREAST BIOPSY Left 05/24/2013   neg  . BREAST EXCISIONAL BIOPSY Left 06/21/2013   neg/ dr. Bary Castilla  . BREAST SURGERY Left 2015  . COLONOSCOPY  2009   1 polyp found. Dr. Roselyn Reef  . HERNIA REPAIR  10/09/2014  Ventral/umbilical hernia repair with 10 x 10 cm Atrium mesh in preperitoneal position.  . INSERTION OF MESH N/A 10/09/2014   Procedure: INSERTION OF MESH;  Surgeon: Robert Bellow, MD;  Location: [facility] ORS;  Service: General;  Laterality: N/A;  . KNEE SURGERY Left 01/14/2009  . STRABISMUS SURGERY     Repair as a child  . TUBAL LIGATION  1990  . VENTRAL HERNIA REPAIR N/A 10/09/2014   Procedure: HERNIA REPAIR VENTRAL ADULT;  Surgeon: Robert Bellow, MD;  Location:  [facility] ORS;  Service: General;  Laterality: N/A;    Family History  Problem Relation Age of Onset  . Hypertension Mother   . Diabetes Mother   . Heart attack Father   . Coronary artery disease Father   . Breast cancer Father 22  . Allergies Son        Barista  . Diabetes Sister   . Diabetes Brother   . Gout Brother     Social History   Socioeconomic History  . Marital status: Widowed    Spouse name: Not on file  . Number of children: 3  . Years of education: 12th Grade  . Highest education level: Not on file  Occupational History    Employer: [employer]  Social Needs  . Financial resource strain: Not hard at all  . Food insecurity:    Worry: Never true    Inability: Never true  . Transportation needs:    Medical: No    Non-medical: No  Tobacco Use  . Smoking status: Former Smoker    Types: Cigarettes    Last attempt to quit: 10/02/1983    Years since quitting: 33.9  . Smokeless tobacco: Never Used  Substance and Sexual Activity  . Alcohol use: No    Alcohol/week: 0.0 oz  . Drug use: No  . Sexual activity: Yes    Partners: Male    Birth control/protection: None  Lifestyle  . Physical activity:    Days per week: 7 days    Minutes per session: 60 min  . Stress: Only a little  Relationships  . Social connections:    Talks on phone: More than three times a week    Gets together: More than three times a week    Attends religious service: More than 4 times per year    Active member of club or organization: Yes    Attends meetings of clubs or organizations: More than 4 times per year    Relationship status: Widowed  . Intimate partner violence:    Fear of current or ex partner: No    Emotionally abused: No    Physically abused: No    Forced sexual activity: No  Other Topics Concern  . Not on file  Social History Narrative   Widowed.    No regular exercise.     Current Outpatient Medications:  .  allopurinol (ZYLOPRIM) 100 MG  tablet, Take 1 tablet (100 mg total) by mouth daily., Disp: 90 tablet, Rfl: 1 .  amLODipine-olmesartan (AZOR) 10-40 MG tablet, Take 1 tablet by mouth daily., Disp: 90 tablet, Rfl: 1 .  aspirin 81 MG EC tablet, Take 81 mg by mouth daily.  , Disp: , Rfl:  .  atorvastatin (LIPITOR) 40 MG tablet, Take 1 tablet (40 mg total) by mouth daily., Disp: 90 tablet, Rfl: 1 .  carvedilol (COREG) 12.5 MG tablet, Take 1 tablet (12.5 mg total) by mouth 2 (two) times daily with a meal.,  Disp: 180 tablet, Rfl: 1 .  Cyanocobalamin 500 MCG SUBL, Place 1 tablet under the tongue daily., Disp: , Rfl:  .  fluticasone (FLONASE) 50 MCG/ACT nasal spray, Place 2 sprays into both nostrils daily., Disp: 16 g, Rfl: 6 .  glucose blood (FREESTYLE LITE) test strip, FREESTYLE LITE TEST (In Vitro Strip)  check fsbs once a day for 0 days  Quantity: 100.00;  Refills: 5   Ordered :31-Dec-2009  Steele Sizer MD;  Dani Gobble Active Comments: DX: 250.00, Disp: , Rfl:  .  LANCETS ULTRA THIN 30G MISC, , Disp: , Rfl:  .  metFORMIN (GLUCOPHAGE-XR) 750 MG 24 hr tablet, Take 1 tablet (750 mg total) by mouth daily with breakfast., Disp: 90 tablet, Rfl: 1 .  pregabalin (LYRICA) 75 MG capsule, Take 1 capsule (75 mg total) by mouth 2 (two) times daily., Disp: 180 capsule, Rfl: 1 .  ammonium lactate (LAC-HYDRIN) 12 % cream, Apply topically as needed for dry skin., Disp: 385 g, Rfl: 0  No Known Allergies   ROS  Constitutional: Negative for fever or weight change.  Respiratory: Negative for cough and shortness of breath.   Cardiovascular: Negative for chest pain or palpitations.  Gastrointestinal: Negative for abdominal pain, no bowel changes.  Musculoskeletal: Negative for gait problem or joint swelling.  Skin: Negative for rash.  Neurological: Negative for dizziness or headache.  No other specific complaints in a complete review of systems (except as listed in HPI above).  Objective  Vitals:   08/23/17 1259  BP: 118/78  Pulse:  76  Resp: 16  Temp: 98.1 F (36.7 C)  TempSrc: Oral  SpO2: 96%  Weight: 219 lb 14.4 oz (99.7 kg)  Height: 5\' 7"  (1.702 m)    Body mass index is 34.44 kg/m.  Physical Exam  Constitutional: Patient appears well-developed and well-nourished. Obese  No distress.  HEENT: head atraumatic, normocephalic, pupils equal and reactive to light, neck supple, throat within normal limits Cardiovascular: Normal rate, regular rhythm and normal heart sounds.  No murmur heard. No BLE edema. Pulmonary/Chest: Effort normal and breath sounds normal. No respiratory distress. Abdominal: Soft.  There is no tenderness. Psychiatric: Patient has a normal mood and affect. behavior is normal. Judgment and thought content normal.  Diabetic Foot Exam: Diabetic Foot Exam - Simple   Simple Foot Form Diabetic Foot exam was performed with the following findings:  Yes 08/23/2017  1:23 PM  Visual Inspection See comments:  Yes Sensation Testing Intact to touch and monofilament testing bilaterally:  Yes Pulse Check Posterior Tibialis and Dorsalis pulse intact bilaterally:  Yes Comments Dry and states has intermittent blister on side      PHQ2/9: Depression screen [facility] 2/9 08/23/2017 05/22/2017 02/20/2017 10/26/2016 07/26/2016  Decreased Interest 0 0 0 0 0  Down, Depressed, Hopeless 0 0 0 0 0  PHQ - 2 Score 0 0 0 0 0     Fall Risk: Fall Risk  08/23/2017 05/22/2017 02/20/2017 10/26/2016 07/26/2016  Falls in the past year? No No No No No     Functional Status Survey: Is the patient deaf or have difficulty hearing?: No Does the patient have difficulty seeing, even when wearing glasses/contacts?: No Does the patient have difficulty concentrating, remembering, or making decisions?: No Does the patient have difficulty walking or climbing stairs?: No Does the patient have difficulty dressing or bathing?: No Does the patient have difficulty doing errands alone such as visiting a doctor's office or shopping?:  No    Assessment & Plan  1. Controlled type 2 diabetes mellitus with microalbuminuria, without long-term current use of insulin (HCC)  - POCT HgB A1C - metFORMIN (GLUCOPHAGE-XR) 750 MG 24 hr tablet; Take 1 tablet (750 mg total) by mouth daily with breakfast.  Dispense: 90 tablet; Refill: 1  2. Dyslipidemia  - atorvastatin (LIPITOR) 40 MG tablet; Take 1 tablet (40 mg total) by mouth daily.  Dispense: 90 tablet; Refill: 1  3. Hypertension, essential, benign  Staying below 130/80 we will decrease dose of coreg to 12.5 mg twice daily and monitor  - amLODipine-olmesartan (AZOR) 10-40 MG tablet; Take 1 tablet by mouth daily.  Dispense: 90 tablet; Refill: 1 - carvedilol (COREG) 12.5 MG tablet; Take 1 tablet (12.5 mg total) by mouth 2 (two) times daily with a meal.  Dispense: 180 tablet; Refill: 1  4. Controlled gout  - allopurinol (ZYLOPRIM) 100 MG tablet; Take 1 tablet (100 mg total) by mouth daily.  Dispense: 90 tablet; Refill: 1  5. Anemia, chronic disease   6. B12 deficiency  Continue B12 supplementation   7. Vitamin D deficiency  Continue supplementation   8. Morbid obesity, unspecified obesity type ([place])  Gaining weight, she wants to. Advised to monitor intake of food, not healthy   9. Meralgia paresthetica of right side  stable  10. Need for zoster vaccine  - Varicella-zoster vaccine IM (Shingrix)  11. Eczema, dyshidrotic  - ammonium lactate (LAC-HYDRIN) 12 % cream; Apply topically as needed for dry skin.  Dispense: 385 g; Refill: 0

## 2017-08-25 ENCOUNTER — Ambulatory Visit: Payer: Commercial Managed Care - PPO | Admitting: Family Medicine

## 2017-09-17 ENCOUNTER — Other Ambulatory Visit: Payer: Self-pay

## 2017-09-17 ENCOUNTER — Emergency Department
Admission: EM | Admit: 2017-09-17 | Discharge: 2017-09-17 | Disposition: A | Discharge status: Home / Self Care | Payer: Commercial Managed Care - PPO | Attending: Emergency Medicine | Admitting: Emergency Medicine

## 2017-09-17 ENCOUNTER — Encounter: Payer: Self-pay | Admitting: Emergency Medicine

## 2017-09-17 DIAGNOSIS — N390 Urinary tract infection, site not specified: Secondary | ICD-10-CM | POA: Insufficient documentation

## 2017-09-17 DIAGNOSIS — I10 Essential (primary) hypertension: Secondary | ICD-10-CM | POA: Insufficient documentation

## 2017-09-17 DIAGNOSIS — Z7982 Long term (current) use of aspirin: Secondary | ICD-10-CM | POA: Insufficient documentation

## 2017-09-17 DIAGNOSIS — Z7984 Long term (current) use of oral hypoglycemic drugs: Secondary | ICD-10-CM | POA: Diagnosis not present

## 2017-09-17 DIAGNOSIS — E119 Type 2 diabetes mellitus without complications: Secondary | ICD-10-CM | POA: Insufficient documentation

## 2017-09-17 DIAGNOSIS — Z87891 Personal history of nicotine dependence: Secondary | ICD-10-CM | POA: Diagnosis not present

## 2017-09-17 DIAGNOSIS — B9689 Other specified bacterial agents as the cause of diseases classified elsewhere: Secondary | ICD-10-CM | POA: Insufficient documentation

## 2017-09-17 DIAGNOSIS — N760 Acute vaginitis: Secondary | ICD-10-CM | POA: Diagnosis not present

## 2017-09-17 DIAGNOSIS — Z79899 Other long term (current) drug therapy: Secondary | ICD-10-CM | POA: Diagnosis not present

## 2017-09-17 DIAGNOSIS — R319 Hematuria, unspecified: Secondary | ICD-10-CM | POA: Diagnosis present

## 2017-09-17 LAB — WET PREP, GENITAL
Sperm: NONE SEEN
YEAST WET PREP: NONE SEEN

## 2017-09-17 LAB — URINALYSIS, COMPLETE (UACMP) WITH MICROSCOPIC
Bilirubin Urine: NEGATIVE
GLUCOSE, UA: NEGATIVE mg/dL
Ketones, ur: NEGATIVE mg/dL
Nitrite: POSITIVE — AB
PROTEIN: 100 mg/dL — AB
Specific Gravity, Urine: 1.012 (ref 1.005–1.030)
pH: 6 (ref 5.0–8.0)

## 2017-09-17 MED ORDER — AMOXICILLIN-POT CLAVULANATE 875-125 MG PO TABS
1.0000 | ORAL_TABLET | Freq: Once | ORAL | Status: AC
  Administered 2017-09-17: 1 via ORAL
  Filled 2017-09-17: qty 1

## 2017-09-17 MED ORDER — AMOXICILLIN-POT CLAVULANATE 875-125 MG PO TABS: 1.0000 | ORAL_TABLET | Freq: Two times a day (BID) | ORAL | 0 refills | Status: DC

## 2017-09-17 MED ORDER — METRONIDAZOLE 500 MG PO TABS: 500.0000 mg | ORAL_TABLET | Freq: Two times a day (BID) | ORAL | 0 refills | Status: AC

## 2017-09-17 NOTE — ED Triage Notes (Signed)
Pt reports for about a week she has noticed blood when she wipes after she urinates. Unsure if it is from her urine, vagina or if she has a sore on her vagina.

## 2017-09-17 NOTE — Discharge Instructions (Addendum)
I recommend follow-up this week with your primary doctor to see that your symptoms have improved or cleared up.  Additionally, it will be important to see a Gynecology specialist in follow-up to discuss your symptoms and assure appropriate ongoing evaluation. Please call Dr. Andreas Blower office to setup an appointment for follow-up with her as well within a couple weeks time.

## 2017-09-17 NOTE — ED Notes (Signed)
Pt reports 3-4 days of thin clear vaginal discharge along with urinary frequency; pt says sometimes when she wipes she notices small amount of blood; denies dysuria; denies fever; pt awake and alert; no acute distress

## 2017-09-17 NOTE — ED Provider Notes (Signed)
[facility] Provider Note   ____________________________________________   First MD Initiated Contact with Patient 09/17/17 [phone]     (approximate)  I have reviewed the triage vital signs and the nursing notes.   HISTORY  Chief Complaint Hematuria and Vaginal Discharge    HPI Cheryl Barr is a 62 y.o. female last 2 times she is urinated she has noticed small amount of blood on the tissue after urinating.  She also reports is uncomfortable in the area when she urinates.  No nausea vomiting.  She saw a small amount of bleeding.  Is not been associated with any pain fevers or chills.  No nausea or vomiting.  No abdominal discomfort.  No pain in her back.  Reports never had symptoms like this before.  Has had a yeast infection but is been a long time ago. Has not taken any medications for this.  Does take medications for diabetes.  Past Medical History:  Diagnosis Date  . Arthritis   . Colon polyp 2009  . Depression   . Diabetes mellitus    Type 2  . Fibromyalgia   . Gout   . Hyperlipidemia   . Hypertension   . Insomnia   . Menopause   . Vitamin D deficiency     Patient Active Problem List   Diagnosis Date Noted  . Incomplete rotator cuff tear or rupture of left shoulder, not specified as traumatic 07/26/2016  . Thoracic aortic atherosclerosis ([place]) 01/12/2016  . 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 B12 deficiency 01/11/2016  . Hernia of abdominal cavity 09/15/2014  . Impaired renal function 08/10/2014  . Controlled type 2 diabetes with renal manifestation ([place]) 08/10/2014  . Dyslipidemia 08/10/2014  . Interval gout 08/10/2014  . Microalbuminuria 08/10/2014  . Morbid obesity ([place]) 08/10/2014  . Osteoarthritis of left knee 08/10/2014  . Vitamin D deficiency 08/10/2014  . Cough 01/21/2014  . Intraductal papilloma of breast 06/27/2013  . Hypertension, essential, benign 09/02/2009  . ABNORMAL EKG  09/02/2009    Past Surgical History:  Procedure Laterality Date  . BREAST BIOPSY Left 05/24/2013   neg  . BREAST EXCISIONAL BIOPSY Left 06/21/2013   neg/ dr. Bary Castilla  . BREAST SURGERY Left 2015  . COLONOSCOPY  2009   1 polyp found. Dr. Roselyn Reef  . HERNIA REPAIR  86/76/1950   Ventral/umbilical hernia repair with 10 x 10 cm Atrium mesh in preperitoneal position.  . INSERTION OF MESH N/A 10/09/2014   Procedure: INSERTION OF MESH;  Surgeon: Robert Bellow, MD;  Location: [facility] ORS;  Service: General;  Laterality: N/A;  . KNEE SURGERY Left 01/14/2009  . STRABISMUS SURGERY     Repair as a child  . TUBAL LIGATION  1990  . VENTRAL HERNIA REPAIR N/A 10/09/2014   Procedure: HERNIA REPAIR VENTRAL ADULT;  Surgeon: Robert Bellow, MD;  Location: [facility] ORS;  Service: General;  Laterality: N/A;    Prior to Admission medications   Medication Sig Start Date End Date Taking? Authorizing Provider  allopurinol (ZYLOPRIM) 100 MG tablet Take 1 tablet (100 mg total) by mouth daily. 08/23/17   Steele Sizer, MD  amLODipine-olmesartan (AZOR) 10-40 MG tablet Take 1 tablet by mouth daily. 08/23/17   Steele Sizer, MD  ammonium lactate (LAC-HYDRIN) 12 % cream Apply topically as needed for dry skin. 08/23/17   Steele Sizer, MD  amoxicillin-clavulanate (AUGMENTIN) 875-125 MG tablet Take 1 tablet by mouth 2 (two) times daily. 09/17/17   Delman Kitten, MD  aspirin 81 MG EC tablet Take 81 mg by mouth daily.      [provider]  atorvastatin (LIPITOR) 40 MG tablet Take 1 tablet (40 mg total) by mouth daily. 08/23/17   Steele Sizer, MD  carvedilol (COREG) 12.5 MG tablet Take 1 tablet (12.5 mg total) by mouth 2 (two) times daily with a meal. 08/23/17   Sowles, Drue Stager, MD  Cyanocobalamin 500 MCG SUBL Place 1 tablet under the tongue daily.    [provider]  fluticasone (FLONASE) 50 MCG/ACT nasal spray Place 2 sprays into both nostrils daily. 04/06/17   Hubbard Hartshorn, FNP  glucose blood (FREESTYLE  LITE) test strip FREESTYLE LITE TEST (In Vitro Strip)  check fsbs once a day for 0 days  Quantity: 100.00;  Refills: 5   Ordered :31-Dec-2009  Steele Sizer MD;  Buddy Duty 02-Jan-2009 Active Comments: DX: 250.00 01/02/09   [provider]  LANCETS ULTRA THIN 30G [place]     [provider]  metFORMIN (GLUCOPHAGE-XR) 750 MG 24 hr tablet Take 1 tablet (750 mg total) by mouth daily with breakfast. 08/23/17   Ancil Boozer, Drue Stager, MD  metroNIDAZOLE (FLAGYL) 500 MG tablet Take 1 tablet (500 mg total) by mouth 2 (two) times daily for 7 days. 09/17/17 09/24/17  Delman Kitten, MD  pregabalin (LYRICA) 75 MG capsule Take 1 capsule (75 mg total) by mouth 2 (two) times daily. 05/22/17   Steele Sizer, MD    Allergies Patient has no known allergies.  Family History  Problem Relation Age of Onset  . Hypertension Mother   . Diabetes Mother   . Heart attack Father   . Coronary artery disease Father   . Breast cancer Father 64  . Allergies Son        Barista  . Diabetes Sister   . Diabetes Brother   . Gout Brother     Social History Social History   Tobacco Use  . Smoking status: Former Smoker    Types: Cigarettes    Last attempt to quit: 10/02/1983    Years since quitting: 33.9  . Smokeless tobacco: Never Used  Substance Use Topics  . Alcohol use: No    Alcohol/week: 0.0 oz  . Drug use: No    Review of Systems Constitutional: No fever/chills ENT: No sore throat. Respiratory: Denies shortness of breath. Gastrointestinal: No abdominal pain.  No nausea, no vomiting.  No diarrhea.  No constipation. Genitourinary: Some burning when she urinates and wipes around the urethra.  Able to urinate without straining.  Does not feel her bladder is blocked. Musculoskeletal: Negative for back pain. Skin: Negative for rash. Neurological: Negative for headaches, focal weakness or numbness.    ____________________________________________   PHYSICAL EXAM:  VITAL SIGNS: ED  Triage Vitals  Enc Vitals Group     BP 09/17/17 0515 138/76     Pulse Rate 09/17/17 0515 67     Resp 09/17/17 0515 16     Temp 09/17/17 0515 98.3 F (36.8 C)     Temp Source 09/17/17 0515 Oral     SpO2 09/17/17 0515 98 %     Weight 09/17/17 0515 215 lb (97.5 kg)     Height 09/17/17 0515 5\' 7"  (1.702 m)     Head Circumference --      Peak Flow --      Pain Score 09/17/17 0523 1     Pain Loc --      Pain Edu? --  Excl. in [place]? --     Constitutional: Alert and oriented. Well appearing and in no acute distress.  She is very pleasant, resting comfortably. Eyes: Conjunctivae are normal. Head: Atraumatic. Nose: No congestion/rhinnorhea. Mouth/Throat: Mucous membranes are moist. Neck: No stridor.   Cardiovascular: Normal rate, regular rhythm. Grossly normal heart sounds.  Good peripheral circulation. Respiratory: Normal respiratory effort.  No retractions. Lungs CTAB. Gastrointestinal: Soft and nontender. No distention. Genitourinary: External exam of the labia's is normal without any abscess swelling or purulence or abnormal discharge.  The area around the urethra appears free of inflammatory change.  There is no tenderness or abscess.  Intravaginally there is just a very slight tinge of redness upon swabbing, wet prep was sent.  No intravaginal discomfort or discharge noted. Gynecologic exam escorted by nurse Katie Musculoskeletal: Normal strength in all extremities. Neurologic:  Normal speech and language. No gross focal neurologic deficits are appreciated.  She walks with a normal gait. Skin:  Skin is warm, dry and intact. No rash noted. Psychiatric: Mood and affect are normal. Speech and behavior are normal.  ____________________________________________   LABS (all labs ordered are listed, but only abnormal results are displayed)  Labs Reviewed  WET PREP, GENITAL - Abnormal; Notable for the following components:      Result Value   Trich, Wet Prep PRESENT (*)    Clue Cells  Wet Prep HPF POC PRESENT (*)    WBC, Wet Prep HPF POC MANY (*)    All other components within normal limits  URINALYSIS, COMPLETE (UACMP) WITH MICROSCOPIC - Abnormal; Notable for the following components:   Color, Urine YELLOW (*)    APPearance CLOUDY (*)    Hgb urine dipstick LARGE (*)    Protein, ur 100 (*)    Nitrite POSITIVE (*)    Leukocytes, UA LARGE (*)    RBC / HPF >50 (*)    WBC, UA >50 (*)    Bacteria, UA FEW (*)    All other components within normal limits  URINE CULTURE   ____________________________________________  EKG   ____________________________________________  RADIOLOGY   ____________________________________________   PROCEDURES  Procedure(s) performed: None  Procedures  Critical Care performed: No  ____________________________________________   INITIAL IMPRESSION / ASSESSMENT AND PLAN / ED COURSE  Pertinent labs & imaging results that were available during my care of the patient were reviewed by me and considered in my medical decision making (see chart for details).  Patient presents for evaluation of seeing small amount of blood on tissue after wiping around the urethra.  She does not associate any abdominal symptoms with this.  No systemic or infectious symptoms are associated except for a burning with urination.  She has normal vital signs.  No associated flank pain or abdominal discomfort.  Seems to make kidney stones and unlikely etiology or acute intra-abdominal pathology.  Urinalysis does demonstrate notable blood nitrates leukocytes whites and bacteria.  There is may be consistent with a urinary tract infection. Wet prep with trich and WBCs  Discussed with patient that she needs to follow-up both with her primary doctor and I would also recommend strongly that she follow-up with gynecology to assure this is not from a uterine problem such as a tumor or mass, she is in agreement.  Return precautions and treatment recommendations and  follow-up discussed with the patient who is agreeable with the plan.       ____________________________________________   FINAL CLINICAL IMPRESSION(S) / ED DIAGNOSES  Final diagnoses:  Lower urinary tract  infection, acute  BV (bacterial vaginosis)      NEW MEDICATIONS STARTED DURING THIS VISIT:  New Prescriptions   AMOXICILLIN-CLAVULANATE (AUGMENTIN) 875-125 MG TABLET    Take 1 tablet by mouth 2 (two) times daily.   METRONIDAZOLE (FLAGYL) 500 MG TABLET    Take 1 tablet (500 mg total) by mouth 2 (two) times daily for 7 days.     Note:  This document was prepared using Dragon voice recognition software and may include unintentional dictation errors.     Delman Kitten, MD 09/17/17 [phone]

## 2017-09-19 LAB — URINE CULTURE
Culture: >=100000 — AB
Special Requests: NORMAL

## 2017-09-20 NOTE — Progress Notes (Signed)
Pharmacy Antibiotic Note  Cheryl Barr is a 62 y.o. female was seen in the ED on  09/17/2017 for hematuria, vaginal d/c. Her UA was suspicious for a UTI and she was discharged on Augment. However, her urine culture returned today and has intermediate sensitivity to Augmentin. After discussion with Dr Mable Paris and a review of her chart he has decided to treat with cephalexin 500mg  po twice daily for 7 days. I have discussed this also with [name] is aware and agrees to the treatment plan.  Plan: I have called in Keflex 500mg  po BID x7 days to [place], RPh at [facility] on [address].    Height: 5\' 7"  (170.2 cm) Weight: 215 lb (97.5 kg) IBW/kg (Calculated) : 61.6  Thank you for allowing pharmacy to be a part of this patient's care.  Dallie Piles, PharmD 09/20/2017 1:57 PM

## 2017-10-30 ENCOUNTER — Ambulatory Visit (INDEPENDENT_AMBULATORY_CARE_PROVIDER_SITE_OTHER): Payer: Commercial Managed Care - PPO | Admitting: Family Medicine

## 2017-10-30 ENCOUNTER — Encounter: Payer: Self-pay | Admitting: Family Medicine

## 2017-10-30 VITALS — BP 130/68 | HR 66 | Temp 98.5°F | Resp 16 | Ht 67.0 in | Wt 221.2 lb

## 2017-10-30 DIAGNOSIS — G5711 Meralgia paresthetica, right lower limb: Secondary | ICD-10-CM

## 2017-10-30 DIAGNOSIS — Z1231 Encounter for screening mammogram for malignant neoplasm of breast: Secondary | ICD-10-CM | POA: Diagnosis not present

## 2017-10-30 DIAGNOSIS — Z23 Encounter for immunization: Secondary | ICD-10-CM

## 2017-10-30 DIAGNOSIS — E785 Hyperlipidemia, unspecified: Secondary | ICD-10-CM

## 2017-10-30 DIAGNOSIS — Z0000 Encounter for general adult medical examination without abnormal findings: Secondary | ICD-10-CM

## 2017-10-30 DIAGNOSIS — R319 Hematuria, unspecified: Secondary | ICD-10-CM | POA: Diagnosis not present

## 2017-10-30 DIAGNOSIS — Z13820 Encounter for screening for osteoporosis: Secondary | ICD-10-CM

## 2017-10-30 DIAGNOSIS — K1370 Unspecified lesions of oral mucosa: Secondary | ICD-10-CM

## 2017-10-30 DIAGNOSIS — E1129 Type 2 diabetes mellitus with other diabetic kidney complication: Secondary | ICD-10-CM

## 2017-10-30 DIAGNOSIS — R809 Proteinuria, unspecified: Secondary | ICD-10-CM

## 2017-10-30 DIAGNOSIS — Z1239 Encounter for other screening for malignant neoplasm of breast: Secondary | ICD-10-CM

## 2017-10-30 LAB — POCT URINALYSIS DIPSTICK
BILIRUBIN UA: NEGATIVE
Glucose, UA: NEGATIVE
KETONES UA: NEGATIVE
Nitrite, UA: NEGATIVE
Protein, UA: POSITIVE — AB
Spec Grav, UA: 1.015 (ref 1.010–1.025)
Urobilinogen, UA: 0.2 E.U./dL
pH, UA: 6.5 (ref 5.0–8.0)

## 2017-10-30 MED ORDER — PREGABALIN 100 MG PO CAPS: 100.0000 mg | ORAL_CAPSULE | Freq: Two times a day (BID) | ORAL | 0 refills | Status: DC

## 2017-10-30 NOTE — Patient Instructions (Signed)
Preventive Care 40-64 Years, Female Preventive care refers to lifestyle choices and visits with your health care provider that can promote health and wellness. What does preventive care include?  A yearly physical exam. This is also called an annual well check.  Dental exams once or twice a year.  Routine eye exams. Ask your health care provider how often you should have your eyes checked.  Personal lifestyle choices, including: ? Daily care of your teeth and gums. ? Regular physical activity. ? Eating a healthy diet. ? Avoiding tobacco and drug use. ? Limiting alcohol use. ? Practicing safe sex. ? Taking low-dose aspirin daily starting at age 58. ? Taking vitamin and mineral supplements as recommended by your health care provider. What happens during an annual well check? The services and screenings done by your health care provider during your annual well check will depend on your age, overall health, lifestyle risk factors, and family history of disease. Counseling Your health care provider may ask you questions about your:  Alcohol use.  Tobacco use.  Drug use.  Emotional well-being.  Home and relationship well-being.  Sexual activity.  Eating habits.  Work and work Statistician.  Method of birth control.  Menstrual cycle.  Pregnancy history.  Screening You may have the following tests or measurements:  Height, weight, and BMI.  Blood pressure.  Lipid and cholesterol levels. These may be checked every 5 years, or more frequently if you are over 81 years old.  Skin check.  Lung cancer screening. You may have this screening every year starting at age 78 if you have a 30-pack-year history of smoking and currently smoke or have quit within the past 15 years.  Fecal occult blood test (FOBT) of the stool. You may have this test every year starting at age 65.  Flexible sigmoidoscopy or colonoscopy. You may have a sigmoidoscopy every 5 years or a colonoscopy  every 10 years starting at age 30.  Hepatitis C blood test.  Hepatitis B blood test.  Sexually transmitted disease (STD) testing.  Diabetes screening. This is done by checking your blood sugar (glucose) after you have not eaten for a while (fasting). You may have this done every 1-3 years.  Mammogram. This may be done every 1-2 years. Talk to your health care provider about when you should start having regular mammograms. This may depend on whether you have a family history of breast cancer.  BRCA-related cancer screening. This may be done if you have a family history of breast, ovarian, tubal, or peritoneal cancers.  Pelvic exam and Pap test. This may be done every 3 years starting at age 80. Starting at age 36, this may be done every 5 years if you have a Pap test in combination with an HPV test.  Bone density scan. This is done to screen for osteoporosis. You may have this scan if you are at high risk for osteoporosis.  Discuss your test results, treatment options, and if necessary, the need for more tests with your health care provider. Vaccines Your health care provider may recommend certain vaccines, such as:  Influenza vaccine. This is recommended every year.  Tetanus, diphtheria, and acellular pertussis (Tdap, Td) vaccine. You may need a Td booster every 10 years.  Varicella vaccine. You may need this if you have not been vaccinated.  Zoster vaccine. You may need this after age 5.  Measles, mumps, and rubella (MMR) vaccine. You may need at least one dose of MMR if you were born in  1957 or later. You may also need a second dose.  Pneumococcal 13-valent conjugate (PCV13) vaccine. You may need this if you have certain conditions and were not previously vaccinated.  Pneumococcal polysaccharide (PPSV23) vaccine. You may need one or two doses if you smoke cigarettes or if you have certain conditions.  Meningococcal vaccine. You may need this if you have certain  conditions.  Hepatitis A vaccine. You may need this if you have certain conditions or if you travel or work in places where you may be exposed to hepatitis A.  Hepatitis B vaccine. You may need this if you have certain conditions or if you travel or work in places where you may be exposed to hepatitis B.  Haemophilus influenzae type b (Hib) vaccine. You may need this if you have certain conditions.  Talk to your health care provider about which screenings and vaccines you need and how often you need them. This information is not intended to replace advice given to you by your health care provider. Make sure you discuss any questions you have with your health care provider. Document Released: 02/27/2015 Document Revised: 10/21/2015 Document Reviewed: 12/02/2014 Elsevier Interactive Patient Education  2018 Elsevier Inc.  

## 2017-10-30 NOTE — Progress Notes (Signed)
Name: Cheryl Barr   MRN: [id number]    DOB: 1955/12/16   Date:10/30/2017       Progress Note  Subjective  Chief Complaint  Chief Complaint  Patient presents with  . Annual Exam    HPI   Patient presents for annual CPE.  Meralgia Paresthetica of the right thigh - she notes ongoing pain, has tried gabapentin in the past and it did not work.  She is taking lyrica 25m BID and asks if she can increase the dose.  USPSTF grade A and B recommendations    [facility]-C Score  0     Depression:  [facility] 10/30/2017 08/23/2017 05/22/2017 02/20/2017 10/26/2016  Decreased Interest 0 0 0 0 0  Down, Depressed, Hopeless 0 0 0 0 0  PHQ - 2 Score 0 0 0 0 0  Altered sleeping 0 - - - -  Tired, decreased energy 0 - - - -  Change in appetite 0 - - - -  Feeling bad or failure about yourself  0 - - - -  Trouble concentrating 0 - - - -  Moving slowly or fidgety/restless 0 - - - -  Suicidal thoughts 0 - - - -  PHQ-9 Score 0 - - - -  Difficult doing work/chores Not difficult at all - - - -   Hypertension: BP Readings from Last 3 Encounters:  10/30/17 130/68  09/17/17 (!) 150/85  08/23/17 118/78   Obesity: She is walking at work a lot -works as a wEducational psychologistat [employer] eats out a lot 3-4 times a week [place]  She does not want to change how many times she eats out.  She does state that she will try to change what she eats and how much she eats when eating out.  Wt Readings from Last 3 Encounters:  10/30/17 221 lb 3.2 oz (100.3 kg)  09/17/17 215 lb (97.5 kg)  08/23/17 219 lb 14.4 oz (99.7 kg)   BMI Readings from Last 3 Encounters:  10/30/17 34.64 kg/m  09/17/17 33.67 kg/m  08/23/17 34.44 kg/m    Hep C Screening: UTD STD testing and prevention (HIV/chl/gon/syphilis): Last Pap 2018 - negative for gonorrhea/chlamydia; HIV negative 2017.  Intimate partner violence: No  concerns Sexual History/Pain during Intercourse: Denies pain with intercourse; she did have a UTI in August 2019 - she did have blood in urine during her UTI - she notes a history of microscopic hematuria that she had checked by Urology and was cleared.  She would like a recheck today to see if hematuria has resolved.  Menstrual History/LMP/Abnormal Bleeding: No vaginal bleeding; postmenopausal Incontinence Symptoms: No concerns  Advanced Care Planning: A voluntary discussion about advance care planning including the explanation and discussion of advance directives.  Discussed health care proxy and Living will, and the patient was able to identify a health care proxy as SMaple Hudson(Daughter).  Patient does not have a living will at present time. If patient does have living will, I have requested they bring this to the clinic to be scanned in to their chart.  Breast cancer: We will order today; had a lump removed in the past; Father had breast cancer HM Mammogram  Date Value Ref Range Status  04/23/2013   Final   category zero, needs added views for possible mass on left breast.     BRCA gene screening: She had genetic testing in the  past and it was negative. Cervical cancer screening: 10/26/2016  Osteoporosis Screening:  HM Dexa Scan  Date Value Ref Range Status  03/17/2010 Normal  Final    Lipids:  Lab Results  Component Value Date   CHOL 140 02/22/2017   CHOL 122 04/13/2016   CHOL 141 06/05/2015   Lab Results  Component Value Date   HDL 48 (L) 02/22/2017   HDL 49 (L) 04/13/2016   HDL 41 06/05/2015   Lab Results  Component Value Date   LDLCALC 68 02/22/2017   LDLCALC 56 04/13/2016   LDLCALC 64 06/05/2015   Lab Results  Component Value Date   TRIG 153 (H) 02/22/2017   TRIG 84 04/13/2016   TRIG 182 (H) 06/05/2015   Lab Results  Component Value Date   CHOLHDL 2.9 02/22/2017   CHOLHDL 2.5 04/13/2016   CHOLHDL 3.4 06/05/2015   No results found for:  LDLDIRECT  Glucose:  Glucose  Date Value Ref Range Status  06/17/2013 137 (H) 65 - 99 mg/dL Final   Glucose, Bld  Date Value Ref Range Status  02/22/2017 118 (H) 65 - 99 mg/dL Final    Comment:    .            Fasting reference interval . For someone without known diabetes, a glucose value between 100 and 125 mg/dL is consistent with prediabetes and should be confirmed with a follow-up test. .   04/13/2016 103 (H) 65 - 99 mg/dL Final  06/05/2015 106 (H) 65 - 99 mg/dL Final   Glucose-Capillary  Date Value Ref Range Status  10/09/2014 148 (H) 65 - 99 mg/dL Final  10/09/2014 126 (H) 65 - 99 mg/dL Final    Skin cancer: No concerning moles or lesions; discussed red flags for skin lesions Colorectal cancer: Did cologuard in January 2019 - negative; no abdominal pain, no changes in BM's/ Lung cancer:  Low Dose CT Chest recommended if Age 66-80 years, 30 pack-year currently smoking OR have quit w/in 15years. Patient does not qualify.   ECG: On file; no chest pain/shortness of breath  Patient Active Problem List   Diagnosis Date Noted  . Incomplete rotator cuff tear or rupture of left shoulder, not specified as traumatic 07/26/2016  . Thoracic aortic atherosclerosis ([place]) 01/12/2016  . 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 B12 deficiency 01/11/2016  . Hernia of abdominal cavity 09/15/2014  . Impaired renal function 08/10/2014  . 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 Dyslipidemia 08/10/2014  . Interval gout 08/10/2014  . Microalbuminuria 08/10/2014  . Morbid obesity ([place]) 08/10/2014  . Osteoarthritis of left knee 08/10/2014  . Vitamin D deficiency 08/10/2014  . Cough 01/21/2014  . Intraductal papilloma of breast 06/27/2013  . Hypertension, essential, benign 09/02/2009  . ABNORMAL EKG 09/02/2009    Past Surgical History:  Procedure Laterality Date  . BREAST BIOPSY Left 05/24/2013   neg  . BREAST EXCISIONAL BIOPSY Left  06/21/2013   neg/ dr. Bary Castilla  . BREAST SURGERY Left 2015  . COLONOSCOPY  2009   1 polyp found. Dr. Roselyn Reef  . HERNIA REPAIR  67/20/9470   Ventral/umbilical hernia repair with 10 x 10 cm Atrium mesh in preperitoneal position.  . INSERTION OF MESH N/A 10/09/2014   Procedure: INSERTION OF MESH;  Surgeon: Robert Bellow, MD;  Location: [facility] ORS;  Service: General;  Laterality: N/A;  . KNEE SURGERY Left 01/14/2009  . STRABISMUS SURGERY     Repair as a child  .  TUBAL LIGATION  1990  . VENTRAL HERNIA REPAIR N/A 10/09/2014   Procedure: HERNIA REPAIR VENTRAL ADULT;  Surgeon: Robert Bellow, MD;  Location: [facility] ORS;  Service: General;  Laterality: N/A;    Family History  Problem Relation Age of Onset  . Hypertension Mother   . Diabetes Mother   . Heart attack Father   . Coronary artery disease Father   . Breast cancer Father 47  . Allergies Son        Barista  . Diabetes Sister   . Diabetes Brother   . Gout Brother     Social History   Socioeconomic History  . Marital status: Widowed    Spouse name: Not on file  . Number of children: 3  . Years of education: 12th Grade  . Highest education level: Not on file  Occupational History    Employer: [employer]  Social Needs  . Financial resource strain: Not hard at all  . Food insecurity:    Worry: Never true    Inability: Never true  . Transportation needs:    Medical: No    Non-medical: No  Tobacco Use  . Smoking status: Former Smoker    Types: Cigarettes    Last attempt to quit: 10/02/1983    Years since quitting: 34.1  . Smokeless tobacco: Never Used  Substance and Sexual Activity  . Alcohol use: No    Alcohol/week: 0.0 standard drinks  . Drug use: No  . Sexual activity: Yes    Partners: Male    Birth control/protection: None  Lifestyle  . Physical activity:    Days per week: 3 days    Minutes per session: 60 min  . Stress: Only a little  Relationships  . Social connections:    Talks on  phone: More than three times a week    Gets together: More than three times a week    Attends religious service: More than 4 times per year    Active member of club or organization: Yes    Attends meetings of clubs or organizations: More than 4 times per year    Relationship status: Widowed  . Intimate partner violence:    Fear of current or ex partner: No    Emotionally abused: No    Physically abused: No    Forced sexual activity: No  Other Topics Concern  . Not on file  Social History Narrative   Widowed.    No regular exercise.     Current Outpatient Medications:  .  allopurinol (ZYLOPRIM) 100 MG tablet, Take 1 tablet (100 mg total) by mouth daily., Disp: 90 tablet, Rfl: 1 .  amLODipine-olmesartan (AZOR) 10-40 MG tablet, Take 1 tablet by mouth daily., Disp: 90 tablet, Rfl: 1 .  ammonium lactate (LAC-HYDRIN) 12 % cream, Apply topically as needed for dry skin., Disp: 385 g, Rfl: 0 .  aspirin 81 MG EC tablet, Take 81 mg by mouth daily.  , Disp: , Rfl:  .  atorvastatin (LIPITOR) 40 MG tablet, Take 1 tablet (40 mg total) by mouth daily., Disp: 90 tablet, Rfl: 1 .  carvedilol (COREG) 12.5 MG tablet, Take 1 tablet (12.5 mg total) by mouth 2 (two) times daily with a meal., Disp: 180 tablet, Rfl: 1 .  Cyanocobalamin 500 MCG SUBL, Place 1 tablet under the tongue daily., Disp: , Rfl:  .  fluticasone (FLONASE) 50 MCG/ACT nasal spray, Place 2 sprays into both nostrils daily., Disp: 16 g, Rfl: 6 .  glucose blood (FREESTYLE LITE) test strip, FREESTYLE LITE TEST (In Vitro Strip)  check fsbs once a day for 0 days  Quantity: 100.00;  Refills: 5   Ordered :31-Dec-2009  Steele Sizer MD;  Dani Gobble Active Comments: DX: 250.00, Disp: , Rfl:  .  LANCETS ULTRA THIN 30G MISC, , Disp: , Rfl:  .  metFORMIN (GLUCOPHAGE-XR) 750 MG 24 hr tablet, Take 1 tablet (750 mg total) by mouth daily with breakfast., Disp: 90 tablet, Rfl: 1 .  amoxicillin-clavulanate (AUGMENTIN) 875-125 MG tablet, Take 1  tablet by mouth 2 (two) times daily. (Patient not taking: Reported on 10/30/2017), Disp: 13 tablet, Rfl: 0 .  pregabalin (LYRICA) 100 MG capsule, Take 1 capsule (100 mg total) by mouth 2 (two) times daily., Disp: 180 capsule, Rfl: 0  No Known Allergies   ROS  Constitutional: Negative for fever or weight change.  Respiratory: Negative for cough and shortness of breath.   Cardiovascular: Negative for chest pain or palpitations.  Gastrointestinal: Negative for abdominal pain, no bowel changes.  Musculoskeletal: Negative for gait problem or joint swelling.  Skin: Negative for rash.  Neurological: Negative for dizziness or headache.  No other specific complaints in a complete review of systems (except as listed in HPI above).  Objective  Vitals:   10/30/17 0804  BP: 130/68  Pulse: 66  Resp: 16  Temp: 98.5 F (36.9 C)  TempSrc: Oral  SpO2: 97%  Weight: 221 lb 3.2 oz (100.3 kg)  Height: '5\' 7"'  (1.702 m)    Body mass index is 34.64 kg/m.  Physical Exam Constitutional: Patient appears well-developed and well-nourished. No distress.  HENT: Head: Normocephalic and atraumatic. Ears: B TMs ok, no erythema or effusion; Nose: Nose normal. Mouth/Throat: Oropharynx - hard palate exhibits pencil-eraser sized white lesion with very slight underlying erythema. No oropharyngeal exudate.  Eyes: Conjunctivae and EOM are normal. Pupils are equal, round, and reactive to light. No scleral icterus.  Neck: Normal range of motion. Neck supple. No JVD present. No thyromegaly present.  Cardiovascular: Normal rate, regular rhythm and normal heart sounds.  No murmur heard. No BLE edema. Pulmonary/Chest: Effort normal and breath sounds normal. No respiratory distress. Abdominal: Soft. Bowel sounds are normal, no distension. There is no tenderness. no masses Breast: no lumps or masses, no nipple discharge or rashes FEMALE GENITALIA: Deferred Musculoskeletal: Normal range of motion, no joint effusions. No  gross deformities Neurological: he is alert and oriented to person, place, and time. No cranial nerve deficit. Coordination, balance, strength, speech and gait are normal.  Skin: Skin is warm and dry. No rash noted. No erythema.  Psychiatric: Patient has a normal mood and affect. behavior is normal. Judgment and thought content normal.  Recent Results (from the past 2160 hour(s))  POCT HgB A1C     Status: Abnormal   Collection Time: 08/23/17  1:34 PM  Result Value Ref Range   Hemoglobin A1C  4.0 - 5.6 %   HbA1c POC (<> result, manual entry)  4.0 - 5.6 %   HbA1c, POC (prediabetic range) 6.5 (A) 5.7 - 6.4 %   HbA1c, POC (controlled diabetic range)  0.0 - 7.0 %  Urinalysis, Complete w Microscopic     Status: Abnormal   Collection Time: 09/17/17  5:26 AM  Result Value Ref Range   Color, Urine YELLOW (A) YELLOW   APPearance CLOUDY (A) CLEAR   Specific Gravity, Urine 1.012 1.005 - 1.030   pH 6.0 5.0 - 8.0   Glucose, UA NEGATIVE NEGATIVE mg/dL  Hgb urine dipstick LARGE (A) NEGATIVE   Bilirubin Urine NEGATIVE NEGATIVE   Ketones, ur NEGATIVE NEGATIVE mg/dL   Protein, ur 100 (A) NEGATIVE mg/dL   Nitrite POSITIVE (A) NEGATIVE   Leukocytes, UA LARGE (A) NEGATIVE   RBC / HPF >50 (H) 0 - 5 RBC/hpf   WBC, UA >50 (H) 0 - 5 WBC/hpf   Bacteria, UA FEW (A) NONE SEEN   Squamous Epithelial / LPF 0-5 0 - 5   WBC Clumps PRESENT     Comment: Performed at [facility], [address], [place], [place] [address]  Urine Culture     Status: Abnormal   Collection Time: 09/17/17  5:26 AM  Result Value Ref Range   Specimen Description      URINE, RANDOM Performed at [facility], [address] [place], [place] [address]    Special Requests      Normal Performed at [facility], [address] [place], [place] [address]    Culture >=100,000 COLONIES/mL ESCHERICHIA COLI (A)    Report Status 09/19/2017 FINAL    Organism ID, Bacteria ESCHERICHIA COLI (A)        Susceptibility   Escherichia coli - MIC*    AMPICILLIN >=32 RESISTANT Resistant     CEFAZOLIN <=4 SENSITIVE Sensitive     CEFTRIAXONE <=1 SENSITIVE Sensitive     CIPROFLOXACIN >=4 RESISTANT Resistant     GENTAMICIN <=1 SENSITIVE Sensitive     IMIPENEM 1 SENSITIVE Sensitive     NITROFURANTOIN <=16 SENSITIVE Sensitive     TRIMETH/SULFA >=320 RESISTANT Resistant     AMPICILLIN/SULBACTAM 16 INTERMEDIATE Intermediate     PIP/TAZO <=4 SENSITIVE Sensitive     Extended ESBL NEGATIVE Sensitive     * >=100,000 COLONIES/mL ESCHERICHIA COLI  Wet prep, genital     Status: Abnormal   Collection Time: 09/17/17  7:26 AM  Result Value Ref Range   Yeast Wet Prep HPF POC NONE SEEN NONE SEEN   Trich, Wet Prep PRESENT (A) NONE SEEN   Clue Cells Wet Prep HPF POC PRESENT (A) NONE SEEN   WBC, Wet Prep HPF POC MANY (A) NONE SEEN   Sperm NONE SEEN     Comment: Performed at [facility], [address] [place], [place] [address]  POCT urinalysis dipstick     Status: Abnormal   Collection Time: 10/30/17  8:53 AM  Result Value Ref Range   Color, UA yellow    Clarity, UA clear    Glucose, UA Negative Negative   Bilirubin, UA negative    Ketones, UA negative    Spec Grav, UA 1.015 1.010 - 1.025   Blood, UA moderate    pH, UA 6.5 5.0 - 8.0   Protein, UA Positive (A) Negative   Urobilinogen, UA 0.2 0.2 or 1.0 E.U./dL   Nitrite, UA negative    Leukocytes, UA Large (3+) (A) Negative   Appearance clear    Odor none    PHQ2/9: Depression screen [facility] 2/9 10/30/2017 08/23/2017 05/22/2017 02/20/2017 10/26/2016  Decreased Interest 0 0 0 0 0  Down, Depressed, Hopeless 0 0 0 0 0  PHQ - 2 Score 0 0 0 0 0  Altered sleeping 0 - - - -  Tired, decreased energy 0 - - - -  Change in appetite 0 - - - -  Feeling bad or failure about yourself  0 - - - -  Trouble concentrating 0 - - - -  Moving slowly or fidgety/restless 0 - - - -  Suicidal thoughts 0 - - - -  PHQ-9 Score 0 - - - -  Difficult doing work/chores  Not difficult at all - - - -   Fall Risk: Fall Risk  10/30/2017 08/23/2017 05/22/2017 02/20/2017 10/26/2016  Falls in the past year? No No No No No   Assessment & Plan  1. Well woman exam (no gynecological exam) -USPSTF grade A and B recommendations reviewed with patient; age-appropriate recommendations, preventive care, screening tests, etc discussed and encouraged; healthy living encouraged; see AVS for patient education given to patient -Discussed importance of 150 minutes of physical activity weekly, eat two servings of fish weekly, eat one serving of tree nuts ( cashews, pistachios, pecans, almonds.Marland Kitchen) every other day, eat 6 servings of fruit/vegetables daily and drink plenty of water and avoid sweet beverages.  - MM DIGITAL SCREENING BILATERAL; Future  2. Breast cancer screening - MM DIGITAL SCREENING BILATERAL; Future  3. Needs flu shot Declines - will get for free at work  4. Morbid obesity, unspecified obesity type ([place]) - See above regarding health teaching; will try to decrease unhealthy food choices while eating out.  Continue to walk daily - Lipid panel  5. Controlled type 2 diabetes mellitus with microalbuminuria, without long-term current use of insulin (HCC) - Not due for A1C; will follow up in 1 month with PCP Dr. Ancil Boozer for DM follow up. - COMPLETE METABOLIC PANEL WITH GFR  6. Dyslipidemia - Lipid panel - Continue atorvastatin  7. Meralgia paresthetica of right side - pregabalin (LYRICA) 100 MG capsule; Take 1 capsule (100 mg total) by mouth 2 (two) times daily.  Dispense: 180 capsule; Refill: 0  8. Hematuria, unspecified type - POCT urinalysis dipstick - +Leuks and Blood - we will send for culture per orders; advised if culture is negative, I do recommend urology referral to further evaluation cause of ongoing hematuria.  9. Osteoporosis screening - HM DEXA SCAN  10. Oral lesion - Pt reports has been present for about a month; no pain to the site. Discussed  monitoring vs referral. Discussed with Dr. Ancil Boozer.  We will refer to ENT per orders.

## 2017-10-31 LAB — COMPLETE METABOLIC PANEL WITH GFR
AG RATIO: 1.2 (calc) (ref 1.0–2.5)
ALBUMIN MSPROF: 4.1 g/dL (ref 3.6–5.1)
ALT: 15 U/L (ref 6–29)
AST: 13 U/L (ref 10–35)
Alkaline phosphatase (APISO): 87 U/L (ref 33–130)
BILIRUBIN TOTAL: 0.3 mg/dL (ref 0.2–1.2)
BUN / CREAT RATIO: 20 (calc) (ref 6–22)
BUN: 22 mg/dL (ref 7–25)
CO2: 28 mmol/L (ref 20–32)
Calcium: 9.6 mg/dL (ref 8.6–10.4)
Chloride: 106 mmol/L (ref 98–110)
Creat: 1.1 mg/dL — ABNORMAL HIGH (ref 0.50–0.99)
GFR, EST AFRICAN AMERICAN: 62 mL/min/{1.73_m2} (ref >=60)
GFR, EST NON AFRICAN AMERICAN: 54 mL/min/{1.73_m2} — AB (ref >=60)
GLUCOSE: 122 mg/dL — AB (ref 65–99)
Globulin: 3.4 g/dL (calc) (ref 1.9–3.7)
POTASSIUM: 4.1 mmol/L (ref 3.5–5.3)
Sodium: 145 mmol/L (ref 135–146)
TOTAL PROTEIN: 7.5 g/dL (ref 6.1–8.1)

## 2017-10-31 LAB — LIPID PANEL
Cholesterol: 125 mg/dL (ref <200)
HDL: 45 mg/dL — ABNORMAL LOW (ref >50)
LDL Cholesterol (Calc): 60 mg/dL (calc)
NON-HDL CHOLESTEROL (CALC): 80 mg/dL (ref <130)
TRIGLYCERIDES: 115 mg/dL (ref <150)
Total CHOL/HDL Ratio: 2.8 (calc) (ref <5.0)

## 2017-10-31 LAB — URINE CULTURE
MICRO NUMBER:: 91109914
RESULT: NO GROWTH
SPECIMEN QUALITY:: ADEQUATE

## 2017-11-01 ENCOUNTER — Other Ambulatory Visit: Payer: Self-pay | Admitting: Family Medicine

## 2017-11-01 DIAGNOSIS — R319 Hematuria, unspecified: Secondary | ICD-10-CM

## 2017-11-28 ENCOUNTER — Ambulatory Visit (INDEPENDENT_AMBULATORY_CARE_PROVIDER_SITE_OTHER): Payer: Commercial Managed Care - PPO | Admitting: Urology

## 2017-11-28 ENCOUNTER — Other Ambulatory Visit: Payer: Self-pay

## 2017-11-28 ENCOUNTER — Encounter: Payer: Self-pay | Admitting: Urology

## 2017-11-28 VITALS — BP 130/76 | HR 70 | Ht 67.0 in | Wt 220.0 lb

## 2017-11-28 DIAGNOSIS — R3129 Other microscopic hematuria: Secondary | ICD-10-CM

## 2017-11-28 LAB — URINALYSIS, COMPLETE
BILIRUBIN UA: NEGATIVE
GLUCOSE, UA: NEGATIVE
Nitrite, UA: NEGATIVE
PH UA: 5.5 (ref 5.0–7.5)
Urobilinogen, Ur: 0.2 mg/dL (ref 0.2–1.0)

## 2017-11-28 LAB — MICROSCOPIC EXAMINATION

## 2017-11-28 NOTE — Progress Notes (Signed)
11/28/2017 4:12 PM   Cheryl Barr 09/06/1955 [id number]  Referring provider: Steele Sizer, MD [address] [place], [place] [address]  CC: Microscopic hematuria  HPI: I had the pleasure of seeing Cheryl Barr in urology clinic today in consultation for microscopic hematuria from Cheryl Ensign, NP.  She is a 62 year old African-American female with diabetes who was found to have microscopic hematuria around the time of a urinary tract infection.  She had a symptomatic E. coli UTI in August 2019, and repeat urinalysis in September 2019 showed "moderate" RBCs.  She denies any urinary symptoms at this time.  There is no family history of bladder cancer.  She has a distant history of smoking for approximately 6 months, but quit over 40 years ago.  She does not have any exposure to bladder carcinogens.  She denies any gross hematuria.   PMH: Past Medical History:  Diagnosis Date  . Arthritis   . Colon polyp 2009  . Depression   . Diabetes mellitus    Type 2  . Fibromyalgia   . Gout   . Hyperlipidemia   . Hypertension   . Insomnia   . Menopause   . Vitamin D deficiency     Surgical History: Past Surgical History:  Procedure Laterality Date  . BREAST BIOPSY Left 05/24/2013   neg  . BREAST EXCISIONAL BIOPSY Left 06/21/2013   neg/ dr. Bary Castilla  . BREAST SURGERY Left 2015  . COLONOSCOPY  2009   1 polyp found. Dr. Roselyn Reef  . HERNIA REPAIR  [id number]   Ventral/umbilical hernia repair with 10 x 10 cm Atrium mesh in preperitoneal position.  . INSERTION OF MESH N/A 10/09/2014   Procedure: INSERTION OF MESH;  Surgeon: Robert Bellow, MD;  Location: [facility] ORS;  Service: General;  Laterality: N/A;  . KNEE SURGERY Left 01/14/2009  . STRABISMUS SURGERY     Repair as a child  . TUBAL LIGATION  1990  . VENTRAL HERNIA REPAIR N/A 10/09/2014   Procedure: HERNIA REPAIR VENTRAL ADULT;  Surgeon: Robert Bellow, MD;  Location: [facility] ORS;  Service: General;  Laterality: N/A;     Allergies: No Known Allergies  Family History: Family History  Problem Relation Age of Onset  . Hypertension Mother   . Diabetes Mother   . Heart attack Father   . Coronary artery disease Father   . Breast cancer Father 30  . Allergies Son        Barista  . Diabetes Sister   . Diabetes Brother   . Gout Brother     Social History:  reports that she quit smoking about 34 years ago. Her smoking use included cigarettes. She has never used smokeless tobacco. She reports that she does not drink alcohol or use drugs.  ROS: Please see flowsheet from today's date for complete review of systems.  Physical Exam: BP 130/76   Pulse 70   Ht 5\' 7"  (1.702 m)   Wt 220 lb (99.8 kg)   BMI 34.46 kg/m    Constitutional:  Alert and oriented, No acute distress. Cardiovascular: No clubbing, cyanosis, or edema. Respiratory: Normal respiratory effort, no increased work of breathing. GI: Abdomen is soft, nontender, nondistended, no abdominal masses GU: No CVA tenderness Lymph: No cervical or inguinal lymphadenopathy. Skin: No rashes, bruises or suspicious lesions. Neurologic: Grossly intact, no focal deficits, moving all 4 extremities. Psychiatric: Normal mood and affect.  Laboratory Data: Urinalysis today 6-10 WBCs, 0-2 RBCs, many bacteria, nitrite negative  Pertinent Imaging: None to review  Assessment & Plan:   In summary, Cheryl Barr is a 62 year old African-American female who had "moderate" RBCs on a urinalysis around the time of urinary tract infection.  Urinalysis today does not demonstrate any microscopic hematuria.  We discussed at length options moving forward including complete hematuria work-up with CT urogram and cystoscopy, versus repeat urinalysis in 3 months.  I feel that with her minimal risk factors, and the fact that this urinalysis was taken around the time of a symptomatic urinary infection, it is reasonable to defer further work-up at this time.  We  discussed the very small, but not 0, risk of missing possible urologic malignancy or other pathology by deferring work-up.  Repeat urinalysis in 3 months, pursue hematuria work-up at that time if hematuria present Follow-up in 1 year with PCP for urinalysis  Billey Co, MD  [facility] [address] [place], [place] [address] [phone]

## 2017-12-01 ENCOUNTER — Ambulatory Visit: Payer: Commercial Managed Care - PPO | Admitting: Family Medicine

## 2017-12-01 ENCOUNTER — Encounter: Payer: Self-pay | Admitting: Family Medicine

## 2017-12-01 ENCOUNTER — Other Ambulatory Visit (HOSPITAL_COMMUNITY)
Admission: RE | Admit: 2017-12-01 | Discharge: 2017-12-01 | Disposition: A | Discharge status: Home / Self Care | Payer: Commercial Managed Care - PPO | Source: Ambulatory Visit | Attending: Family Medicine | Admitting: Family Medicine

## 2017-12-01 VITALS — BP 138/78 | HR 82 | Temp 98.1°F | Resp 16 | Ht 67.0 in | Wt 219.7 lb

## 2017-12-01 DIAGNOSIS — M109 Gout, unspecified: Secondary | ICD-10-CM | POA: Diagnosis not present

## 2017-12-01 DIAGNOSIS — Z6835 Body mass index (BMI) 35.0-35.9, adult: Secondary | ICD-10-CM | POA: Diagnosis not present

## 2017-12-01 DIAGNOSIS — A599 Trichomoniasis, unspecified: Secondary | ICD-10-CM | POA: Diagnosis not present

## 2017-12-01 DIAGNOSIS — N898 Other specified noninflammatory disorders of vagina: Secondary | ICD-10-CM | POA: Insufficient documentation

## 2017-12-01 DIAGNOSIS — Z87891 Personal history of nicotine dependence: Secondary | ICD-10-CM | POA: Diagnosis not present

## 2017-12-01 DIAGNOSIS — E1129 Type 2 diabetes mellitus with other diabetic kidney complication: Secondary | ICD-10-CM

## 2017-12-01 DIAGNOSIS — Z113 Encounter for screening for infections with a predominantly sexual mode of transmission: Secondary | ICD-10-CM

## 2017-12-01 DIAGNOSIS — E785 Hyperlipidemia, unspecified: Secondary | ICD-10-CM

## 2017-12-01 DIAGNOSIS — Z7951 Long term (current) use of inhaled steroids: Secondary | ICD-10-CM | POA: Diagnosis not present

## 2017-12-01 DIAGNOSIS — R809 Proteinuria, unspecified: Secondary | ICD-10-CM

## 2017-12-01 DIAGNOSIS — Z79899 Other long term (current) drug therapy: Secondary | ICD-10-CM | POA: Insufficient documentation

## 2017-12-01 DIAGNOSIS — D638 Anemia in other chronic diseases classified elsewhere: Secondary | ICD-10-CM | POA: Insufficient documentation

## 2017-12-01 DIAGNOSIS — G4711 Idiopathic hypersomnia with long sleep time: Secondary | ICD-10-CM | POA: Diagnosis not present

## 2017-12-01 DIAGNOSIS — G5711 Meralgia paresthetica, right lower limb: Secondary | ICD-10-CM | POA: Diagnosis not present

## 2017-12-01 MED ORDER — PREGABALIN 100 MG PO CAPS: 100.0000 mg | ORAL_CAPSULE | Freq: Two times a day (BID) | ORAL | 0 refills | Status: DC

## 2017-12-01 MED ORDER — METRONIDAZOLE 500 MG PO TABS: 1000.0000 mg | ORAL_TABLET | Freq: Two times a day (BID) | ORAL | 0 refills | Status: DC

## 2017-12-01 NOTE — Patient Instructions (Signed)

## 2017-12-01 NOTE — Progress Notes (Signed)
Name: Cheryl Barr   MRN: [id number]    DOB: 08/31/1955   Date:12/01/2017       Progress Note  Subjective  Chief Complaint  Chief Complaint  Patient presents with  . Follow-up    1 month f/u    HPI  Trichomonas infection: she had an UTI in August was treated, but a few weeks later she developed vaginal discharge, itching and occasional bleeding after intercourse. She had a repeat urine that showed protein and hematuria and was sent to Urologist. UA there showed trichomonas but she was treated for it. Discussed results. She lives with her boyfriend, discussed diagnosis and treatment, and that he needs to be treated also.   HTN: compliant with medication, Azor, and carvedilol, bp is at goal today. Shedenies side effects, no chest pain, palpitation or SOB.Off diureticsbecause of gout.  She is following a DASH, she has occasional cough, seen by pulmonologist , explained it may be secondary to ARBbut because of microalbuminuria we will continue ARB  Hyperlipidemia: last LDL was at goal, continue high dose statin therapy, denies side effects of medication and is compliant with medications, LDL was  at goal09/2019.Marland Kitchen Atherosclerosis aorta, continue statin and aspirin.  HDL was a little low, discussed importance of eating more fish, fruit, vegetables and tree nuts.   DMII with microalbuminuria: urine micro was 100 in Dec 2016 and Nov 2017 and 01/2019Taking ARB, and aspirin, also Metfomin, she has symptoms of meralgia paresthetica on right side for over one year, she is now on Lyrica and seems to help with symptoms. Eye exam is due and she will schedule a follow up. We will check A1C at lab today. Not checking sugar lately because monitor is out of battery   Gout: . She is back on Allopurinol now . Doing well at this time. No recent episodes. Last uric acid 6.2.Off HCTZ, unchanged   Obesity: weight is stable, trying to eat healthier, and she has been more active at work.   Cough:  seen by Dr. 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 She states symptomsare worsewhen she has an URI. Marland Kitchen No wheezing,she still has intermittent episodes of cough.No longer daily and does not last for hours, she has not gone back to pulmonologist. . She states medications does not work and is off inhalers.    Patient Active Problem List   Diagnosis Date Noted  . Incomplete rotator cuff tear or rupture of left shoulder, not specified as traumatic 07/26/2016  . Thoracic aortic atherosclerosis ([place]) 01/12/2016  . 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 B12 deficiency 01/11/2016  . Hernia of abdominal cavity 09/15/2014  . Impaired renal function 08/10/2014  . Controlled type 2 diabetes with renal manifestation ([place]) 08/10/2014  . Dyslipidemia 08/10/2014  . Interval gout 08/10/2014  . Microalbuminuria 08/10/2014  . Morbid obesity ([place]) 08/10/2014  . Osteoarthritis of left knee 08/10/2014  . Vitamin D deficiency 08/10/2014  . Cough 01/21/2014  . Intraductal papilloma of breast 06/27/2013  . Hypertension, essential, benign 09/02/2009  . ABNORMAL EKG 09/02/2009    Past Surgical History:  Procedure Laterality Date  . BREAST BIOPSY Left 05/24/2013   neg  . BREAST EXCISIONAL BIOPSY Left 06/21/2013   neg/ dr. Bary Castilla  . BREAST SURGERY Left 2015  . COLONOSCOPY  2009   1 polyp found. Dr. Roselyn Reef  . HERNIA REPAIR  72/10/4707   Ventral/umbilical hernia repair with 10 x 10 cm Atrium mesh in preperitoneal  position.  . INSERTION OF MESH N/A 10/09/2014   Procedure: INSERTION OF MESH;  Surgeon: Robert Bellow, MD;  Location: [facility] OR[facility];  Service: General;  Laterality: N/A;  . KNEE SURGERY Left 01/14/2009  . STRABISMUS SURGERY     Repair as a child  . TUBAL LIGATION  1990  . VENTRAL HERNIA REPAIR N/A 10/09/2014   Procedure: HERNIA REPAIR VENTRAL ADULT;  Surgeon: Robert Bellow, MD;  Location: [facility] OR[facility];  Service: General;   Laterality: N/A;    Family History  Problem Relation Age of Onset  . Hypertension Mother   . Diabetes Mother   . Heart attack Father   . Coronary artery disease Father   . Breast cancer Father 16  . Allergies Son        Barista  . Diabetes Sister   . Diabetes Brother   . Gout Brother     Social History   Socioeconomic History  . Marital status: Widowed    Spouse name: Not on file  . Number of children: 3  . Years of education: 12th Grade  . Highest education level: Not on file  Occupational History    Employer: [employer]  Social Needs  . Financial resource strain: Not hard at all  . Food insecurity:    Worry: Never true    Inability: Never true  . Transportation needs:    Medical: No    Non-medical: No  Tobacco Use  . Smoking status: Former Smoker    Types: Cigarettes    Last attempt to quit: 10/02/1983    Years since quitting: 34.1  . Smokeless tobacco: Never Used  Substance and Sexual Activity  . Alcohol use: No    Alcohol/week: 0.0 standard drinks  . Drug use: No  . Sexual activity: Yes    Partners: Male    Birth control/protection: None  Lifestyle  . Physical activity:    Days per week: 3 days    Minutes per session: 60 min  . Stress: Only a little  Relationships  . Social connections:    Talks on phone: More than three times a week    Gets together: More than three times a week    Attends religious service: More than 4 times per year    Active member of club or organization: Yes    Attends meetings of clubs or organizations: More than 4 times per year    Relationship status: Widowed  . Intimate partner violence:    Fear of current or ex partner: No    Emotionally abused: No    Physically abused: No    Forced sexual activity: No  Other Topics Concern  . Not on file  Social History Narrative   Widowed.    No regular exercise.     Current Outpatient Medications:  .  allopurinol (ZYLOPRIM) 100 MG tablet, Take 1 tablet  (100 mg total) by mouth daily., Disp: 90 tablet, Rfl: 1 .  amLODipine-olmesartan (AZOR) 10-40 MG tablet, Take 1 tablet by mouth daily., Disp: 90 tablet, Rfl: 1 .  ammonium lactate (LAC-HYDRIN) 12 % cream, Apply topically as needed for dry skin., Disp: 385 g, Rfl: 0 .  aspirin 81 MG EC tablet, Take 81 mg by mouth daily.  , Disp: , Rfl:  .  atorvastatin (LIPITOR) 40 MG tablet, Take 1 tablet (40 mg total) by mouth daily., Disp: 90 tablet, Rfl: 1 .  carvedilol (COREG) 12.5 MG tablet, Take 1 tablet (12.5 mg total)  by mouth 2 (two) times daily with a meal., Disp: 180 tablet, Rfl: 1 .  Cyanocobalamin 500 MCG SUBL, Place 1 tablet under the tongue daily., Disp: , Rfl:  .  fluticasone (FLONASE) 50 MCG/ACT nasal spray, Place 2 sprays into both nostrils daily., Disp: 16 g, Rfl: 6 .  glucose blood (FREESTYLE LITE) test strip, FREESTYLE LITE TEST (In Vitro Strip)  check fsbs once a day for 0 days  Quantity: 100.00;  Refills: 5   Ordered :31-Dec-2009  Steele Sizer MD;  Dani Gobble Active Comments: DX: 250.00, Disp: , Rfl:  .  LANCETS ULTRA THIN 30G MISC, , Disp: , Rfl:  .  metFORMIN (GLUCOPHAGE-XR) 750 MG 24 hr tablet, Take 1 tablet (750 mg total) by mouth daily with breakfast., Disp: 90 tablet, Rfl: 1 .  pregabalin (LYRICA) 100 MG capsule, Take 1 capsule (100 mg total) by mouth 2 (two) times daily., Disp: 180 capsule, Rfl: 0 .  metroNIDAZOLE (FLAGYL) 500 MG tablet, Take 2 tablets (1,000 mg total) by mouth 2 (two) times daily., Disp: 4 tablet, Rfl: 0  No Known Allergies  I personally reviewed active problem list, medication list, allergies, family history, social history with the patient/caregiver today.   ROS  Constitutional: Negative for fever or weight change.  Respiratory: Positive  for intermittent cough but no shortness of breath.   Cardiovascular: Negative for chest pain or palpitations.  Gastrointestinal: Negative for abdominal pain, no bowel changes.  Musculoskeletal: Negative for gait  problem or joint swelling.  Skin: Negative for rash.  Neurological: Negative for dizziness or headache.  No other specific complaints in a complete review of systems (except as listed in HPI above).  Objective  Vitals:   12/01/17 0822  BP: 138/78  Pulse: 82  Resp: 16  Temp: 98.1 F (36.7 C)  TempSrc: Oral  SpO2: 98%  Weight: 219 lb 11.2 oz (99.7 kg)  Height: 5\' 7"  (1.702 m)    Body mass index is 34.41 kg/m.  Physical Exam  Constitutional: Patient appears well-developed and well-nourished. Obese  No distress.  HEENT: head atraumatic, normocephalic, pupils equal and reactive to light,  neck supple, throat within normal limits Cardiovascular: Normal rate, regular rhythm and normal heart sounds.  No murmur heard. No BLE edema. Pulmonary/Chest: Effort normal and breath sounds normal. No respiratory distress. Abdominal: Soft.  There is no tenderness. Psychiatric: Patient has a normal mood and affect. behavior is normal. Judgment and thought content normal.  Recent Results (from the past 2160 hour(s))  Urinalysis, Complete w Microscopic     Status: Abnormal   Collection Time: 09/17/17  5:26 AM  Result Value Ref Range   Color, Urine YELLOW (A) YELLOW   APPearance CLOUDY (A) CLEAR   Specific Gravity, Urine 1.012 1.005 - 1.030   pH 6.0 5.0 - 8.0   Glucose, UA NEGATIVE NEGATIVE mg/dL   Hgb urine dipstick LARGE (A) NEGATIVE   Bilirubin Urine NEGATIVE NEGATIVE   Ketones, ur NEGATIVE NEGATIVE mg/dL   Protein, ur 100 (A) NEGATIVE mg/dL   Nitrite POSITIVE (A) NEGATIVE   Leukocytes, UA LARGE (A) NEGATIVE   RBC / HPF >50 (H) 0 - 5 RBC/hpf   WBC, UA >50 (H) 0 - 5 WBC/hpf   Bacteria, UA FEW (A) NONE SEEN   Squamous Epithelial / LPF 0-5 0 - 5   WBC Clumps PRESENT     Comment: Performed at [facility], [address], [place], [place] [address]  Urine Culture     Status: Abnormal  Collection Time: 09/17/17  5:26 AM  Result Value Ref Range   Specimen Description       URINE, RANDOM Performed at [facility], [address] [place], [place] [address]    Special Requests      Normal Performed at [facility], [address] [place], [place] [address]    Culture >=100,000 COLONIES/mL ESCHERICHIA COLI (A)    Report Status 09/19/2017 FINAL    Organism ID, Bacteria ESCHERICHIA COLI (A)       Susceptibility   Escherichia coli - MIC*    AMPICILLIN >=32 RESISTANT Resistant     CEFAZOLIN <=4 SENSITIVE Sensitive     CEFTRIAXONE <=1 SENSITIVE Sensitive     CIPROFLOXACIN >=4 RESISTANT Resistant     GENTAMICIN <=1 SENSITIVE Sensitive     IMIPENEM 1 SENSITIVE Sensitive     NITROFURANTOIN <=16 SENSITIVE Sensitive     TRIMETH/SULFA >=320 RESISTANT Resistant     AMPICILLIN/SULBACTAM 16 INTERMEDIATE Intermediate     PIP/TAZO <=4 SENSITIVE Sensitive     Extended ESBL NEGATIVE Sensitive     * >=100,000 COLONIES/mL ESCHERICHIA COLI  Wet prep, genital     Status: Abnormal   Collection Time: 09/17/17  7:26 AM  Result Value Ref Range   Yeast Wet Prep HPF POC NONE SEEN NONE SEEN   Trich, Wet Prep PRESENT (A) NONE SEEN   Clue Cells Wet Prep HPF POC PRESENT (A) NONE SEEN   WBC, Wet Prep HPF POC MANY (A) NONE SEEN   Sperm NONE SEEN     Comment: Performed at [facility], [address] [place], [place] [address]  POCT urinalysis dipstick     Status: Abnormal   Collection Time: 10/30/17  8:53 AM  Result Value Ref Range   Color, UA yellow    Clarity, UA clear    Glucose, UA Negative Negative   Bilirubin, UA negative    Ketones, UA negative    Spec Grav, UA 1.015 1.010 - 1.025   Blood, UA moderate    pH, UA 6.5 5.0 - 8.0   Protein, UA Positive (A) Negative   Urobilinogen, UA 0.2 0.2 or 1.0 E.U./dL   Nitrite, UA negative    Leukocytes, UA Large (3+) (A) Negative   Appearance clear    Odor none   Lipid panel     Status: Abnormal   Collection Time: 10/30/17  8:53 AM  Result Value Ref Range   Cholesterol 125 <200 mg/dL   HDL 45  (L) >50 mg/dL   Triglycerides 115 <150 mg/dL   LDL Cholesterol (Calc) 60 mg/dL (calc)    Comment: Reference range: <100 . Desirable range <100 mg/dL for primary prevention;   <70 mg/dL for patients with CHD or diabetic patients  with > or = 2 CHD risk factors. Marland Kitchen LDL-C is now calculated using the Martin-Hopkins  calculation, which is a validated novel method providing  better accuracy than the Friedewald equation in the  estimation of LDL-C.  Cresenciano Genre et al. Annamaria Helling. 7846;962(95): 2061-2068  (http://education.[facility].com/faq/FAQ164)    Total CHOL/HDL Ratio 2.8 <5.0 (calc)   Non-HDL Cholesterol (Calc) 80 <130 mg/dL (calc)    Comment: For patients with diabetes plus 1 major ASCVD risk  factor, treating to a non-HDL-C goal of <100 mg/dL  (LDL-C of <70 mg/dL) is considered a therapeutic  option.   COMPLETE METABOLIC PANEL WITH GFR     Status: Abnormal   Collection Time: 10/30/17  8:53 AM  Result Value Ref Range  Glucose, Bld 122 (H) 65 - 99 mg/dL    Comment: .            Fasting reference interval . For someone without known diabetes, a glucose value between 100 and 125 mg/dL is consistent with prediabetes and should be confirmed with a follow-up test. .    BUN 22 7 - 25 mg/dL   Creat 1.10 (H) 0.50 - 0.99 mg/dL    Comment: For patients >59 years of age, the reference limit for Creatinine is approximately 13% higher for people identified as African-American. .    GFR, Est Non African American 54 (L) > OR = 60 mL/min/1.56m2   GFR, Est African American 62 > OR = 60 mL/min/1.75m2   BUN/Creatinine Ratio 20 6 - 22 (calc)   Sodium 145 135 - 146 mmol/L   Potassium 4.1 3.5 - 5.3 mmol/L   Chloride 106 98 - 110 mmol/L   CO2 28 20 - 32 mmol/L   Calcium 9.6 8.6 - 10.4 mg/dL   Total Protein 7.5 6.1 - 8.1 g/dL   Albumin 4.1 3.6 - 5.1 g/dL   Globulin 3.4 1.9 - 3.7 g/dL (calc)   AG Ratio 1.2 1.0 - 2.5 (calc)   Total Bilirubin 0.3 0.2 - 1.2 mg/dL   Alkaline phosphatase  (APISO) 87 33 - 130 U/L   AST 13 10 - 35 U/L   ALT 15 6 - 29 U/L  Urine Culture     Status: None   Collection Time: 10/30/17  9:48 AM  Result Value Ref Range   MICRO NUMBER: [id number]    SPECIMEN QUALITY: ADEQUATE    Sample Source URINE    STATUS: FINAL    Result: No Growth   Urinalysis, Complete     Status: Abnormal   Collection Time: 11/28/17  3:10 PM  Result Value Ref Range   Specific Gravity, UA >1.030 (H) 1.005 - 1.030   pH, UA 5.5 5.0 - 7.5   Color, UA Yellow Yellow   Appearance Ur Cloudy (A) Clear   Leukocytes, UA 1+ (A) Negative   Protein, UA 3+ (A) Negative/Trace   Glucose, UA Negative Negative   Ketones, UA Trace (A) Negative   RBC, UA Trace (A) Negative   Bilirubin, UA Negative Negative   Urobilinogen, Ur 0.2 0.2 - 1.0 mg/dL   Nitrite, UA Negative Negative   Microscopic Examination See below:   Microscopic Examination     Status: Abnormal   Collection Time: 11/28/17  3:10 PM  Result Value Ref Range   WBC, UA 6-10 (A) 0 - 5 /hpf   RBC, UA 0-2 0 - 2 /hpf   Epithelial Cells (non renal) 0-10 0 - 10 /hpf   Casts Present (A) None seen /lpf   Cast Type Hyaline casts N/A   Mucus, UA Present (A) Not Estab.   Bacteria, UA Many (A) None seen/Few   Trichomonas, UA Present (A) None seen     PHQ2/9: Depression screen [facility] 2/9 12/01/2017 10/30/2017 08/23/2017 05/22/2017 02/20/2017  Decreased Interest 0 0 0 0 0  Down, Depressed, Hopeless 0 0 0 0 0  PHQ - 2 Score 0 0 0 0 0  Altered sleeping 0 0 - - -  Tired, decreased energy 0 0 - - -  Change in appetite 0 0 - - -  Feeling bad or failure about yourself  0 0 - - -  Trouble concentrating 0 0 - - -  Moving slowly or fidgety/restless 0 0 - - -  Suicidal thoughts 0 0 - - -  PHQ-9 Score 0 0 - - -  Difficult doing work/chores Not difficult at all Not difficult at all - - -     Fall Risk: Fall Risk  12/01/2017 10/30/2017 08/23/2017 05/22/2017 02/20/2017  Falls in the past year? No No No No No    Functional Status Survey: Is the  patient deaf or have difficulty hearing?: No Does the patient have difficulty seeing, even when wearing glasses/contacts?: No Does the patient have difficulty concentrating, remembering, or making decisions?: No Does the patient have difficulty walking or climbing stairs?: No Does the patient have difficulty dressing or bathing?: No Does the patient have difficulty doing errands alone such as visiting a doctor's office or shopping?: No    Assessment & Plan  1. Controlled type 2 diabetes mellitus with microalbuminuria, without long-term current use of insulin ([place])  -hgbA1C  2. Meralgia paresthetica of right side  - pregabalin (LYRICA) 100 MG capsule; Take 1 capsule (100 mg total) by mouth 2 (two) times daily.  Dispense: 180 capsule; Refill: 0  3. Obesity BMI 30-35   Discussed with the patient the risk posed by an increased BMI. Discussed importance of portion control, calorie counting and at least 150 minutes of physical activity weekly. Avoid sweet beverages and drink more water. Eat at least 6 servings of fruit and vegetables daily   4. Anemia, chronic disease  -CBC   5. Controlled gout  Doing well at this time  6. Dyslipidemia  Reviewed labs with patient   7. Vaginal discharge  - metroNIDAZOLE (FLAGYL) 500 MG tablet; Take 2 tablets (1,000 mg total) by mouth 2 (two) times daily.  Dispense: 4 tablet; Refill: 0  8. Trichomoniasis  - metroNIDAZOLE (FLAGYL) 500 MG tablet; Take 2 tablets (1,000 mg total) by mouth 2 (two) times daily.  Dispense: 4 tablet; Refill: 0  9. Routine screening for STI (sexually transmitted infection)  - HIV Antibody (routine testing w rflx) - RPR - Hepatitis, Acute

## 2017-12-01 NOTE — Addendum Note (Signed)
Addended by: Saunders Glance A on: 12/01/2017 09:14 AM   Modules accepted: Orders

## 2017-12-04 LAB — FLUORESCENT TREPONEMAL AB(FTA)-IGG-BLD: FLUORESCENT TREPONEMAL ABS: REACTIVE — AB

## 2017-12-04 LAB — HIV ANTIBODY (ROUTINE TESTING W REFLEX): HIV: NONREACTIVE

## 2017-12-04 LAB — CBC WITH DIFFERENTIAL/PLATELET
BASOS PCT: 0.5 %
Basophils Absolute: 32 cells/uL (ref 0–200)
EOS PCT: 1.4 %
Eosinophils Absolute: 90 cells/uL (ref 15–500)
HEMATOCRIT: 35.9 % (ref 35.0–45.0)
HEMOGLOBIN: 11.8 g/dL (ref 11.7–15.5)
Lymphs Abs: 1882 cells/uL (ref 850–3900)
MCH: 26 pg — ABNORMAL LOW (ref 27.0–33.0)
MCHC: 32.9 g/dL (ref 32.0–36.0)
MCV: 79.2 fL — ABNORMAL LOW (ref 80.0–100.0)
MONOS PCT: 3.5 %
MPV: 9.4 fL (ref 7.5–12.5)
NEUTROS ABS: 4173 {cells}/uL (ref 1500–7800)
Neutrophils Relative %: 65.2 %
Platelets: 332 10*3/uL (ref 140–400)
RBC: 4.53 10*6/uL (ref 3.80–5.10)
RDW: 15.8 % — ABNORMAL HIGH (ref 11.0–15.0)
Total Lymphocyte: 29.4 %
WBC mixed population: 224 cells/uL (ref 200–950)
WBC: 6.4 10*3/uL (ref 3.8–10.8)

## 2017-12-04 LAB — HEMOGLOBIN A1C
HEMOGLOBIN A1C: 6.7 %{Hb} — AB (ref <5.7)
Mean Plasma Glucose: 146 (calc)
eAG (mmol/L): 8.1 (calc)

## 2017-12-04 LAB — RPR TITER: RPR Titer: 1:4 {titer} — ABNORMAL HIGH

## 2017-12-04 LAB — URINE CYTOLOGY ANCILLARY ONLY: TRICH (WINDOWPATH): POSITIVE — AB

## 2017-12-04 LAB — HEPATITIS PANEL, ACUTE
HEP B S AG: NONREACTIVE
Hep A IgM: NONREACTIVE
Hep B C IgM: NONREACTIVE
Hepatitis C Ab: NONREACTIVE
SIGNAL TO CUT-OFF: 0.04 (ref <1.00)

## 2017-12-04 LAB — RPR: RPR Ser Ql: REACTIVE — AB

## 2017-12-07 ENCOUNTER — Ambulatory Visit (INDEPENDENT_AMBULATORY_CARE_PROVIDER_SITE_OTHER): Payer: Commercial Managed Care - PPO | Admitting: Family Medicine

## 2017-12-07 ENCOUNTER — Encounter: Payer: Self-pay | Admitting: Family Medicine

## 2017-12-07 VITALS — BP 142/96 | HR 88 | Temp 98.6°F | Resp 16 | Ht 67.0 in | Wt 208.6 lb

## 2017-12-07 DIAGNOSIS — A530 Latent syphilis, unspecified as early or late: Secondary | ICD-10-CM

## 2017-12-07 NOTE — Progress Notes (Signed)
Name: Cheryl Barr   MRN: [id number]    DOB: 62/21/1957   Date:12/07/2017       Progress Note  Subjective  Chief Complaint  Chief Complaint  Patient presents with  . Exposure to STD    Positive on trichomoniasis and syphilis    HPI  Patient came in today to discuss lab results. She was diagnosed with positive RPR , with titer or 1:4 and also positive reflex testing. She was very upset. She has been in the current relationship for 6 years, and previous with the same partner for many years. She developed vaginal itching and diagnosed with trichomonas recently and upon diagnosis we checked for other STI's. They live together. Explained to her it is a reportable disease and she needs to be treated at the health department, patient will go there today. She does not have any rashes, or genital lesions. She has developed numbness on right lateral thigh and has been taking medication for that for months ( I don't think it is caused by syphilis). 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  Patient Active Problem List   Diagnosis Date Noted  . Incomplete rotator cuff tear or rupture of left shoulder, not specified as traumatic 07/26/2016  . Thoracic aortic atherosclerosis ([place]) 01/12/2016  . 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 B12 deficiency 01/11/2016  . Hernia of abdominal cavity 09/15/2014  . Impaired renal function 08/10/2014  . 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 Dyslipidemia 08/10/2014  . Interval gout 08/10/2014  . Microalbuminuria 08/10/2014  . Morbid obesity ([place]) 08/10/2014  . Osteoarthritis of left knee 08/10/2014  . Vitamin D deficiency 08/10/2014  . Cough 01/21/2014  . Intraductal papilloma of breast 06/27/2013  . Hypertension, essential, benign 09/02/2009  . ABNORMAL EKG 09/02/2009    Past Surgical History:  Procedure Laterality Date  . BREAST BIOPSY Left 05/24/2013   neg  . BREAST EXCISIONAL BIOPSY Left 06/21/2013   neg/ dr.  Bary Castilla  . BREAST SURGERY Left 2015  . COLONOSCOPY  2009   1 polyp found. Dr. Roselyn Reef  . HERNIA REPAIR  [id number]/[id number]   Ventral/umbilical hernia repair with 10 x 10 cm Atrium mesh in preperitoneal position.  . INSERTION OF MESH N/A 10/09/2014   Procedure: INSERTION OF MESH;  Surgeon: Robert Bellow, MD;  Location: [facility] ORS;  Service: General;  Laterality: N/A;  . KNEE SURGERY Left 01/14/2009  . STRABISMUS SURGERY     Repair as a child  . TUBAL LIGATION  1990  . VENTRAL HERNIA REPAIR N/A 10/09/2014   Procedure: HERNIA REPAIR VENTRAL ADULT;  Surgeon: Robert Bellow, MD;  Location: [facility] ORS;  Service: General;  Laterality: N/A;    Family History  Problem Relation Age of Onset  . Hypertension Mother   . Diabetes Mother   . Heart attack Father   . Coronary artery disease Father   . Breast cancer Father 26  . Allergies Son        Barista  . Diabetes Sister   . Diabetes Brother   . Gout Brother     Social History   Socioeconomic History  . Marital status: Widowed    Spouse name: Not on file  . Number of children: 3  . Years of education: 12th Grade  . Highest education level: Not on file  Occupational History    Employer: [employer]  Social Needs  . Financial resource strain: Not hard at all  . Food insecurity:  Worry: Never true    Inability: Never true  . Transportation needs:    Medical: No    Non-medical: No  Tobacco Use  . Smoking status: Former Smoker    Types: Cigarettes    Last attempt to quit: 10/02/1983    Years since quitting: 34.2  . Smokeless tobacco: Never Used  Substance and Sexual Activity  . Alcohol use: No    Alcohol/week: 0.0 standard drinks  . Drug use: No  . Sexual activity: Yes    Partners: Male    Birth control/protection: None  Lifestyle  . Physical activity:    Days per week: 3 days    Minutes per session: 60 min  . Stress: Only a little  Relationships  . Social connections:    Talks on phone: More than three  times a week    Gets together: More than three times a week    Attends religious service: More than 4 times per year    Active member of club or organization: Yes    Attends meetings of clubs or organizations: More than 4 times per year    Relationship status: Widowed  . Intimate partner violence:    Fear of current or ex partner: No    Emotionally abused: No    Physically abused: No    Forced sexual activity: No  Other Topics Concern  . Not on file  Social History Narrative   Widowed.    No regular exercise.     Current Outpatient Medications:  .  allopurinol (ZYLOPRIM) 100 MG tablet, Take 1 tablet (100 mg total) by mouth daily., Disp: 90 tablet, Rfl: 1 .  amLODipine-olmesartan (AZOR) 10-40 MG tablet, Take 1 tablet by mouth daily., Disp: 90 tablet, Rfl: 1 .  ammonium lactate (LAC-HYDRIN) 12 % cream, Apply topically as needed for dry skin., Disp: 385 g, Rfl: 0 .  aspirin 81 MG EC tablet, Take 81 mg by mouth daily.  , Disp: , Rfl:  .  atorvastatin (LIPITOR) 40 MG tablet, Take 1 tablet (40 mg total) by mouth daily., Disp: 90 tablet, Rfl: 1 .  carvedilol (COREG) 12.5 MG tablet, Take 1 tablet (12.5 mg total) by mouth 2 (two) times daily with a meal., Disp: 180 tablet, Rfl: 1 .  Cyanocobalamin 500 MCG SUBL, Place 1 tablet under the tongue daily., Disp: , Rfl:  .  fluticasone (FLONASE) 50 MCG/ACT nasal spray, Place 2 sprays into both nostrils daily., Disp: 16 g, Rfl: 6 .  glucose blood (FREESTYLE LITE) test strip, FREESTYLE LITE TEST (In Vitro Strip)  check fsbs once a day for 0 days  Quantity: 100.00;  Refills: 5   Ordered :31-Dec-2009  Steele Sizer MD;  Dani Gobble Active Comments: DX: 250.00, Disp: , Rfl:  .  LANCETS ULTRA THIN 30G MISC, , Disp: , Rfl:  .  metFORMIN (GLUCOPHAGE-XR) 750 MG 24 hr tablet, Take 1 tablet (750 mg total) by mouth daily with breakfast., Disp: 90 tablet, Rfl: 1 .  metroNIDAZOLE (FLAGYL) 500 MG tablet, Take 2 tablets (1,000 mg total) by mouth 2 (two)  times daily., Disp: 4 tablet, Rfl: 0 .  pregabalin (LYRICA) 100 MG capsule, Take 1 capsule (100 mg total) by mouth 2 (two) times daily., Disp: 180 capsule, Rfl: 0  No Known Allergies  I personally reviewed active problem list, medication list, allergies, family history, social history with the patient/caregiver today.   ROS  Ten systems reviewed and is negative except as mentioned in HPI   Objective  Vitals:  12/07/17 1027  BP: (!) 142/96  Pulse: 88  Resp: 16  Temp: 98.6 F (37 C)  TempSrc: Oral  SpO2: 98%  Weight: 208 lb 9.6 oz (94.6 kg)  Height: 5\' 7"  (1.702 m)    Body mass index is 32.67 kg/m.  Physical Exam  Constitutional: Patient appears well-developed and well-nourished. Obese  No distress.  HEENT: head atraumatic, normocephalic, pupils equal and reactive to light,neck supple, throat within normal limits Cardiovascular: Normal rate, regular rhythm and normal heart sounds.  No murmur heard. No BLE edema. Pulmonary/Chest: Effort normal and breath sounds normal. No respiratory distress. Abdominal: Soft.  There is no tenderness. Psychiatric: very upset, crying  Skin: no rashes   Recent Results (from the past 2160 hour(s))  Urinalysis, Complete w Microscopic     Status: Abnormal   Collection Time: 09/17/17  5:26 AM  Result Value Ref Range   Color, Urine YELLOW (A) YELLOW   APPearance CLOUDY (A) CLEAR   Specific Gravity, Urine 1.012 1.005 - 1.030   pH 6.0 5.0 - 8.0   Glucose, UA NEGATIVE NEGATIVE mg/dL   Hgb urine dipstick LARGE (A) NEGATIVE   Bilirubin Urine NEGATIVE NEGATIVE   Ketones, ur NEGATIVE NEGATIVE mg/dL   Protein, ur 100 (A) NEGATIVE mg/dL   Nitrite POSITIVE (A) NEGATIVE   Leukocytes, UA LARGE (A) NEGATIVE   RBC / HPF >50 (H) 0 - 5 RBC/hpf   WBC, UA >50 (H) 0 - 5 WBC/hpf   Bacteria, UA FEW (A) NONE SEEN   Squamous Epithelial / LPF 0-5 0 - 5   WBC Clumps PRESENT     Comment: Performed at [facility], [address], [place],  [place] [address]  Urine Culture     Status: Abnormal   Collection Time: 09/17/17  5:26 AM  Result Value Ref Range   Specimen Description      URINE, RANDOM Performed at [facility], [address] [place], [place] [address]    Special Requests      Normal Performed at [facility], [address] [place], [place] [address]    Culture >=100,000 COLONIES/mL ESCHERICHIA COLI (A)    Report Status 09/19/2017 FINAL    Organism ID, Bacteria ESCHERICHIA COLI (A)       Susceptibility   Escherichia coli - MIC*    AMPICILLIN >=32 RESISTANT Resistant     CEFAZOLIN <=4 SENSITIVE Sensitive     CEFTRIAXONE <=1 SENSITIVE Sensitive     CIPROFLOXACIN >=4 RESISTANT Resistant     GENTAMICIN <=1 SENSITIVE Sensitive     IMIPENEM 1 SENSITIVE Sensitive     NITROFURANTOIN <=16 SENSITIVE Sensitive     TRIMETH/SULFA >=320 RESISTANT Resistant     AMPICILLIN/SULBACTAM 16 INTERMEDIATE Intermediate     PIP/TAZO <=4 SENSITIVE Sensitive     Extended ESBL NEGATIVE Sensitive     * >=100,000 COLONIES/mL ESCHERICHIA COLI  Wet prep, genital     Status: Abnormal   Collection Time: 09/17/17  7:26 AM  Result Value Ref Range   Yeast Wet Prep HPF POC NONE SEEN NONE SEEN   Trich, Wet Prep PRESENT (A) NONE SEEN   Clue Cells Wet Prep HPF POC PRESENT (A) NONE SEEN   WBC, Wet Prep HPF POC MANY (A) NONE SEEN   Sperm NONE SEEN     Comment: Performed at [facility], [address] [place], [place] [address]  POCT urinalysis dipstick     Status: Abnormal   Collection Time: 10/30/17  8:53 AM  Result Value Ref Range   Color, UA yellow    Clarity, UA clear    Glucose, UA Negative Negative   Bilirubin, UA negative    Ketones, UA negative    Spec Grav, UA 1.015 1.010 - 1.025   Blood, UA moderate    pH, UA 6.5 5.0 - 8.0   Protein, UA Positive (A) Negative   Urobilinogen, UA 0.2 0.2 or 1.0 E.U./dL   Nitrite, UA negative    Leukocytes, UA Large (3+) (A) Negative   Appearance clear    Odor none    Lipid panel     Status: Abnormal   Collection Time: 10/30/17  8:53 AM  Result Value Ref Range   Cholesterol 125 <200 mg/dL   HDL 45 (L) >50 mg/dL   Triglycerides 115 <150 mg/dL   LDL Cholesterol (Calc) 60 mg/dL (calc)    Comment: Reference range: <100 . Desirable range <100 mg/dL for primary prevention;   <70 mg/dL for patients with CHD or diabetic patients  with > or = 2 CHD risk factors. Marland Kitchen LDL-C is now calculated using the Martin-Hopkins  calculation, which is a validated novel method providing  better accuracy than the Friedewald equation in the  estimation of LDL-C.  Cresenciano Genre et al. Annamaria Helling. 9702;637(85): 2061-2068  (http://education.[facility].com/faq/FAQ164)    Total CHOL/HDL Ratio 2.8 <5.0 (calc)   Non-HDL Cholesterol (Calc) 80 <130 mg/dL (calc)    Comment: For patients with diabetes plus 1 major ASCVD risk  factor, treating to a non-HDL-C goal of <100 mg/dL  (LDL-C of <70 mg/dL) is considered a therapeutic  option.   COMPLETE METABOLIC PANEL WITH GFR     Status: Abnormal   Collection Time: 10/30/17  8:53 AM  Result Value Ref Range   Glucose, Bld 122 (H) 65 - 99 mg/dL    Comment: .            Fasting reference interval . For someone without known diabetes, a glucose value between 100 and 125 mg/dL is consistent with prediabetes and should be confirmed with a follow-up test. .    BUN 22 7 - 25 mg/dL   Creat 1.10 (H) 0.50 - 0.99 mg/dL    Comment: For patients >1 years of age, the reference limit for Creatinine is approximately 13% higher for people identified as African-American. .    GFR, Est Non African American 54 (L) > OR = 60 mL/min/1.69m2   GFR, Est African American 62 > OR = 60 mL/min/1.32m2   BUN/Creatinine Ratio 20 6 - 22 (calc)   Sodium 145 135 - 146 mmol/L   Potassium 4.1 3.5 - 5.3 mmol/L   Chloride 106 98 - 110 mmol/L   CO2 28 20 - 32 mmol/L   Calcium 9.6 8.6 - 10.4 mg/dL   Total Protein 7.5 6.1 - 8.1 g/dL   Albumin 4.1 3.6 - 5.1 g/dL    Globulin 3.4 1.9 - 3.7 g/dL (calc)   AG Ratio 1.2 1.0 - 2.5 (calc)   Total Bilirubin 0.3 0.2 - 1.2 mg/dL   Alkaline phosphatase (APISO) 87 33 - 130 U/L   AST 13 10 - 35 U/L   ALT 15 6 - 29 U/L  Urine Culture     Status: None   Collection Time: 10/30/17  9:48 AM  Result Value Ref Range   MICRO NUMBER: [id number]    SPECIMEN QUALITY: ADEQUATE    Sample Source URINE    STATUS: FINAL    Result: No Growth   Urinalysis, Complete  Status: Abnormal   Collection Time: 11/28/17  3:10 PM  Result Value Ref Range   Specific Gravity, UA >1.030 (H) 1.005 - 1.030   pH, UA 5.5 5.0 - 7.5   Color, UA Yellow Yellow   Appearance Ur Cloudy (A) Clear   Leukocytes, UA 1+ (A) Negative   Protein, UA 3+ (A) Negative/Trace   Glucose, UA Negative Negative   Ketones, UA Trace (A) Negative   RBC, UA Trace (A) Negative   Bilirubin, UA Negative Negative   Urobilinogen, Ur 0.2 0.2 - 1.0 mg/dL   Nitrite, UA Negative Negative   Microscopic Examination See below:   Microscopic Examination     Status: Abnormal   Collection Time: 11/28/17  3:10 PM  Result Value Ref Range   WBC, UA 6-10 (A) 0 - 5 /hpf   RBC, UA 0-2 0 - 2 /hpf   Epithelial Cells (non renal) 0-10 0 - 10 /hpf   Casts Present (A) None seen /lpf   Cast Type Hyaline casts N/A   Mucus, UA Present (A) Not Estab.   Bacteria, UA Many (A) None seen/Few   Trichomonas, UA Present (A) None seen  Urine cytology ancillary only     Status: Abnormal   Collection Time: 12/01/17 12:00 AM  Result Value Ref Range   Trichomonas **POSITIVE** (A)     Comment: Normal Reference Range - Negative  HIV Antibody (routine testing w rflx)     Status: None   Collection Time: 12/01/17  9:31 AM  Result Value Ref Range   HIV 1&2 Ab, 4th Generation NON-REACTIVE NON-REACTI    Comment: HIV-1 antigen and HIV-1/HIV-2 antibodies were not detected. There is no laboratory evidence of HIV infection. Marland Kitchen PLEASE NOTE: This information has been disclosed to you from records whose  confidentiality may be protected by state law.  If your state requires such protection, then the state law prohibits you from making any further disclosure of the information without the specific written consent of the person to whom it pertains, or as otherwise permitted by law. A general authorization for the release of medical or other information is NOT sufficient for this purpose. . For additional information please refer to http://education.[facility].com/faq/FAQ106 (This link is being provided for informational/ educational purposes only.) . Marland Kitchen The performance of this assay has not been clinically validated in patients less than 43 years old. .   RPR     Status: Abnormal   Collection Time: 12/01/17  9:31 AM  Result Value Ref Range   RPR Ser Ql REACTIVE (A) NON-REACTI  Hepatitis, Acute     Status: None   Collection Time: 12/01/17  9:31 AM  Result Value Ref Range   Hep A IgM NON-REACTIVE NON-REACTI   Hepatitis B Surface Ag NON-REACTIVE NON-REACTI   Hep B C IgM NON-REACTIVE NON-REACTI   Hepatitis C Ab NON-REACTIVE NON-REACTI   SIGNAL TO CUT-OFF 0.04 <1.00    Comment: . HCV antibody was non-reactive. There is no laboratory  evidence of HCV infection. . In most cases, no further action is required. However, if recent HCV exposure is suspected, a test for HCV RNA (test code 3102431165) is suggested. . For additional information please refer to http://education.[facility].com/faq/FAQ22v1 (This link is being provided for informational/ educational purposes only.) . For additional information, please refer to  http://education.[facility].com/faq/FAQ202  (This link is being provided for informational/ educational purposes only.)   Hemoglobin A1c     Status: Abnormal   Collection Time: 12/01/17  9:31 AM  Result Value Ref  Range   Hgb A1c MFr Bld 6.7 (H) <5.7 % of total Hgb    Comment: For someone without known diabetes, a hemoglobin A1c value of 6.5% or  greater indicates that they may have  diabetes and this should be confirmed with a follow-up  test. . For someone with known diabetes, a value <7% indicates  that their diabetes is well controlled and a value  greater than or equal to 7% indicates suboptimal  control. A1c targets should be individualized based on  duration of diabetes, age, comorbid conditions, and  other considerations. . Currently, no consensus exists regarding use of hemoglobin A1c for diagnosis of diabetes for children. .    Mean Plasma Glucose 146 (calc)   eAG (mmol/L) 8.1 (calc)  CBC with Differential/Platelet     Status: Abnormal   Collection Time: 12/01/17  9:31 AM  Result Value Ref Range   WBC 6.4 3.8 - 10.8 Thousand/uL   RBC 4.53 3.80 - 5.10 Million/uL   Hemoglobin 11.8 11.7 - 15.5 g/dL   HCT 35.9 35.0 - 45.0 %   MCV 79.2 (L) 80.0 - 100.0 fL   MCH 26.0 (L) 27.0 - 33.0 pg   MCHC 32.9 32.0 - 36.0 g/dL   RDW 15.8 (H) 11.0 - 15.0 %   Platelets 332 140 - 400 Thousand/uL   MPV 9.4 7.5 - 12.5 fL   Neutro Abs 4,173 1,500 - 7,800 cells/uL   Lymphs Abs 1,882 850 - 3,900 cells/uL   WBC mixed population 224 200 - 950 cells/uL   Eosinophils Absolute 90 15 - 500 cells/uL   Basophils Absolute 32 0 - 200 cells/uL   Neutrophils Relative % 65.2 %   Total Lymphocyte 29.4 %   Monocytes Relative 3.5 %   Eosinophils Relative 1.4 %   Basophils Relative 0.5 %  Rpr titer     Status: Abnormal   Collection Time: 12/01/17  9:31 AM  Result Value Ref Range   RPR Titer 1:4 (H)   Fluorescent treponemal ab(fta)-IgG-bld     Status: Abnormal   Collection Time: 12/01/17  9:31 AM  Result Value Ref Range   Fluorescent Treponemal ABS REACTIVE (A) NON-REACTI      PHQ2/9: Depression screen [facility] 2/9 12/01/2017 10/30/2017 08/23/2017 05/22/2017 02/20/2017  Decreased Interest 0 0 0 0 0  Down, Depressed, Hopeless 0 0 0 0 0  PHQ - 2 Score 0 0 0 0 0  Altered sleeping 0 0 - - -  Tired, decreased energy 0 0 - - -  Change in appetite 0 0  - - -  Feeling bad or failure about yourself  0 0 - - -  Trouble concentrating 0 0 - - -  Moving slowly or fidgety/restless 0 0 - - -  Suicidal thoughts 0 0 - - -  PHQ-9 Score 0 0 - - -  Difficult doing work/chores Not difficult at all Not difficult at all - - -     Fall Risk: Fall Risk  12/01/2017 10/30/2017 08/23/2017 05/22/2017 02/20/2017  Falls in the past year? No No No No No     Assessment & Plan  1. Syphili, latent  Explained we don't have [place] benzatine in the office, she will go to the health department today , gave her information about syphilis from the [facility]. She cried during visit. Explained we are here for her.

## 2018-01-04 ENCOUNTER — Ambulatory Visit (INDEPENDENT_AMBULATORY_CARE_PROVIDER_SITE_OTHER): Payer: Commercial Managed Care - PPO | Admitting: Family Medicine

## 2018-01-04 ENCOUNTER — Encounter: Payer: Self-pay | Admitting: Family Medicine

## 2018-01-04 VITALS — BP 134/78 | HR 80 | Temp 98.3°F | Resp 16 | Ht 67.0 in | Wt 215.8 lb

## 2018-01-04 DIAGNOSIS — A599 Trichomoniasis, unspecified: Secondary | ICD-10-CM | POA: Diagnosis not present

## 2018-01-04 NOTE — Progress Notes (Signed)
Acute Office Visit  Subjective:    Patient ID: Cheryl Barr, female    DOB: [birth year], 62 y.o.   MRN: [id number]  Chief Complaint  Patient presents with  . Follow-up    1 month F/U    HPI   Patient is in today for trichomonas follow up, she was also positive for syphilis and [facility], last treatment Nov 7th and states labs were done there after the treatment. She denies vaginal discharge or itching since treatment. No rashes. Still living with boyfriend with boyfriend but not currently sexually active and he went last week to the health department for blood test. She is still upset, but doing better now.   Past Medical History:  Diagnosis Date  . Arthritis   . Colon polyp 2009  . Depression   . Diabetes mellitus    Type 2  . Fibromyalgia   . Gout   . Hyperlipidemia   . Hypertension   . Insomnia   . Menopause   . Vitamin D deficiency     Past Surgical History:  Procedure Laterality Date  . BREAST BIOPSY Left 05/24/2013   neg  . BREAST EXCISIONAL BIOPSY Left 06/21/2013   neg/ dr. Bary Castilla  . BREAST SURGERY Left 2015  . COLONOSCOPY  2009   1 polyp found. Dr. Roselyn Reef  . HERNIA REPAIR  37/90/2409   Ventral/umbilical hernia repair with 10 x 10 cm Atrium mesh in preperitoneal position.  . INSERTION OF MESH N/A 10/09/2014   Procedure: INSERTION OF MESH;  Surgeon: Robert Bellow, MD;  Location: [facility] ORS;  Service: General;  Laterality: N/A;  . KNEE SURGERY Left 01/14/2009  . STRABISMUS SURGERY     Repair as a child  . TUBAL LIGATION  1990  . VENTRAL HERNIA REPAIR N/A 10/09/2014   Procedure: HERNIA REPAIR VENTRAL ADULT;  Surgeon: Robert Bellow, MD;  Location: [facility] ORS;  Service: General;  Laterality: N/A;    Family History  Problem Relation Age of Onset  . Hypertension Mother   . Diabetes Mother   . Heart attack Father   . Coronary artery disease Father   . Breast cancer Father 32  . Allergies Son        Barista  . Diabetes  Sister   . Diabetes Brother   . Gout Brother     Social History   Socioeconomic History  . Marital status: Widowed    Spouse name: Not on file  . Number of children: 3  . Years of education: 12th Grade  . Highest education level: Not on file  Occupational History    Employer: [employer]  Social Needs  . Financial resource strain: Not hard at all  . Food insecurity:    Worry: Never true    Inability: Never true  . Transportation needs:    Medical: No    Non-medical: No  Tobacco Use  . Smoking status: Former Smoker    Types: Cigarettes    Last attempt to quit: 10/02/1983    Years since quitting: 34.2  . Smokeless tobacco: Never Used  Substance and Sexual Activity  . Alcohol use: No    Alcohol/week: 0.0 standard drinks  . Drug use: No  . Sexual activity: Yes    Partners: Male    Birth control/protection: None  Lifestyle  . Physical activity:    Days per week: 3 days    Minutes per session: 60 min  . Stress: Rather  much  Relationships  . Social connections:    Talks on phone: More than three times a week    Gets together: More than three times a week    Attends religious service: More than 4 times per year    Active member of club or organization: Yes    Attends meetings of clubs or organizations: More than 4 times per year    Relationship status: Widowed  . Intimate partner violence:    Fear of current or ex partner: No    Emotionally abused: No    Physically abused: No    Forced sexual activity: No  Other Topics Concern  . Not on file  Social History Narrative   Widowed.    No regular exercise.    Outpatient Medications Prior to Visit  Medication Sig Dispense Refill  . allopurinol (ZYLOPRIM) 100 MG tablet Take 1 tablet (100 mg total) by mouth daily. 90 tablet 1  . amLODipine-olmesartan (AZOR) 10-40 MG tablet Take 1 tablet by mouth daily. 90 tablet 1  . ammonium lactate (LAC-HYDRIN) 12 % cream Apply topically as needed for dry skin. 385 g 0  .  aspirin 81 MG EC tablet Take 81 mg by mouth daily.      Marland Kitchen atorvastatin (LIPITOR) 40 MG tablet Take 1 tablet (40 mg total) by mouth daily. 90 tablet 1  . carvedilol (COREG) 12.5 MG tablet Take 1 tablet (12.5 mg total) by mouth 2 (two) times daily with a meal. 180 tablet 1  . Cyanocobalamin 500 MCG SUBL Place 1 tablet under the tongue daily.    . fluticasone (FLONASE) 50 MCG/ACT nasal spray Place 2 sprays into both nostrils daily. 16 g 6  . glucose blood (FREESTYLE LITE) test strip FREESTYLE LITE TEST (In Vitro Strip)  check fsbs once a day for 0 days  Quantity: 100.00;  Refills: 5   Ordered :31-Dec-2009  Steele Sizer MD;  Dani Gobble Active Comments: DX: 250.00    . LANCETS ULTRA THIN 30G MISC     . metFORMIN (GLUCOPHAGE-XR) 750 MG 24 hr tablet Take 1 tablet (750 mg total) by mouth daily with breakfast. 90 tablet 1  . metroNIDAZOLE (FLAGYL) 500 MG tablet Take 2 tablets (1,000 mg total) by mouth 2 (two) times daily. 4 tablet 0  . pregabalin (LYRICA) 100 MG capsule Take 1 capsule (100 mg total) by mouth 2 (two) times daily. 180 capsule 0   No facility-administered medications prior to visit.     No Known Allergies  ROS  Ten systems reviewed and is negative except as mentioned in HPI     Objective:    Physical Exam  BP 134/78 (BP Location: Left Arm, Patient Position: Sitting, Cuff Size: Large)   Pulse 80   Temp 98.3 F (36.8 C) (Oral)   Resp 16   Ht 5\' 7"  (1.702 m)   Wt 215 lb 12.8 oz (97.9 kg)   SpO2 99%   BMI 33.80 kg/m  Wt Readings from Last 3 Encounters:  01/04/18 215 lb 12.8 oz (97.9 kg)  12/07/17 208 lb 9.6 oz (94.6 kg)  12/01/17 219 lb 11.2 oz (99.7 kg)    Health Maintenance Due  Topic Date Due  . MAMMOGRAM  06/16/2017  . OPHTHALMOLOGY EXAM  10/12/2017    There are no preventive care reminders to display for this patient.   Lab Results  Component Value Date   TSH 1.65 04/13/2016   Lab Results  Component Value Date   WBC 6.4 12/01/2017  HGB  11.8 12/01/2017   HCT 35.9 12/01/2017   MCV 79.2 (L) 12/01/2017   PLT 332 12/01/2017   Lab Results  Component Value Date   NA 145 10/30/2017   K 4.1 10/30/2017   CO2 28 10/30/2017   GLUCOSE 122 (H) 10/30/2017   BUN 22 10/30/2017   CREATININE 1.10 (H) 10/30/2017   BILITOT 0.3 10/30/2017   ALKPHOS 68 04/13/2016   AST 13 10/30/2017   ALT 15 10/30/2017   PROT 7.5 10/30/2017   ALBUMIN 4.1 04/13/2016   CALCIUM 9.6 10/30/2017   ANIONGAP 9 06/05/2015   Lab Results  Component Value Date   CHOL 125 10/30/2017   Lab Results  Component Value Date   HDL 45 (L) 10/30/2017   Lab Results  Component Value Date   LDLCALC 60 10/30/2017   Lab Results  Component Value Date   TRIG 115 10/30/2017   Lab Results  Component Value Date   CHOLHDL 2.8 10/30/2017   Lab Results  Component Value Date   HGBA1C 6.7 (H) 12/01/2017       Assessment & Plan:   1. Trichomoniasis  - Wet prep, genital  Finished latent syphilis treatment    Loistine Chance, MD

## 2018-01-05 ENCOUNTER — Telehealth: Payer: Self-pay | Admitting: Family Medicine

## 2018-01-05 ENCOUNTER — Other Ambulatory Visit (HOSPITAL_COMMUNITY)
Admission: RE | Admit: 2018-01-05 | Discharge: 2018-01-05 | Disposition: A | Discharge status: Home / Self Care | Payer: Commercial Managed Care - PPO | Source: Ambulatory Visit | Attending: Family Medicine | Admitting: Family Medicine

## 2018-01-05 DIAGNOSIS — A599 Trichomoniasis, unspecified: Secondary | ICD-10-CM | POA: Diagnosis present

## 2018-01-05 LAB — CERVICOVAGINAL ANCILLARY ONLY
BACTERIAL VAGINITIS: NEGATIVE
CANDIDA VAGINITIS: POSITIVE — AB
Chlamydia: NEGATIVE
NEISSERIA GONORRHEA: NEGATIVE
TRICH (WINDOWPATH): NEGATIVE

## 2018-01-05 NOTE — Telephone Encounter (Signed)
Spoke with Stanton Kidney at [facility] and she wanted to know what Dr. Ancil Boozer was looking for with the specimen because the wrong test was ordered. Spoke with Dr. Ancil Boozer and she wanted everything we could order on it but most importantly  Trichomoniasis, but we were able to add GC/Ch, Cvag, Bvag and Trich. Orders were changed in Epic and verbal ordered ok per Dr. Ancil Boozer.

## 2018-01-05 NOTE — Telephone Encounter (Unsigned)
Copied from [place] [id number]. Topic: Quick Communication - See Telephone Encounter >> Jan 05, 2018  9:11 AM Percell Belt A wrote: CRM for notification. See Telephone encounter for: 01/05/18.  Stanton Kidney with [facility]  [phone] They need clarification on the wet prep that was sent over as soon as possible

## 2018-01-05 NOTE — Addendum Note (Signed)
Addended by: Inda Coke on: 01/05/2018 03:21 PM   Modules accepted: Orders

## 2018-02-26 ENCOUNTER — Telehealth: Payer: Self-pay | Admitting: Family Medicine

## 2018-02-26 ENCOUNTER — Other Ambulatory Visit: Payer: Self-pay | Admitting: Family Medicine

## 2018-02-26 DIAGNOSIS — Z1239 Encounter for other screening for malignant neoplasm of breast: Secondary | ICD-10-CM

## 2018-02-26 NOTE — Telephone Encounter (Signed)
Copied from [place] [id number]. Topic: Quick Communication - See Telephone Encounter >> Feb 26, 2018  2:29 PM Antonieta Iba C wrote: CRM for notification. See Telephone encounter for: 02/26/18.  Pt is requesting to have mammogram orders placed. Pt says that she would like to have imaging completed at the hospital. Pt says that location is called [place].

## 2018-02-26 NOTE — Telephone Encounter (Unsigned)
Copied from [place] [phone]. Topic: Quick Communication - See Telephone Encounter >> Feb 26, 2018  2:29 PM Antonieta Iba C wrote: CRM for notification. See Telephone encounter for: 02/26/18.  Pt is requesting to have mammogram orders placed. Pt says that she would like to have imaging completed at the hospital. Pt says that location is called [place].

## 2018-03-01 ENCOUNTER — Encounter: Payer: Self-pay | Admitting: Urology

## 2018-03-01 ENCOUNTER — Ambulatory Visit (INDEPENDENT_AMBULATORY_CARE_PROVIDER_SITE_OTHER): Payer: Commercial Managed Care - PPO | Admitting: Urology

## 2018-03-01 VITALS — BP 144/86 | HR 75 | Ht 67.0 in | Wt 219.0 lb

## 2018-03-01 DIAGNOSIS — R3129 Other microscopic hematuria: Secondary | ICD-10-CM

## 2018-03-01 LAB — URINALYSIS, COMPLETE
Bilirubin, UA: NEGATIVE
GLUCOSE, UA: NEGATIVE
Nitrite, UA: NEGATIVE
SPEC GRAV UA: 1.025 (ref 1.005–1.030)
UUROB: 0.2 mg/dL (ref 0.2–1.0)
pH, UA: 5.5 (ref 5.0–7.5)

## 2018-03-01 LAB — MICROSCOPIC EXAMINATION: RBC MICROSCOPIC, UA: NONE SEEN /HPF (ref 0–2)

## 2018-03-01 NOTE — Progress Notes (Signed)
error 

## 2018-03-01 NOTE — Progress Notes (Signed)
   03/01/2018 4:17 PM   Cheryl Barr 11/20/1955 [id number]  Reason for visit: Follow up microscopic hematuria  HPI: I saw Cheryl Barr back in urology clinic today.  I originally saw her in October 2019 in consultation for possible symptomatic microscopic hematuria.  On further chart review however, she had "moderate RBCs" on a urinalysis at the time of a culture positive symptomatic urinary tract infection.  She denies any history of gross hematuria.  She has a minimal smoking history.  She is here today for repeat urinalysis to confirm absence of asymptomatic microscopic hematuria.  Urinalysis today with 0-5 WBCs, 0 RBCs, many bacteria, nitrite negative  Assessment & Plan:   In summary, the patient is a 63 year old female with an episode of "moderate RBCs" on a urinalysis at time of a culture positive symptomatic urinary tract infection, with follow-up urinalysis 11/28/2017 and today with absence of asymptomatic microscopic hematuria.  We discussed that she has no risk factors, and no evidence of asymptomatic microscopic hematuria that would warrant further work-up with CT urogram and cystoscopy.  RTC as needed, screening urinalysis with primary care  A total of 10 minutes were spent face-to-face with the patient, greater than 50% was spent in patient education, counseling, and coordination of care regarding asymptomatic microscopic hematuria.  Billey Co, [facility] [address] [place], [place] [address] [phone]

## 2018-03-26 ENCOUNTER — Other Ambulatory Visit: Payer: Self-pay | Admitting: Family Medicine

## 2018-03-26 DIAGNOSIS — Z1231 Encounter for screening mammogram for malignant neoplasm of breast: Secondary | ICD-10-CM

## 2018-04-03 ENCOUNTER — Ambulatory Visit (INDEPENDENT_AMBULATORY_CARE_PROVIDER_SITE_OTHER): Payer: Commercial Managed Care - PPO | Admitting: Family Medicine

## 2018-04-03 ENCOUNTER — Encounter: Payer: Self-pay | Admitting: Family Medicine

## 2018-04-03 VITALS — BP 132/86 | HR 82 | Temp 98.7°F | Resp 16 | Ht 67.0 in | Wt 214.2 lb

## 2018-04-03 DIAGNOSIS — E785 Hyperlipidemia, unspecified: Secondary | ICD-10-CM

## 2018-04-03 DIAGNOSIS — I10 Essential (primary) hypertension: Secondary | ICD-10-CM

## 2018-04-03 DIAGNOSIS — Z113 Encounter for screening for infections with a predominantly sexual mode of transmission: Secondary | ICD-10-CM

## 2018-04-03 DIAGNOSIS — J4521 Mild intermittent asthma with (acute) exacerbation: Secondary | ICD-10-CM

## 2018-04-03 DIAGNOSIS — E1129 Type 2 diabetes mellitus with other diabetic kidney complication: Secondary | ICD-10-CM | POA: Diagnosis not present

## 2018-04-03 DIAGNOSIS — G5711 Meralgia paresthetica, right lower limb: Secondary | ICD-10-CM

## 2018-04-03 DIAGNOSIS — R809 Proteinuria, unspecified: Secondary | ICD-10-CM

## 2018-04-03 DIAGNOSIS — M109 Gout, unspecified: Secondary | ICD-10-CM

## 2018-04-03 LAB — POCT GLYCOSYLATED HEMOGLOBIN (HGB A1C): HEMOGLOBIN A1C: 6.6 % — AB (ref 4.0–5.6)

## 2018-04-03 MED ORDER — METFORMIN HCL ER 750 MG PO TB24: 750.0000 mg | ORAL_TABLET | Freq: Every day | ORAL | 1 refills | Status: DC

## 2018-04-03 MED ORDER — AMLODIPINE-OLMESARTAN 10-40 MG PO TABS: 1.0000 | ORAL_TABLET | Freq: Every day | ORAL | 1 refills | Status: DC

## 2018-04-03 MED ORDER — BENZONATATE 100 MG PO CAPS: 100.0000 mg | ORAL_CAPSULE | Freq: Two times a day (BID) | ORAL | 0 refills | Status: DC | PRN

## 2018-04-03 MED ORDER — ATORVASTATIN CALCIUM 40 MG PO TABS: 40.0000 mg | ORAL_TABLET | Freq: Every day | ORAL | 1 refills | Status: DC

## 2018-04-03 MED ORDER — ALLOPURINOL 100 MG PO TABS: 100.0000 mg | ORAL_TABLET | Freq: Every day | ORAL | 1 refills | Status: DC

## 2018-04-03 MED ORDER — PREGABALIN 100 MG PO CAPS: 100.0000 mg | ORAL_CAPSULE | Freq: Two times a day (BID) | ORAL | 1 refills | Status: DC

## 2018-04-03 MED ORDER — BUDESONIDE-FORMOTEROL FUMARATE 160-4.5 MCG/ACT IN AERO: 2.0000 | INHALATION_SPRAY | Freq: Two times a day (BID) | RESPIRATORY_TRACT | 2 refills | Status: DC

## 2018-04-03 MED ORDER — CARVEDILOL 12.5 MG PO TABS: 12.5000 mg | ORAL_TABLET | Freq: Two times a day (BID) | ORAL | 1 refills | Status: DC

## 2018-04-03 NOTE — Progress Notes (Signed)
Name: Cheryl Barr   MRN: [id number]    DOB: 1956-01-24   Date:04/03/2018       Progress Note  Subjective  Chief Complaint  Chief Complaint  Patient presents with  . Diabetes  . Hypertension  . Dyslipidemia  . Cough    productive cough with clear mucous x 1 week. She has treated cough with Alka-seltzer, Robitussin, Mucinex with poor relief.    HPI  History of latent Syphilis and trichomonas: she was treated for both, [facility], boyfriend also went to be checked , but she is not sure what he was treated for. She used condoms a few times, but not anymore, no vaginal discharge, last wet prep negative for trichomonas.   HTN: compliant with medication, Azor,and carvedilol, bp is at goal today. Shedenies side effects, no chest pain, palpitation, mild SOB but just recently because of coughing spells. Off diureticsbecause of gout. She is following a DASH. She has a history of microalbuminuria and is on ARB  Hyperlipidemia: last LDL was at goal down to 60 back in 10/2017  continue high dose statin therapy No myalgia, HDL at goal, eating fruit , vegetables and fish on a regular basis   DMII with microalbuminuria: urine micro was 100 in Dec 2016 and Nov 2017 and 01/2019Taking ARB, and aspirin, also Metfomin, she has symptoms of meralgia paresthetica on right side for over one year, she is on Lyrica, she is not sure if it works, advised to try to wean off and see if symptoms get worse, we can also go up on the dose if she notices that is helping after allA1C is at goal. She is due for an eye exam. No polyphagia, polydipsia or polyuria.   Gout: . She is back on Allopurinol now . Doing well at this time. No recent episodes. Last uric acid 6.2.Off HCTZ. Unchanged   Obesity: lost 5 lbs since last visit, eating healthy and doing well  Cough: seen by Dr. 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 She  states symptomsare back since last week, she states had URI symptoms with rhinorrhea, nasal congestion and after that a dry cough, she would like some more perles explained symbicort is free with a voucher and she is wiling to take it also. She states Qvar too expensive   Patient Active Problem List   Diagnosis Date Noted  . Incomplete rotator cuff tear or rupture of left shoulder, not specified as traumatic 07/26/2016  . Thoracic aortic atherosclerosis ([place]) 01/12/2016  . 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 B12 deficiency 01/11/2016  . Hernia of abdominal cavity 09/15/2014  . Impaired renal function 08/10/2014  . Controlled type 2 diabetes with renal manifestation ([place]) 08/10/2014  . Dyslipidemia 08/10/2014  . Interval gout 08/10/2014  . Microalbuminuria 08/10/2014  . Morbid obesity ([place]) 08/10/2014  . Osteoarthritis of left knee 08/10/2014  . Vitamin D deficiency 08/10/2014  . Cough 01/21/2014  . Intraductal papilloma of breast 06/27/2013  . Hypertension, essential, benign 09/02/2009  . ABNORMAL EKG 09/02/2009    Past Surgical History:  Procedure Laterality Date  . BREAST BIOPSY Left 05/24/2013   neg  . BREAST EXCISIONAL BIOPSY Left 06/21/2013   neg/ dr. Bary Castilla  . BREAST SURGERY Left 2015  . COLONOSCOPY  2009   1 polyp found. Dr. Roselyn Reef  . HERNIA REPAIR  38/11/1749   Ventral/umbilical hernia repair with 10 x 10 cm Atrium  mesh in preperitoneal position.  . INSERTION OF MESH N/A 10/09/2014   Procedure: INSERTION OF MESH;  Surgeon: Robert Bellow, MD;  Location: [facility] ORS;  Service: General;  Laterality: N/A;  . KNEE SURGERY Left 01/14/2009  . STRABISMUS SURGERY     Repair as a child  . TUBAL LIGATION  1990  . VENTRAL HERNIA REPAIR N/A 10/09/2014   Procedure: HERNIA REPAIR VENTRAL ADULT;  Surgeon: Robert Bellow, MD;  Location: [facility] ORS;  Service: General;  Laterality: N/A;    Family History  Problem Relation Age of Onset  . Hypertension Mother   .  Diabetes Mother   . Heart attack Father   . Coronary artery disease Father   . Breast cancer Father 38  . Allergies Son        Barista  . Diabetes Sister   . Diabetes Brother   . Gout Brother     Social History   Socioeconomic History  . Marital status: Widowed    Spouse name: Not on file  . Number of children: 3  . Years of education: 12th Grade  . Highest education level: Not on file  Occupational History    Employer: [employer]  Social Needs  . Financial resource strain: Not hard at all  . Food insecurity:    Worry: Never true    Inability: Never true  . Transportation needs:    Medical: No    Non-medical: No  Tobacco Use  . Smoking status: Former Smoker    Types: Cigarettes    Last attempt to quit: 10/02/1983    Years since quitting: 34.5  . Smokeless tobacco: Never Used  Substance and Sexual Activity  . Alcohol use: No    Alcohol/week: 0.0 standard drinks  . Drug use: No  . Sexual activity: Yes    Partners: Male    Birth control/protection: None  Lifestyle  . Physical activity:    Days per week: 3 days    Minutes per session: 60 min  . Stress: Rather much  Relationships  . Social connections:    Talks on phone: More than three times a week    Gets together: More than three times a week    Attends religious service: More than 4 times per year    Active member of club or organization: Yes    Attends meetings of clubs or organizations: More than 4 times per year    Relationship status: Widowed  . Intimate partner violence:    Fear of current or ex partner: No    Emotionally abused: No    Physically abused: No    Forced sexual activity: No  Other Topics Concern  . Not on file  Social History Narrative   Widowed.    No regular exercise.     Current Outpatient Medications:  .  allopurinol (ZYLOPRIM) 100 MG tablet, Take 1 tablet (100 mg total) by mouth daily., Disp: 90 tablet, Rfl: 1 .  amLODipine-olmesartan (AZOR) 10-40 MG  tablet, Take 1 tablet by mouth daily., Disp: 90 tablet, Rfl: 1 .  ammonium lactate (LAC-HYDRIN) 12 % cream, Apply topically as needed for dry skin., Disp: 385 g, Rfl: 0 .  aspirin 81 MG EC tablet, Take 81 mg by mouth daily.  , Disp: , Rfl:  .  atorvastatin (LIPITOR) 40 MG tablet, Take 1 tablet (40 mg total) by mouth daily., Disp: 90 tablet, Rfl: 1 .  carvedilol (COREG) 12.5 MG tablet, Take 1 tablet (12.5  mg total) by mouth 2 (two) times daily with a meal., Disp: 180 tablet, Rfl: 1 .  Cyanocobalamin 500 MCG SUBL, Place 1 tablet under the tongue daily., Disp: , Rfl:  .  glucose blood (FREESTYLE LITE) test strip, FREESTYLE LITE TEST (In Vitro Strip)  check fsbs once a day for 0 days  Quantity: 100.00;  Refills: 5   Ordered :31-Dec-2009  Steele Sizer MD;  Dani Gobble Active Comments: DX: 250.00, Disp: , Rfl:  .  LANCETS ULTRA THIN 30G MISC, , Disp: , Rfl:  .  metFORMIN (GLUCOPHAGE-XR) 750 MG 24 hr tablet, Take 1 tablet (750 mg total) by mouth daily with breakfast., Disp: 90 tablet, Rfl: 1 .  pregabalin (LYRICA) 100 MG capsule, Take 1 capsule (100 mg total) by mouth 2 (two) times daily., Disp: 180 capsule, Rfl: 1 .  fluticasone (FLONASE) 50 MCG/ACT nasal spray, Place 2 sprays into both nostrils daily. (Patient not taking: Reported on 04/03/2018), Disp: 16 g, Rfl: 6 .  metroNIDAZOLE (FLAGYL) 500 MG tablet, Take 2 tablets (1,000 mg total) by mouth 2 (two) times daily. (Patient not taking: Reported on 04/03/2018), Disp: 4 tablet, Rfl: 0  No Known Allergies  I personally reviewed active problem list, medication list, allergies, family history, social history with the patient/caregiver today.   ROS  Constitutional: Negative for fever , positive for mild weight change.  Respiratory: Positive  for cough and has shortness of breath with coughing spells .   Cardiovascular: Negative for chest pain or palpitations.  Gastrointestinal: Negative for abdominal pain, no bowel changes.  Musculoskeletal:  Negative for gait problem or joint swelling.  Skin: Negative for rash.  Neurological: Negative for dizziness or headache.  No other specific complaints in a complete review of systems (except as listed in HPI above).  Objective  Vitals:   04/03/18 1335  BP: 132/86  Pulse: 82  Resp: 16  Temp: 98.7 F (37.1 C)  TempSrc: Oral  SpO2: 98%  Weight: 214 lb 3.2 oz (97.2 kg)  Height: 5\' 7"  (1.702 m)    Body mass index is 33.55 kg/m.  Physical Exam  Constitutional: Patient appears well-developed and well-nourished. Obese  No distress.  HEENT: head atraumatic, normocephalic, pupils equal and reactive to light,  neck supple, throat within normal limits Cardiovascular: Normal rate, regular rhythm and normal heart sounds.  No murmur heard. No BLE edema. Pulmonary/Chest: Effort normal and breath sounds normal. No respiratory distress. Abdominal: Soft.  There is no tenderness. Psychiatric: Patient has a normal mood and affect. behavior is normal. Judgment and thought content normal.  Recent Results (from the past 2160 hour(s))  Cervicovaginal ancillary only     Status: Abnormal   Collection Time: 01/05/18 12:00 AM  Result Value Ref Range   Bacterial vaginitis Negative for Bacterial Vaginitis Microorganisms     Comment: Normal Reference Range - Negative   Candida vaginitis **POSITIVE for Candida glabrata** (A)     Comment: Normal Reference Range - Negative   Chlamydia Negative     Comment: Normal Reference Range - Negative   Neisseria gonorrhea Negative     Comment: Normal Reference Range - Negative   Trichomonas Negative     Comment: Normal Reference Range - Negative  Urinalysis, Complete     Status: Abnormal   Collection Time: 03/01/18  3:39 PM  Result Value Ref Range   Specific Gravity, UA 1.025 1.005 - 1.030   pH, UA 5.5 5.0 - 7.5   Color, UA Yellow Yellow   Appearance Ur Cloudy (  A) Clear   Leukocytes, UA Trace (A) Negative   Protein, UA 3+ (A) Negative/Trace   Glucose, UA  Negative Negative   Ketones, UA Trace (A) Negative   RBC, UA Trace (A) Negative   Bilirubin, UA Negative Negative   Urobilinogen, Ur 0.2 0.2 - 1.0 mg/dL   Nitrite, UA Negative Negative   Microscopic Examination See below:   Microscopic Examination     Status: Abnormal   Collection Time: 03/01/18  3:39 PM  Result Value Ref Range   WBC, UA 0-5 0 - 5 /hpf   RBC, UA None seen 0 - 2 /hpf   Epithelial Cells (non renal) 0-10 0 - 10 /hpf   Casts Present (A) None seen /lpf   Cast Type Hyaline casts N/A   Mucus, UA Present (A) Not Estab.   Bacteria, UA Many (A) None seen/Few  POCT HgB A1C     Status: Abnormal   Collection Time: 04/03/18  1:43 PM  Result Value Ref Range   Hemoglobin A1C 6.6 (A) 4.0 - 5.6 %   HbA1c POC (<> result, manual entry)     HbA1c, POC (prediabetic range)     HbA1c, POC (controlled diabetic range)       PHQ2/9: Depression screen [facility] 2/9 04/03/2018 01/04/2018 12/01/2017 10/30/2017 08/23/2017  Decreased Interest 0 0 0 0 0  Down, Depressed, Hopeless 0 2 0 0 0  PHQ - 2 Score 0 2 0 0 0  Altered sleeping - 2 0 0 -  Tired, decreased energy - 0 0 0 -  Change in appetite - 0 0 0 -  Feeling bad or failure about yourself  - 0 0 0 -  Trouble concentrating - 0 0 0 -  Moving slowly or fidgety/restless - 0 0 0 -  Suicidal thoughts - 0 0 0 -  PHQ-9 Score - 4 0 0 -  Difficult doing work/chores - Not difficult at all Not difficult at all Not difficult at all -    Fall Risk: Fall Risk  04/03/2018 01/04/2018 12/01/2017 10/30/2017 08/23/2017  Falls in the past year? 0 0 No No No  Number falls in past yr: 0 - - - -  Injury with Fall? 0 - - - -      Assessment & Plan  1. Controlled type 2 diabetes mellitus with microalbuminuria, without long-term current use of insulin (HCC)  - POCT HgB A1C - Urine Microalbumin w/creat. ratio - metFORMIN (GLUCOPHAGE-XR) 750 MG 24 hr tablet; Take 1 tablet (750 mg total) by mouth daily with breakfast.  Dispense: 90 tablet; Refill: 1  2.  Meralgia paresthetica of right side  - pregabalin (LYRICA) 100 MG capsule; Take 1 capsule (100 mg total) by mouth 2 (two) times daily.  Dispense: 180 capsule; Refill: 1  3. Hypertension, essential, benign  - carvedilol (COREG) 12.5 MG tablet; Take 1 tablet (12.5 mg total) by mouth 2 (two) times daily with a meal.  Dispense: 180 tablet; Refill: 1 - amLODipine-olmesartan (AZOR) 10-40 MG tablet; Take 1 tablet by mouth daily.  Dispense: 90 tablet; Refill: 1  4. Dyslipidemia  - atorvastatin (LIPITOR) 40 MG tablet; Take 1 tablet (40 mg total) by mouth daily.  Dispense: 90 tablet; Refill: 1  5. Controlled gout  - allopurinol (ZYLOPRIM) 100 MG tablet; Take 1 tablet (100 mg total) by mouth daily.  Dispense: 90 tablet; Refill: 1  6. Routine screening for STI (sexually transmitted infection)  - RPR - HIV Antibody (routine testing w rflx)   7.  Mild intermittent reactive airway disease with acute exacerbation  - budesonide-formoterol (SYMBICORT) 160-4.5 MCG/ACT inhaler; Inhale 2 puffs into the lungs 2 (two) times daily.  Dispense: 1 Inhaler; Refill: 2 - benzonatate (TESSALON) 100 MG capsule; Take 1-2 capsules (100-200 mg total) by mouth 2 (two) times daily as needed.  Dispense: 40 capsule; Refill: 0

## 2018-04-04 LAB — MICROALBUMIN / CREATININE URINE RATIO
Creatinine, Urine: 261 mg/dL (ref 20–275)
Microalb, Ur: >600 mg/dL

## 2018-04-04 LAB — RPR: RPR Ser Ql: NONREACTIVE

## 2018-04-04 LAB — HIV ANTIBODY (ROUTINE TESTING W REFLEX): HIV: NONREACTIVE

## 2018-04-12 ENCOUNTER — Other Ambulatory Visit: Payer: Self-pay | Admitting: Family Medicine

## 2018-04-12 ENCOUNTER — Ambulatory Visit
Admission: RE | Admit: 2018-04-12 | Discharge: 2018-04-12 | Disposition: A | Discharge status: Home / Self Care | Payer: Commercial Managed Care - PPO | Source: Ambulatory Visit | Attending: Family Medicine | Admitting: Family Medicine

## 2018-04-12 DIAGNOSIS — Z1231 Encounter for screening mammogram for malignant neoplasm of breast: Secondary | ICD-10-CM | POA: Diagnosis present

## 2018-04-12 DIAGNOSIS — N6310 Unspecified lump in the right breast, unspecified quadrant: Secondary | ICD-10-CM

## 2018-04-12 DIAGNOSIS — R928 Other abnormal and inconclusive findings on diagnostic imaging of breast: Secondary | ICD-10-CM

## 2018-04-20 ENCOUNTER — Ambulatory Visit
Admission: RE | Admit: 2018-04-20 | Discharge: 2018-04-20 | Disposition: A | Discharge status: Home / Self Care | Payer: Commercial Managed Care - PPO | Source: Ambulatory Visit | Attending: Family Medicine | Admitting: Family Medicine

## 2018-04-20 DIAGNOSIS — N6310 Unspecified lump in the right breast, unspecified quadrant: Secondary | ICD-10-CM

## 2018-04-20 DIAGNOSIS — R928 Other abnormal and inconclusive findings on diagnostic imaging of breast: Secondary | ICD-10-CM

## 2018-05-28 ENCOUNTER — Other Ambulatory Visit: Payer: Self-pay

## 2018-05-28 ENCOUNTER — Emergency Department
Admission: EM | Admit: 2018-05-28 | Discharge: 2018-05-29 | Disposition: A | Discharge status: Home / Self Care | Payer: Commercial Managed Care - PPO | Attending: Emergency Medicine | Admitting: Emergency Medicine

## 2018-05-28 ENCOUNTER — Encounter: Payer: Self-pay | Admitting: Emergency Medicine

## 2018-05-28 DIAGNOSIS — R109 Unspecified abdominal pain: Secondary | ICD-10-CM | POA: Diagnosis present

## 2018-05-28 DIAGNOSIS — Z7984 Long term (current) use of oral hypoglycemic drugs: Secondary | ICD-10-CM | POA: Insufficient documentation

## 2018-05-28 DIAGNOSIS — N390 Urinary tract infection, site not specified: Secondary | ICD-10-CM | POA: Insufficient documentation

## 2018-05-28 DIAGNOSIS — I10 Essential (primary) hypertension: Secondary | ICD-10-CM | POA: Diagnosis not present

## 2018-05-28 DIAGNOSIS — Z87891 Personal history of nicotine dependence: Secondary | ICD-10-CM | POA: Diagnosis not present

## 2018-05-28 DIAGNOSIS — Z79899 Other long term (current) drug therapy: Secondary | ICD-10-CM | POA: Insufficient documentation

## 2018-05-28 DIAGNOSIS — E119 Type 2 diabetes mellitus without complications: Secondary | ICD-10-CM | POA: Diagnosis not present

## 2018-05-28 MED ORDER — SODIUM CHLORIDE 0.9 % IV BOLUS
500.0000 mL | Freq: Once | INTRAVENOUS | Status: AC
  Administered 2018-05-29: 500 mL via INTRAVENOUS

## 2018-05-28 NOTE — ED Triage Notes (Signed)
Pt c/o sudden lower abdominal pain under the umbilical area that started approx. 1 hour ago. Pt denies emesis but reports nausea.

## 2018-05-29 ENCOUNTER — Emergency Department: Payer: Commercial Managed Care - PPO

## 2018-05-29 ENCOUNTER — Encounter: Payer: Self-pay | Admitting: Radiology

## 2018-05-29 LAB — PROTIME-INR
INR: 1 (ref 0.8–1.2)
Prothrombin Time: 13.1 seconds (ref 11.4–15.2)

## 2018-05-29 LAB — URINALYSIS, COMPLETE (UACMP) WITH MICROSCOPIC
Bilirubin Urine: NEGATIVE
Glucose, UA: NEGATIVE mg/dL
Ketones, ur: NEGATIVE mg/dL
Leukocytes,Ua: NEGATIVE
Nitrite: NEGATIVE
Protein, ur: 100 mg/dL — AB
Specific Gravity, Urine: 1.013 (ref 1.005–1.030)
pH: 6 (ref 5.0–8.0)

## 2018-05-29 LAB — CBC WITH DIFFERENTIAL/PLATELET
Abs Immature Granulocytes: 0.07 10*3/uL (ref 0.00–0.07)
Basophils Absolute: 0 10*3/uL (ref 0.0–0.1)
Basophils Relative: 0 %
Eosinophils Absolute: 0 10*3/uL (ref 0.0–0.5)
Eosinophils Relative: 0 %
HCT: 34.5 % — ABNORMAL LOW (ref 36.0–46.0)
Hemoglobin: 11 g/dL — ABNORMAL LOW (ref 12.0–15.0)
Immature Granulocytes: 1 %
Lymphocytes Relative: 11 %
Lymphs Abs: 1.5 10*3/uL (ref 0.7–4.0)
MCH: 26.2 pg (ref 26.0–34.0)
MCHC: 31.9 g/dL (ref 30.0–36.0)
MCV: 82.1 fL (ref 80.0–100.0)
Monocytes Absolute: 0.5 10*3/uL (ref 0.1–1.0)
Monocytes Relative: 4 %
Neutro Abs: 11.5 10*3/uL — ABNORMAL HIGH (ref 1.7–7.7)
Neutrophils Relative %: 84 %
Platelets: 309 10*3/uL (ref 150–400)
RBC: 4.2 MIL/uL (ref 3.87–5.11)
RDW: 15.9 % — ABNORMAL HIGH (ref 11.5–15.5)
WBC: 13.6 10*3/uL — ABNORMAL HIGH (ref 4.0–10.5)
nRBC: 0 % (ref 0.0–0.2)

## 2018-05-29 LAB — COMPREHENSIVE METABOLIC PANEL
ALT: 18 U/L (ref 0–44)
AST: 17 U/L (ref 15–41)
Albumin: 3.8 g/dL (ref 3.5–5.0)
Alkaline Phosphatase: 94 U/L (ref 38–126)
Anion gap: 12 (ref 5–15)
BUN: 27 mg/dL — ABNORMAL HIGH (ref 8–23)
CO2: 26 mmol/L (ref 22–32)
Calcium: 9.2 mg/dL (ref 8.9–10.3)
Chloride: 104 mmol/L (ref 98–111)
Creatinine, Ser: 1.52 mg/dL — ABNORMAL HIGH (ref 0.44–1.00)
GFR calc Af Amer: 42 mL/min — ABNORMAL LOW (ref >60)
GFR calc non Af Amer: 36 mL/min — ABNORMAL LOW (ref >60)
Glucose, Bld: 163 mg/dL — ABNORMAL HIGH (ref 70–99)
Potassium: 3.4 mmol/L — ABNORMAL LOW (ref 3.5–5.1)
Sodium: 142 mmol/L (ref 135–145)
Total Bilirubin: 0.6 mg/dL (ref 0.3–1.2)
Total Protein: 8.3 g/dL — ABNORMAL HIGH (ref 6.5–8.1)

## 2018-05-29 LAB — LIPASE, BLOOD: Lipase: 17 U/L (ref 11–51)

## 2018-05-29 LAB — LACTIC ACID, PLASMA: Lactic Acid, Venous: 2.1 mmol/L (ref 0.5–1.9)

## 2018-05-29 MED ORDER — IOHEXOL 300 MG/ML  SOLN
75.0000 mL | Freq: Once | INTRAMUSCULAR | Status: AC | PRN
  Administered 2018-05-29: 02:00:00 75 mL via INTRAVENOUS

## 2018-05-29 MED ORDER — CEPHALEXIN 250 MG PO CAPS: 250.0000 mg | ORAL_CAPSULE | Freq: Two times a day (BID) | ORAL | 0 refills | Status: AC

## 2018-05-29 MED ORDER — ONDANSETRON 4 MG PO TBDP: 4.0000 mg | ORAL_TABLET | Freq: Four times a day (QID) | ORAL | 0 refills | Status: DC | PRN

## 2018-05-29 MED ORDER — IOHEXOL 240 MG/ML SOLN
50.0000 mL | Freq: Once | INTRAMUSCULAR | Status: AC | PRN
  Administered 2018-05-29: 02:00:00 50 mL via ORAL

## 2018-05-29 MED ORDER — ONDANSETRON HCL 4 MG/2ML IJ SOLN
4.0000 mg | Freq: Once | INTRAMUSCULAR | Status: AC
  Administered 2018-05-29: 4 mg via INTRAVENOUS
  Filled 2018-05-29: qty 2

## 2018-05-29 MED ORDER — ACETAMINOPHEN 500 MG PO TABS
1000.0000 mg | ORAL_TABLET | ORAL | Status: AC
  Administered 2018-05-29: 1000 mg via ORAL
  Filled 2018-05-29: qty 2

## 2018-05-29 MED ORDER — CEPHALEXIN 500 MG PO CAPS
500.0000 mg | ORAL_CAPSULE | Freq: Once | ORAL | Status: AC
  Administered 2018-05-29: 500 mg via ORAL
  Filled 2018-05-29: qty 1

## 2018-05-29 NOTE — ED Provider Notes (Signed)
[facility] Emergency Department Provider Note   ____________________________________________   First MD Initiated Contact with Patient 05/28/18 2349     (approximate)  I have reviewed the triage vital signs and the nursing notes.   HISTORY  Chief Complaint Abdominal Pain    HPI Cheryl Barr is a 63 y.o. female reports that she began having abdominal pain this evening  About 2 hours ago she started to feel nauseated as though she wanted to vomit.  She reports she had pain in the mid upper abdomen but it since gone away.  Little chilled but did not notice fever at home.  She did not vomit.  Denies any diarrhea.  Has been no chest pain or shortness of breath.  Reports she has been in her normal health until this evening when she does start to feel nauseated.  She reports the pain lasted for a while feeling a crampy pain mostly in the middle of her abdomen, maybe a little bit more in the upper part just above her bellybutton and is since gone away.  She reports she had a hernia surgery, but no issues there.  She still has her appendix and gallbladder.  She reports she feels quite it better now and her symptoms are gone but she not sure what caused this and was unaware that she currently has a fever.  Denies any urinary changes.  No pain or burning with urination.  Has had some urinary tract infections in the past but does not feel like it  Past Medical History:  Diagnosis Date   Arthritis    Colon polyp 2009   Depression    Diabetes mellitus    Type 2   Fibromyalgia    Gout    Hyperlipidemia    Hypertension    Insomnia    Menopause    Vitamin D deficiency     Patient Active Problem List   Diagnosis Date Noted   Incomplete rotator cuff tear or rupture of left shoulder, not specified as traumatic 07/26/2016   Thoracic aortic atherosclerosis ([place]) 01/12/2016   DDD (degenerative disc disease), 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/2016   B12 deficiency  01/11/2016   Hernia of abdominal cavity 09/15/2014   Impaired renal function 08/10/2014   Controlled type 2 diabetes with renal manifestation ([place]) 08/10/2014   Dyslipidemia 08/10/2014   Interval gout 08/10/2014   Microalbuminuria 08/10/2014   Morbid obesity ([place]) 08/10/2014   Osteoarthritis of left knee 08/10/2014   Vitamin D deficiency 08/10/2014   Cough 01/21/2014   Intraductal papilloma of breast 06/27/2013   Hypertension, essential, benign 09/02/2009   ABNORMAL EKG 09/02/2009    Past Surgical History:  Procedure Laterality Date   BREAST BIOPSY Left 05/24/2013   neg   BREAST EXCISIONAL BIOPSY Left 06/21/2013   neg/ dr. Bary Castilla   BREAST SURGERY Left 2015   COLONOSCOPY  2009   1 polyp found. Dr. Roselyn Reef   HERNIA REPAIR  16/11/9602   Ventral/umbilical hernia repair with 10 x 10 cm At[facility] mesh in preperitoneal position.   INSERTION OF MESH N/A 10/09/2014   Procedure: INSERTION OF MESH;  Surgeon: Robert Bellow, MD;  Location: [facility] ORS;  Service: General;  Laterality: N/A;   KNEE SURGERY Left 01/14/2009   STRABISMUS SURGERY     Repair as a child   TUBAL LIGATION  1990   VENTRAL HERNIA REPAIR N/A 10/09/2014   Procedure: HERNIA REPAIR VENTRAL ADULT;  Surgeon: Robert Bellow, MD;  Location: [facility] ORS;  Service: General;  Laterality: N/A;    Prior to Admission medications   Medication Sig Start Date End Date Taking? Authorizing Provider  allopurinol (ZYLOPRIM) 100 MG tablet Take 1 tablet (100 mg total) by mouth daily. 04/03/18   Steele Sizer, MD  amLODipine-olmesartan (AZOR) 10-40 MG tablet Take 1 tablet by mouth daily. 04/03/18   Steele Sizer, MD  ammonium lactate (LAC-HYDRIN) 12 % cream Apply topically as needed for dry skin. 08/23/17   Steele Sizer, MD  aspirin 81 MG EC tablet Take 81 mg by mouth daily.      [provider]  atorvastatin (LIPITOR) 40 MG tablet Take 1 tablet (40 mg total) by mouth daily. 04/03/18   Steele Sizer, MD    benzonatate (TESSALON) 100 MG capsule Take 1-2 capsules (100-200 mg total) by mouth 2 (two) times daily as needed. 04/03/18   Steele Sizer, MD  budesonide-formoterol (SYMBICORT) 160-4.5 MCG/ACT inhaler Inhale 2 puffs into the lungs 2 (two) times daily. 04/03/18   Steele Sizer, MD  carvedilol (COREG) 12.5 MG tablet Take 1 tablet (12.5 mg total) by mouth 2 (two) times daily with a meal. 04/03/18   Sowles, Drue Stager, MD  cephALEXin (KEFLEX) 250 MG capsule Take 1 capsule (250 mg total) by mouth 2 (two) times daily for 5 days. 05/29/18 06/03/18  Delman Kitten, MD  Cyanocobalamin 500 MCG SUBL Place 1 tablet under the tongue daily.    [provider]  fluticasone (FLONASE) 50 MCG/ACT nasal spray Place 2 sprays into both nostrils daily. Patient not taking: Reported on 04/03/2018 04/06/17   Hubbard Hartshorn, FNP  glucose blood (FREESTYLE LITE) test strip FREESTYLE LITE TEST (In Vitro Strip)  check fsbs once a day for 0 days  Quantity: 100.00;  Refills: 5   Ordered :31-Dec-2009  Steele Sizer MD;  Buddy Duty 02-Jan-2009 Active Comments: DX: 250.00 01/02/09   [provider]  LANCETS ULTRA THIN 30G [place]     [provider]  metFORMIN (GLUCOPHAGE-XR) 750 MG 24 hr tablet Take 1 tablet (750 mg total) by mouth daily with breakfast. 04/03/18   Ancil Boozer, Drue Stager, MD  metroNIDAZOLE (FLAGYL) 500 MG tablet Take 2 tablets (1,000 mg total) by mouth 2 (two) times daily. Patient not taking: Reported on 04/03/2018 12/01/17   Steele Sizer, MD  ondansetron (ZOFRAN ODT) 4 MG disintegrating tablet Take 1 tablet (4 mg total) by mouth every 6 (six) hours as needed for nausea or vomiting. 05/29/18   Delman Kitten, MD  pregabalin (LYRICA) 100 MG capsule Take 1 capsule (100 mg total) by mouth 2 (two) times daily. 04/03/18   Steele Sizer, MD    Allergies Patient has no known allergies.  Family History  Problem Relation Age of Onset   Hypertension Mother    Diabetes Mother    Heart attack Father     Coronary artery disease Father    Breast cancer Father 66   Allergies Son        Haematologist and Geographical information systems officer   Diabetes Sister    Diabetes Brother    Gout Brother     Social History Social History   Tobacco Use   Smoking status: Former Smoker    Types: Cigarettes    Last attempt to quit: 10/02/1983    Years since quitting: 34.6   Smokeless tobacco: Never Used  Substance Use Topics   Alcohol use: No    Alcohol/week: 0.0 standard drinks   Drug use: No    Review of Systems Constitutional: No fever some chills, now knows she has a  fever because she found it when she got here Eyes: No visual changes. ENT: No sore throat. Cardiovascular: Denies chest pain. Respiratory: Denies shortness of breath. Gastrointestinal: See HPI.  Nausea and pain have gone away now. Genitourinary: Negative for dysuria. Musculoskeletal: Negative for back pain. Skin: Negative for rash. Neurological: Negative for headaches, areas of focal weakness or numbness.    ____________________________________________   PHYSICAL EXAM:  VITAL SIGNS: ED Triage Vitals  Enc Vitals Group     BP 05/28/18 2344 (!) 171/79     Pulse Rate 05/28/18 2344 100     Resp 05/28/18 2344 18     Temp 05/28/18 2343 (!) 100.6 F (38.1 C)     Temp Source 05/28/18 2343 Oral     SpO2 05/28/18 2344 99 %     Weight --      Height --      Head Circumference --      Peak Flow --      Pain Score --      Pain Loc --      Pain Edu? --      Excl. in [place]? --     Constitutional: Alert and oriented. Well appearing and in no acute distress. Eyes: Conjunctivae are normal. Head: Atraumatic. Nose: No congestion/rhinnorhea. Mouth/Throat: Mucous membranes are moist. Neck: No stridor.  Cardiovascular: Normal rate, regular rhythm. Grossly normal heart sounds.  Good peripheral circulation. Respiratory: Normal respiratory effort.  No retractions. Lungs CTAB. Gastrointestinal: Soft and nontender for some mild tenderness in epigastrium a  little bit more in the right upper quadrant without rebound or guarding.  There is no obviously positive Murphy.  No pain to McBurney's point.  No pain in remaining quadrants.  No rebound guarding or peritonitis any quadrant. No distention. Musculoskeletal: No lower extremity tenderness nor edema. Neurologic:  Normal speech and language. No gross focal neurologic deficits are appreciated.  Skin:  Skin is warm, dry and intact. No rash noted. Psychiatric: Mood and affect are normal. Speech and behavior are normal.  ____________________________________________   LABS (all labs ordered are listed, but only abnormal results are displayed)  Labs Reviewed  LACTIC ACID, PLASMA - Abnormal; Notable for the following components:      Result Value   Lactic Acid, Venous 2.1 (*)    All other components within normal limits  COMPREHENSIVE METABOLIC PANEL - Abnormal; Notable for the following components:   Potassium 3.4 (*)    Glucose, Bld 163 (*)    BUN 27 (*)    Creatinine, Ser 1.52 (*)    Total Protein 8.3 (*)    GFR calc non Af Amer 36 (*)    GFR calc Af Amer 42 (*)    All other components within normal limits  CBC WITH DIFFERENTIAL/PLATELET - Abnormal; Notable for the following components:   WBC 13.6 (*)    Hemoglobin 11.0 (*)    HCT 34.5 (*)    RDW 15.9 (*)    Neutro Abs 11.5 (*)    All other components within normal limits  URINALYSIS, COMPLETE (UACMP) WITH MICROSCOPIC - Abnormal; Notable for the following components:   Color, Urine YELLOW (*)    APPearance CLOUDY (*)    Hgb urine dipstick SMALL (*)    Protein, ur 100 (*)    Bacteria, UA MANY (*)    All other components within normal limits  CULTURE, BLOOD (ROUTINE X 2)  CULTURE, BLOOD (ROUTINE X 2)  URINE CULTURE  LIPASE, BLOOD  PROTIME-INR   ____________________________________________  EKG   ____________________________________________  RADIOLOGY  Ct Abdomen Pelvis W Contrast  Result Date: 05/29/2018 CLINICAL DATA:   Lower abdominal pain for 1 hour EXAM: CT ABDOMEN AND PELVIS WITH CONTRAST TECHNIQUE: Multidetector CT imaging of the abdomen and pelvis was performed using the standard protocol following bolus administration of intravenous contrast. CONTRAST:  27mL OMNIPAQUE 300 COMPARISON:  None. FINDINGS: Lower chest: No acute abnormality. Hepatobiliary: Mild fatty infiltration of the liver is noted. The gallbladder is within normal limits. Pancreas: Unremarkable. No pancreatic ductal dilatation or surrounding inflammatory changes. Spleen: Normal in size without focal abnormality. Adrenals/Urinary Tract: Adrenal glands are within normal limits. Kidneys are well visualized bilaterally. 2 cm cyst is noted within the upper pole of the left kidney. No renal calculi or obstructive changes are seen. Bladder is well distended. Stomach/Bowel: The appendix is within normal limits. No obstructive or inflammatory changes of large or small bowel are seen. The stomach is within normal limits. Vascular/Lymphatic: No significant vascular findings are present. No enlarged abdominal or pelvic lymph nodes. Reproductive: Uterus is bulky likely related to fibroid change. Ovarian cystic changes are noted bilaterally. The largest of these lies on the left measuring approximately 5.3 cm in greatest dimension. Other: No ascites is noted. Some minimal irregularity of the of the anterior abdominal wall is noted just above the umbilicus concerning for very early herniation. Musculoskeletal: Degenerative changes of the lumbar spine are noted. IMPRESSION: No acute abnormality identified. Uterine fibroid change. Irregularity of the anterior abdominal wall just above the umbilicus concerning for herniation. Only minimal omental fat is noted within. Electronically Signed   By: Inez Catalina M.D.   On: 05/29/2018 02:47   US Abdomen Limited Ruq  Result Date: 05/29/2018 CLINICAL DATA:  Right upper quadrant pain EXAM: ULTRASOUND ABDOMEN LIMITED RIGHT UPPER  QUADRANT COMPARISON:  CT from earlier in the same day. FINDINGS: Gallbladder: No gallstones or wall thickening visualized. No sonographic Murphy sign noted by sonographer. Common bile duct: Diameter: 3 mm Liver: Mild increased echogenicity is noted consistent with fatty infiltration. No focal mass is noted. Portal vein is patent on color Doppler imaging with normal direction of blood flow towards the liver. IMPRESSION: Mild fatty infiltration of the liver. Normal appearing gallbladder. Electronically Signed   By: Inez Catalina M.D.   On: 05/29/2018 03:53      ____________________________________________   PROCEDURES  Procedure(s) performed: None  Procedures  Critical Care performed: No  ____________________________________________   INITIAL IMPRESSION / ASSESSMENT AND PLAN / ED COURSE  Pertinent labs & imaging results that were available during my care of the patient were reviewed by me and considered in my medical decision making (see chart for details).   Differential diagnosis includes but is not limited to, abdominal perforation, aortic dissection, cholecystitis, appendicitis, diverticulitis, colitis, esophagitis/gastritis, kidney stone, pyelonephritis, urinary tract infection, aortic aneurysm. All are considered in decision and treatment plan. Based upon the patient's presentation and risk factors, given her febrile status and slight tachycardia will provide her an antiemetic should nausea come back, also provide Tylenol and check LFTs, basic labs and given her age and the location of pain we need to proceed with CT imaging to further evaluate exclude etiology such as early cholecystitis, pancreatitis, hepatitis, or other intra-abdominal acute etiology.  With her fever I am suspicious for an infectious process.  Etiology uncertain but she does report being asymptomatic now and feeling better but is still febrile.  Cheryl Barr was evaluated in Emergency Department on 05/29/2018 for the  symptoms described  in the history of present illness. She was evaluated in the context of the global COVID-19 pandemic, which necessitated consideration that the patient might be at risk for infection with the SARS-CoV-2 virus that causes COVID-19. Institutional protocols and algorithms that pertain to the evaluation of patients at risk for COVID-19 are in a state of rapid change based on information released by regulatory bodies including the CDC and federal and state organizations. These policies and algorithms were followed during the patient's care in the ED.  Symptoms seem very atypical of coronavirus, doubtful.  Clinical Course as of May 28 417  Tue May 29, 2018  0128 Patient reports she is pain and symptom-free at this time.  CT pending.  She is doing well resting comfortably.   [MQ]  0302 Fever regressed. Pain and symptom free. State she feels perfectly fine. Understands plan to obtain RUQ u/s. If this looks normal, plan to send home. Wish to be certain to radiolucent or other acute biliary process given elevated WBC. If neg, consider abx due to bacteria in urine... ? Uti, though not convincing.    [MQ]    Clinical Course User Index [MQ] Delman Kitten, MD   ----------------------------------------- 4:19 AM on 05/29/2018 -----------------------------------------  Right upper quadrant ultrasound reassuring as well as her CT scan.  She has a known previous ventral hernia is not having any abdominal pain at this time, no evidence of hernia on exam.  She is completely pain and symptom-free.  Appears appropriate for discharge, query if this could be urinary tract infection and will treat her with antibiotic of abundance of caution and also do febrile status, otherwise may very well represent a viral gastrointestinal etiology.  Symptoms would be unlikely to represent coronavirus, much more likely to represent self-limited gastrointestinal illness.  Return precautions and treatment recommendations  and follow-up discussed with the patient who is agreeable with the plan.  Cheryl Barr was evaluated in Emergency Department on 05/29/2018 for the symptoms described in the history of present illness. She was evaluated in the context of the global COVID-19 pandemic, which necessitated consideration that the patient might be at risk for infection with the SARS-CoV-2 virus that causes COVID-19. Institutional protocols and algorithms that pertain to the evaluation of patients at risk for COVID-19 are in a state of rapid change based on information released by regulatory bodies including the CDC and federal and state organizations. These policies and algorithms were followed during the patient's care in the ED.   ____________________________________________   FINAL CLINICAL IMPRESSION(S) / ED DIAGNOSES  Final diagnoses:  Abdominal pain  Lower urinary tract infection, acute        Note:  This document was prepared using Dragon voice recognition software and may include unintentional dictation errors       Delman Kitten, MD 05/29/18 [phone]

## 2018-05-29 NOTE — ED Notes (Signed)
Date and time results received: 05/29/18 12:49 AM  (use smartphrase ".now" to insert current time)  Test: lactic acid Critical Value: 2.1  Name of Provider Notified:MD Quale  Orders Received? Or Actions Taken?: Actions Taken: MD Quale notifed of critical lactic acid

## 2018-05-30 ENCOUNTER — Ambulatory Visit (INDEPENDENT_AMBULATORY_CARE_PROVIDER_SITE_OTHER): Payer: Commercial Managed Care - PPO | Admitting: Family Medicine

## 2018-05-30 ENCOUNTER — Encounter: Payer: Self-pay | Admitting: Family Medicine

## 2018-05-30 ENCOUNTER — Other Ambulatory Visit: Payer: Self-pay

## 2018-05-30 VITALS — BP 100/68 | HR 92 | Temp 99.6°F | Resp 18 | Ht 67.0 in | Wt 211.1 lb

## 2018-05-30 DIAGNOSIS — R7989 Other specified abnormal findings of blood chemistry: Secondary | ICD-10-CM | POA: Diagnosis not present

## 2018-05-30 DIAGNOSIS — R031 Nonspecific low blood-pressure reading: Secondary | ICD-10-CM

## 2018-05-30 DIAGNOSIS — D72829 Elevated white blood cell count, unspecified: Secondary | ICD-10-CM

## 2018-05-30 DIAGNOSIS — R439 Unspecified disturbances of smell and taste: Secondary | ICD-10-CM

## 2018-05-30 LAB — URINE CULTURE
Culture: <10000 — AB
Special Requests: NORMAL

## 2018-05-30 NOTE — Patient Instructions (Signed)
Person Under Monitoring Name: Cheryl Barr  Location: [place] [address]   Infection Prevention Recommendations for Individuals Confirmed to have, or Being Evaluated for, 2019 Novel Coronavirus (COVID-19) Infection Who Receive Care at Home  Individuals who are confirmed to have, or are being evaluated for, COVID-19 should follow the prevention steps below until a healthcare provider or local or state health department says they can return to normal activities.  Stay home except to get medical care You should restrict activities outside your home, except for getting medical care. Do not go to work, school, or public areas, and do not use public transportation or taxis.  Call ahead before visiting your doctor Before your medical appointment, call the healthcare provider and tell them that you have, or are being evaluated for, COVID-19 infection. This will help the healthcare provider's office take steps to keep other people from getting infected. Ask your healthcare provider to call the local or state health department.  Monitor your symptoms Seek prompt medical attention if your illness is worsening (e.g., difficulty breathing). Before going to your medical appointment, call the healthcare provider and tell them that you have, or are being evaluated for, COVID-19 infection. Ask your healthcare provider to call the local or state health department.  Wear a facemask You should wear a facemask that covers your nose and mouth when you are in the same room with other people and when you visit a healthcare provider. People who live with or visit you should also wear a facemask while they are in the same room with you.  Separate yourself from other people in your home As much as possible, you should stay in a different room from other people in your home. Also, you should use a separate bathroom, if available.  Avoid sharing household items You should not share  dishes, drinking glasses, cups, eating utensils, towels, bedding, or other items with other people in your home. After using these items, you should wash them thoroughly with soap and water.  Cover your coughs and sneezes Cover your mouth and nose with a tissue when you cough or sneeze, or you can cough or sneeze into your sleeve. Throw used tissues in a lined trash can, and immediately wash your hands with soap and water for at least 20 seconds or use an alcohol-based hand rub.  Wash your [facility] your hands often and thoroughly with soap and water for at least 20 seconds. You can use an alcohol-based hand sanitizer if soap and water are not available and if your hands are not visibly dirty. Avoid touching your eyes, nose, and mouth with unwashed hands.   Prevention Steps for Caregivers and Household Members of Individuals Confirmed to have, or Being Evaluated for, COVID-19 Infection Being Cared for in the Home  If you live with, or provide care at home for, a person confirmed to have, or being evaluated for, COVID-19 infection please follow these guidelines to prevent infection:  Follow healthcare provider's instructions Make sure that you understand and can help the patient follow any healthcare provider instructions for all care.  Provide for the patient's basic needs You should help the patient with basic needs in the home and provide support for getting groceries, prescriptions, and other personal needs.  Monitor the patient's symptoms If they are getting sicker, call his or her medical provider and tell them that the patient has, or is being evaluated for, COVID-19 infection. This will help the healthcare provider's office take  steps to keep other people from getting infected. Ask the healthcare provider to call the local or state health department.  Limit the number of people who have contact with the patient  If possible, have only one caregiver for the patient.  Other  household members should stay in another home or place of residence. If this is not possible, they should stay  in another room, or be separated from the patient as much as possible. Use a separate bathroom, if available.  Restrict visitors who do not have an essential need to be in the home.  Keep older adults, very young children, and other sick people away from the patient Keep older adults, very young children, and those who have compromised immune systems or chronic health conditions away from the patient. This includes people with chronic heart, lung, or kidney conditions, diabetes, and cancer.  Ensure good ventilation Make sure that shared spaces in the home have good air flow, such as from an air conditioner or an opened window, weather permitting.  Wash your hands often  Wash your hands often and thoroughly with soap and water for at least 20 seconds. You can use an alcohol based hand sanitizer if soap and water are not available and if your hands are not visibly dirty.  Avoid touching your eyes, nose, and mouth with unwashed hands.  Use disposable paper towels to dry your hands. If not available, use dedicated cloth towels and replace them when they become wet.  Wear a facemask and gloves  Wear a disposable facemask at all times in the room and gloves when you touch or have contact with the patient's blood, body fluids, and/or secretions or excretions, such as sweat, saliva, sputum, nasal mucus, vomit, urine, or feces.  Ensure the mask fits over your nose and mouth tightly, and do not touch it during use.  Throw out disposable facemasks and gloves after using them. Do not reuse.  Wash your hands immediately after removing your facemask and gloves.  If your personal clothing becomes contaminated, carefully remove clothing and launder. Wash your hands after handling contaminated clothing.  Place all used disposable facemasks, gloves, and other waste in a lined container before  disposing them with other household waste.  Remove gloves and wash your hands immediately after handling these items.  Do not share dishes, glasses, or other household items with the patient  Avoid sharing household items. You should not share dishes, drinking glasses, cups, eating utensils, towels, bedding, or other items with a patient who is confirmed to have, or being evaluated for, COVID-19 infection.  After the person uses these items, you should wash them thoroughly with soap and water.  Wash laundry thoroughly  Immediately remove and wash clothes or bedding that have blood, body fluids, and/or secretions or excretions, such as sweat, saliva, sputum, nasal mucus, vomit, urine, or feces, on them.  Wear gloves when handling laundry from the patient.  Read and follow directions on labels of laundry or clothing items and detergent. In general, wash and dry with the warmest temperatures recommended on the label.  Clean all areas the individual has used often  Clean all touchable surfaces, such as counters, tabletops, doorknobs, bathroom fixtures, toilets, phones, keyboards, tablets, and bedside tables, every day. Also, clean any surfaces that may have blood, body fluids, and/or secretions or excretions on them.  Wear gloves when cleaning surfaces the patient has come in contact with.  Use a diluted bleach solution (e.g., dilute bleach with 1 part bleach  and 10 parts water) or a household disinfectant with a label that says EPA-registered for coronaviruses. To make a bleach solution at home, add 1 tablespoon of bleach to 1 quart (4 cups) of water. For a larger supply, add  cup of bleach to 1 gallon (16 cups) of water.  Read labels of cleaning products and follow recommendations provided on product labels. Labels contain instructions for safe and effective use of the cleaning product including precautions you should take when applying the product, such as wearing gloves or eye protection  and making sure you have good ventilation during use of the product.  Remove gloves and wash hands immediately after cleaning.  Monitor yourself for signs and symptoms of illness Caregivers and household members are considered close contacts, should monitor their health, and will be asked to limit movement outside of the home to the extent possible. Follow the monitoring steps for close contacts listed on the symptom monitoring form.   ? If you have additional questions, contact your local health department or call the epidemiologist on call at [phone] (available 24/7). ? This guidance is subject to change. For the most up-to-date guidance from [facility], please refer to their website: [email or web address]

## 2018-05-30 NOTE — Progress Notes (Signed)
Name: Cheryl Barr   MRN: [id number]    DOB: April 29, 1955   Date:05/30/2018       Progress Note  Subjective  Chief Complaint  Chief Complaint  Patient presents with  . Sore Throat    Onset-Tuesday morning, trouble swallowing.   . Fever    Low grade fever was diagnosed by ER with a UTI and given Keflex and Zofran.    HPI  EC follow up: patient went to [facility] two night ago  because she had chills and nausea. The report from [facility] states abdominal pain , but she states no longer having pain . She was found to have a fever 100.6, lactic acid was 2.1 ( elevated ), WBC also high at 13.6, CT reviewed showed small umbilical hernia, fatty liver but not other significant findings. Patient was sent home on zofran and keflex for UTI., however urine culture negative. She states she is feeling better but now she has a sore throat and change in taste. She has not been eating or drinking much because of that. She is dizzy when she stands up and bp was very low upon arrival .   Patient Active Problem List   Diagnosis Date Noted  . Incomplete rotator cuff tear or rupture of left shoulder, not specified as traumatic 07/26/2016  . Thoracic aortic atherosclerosis ([place]) 01/12/2016  . 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 B12 deficiency 01/11/2016  . Hernia of abdominal cavity 09/15/2014  . Impaired renal function 08/10/2014  . 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 Dyslipidemia 08/10/2014  . Interval gout 08/10/2014  . Microalbuminuria 08/10/2014  . Morbid obesity ([place]) 08/10/2014  . Osteoarthritis of left knee 08/10/2014  . Vitamin D deficiency 08/10/2014  . Cough 01/21/2014  . Intraductal papilloma of breast 06/27/2013  . Hypertension, essential, benign 09/02/2009  . ABNORMAL EKG 09/02/2009    Past Surgical History:  Procedure Laterality Date  . BREAST BIOPSY Left 05/24/2013   neg  . BREAST EXCISIONAL BIOPSY Left 06/21/2013   neg/ dr. Bary Castilla  .  BREAST SURGERY Left 2015  . COLONOSCOPY  2009   1 polyp found. Dr. Roselyn Reef  . HERNIA REPAIR  23/53/6144   Ventral/umbilical hernia repair with 10 x 10 cm Atrium mesh in preperitoneal position.  . INSERTION OF MESH N/A 10/09/2014   Procedure: INSERTION OF MESH;  Surgeon: Robert Bellow, MD;  Location: [facility] ORS;  Service: General;  Laterality: N/A;  . KNEE SURGERY Left 01/14/2009  . STRABISMUS SURGERY     Repair as a child  . TUBAL LIGATION  1990  . VENTRAL HERNIA REPAIR N/A 10/09/2014   Procedure: HERNIA REPAIR VENTRAL ADULT;  Surgeon: Robert Bellow, MD;  Location: [facility] ORS;  Service: General;  Laterality: N/A;    Family History  Problem Relation Age of Onset  . Hypertension Mother   . Diabetes Mother   . Heart attack Father   . Coronary artery disease Father   . Breast cancer Father 48  . Allergies Son        Barista  . Diabetes Sister   . Diabetes Brother   . Gout Brother     Social History   Socioeconomic History  . Marital status: Widowed    Spouse name: Not on file  . Number of children: 3  . Years of education: 12th Grade  . Highest education level: Not on file  Occupational History    Employer: [employer]  Needs  . Financial resource strain: Not hard at all  . Food insecurity:    Worry: Never true    Inability: Never true  . Transportation needs:    Medical: No    Non-medical: No  Tobacco Use  . Smoking status: Former Smoker    Types: Cigarettes    Last attempt to quit: 10/02/1983    Years since quitting: 34.6  . Smokeless tobacco: Never Used  Substance and Sexual Activity  . Alcohol use: No    Alcohol/week: 0.0 standard drinks  . Drug use: No  . Sexual activity: Yes    Partners: Male    Birth control/protection: None  Lifestyle  . Physical activity:    Days per week: 3 days    Minutes per session: 60 min  . Stress: Rather much  Relationships  . Social connections:    Talks on phone: More than three times a week     Gets together: More than three times a week    Attends religious service: More than 4 times per year    Active member of club or organization: Yes    Attends meetings of clubs or organizations: More than 4 times per year    Relationship status: Widowed  . Intimate partner violence:    Fear of current or ex partner: No    Emotionally abused: No    Physically abused: No    Forced sexual activity: No  Other Topics Concern  . Not on file  Social History Narrative   Widowed.    No regular exercise.     Current Outpatient Medications:  .  allopurinol (ZYLOPRIM) 100 MG tablet, Take 1 tablet (100 mg total) by mouth daily., Disp: 90 tablet, Rfl: 1 .  amLODipine-olmesartan (AZOR) 10-40 MG tablet, Take 1 tablet by mouth daily., Disp: 90 tablet, Rfl: 1 .  ammonium lactate (LAC-HYDRIN) 12 % cream, Apply topically as needed for dry skin., Disp: 385 g, Rfl: 0 .  aspirin 81 MG EC tablet, Take 81 mg by mouth daily.  , Disp: , Rfl:  .  atorvastatin (LIPITOR) 40 MG tablet, Take 1 tablet (40 mg total) by mouth daily., Disp: 90 tablet, Rfl: 1 .  benzonatate (TESSALON) 100 MG capsule, Take 1-2 capsules (100-200 mg total) by mouth 2 (two) times daily as needed., Disp: 40 capsule, Rfl: 0 .  budesonide-formoterol (SYMBICORT) 160-4.5 MCG/ACT inhaler, Inhale 2 puffs into the lungs 2 (two) times daily., Disp: 1 Inhaler, Rfl: 2 .  carvedilol (COREG) 12.5 MG tablet, Take 1 tablet (12.5 mg total) by mouth 2 (two) times daily with a meal., Disp: 180 tablet, Rfl: 1 .  cephALEXin (KEFLEX) 250 MG capsule, Take 1 capsule (250 mg total) by mouth 2 (two) times daily for 5 days., Disp: 10 capsule, Rfl: 0 .  Cyanocobalamin 500 MCG SUBL, Place 1 tablet under the tongue daily., Disp: , Rfl:  .  fluticasone (FLONASE) 50 MCG/ACT nasal spray, Place 2 sprays into both nostrils daily., Disp: 16 g, Rfl: 6 .  glucose blood (FREESTYLE LITE) test strip, FREESTYLE LITE TEST (In Vitro Strip)  check fsbs once a day for 0 days  Quantity:  100.00;  Refills: 5   Ordered :31-Dec-2009  Steele Sizer MD;  Dani Gobble Active Comments: DX: 250.00, Disp: , Rfl:  .  LANCETS ULTRA THIN 30G MISC, , Disp: , Rfl:  .  metFORMIN (GLUCOPHAGE-XR) 750 MG 24 hr tablet, Take 1 tablet (750 mg total) by mouth daily with breakfast., Disp: 90 tablet, Rfl:  1 .  metroNIDAZOLE (FLAGYL) 500 MG tablet, Take 2 tablets (1,000 mg total) by mouth 2 (two) times daily., Disp: 4 tablet, Rfl: 0 .  ondansetron (ZOFRAN ODT) 4 MG disintegrating tablet, Take 1 tablet (4 mg total) by mouth every 6 (six) hours as needed for nausea or vomiting., Disp: 20 tablet, Rfl: 0 .  pregabalin (LYRICA) 100 MG capsule, Take 1 capsule (100 mg total) by mouth 2 (two) times daily., Disp: 180 capsule, Rfl: 1  No Known Allergies  I personally reviewed active problem list, medication list, allergies with the patient/caregiver today.   ROS  Ten systems reviewed and is negative except as mentioned in HPI   Objective  Vitals:   05/30/18 1419 05/30/18 1425  BP: (!) 104/56 100/68  Pulse: 92   Resp: 18   Temp: 99.6 F (37.6 C)   TempSrc: Oral   SpO2: 98%   Weight: 211 lb 1.6 oz (95.8 kg)   Height: 5\' 7"  (1.702 m)     Body mass index is 33.06 kg/m.  Physical Exam  Constitutional: Patient appears well-developed and well-nourished. Obese  No distress.  HEENT: head atraumatic, normocephalic, pupils equal and reactive to light, strabismus,  neck supple, throat within normal limits Cardiovascular: Normal rate, regular rhythm and normal heart sounds.  No murmur heard. No BLE edema.  Pulmonary/Chest: Effort normal and breath sounds normal. No respiratory distress. Abdominal: Soft.  There is no tenderness. Psychiatric: Patient has a normal mood and affect. behavior is normal. Judgment and thought content normal. Neurological: no tremors, felt dizzy when first stood up and held on to the table, normal gait   Recent Results (from the past 2160 hour(s))  Urinalysis, Complete      Status: Abnormal   Collection Time: 03/01/18  3:39 PM  Result Value Ref Range   Specific Gravity, UA 1.025 1.005 - 1.030   pH, UA 5.5 5.0 - 7.5   Color, UA Yellow Yellow   Appearance Ur Cloudy (A) Clear   Leukocytes, UA Trace (A) Negative   Protein, UA 3+ (A) Negative/Trace   Glucose, UA Negative Negative   Ketones, UA Trace (A) Negative   RBC, UA Trace (A) Negative   Bilirubin, UA Negative Negative   Urobilinogen, Ur 0.2 0.2 - 1.0 mg/dL   Nitrite, UA Negative Negative   Microscopic Examination See below:   Microscopic Examination     Status: Abnormal   Collection Time: 03/01/18  3:39 PM  Result Value Ref Range   WBC, UA 0-5 0 - 5 /hpf   RBC, UA None seen 0 - 2 /hpf   Epithelial Cells (non renal) 0-10 0 - 10 /hpf   Casts Present (A) None seen /lpf   Cast Type Hyaline casts N/A   Mucus, UA Present (A) Not Estab.   Bacteria, UA Many (A) None seen/Few  POCT HgB A1C     Status: Abnormal   Collection Time: 04/03/18  1:43 PM  Result Value Ref Range   Hemoglobin A1C 6.6 (A) 4.0 - 5.6 %   HbA1c POC (<> result, manual entry)     HbA1c, POC (prediabetic range)     HbA1c, POC (controlled diabetic range)    RPR     Status: None   Collection Time: 04/03/18  2:14 PM  Result Value Ref Range   RPR Ser Ql NON-REACTIVE NON-REACTI  HIV Antibody (routine testing w rflx)     Status: None   Collection Time: 04/03/18  2:14 PM  Result Value Ref Range  HIV 1&2 Ab, 4th Generation NON-REACTIVE NON-REACTI    Comment: HIV-1 antigen and HIV-1/HIV-2 antibodies were not detected. There is no laboratory evidence of HIV infection. Marland Kitchen PLEASE NOTE: This information has been disclosed to you from records whose confidentiality may be protected by state law.  If your state requires such protection, then the state law prohibits you from making any further disclosure of the information without the specific written consent of the person to whom it pertains, or as otherwise permitted by law. A general  authorization for the release of medical or other information is NOT sufficient for this purpose. . For additional information please refer to http://education.questdiagnostics.com/faq/FAQ106 (This link is being provided for informational/ educational purposes only.) . Marland Kitchen The performance of this assay has not been clinically validated in patients less than 64 years old. Marland Kitchen   Microalbumin / creatinine urine ratio     Status: None   Collection Time: 04/03/18  2:14 PM  Result Value Ref Range   Creatinine, Urine 261 20 - 275 mg/dL   Microalb, Ur >600.0 mg/dL    Comment: Verified by repeat analysis. Marland Kitchen Reference Range Not established    Microalb Creat Ratio  <30 mcg/mg creat    Comment: THE URINE ALBUMIN VALUE IS GREATER THAN 600 mg/dL THEREFORE WE ARE UNABLE TO CALCULATE EXCRETION  AND/OR CREATININE RATIO. Marland Kitchen The ADA defines abnormalities in albumin excretion as follows: Marland Kitchen Category         Result (mcg/mg creatinine) . Normal                    <30 Microalbuminuria         30-299  Clinical albuminuria   > OR = 300 . The ADA recommends that at least two of three specimens collected within a 3-6 month period be abnormal before considering a patient to be within a diagnostic category.   Urine culture     Status: Abnormal   Collection Time: 05/28/18 12:05 AM  Result Value Ref Range   Specimen Description      URINE, RANDOM Performed at [facility], [address], [place], [place] [address]    Special Requests      Normal Performed at [facility], [address] [place], [place] [address]    Culture (A)     <10,000 COLONIES/mL INSIGNIFICANT GROWTH Performed at [facility] [address], [place], [place] [address]    Report Status 05/30/2018 FINAL   Lactic acid, plasma     Status: Abnormal   Collection Time: 05/29/18 12:05 AM  Result Value Ref Range   Lactic Acid, Venous 2.1 (HH) 0.5 - 1.9 mmol/L    Comment: CRITICAL RESULT CALLED TO,  READ BACK BY AND VERIFIED WITH JESSICA AGUAS AT 0046 05/29/2018 SMA Performed at [facility], [address] [place], [place] [address]   Comprehensive metabolic panel     Status: Abnormal   Collection Time: 05/29/18 12:05 AM  Result Value Ref Range   Sodium 142 135 - 145 mmol/L   Potassium 3.4 (L) 3.5 - 5.1 mmol/L   Chloride 104 98 - 111 mmol/L   CO2 26 22 - 32 mmol/L   Glucose, Bld 163 (H) 70 - 99 mg/dL   BUN 27 (H) 8 - 23 mg/dL   Creatinine, Ser 1.52 (H) 0.44 - 1.00 mg/dL   Calcium 9.2 8.9 - 10.3 mg/dL   Total Protein 8.3 (H) 6.5 - 8.1 g/dL   Albumin 3.8 3.5 -  5.0 g/dL   AST 17 15 - 41 U/L   ALT 18 0 - 44 U/L   Alkaline Phosphatase 94 38 - 126 U/L   Total Bilirubin 0.6 0.3 - 1.2 mg/dL   GFR calc non Af Amer 36 (L) >60 mL/min   GFR calc Af Amer 42 (L) >60 mL/min   Anion gap 12 5 - 15    Comment: Performed at [facility], [address] [place], [place] [address]  Lipase, blood     Status: None   Collection Time: 05/29/18 12:05 AM  Result Value Ref Range   Lipase 17 11 - 51 U/L    Comment: Performed at [facility], [address] [place], [place] [address]  CBC WITH DIFFERENTIAL     Status: Abnormal   Collection Time: 05/29/18 12:05 AM  Result Value Ref Range   WBC 13.6 (H) 4.0 - 10.5 K/uL   RBC 4.20 3.87 - 5.11 MIL/uL   Hemoglobin 11.0 (L) 12.0 - 15.0 g/dL   HCT 34.5 (L) 36.0 - 46.0 %   MCV 82.1 80.0 - 100.0 fL   MCH 26.2 26.0 - 34.0 pg   MCHC 31.9 30.0 - 36.0 g/dL   RDW 15.9 (H) 11.5 - 15.5 %   Platelets 309 150 - 400 K/uL   nRBC 0.0 0.0 - 0.2 %   Neutrophils Relative % 84 %   Neutro Abs 11.5 (H) 1.7 - 7.7 K/uL   Lymphocytes Relative 11 %   Lymphs Abs 1.5 0.7 - 4.0 K/uL   Monocytes Relative 4 %   Monocytes Absolute 0.5 0.1 - 1.0 K/uL   Eosinophils Relative 0 %   Eosinophils Absolute 0.0 0.0 - 0.5 K/uL   Basophils Relative 0 %   Basophils Absolute 0.0 0.0 - 0.1 K/uL   Immature Granulocytes 1 %   Abs Immature Granulocytes 0.07  0.00 - 0.07 K/uL    Comment: Performed at [facility], [address] [place], [place] [address]  Protime-INR     Status: None   Collection Time: 05/29/18 12:05 AM  Result Value Ref Range   Prothrombin Time 13.1 11.4 - 15.2 seconds   INR 1.0 0.8 - 1.2    Comment: (NOTE) INR goal varies based on device and disease states. Performed at [facility], [address] [place], [place] [address]   Urinalysis, Complete w Microscopic     Status: Abnormal   Collection Time: 05/29/18 12:05 AM  Result Value Ref Range   Color, Urine YELLOW (A) YELLOW   APPearance CLOUDY (A) CLEAR   Specific Gravity, Urine 1.013 1.005 - 1.030   pH 6.0 5.0 - 8.0   Glucose, UA NEGATIVE NEGATIVE mg/dL   Hgb urine dipstick SMALL (A) NEGATIVE   Bilirubin Urine NEGATIVE NEGATIVE   Ketones, ur NEGATIVE NEGATIVE mg/dL   Protein, ur 100 (A) NEGATIVE mg/dL   Nitrite NEGATIVE NEGATIVE   Leukocytes,Ua NEGATIVE NEGATIVE   RBC / HPF 0-5 0 - 5 RBC/hpf   WBC, UA 6-10 0 - 5 WBC/hpf   Bacteria, UA MANY (A) NONE SEEN   Squamous Epithelial / LPF 0-5 0 - 5   Mucus PRESENT     Comment: Performed at [facility], [address] [place], [place] [address]  Blood Culture (routine x 2)     Status: None (Preliminary result)   Collection Time: 05/29/18 12:07 AM  Result Value Ref Range   Specimen Description BLOOD LEFT ANTECUBITAL    Special Requests  BOTTLES DRAWN AEROBIC AND ANAEROBIC Blood Culture results may not be optimal due to an excessive volume of blood received in culture bottles   Culture      NO GROWTH 1 DAY Performed at [facility], [address] [place], [place] [address]    Report Status PENDING   Blood Culture (routine x 2)     Status: None (Preliminary result)   Collection Time: 05/29/18 12:38 AM  Result Value Ref Range   Specimen Description BLOOD BLOOD RIGHT HAND    Special Requests      BOTTLES DRAWN AEROBIC AND ANAEROBIC Blood Culture results may not be  optimal due to an inadequate volume of blood received in culture bottles   Culture      NO GROWTH 1 DAY Performed at [facility], [address] [place], [place] [address]    Report Status PENDING       PHQ2/9: Depression screen [facility] 2/9 05/30/2018 04/03/2018 01/04/2018 12/01/2017 10/30/2017  Decreased Interest 0 0 0 0 0  Down, Depressed, Hopeless 0 0 2 0 0  PHQ - 2 Score 0 0 2 0 0  Altered sleeping 1 - 2 0 0  Tired, decreased energy 1 - 0 0 0  Change in appetite 0 - 0 0 0  Feeling bad or failure about yourself  0 - 0 0 0  Trouble concentrating 0 - 0 0 0  Moving slowly or fidgety/restless 0 - 0 0 0  Suicidal thoughts 0 - 0 0 0  PHQ-9 Score 2 - 4 0 0  Difficult doing work/chores Not difficult at all - Not difficult at all Not difficult at all Not difficult at all    phq 9 is negative   Fall Risk: Fall Risk  05/30/2018 04/03/2018 01/04/2018 12/01/2017 10/30/2017  Falls in the past year? 0 0 0 No No  Number falls in past yr: 0 0 - - -  Injury with Fall? 0 0 - - -     Functional Status Survey: Is the patient deaf or have difficulty hearing?: No Does the patient have difficulty seeing, even when wearing glasses/contacts?: Yes Does the patient have difficulty concentrating, remembering, or making decisions?: No Does the patient have difficulty walking or climbing stairs?: No Does the patient have difficulty dressing or bathing?: No Does the patient have difficulty doing errands alone such as visiting a doctor's office or shopping?: No    Assessment & Plan  1. Leukocytosis, unspecified type  - CBC with Differential/Platelet  2. Low blood pressure reading  - COMPLETE METABOLIC PANEL WITH GFR But no tachycardia and feeling better  3. Elevated lactic acid level  - Lactic acid, plasma  4. Taste disorder  Explained that she must eat and drink, bp is very low, get up slowly and stay hydrated, recheck visit in 48 hours, go to [facility] if worsening of symptoms.    Discussed COVID-19 symptoms , but since better we will not get her tested today

## 2018-05-31 ENCOUNTER — Other Ambulatory Visit: Payer: Self-pay | Admitting: Family Medicine

## 2018-05-31 DIAGNOSIS — N179 Acute kidney failure, unspecified: Secondary | ICD-10-CM

## 2018-05-31 DIAGNOSIS — D72829 Elevated white blood cell count, unspecified: Secondary | ICD-10-CM

## 2018-05-31 DIAGNOSIS — N189 Chronic kidney disease, unspecified: Secondary | ICD-10-CM

## 2018-05-31 DIAGNOSIS — R7989 Other specified abnormal findings of blood chemistry: Secondary | ICD-10-CM

## 2018-06-01 ENCOUNTER — Encounter: Payer: Self-pay | Admitting: Family Medicine

## 2018-06-01 ENCOUNTER — Other Ambulatory Visit: Payer: Self-pay

## 2018-06-01 ENCOUNTER — Other Ambulatory Visit
Admission: RE | Admit: 2018-06-01 | Discharge: 2018-06-01 | Disposition: A | Discharge status: Home / Self Care | Payer: Commercial Managed Care - PPO | Source: Ambulatory Visit | Attending: Family Medicine | Admitting: Family Medicine

## 2018-06-01 ENCOUNTER — Ambulatory Visit (INDEPENDENT_AMBULATORY_CARE_PROVIDER_SITE_OTHER): Payer: Commercial Managed Care - PPO | Admitting: Family Medicine

## 2018-06-01 VITALS — BP 150/90 | HR 76

## 2018-06-01 DIAGNOSIS — R7989 Other specified abnormal findings of blood chemistry: Secondary | ICD-10-CM | POA: Insufficient documentation

## 2018-06-01 DIAGNOSIS — N179 Acute kidney failure, unspecified: Secondary | ICD-10-CM

## 2018-06-01 DIAGNOSIS — D72829 Elevated white blood cell count, unspecified: Secondary | ICD-10-CM | POA: Diagnosis present

## 2018-06-01 DIAGNOSIS — N189 Chronic kidney disease, unspecified: Secondary | ICD-10-CM

## 2018-06-01 DIAGNOSIS — R031 Nonspecific low blood-pressure reading: Secondary | ICD-10-CM

## 2018-06-01 DIAGNOSIS — E876 Hypokalemia: Secondary | ICD-10-CM

## 2018-06-01 LAB — CBC WITH DIFFERENTIAL/PLATELET
Abs Immature Granulocytes: 0.05 10*3/uL (ref 0.00–0.07)
Absolute Monocytes: 1003 cells/uL — ABNORMAL HIGH (ref 200–950)
Basophils Absolute: 35 cells/uL (ref 0–200)
Basophils Absolute: 0 10*3/uL (ref 0.0–0.1)
Basophils Relative: 0.2 %
Basophils Relative: 1 %
Eosinophils Absolute: 35 cells/uL (ref 15–500)
Eosinophils Absolute: 0.2 10*3/uL (ref 0.0–0.5)
Eosinophils Relative: 0.2 %
Eosinophils Relative: 3 %
HCT: 31.1 % — ABNORMAL LOW (ref 35.0–45.0)
HCT: 34.6 % — ABNORMAL LOW (ref 36.0–46.0)
Hemoglobin: 10.5 g/dL — ABNORMAL LOW (ref 11.7–15.5)
Hemoglobin: 11 g/dL — ABNORMAL LOW (ref 12.0–15.0)
Immature Granulocytes: 1 %
Lymphocytes Relative: 39 %
Lymphs Abs: 2163 cells/uL (ref 850–3900)
Lymphs Abs: 2.9 10*3/uL (ref 0.7–4.0)
MCH: 26.3 pg — ABNORMAL LOW (ref 27.0–33.0)
MCH: 25.8 pg — ABNORMAL LOW (ref 26.0–34.0)
MCHC: 33.8 g/dL (ref 32.0–36.0)
MCHC: 31.8 g/dL (ref 30.0–36.0)
MCV: 77.9 fL — ABNORMAL LOW (ref 80.0–100.0)
MCV: 81.2 fL (ref 80.0–100.0)
MPV: 10.5 fL (ref 7.5–12.5)
Monocytes Absolute: 0.6 10*3/uL (ref 0.1–1.0)
Monocytes Relative: 5.8 %
Monocytes Relative: 8 %
Neutro Abs: 14065 cells/uL — ABNORMAL HIGH (ref 1500–7800)
Neutro Abs: 3.7 10*3/uL (ref 1.7–7.7)
Neutrophils Relative %: 81.3 %
Neutrophils Relative %: 48 %
Platelets: 280 10*3/uL (ref 140–400)
Platelets: 297 10*3/uL (ref 150–400)
RBC: 3.99 10*6/uL (ref 3.80–5.10)
RBC: 4.26 MIL/uL (ref 3.87–5.11)
RDW: 15.6 % — ABNORMAL HIGH (ref 11.0–15.0)
RDW: 16 % — ABNORMAL HIGH (ref 11.5–15.5)
Total Lymphocyte: 12.5 %
WBC: 17.3 10*3/uL — ABNORMAL HIGH (ref 3.8–10.8)
WBC: 7.5 10*3/uL (ref 4.0–10.5)
nRBC: 0 % (ref 0.0–0.2)

## 2018-06-01 LAB — BASIC METABOLIC PANEL
Anion gap: 11 (ref 5–15)
BUN: 34 mg/dL — ABNORMAL HIGH (ref 8–23)
CO2: 28 mmol/L (ref 22–32)
Calcium: 8.8 mg/dL — ABNORMAL LOW (ref 8.9–10.3)
Chloride: 104 mmol/L (ref 98–111)
Creatinine, Ser: 1.71 mg/dL — ABNORMAL HIGH (ref 0.44–1.00)
GFR calc Af Amer: 37 mL/min — ABNORMAL LOW (ref >60)
GFR calc non Af Amer: 32 mL/min — ABNORMAL LOW (ref >60)
Glucose, Bld: 141 mg/dL — ABNORMAL HIGH (ref 70–99)
Potassium: 3.2 mmol/L — ABNORMAL LOW (ref 3.5–5.1)
Sodium: 143 mmol/L (ref 135–145)

## 2018-06-01 LAB — COMPLETE METABOLIC PANEL WITH GFR
AG Ratio: 1 (calc) (ref 1.0–2.5)
ALT: 13 U/L (ref 6–29)
AST: 14 U/L (ref 10–35)
Albumin: 3.6 g/dL (ref 3.6–5.1)
Alkaline phosphatase (APISO): 89 U/L (ref 37–153)
BUN/Creatinine Ratio: 10 (calc) (ref 6–22)
BUN: 22 mg/dL (ref 7–25)
CO2: 27 mmol/L (ref 20–32)
Calcium: 8.5 mg/dL — ABNORMAL LOW (ref 8.6–10.4)
Chloride: 102 mmol/L (ref 98–110)
Creat: 2.14 mg/dL — ABNORMAL HIGH (ref 0.50–0.99)
GFR, Est African American: 28 mL/min/{1.73_m2} — ABNORMAL LOW (ref >=60)
GFR, Est Non African American: 24 mL/min/{1.73_m2} — ABNORMAL LOW (ref >=60)
Globulin: 3.5 g/dL (calc) (ref 1.9–3.7)
Glucose, Bld: 142 mg/dL — ABNORMAL HIGH (ref 65–99)
Potassium: 3.4 mmol/L — ABNORMAL LOW (ref 3.5–5.3)
Sodium: 141 mmol/L (ref 135–146)
Total Bilirubin: 0.4 mg/dL (ref 0.2–1.2)
Total Protein: 7.1 g/dL (ref 6.1–8.1)

## 2018-06-01 LAB — LACTIC ACID, PLASMA
LACTIC ACID: 1.8 mmol/L (ref 0.4–1.8)
Lactic Acid, Venous: 1.2 mmol/L (ref 0.5–1.9)

## 2018-06-01 NOTE — Progress Notes (Signed)
Name: Cheryl Barr   MRN: [id number]    DOB: 1955/11/08   Date:06/01/2018       Progress Note  Subjective  Chief Complaint  Chief Complaint  Patient presents with  . Follow-up    Discuss Lab Results    I connected with  [name]  on 06/01/18 at 11:20 AM EDT by a video enabled telemedicine application and verified that I am speaking with the correct person using two identifiers.  I discussed the limitations of evaluation and management by telemedicine and the availability of in person appointments. The patient expressed understanding and agreed to proceed. Staff also discussed with the patient that there may be a patient responsible charge related to this service. Patient Location: inside her car Provider Location: [facility]   HPI  24 hour follow up. She states she is feeling much better, no as weak, appetite improved and was able to eat breakfast this morning. No fever or chills. No abdominal pain or change in bowel movements She drank 8 bottles of water yesterday. Potassium this am a little lower. Discussed high potassium diet over the weekend and to return on Monday for labs. BP is still low and GFR has improved but still low, advised her to take half pill of Azor until Monday and return for follow up. White count back to normal, anemia stable    Patient Active Problem List   Diagnosis Date Noted  . Incomplete rotator cuff tear or rupture of left shoulder, not specified as traumatic 07/26/2016  . Thoracic aortic atherosclerosis ([place]) 01/12/2016  . 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 B12 deficiency 01/11/2016  . Hernia of abdominal cavity 09/15/2014  . Impaired renal function 08/10/2014  . 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 Dyslipidemia 08/10/2014  . Interval gout 08/10/2014  . Microalbuminuria 08/10/2014  . Morbid obesity ([place]) 08/10/2014  . Osteoarthritis of left knee 08/10/2014  . Vitamin D  deficiency 08/10/2014  . Cough 01/21/2014  . Intraductal papilloma of breast 06/27/2013  . Hypertension, essential, benign 09/02/2009  . ABNORMAL EKG 09/02/2009    Social History   Tobacco Use  . Smoking status: Former Smoker    Types: Cigarettes    Last attempt to quit: 10/02/1983    Years since quitting: 34.6  . Smokeless tobacco: Never Used  Substance Use Topics  . Alcohol use: No    Alcohol/week: 0.0 standard drinks     Current Outpatient Medications:  .  allopurinol (ZYLOPRIM) 100 MG tablet, Take 1 tablet (100 mg total) by mouth daily., Disp: 90 tablet, Rfl: 1 .  amLODipine-olmesartan (AZOR) 10-40 MG tablet, Take 1 tablet by mouth daily., Disp: 90 tablet, Rfl: 1 .  ammonium lactate (LAC-HYDRIN) 12 % cream, Apply topically as needed for dry skin., Disp: 385 g, Rfl: 0 .  aspirin 81 MG EC tablet, Take 81 mg by mouth daily.  , Disp: , Rfl:  .  atorvastatin (LIPITOR) 40 MG tablet, Take 1 tablet (40 mg total) by mouth daily., Disp: 90 tablet, Rfl: 1 .  budesonide-formoterol (SYMBICORT) 160-4.5 MCG/ACT inhaler, Inhale 2 puffs into the lungs 2 (two) times daily., Disp: 1 Inhaler, Rfl: 2 .  carvedilol (COREG) 12.5 MG tablet, Take 1 tablet (12.5 mg total) by mouth 2 (two) times daily with a meal., Disp: 180 tablet, Rfl: 1 .  cephALEXin (KEFLEX) 250 MG capsule, Take 1 capsule (250 mg total) by mouth 2 (two) times daily for 5 days., Disp: 10 capsule, Rfl:  0 .  Cyanocobalamin 500 MCG SUBL, Place 1 tablet under the tongue daily., Disp: , Rfl:  .  fluticasone (FLONASE) 50 MCG/ACT nasal spray, Place 2 sprays into both nostrils daily., Disp: 16 g, Rfl: 6 .  glucose blood (FREESTYLE LITE) test strip, FREESTYLE LITE TEST (In Vitro Strip)  check fsbs once a day for 0 days  Quantity: 100.00;  Refills: 5   Ordered :31-Dec-2009  Steele Sizer MD;  Dani Gobble Active Comments: DX: 250.00, Disp: , Rfl:  .  LANCETS ULTRA THIN 30G MISC, , Disp: , Rfl:  .  metFORMIN (GLUCOPHAGE-XR) 750 MG 24 hr  tablet, Take 1 tablet (750 mg total) by mouth daily with breakfast., Disp: 90 tablet, Rfl: 1 .  metroNIDAZOLE (FLAGYL) 500 MG tablet, Take 2 tablets (1,000 mg total) by mouth 2 (two) times daily., Disp: 4 tablet, Rfl: 0 .  ondansetron (ZOFRAN ODT) 4 MG disintegrating tablet, Take 1 tablet (4 mg total) by mouth every 6 (six) hours as needed for nausea or vomiting., Disp: 20 tablet, Rfl: 0 .  pregabalin (LYRICA) 100 MG capsule, Take 1 capsule (100 mg total) by mouth 2 (two) times daily., Disp: 180 capsule, Rfl: 1  No Known Allergies  I personally reviewed active problem list, medication list with the patient/caregiver today.  ROS  Ten systems reviewed and is negative except as mentioned in HPI   Objective  Virtual encounter, vitals not obtained.  There is no height or weight on file to calculate BMI.  Nursing Note and Vital Signs reviewed.  Physical Exam  Awake, alert in no distress, sitting inside her car - parked   Results for orders placed or performed during the hospital encounter of 06/01/18 (from the past 72 hour(s))  CBC with Differential/Platelet     Status: Abnormal   Collection Time: 06/01/18  7:28 AM  Result Value Ref Range   WBC 7.5 4.0 - 10.5 K/uL   RBC 4.26 3.87 - 5.11 MIL/uL   Hemoglobin 11.0 (L) 12.0 - 15.0 g/dL   HCT 34.6 (L) 36.0 - 46.0 %   MCV 81.2 80.0 - 100.0 fL   MCH 25.8 (L) 26.0 - 34.0 pg   MCHC 31.8 30.0 - 36.0 g/dL   RDW 16.0 (H) 11.5 - 15.5 %   Platelets 297 150 - 400 K/uL   nRBC 0.0 0.0 - 0.2 %   Neutrophils Relative % 48 %   Neutro Abs 3.7 1.7 - 7.7 K/uL   Lymphocytes Relative 39 %   Lymphs Abs 2.9 0.7 - 4.0 K/uL   Monocytes Relative 8 %   Monocytes Absolute 0.6 0.1 - 1.0 K/uL   Eosinophils Relative 3 %   Eosinophils Absolute 0.2 0.0 - 0.5 K/uL   Basophils Relative 1 %   Basophils Absolute 0.0 0.0 - 0.1 K/uL   Immature Granulocytes 1 %   Abs Immature Granulocytes 0.05 0.00 - 0.07 K/uL    Comment: Performed at [facility], [address] [place], [place] [address]  Lactic acid, plasma     Status: None   Collection Time: 06/01/18  7:28 AM  Result Value Ref Range   Lactic Acid, Venous 1.2 0.5 - 1.9 mmol/L    Comment: Performed at [facility], [address] [place], [place] [address]  Basic Metabolic Panel (BMET)     Status: Abnormal   Collection Time: 06/01/18  7:28 AM  Result Value Ref Range   Sodium 143 135 - 145 mmol/L   Potassium 3.2 (L) 3.5 -  5.1 mmol/L   Chloride 104 98 - 111 mmol/L   CO2 28 22 - 32 mmol/L   Glucose, Bld 141 (H) 70 - 99 mg/dL   BUN 34 (H) 8 - 23 mg/dL   Creatinine, Ser 1.71 (H) 0.44 - 1.00 mg/dL   Calcium 8.8 (L) 8.9 - 10.3 mg/dL   GFR calc non Af Amer 32 (L) >60 mL/min   GFR calc Af Amer 37 (L) >60 mL/min   Anion gap 11 5 - 15    Comment: Performed at [facility], [address], [place],  [address]    Assessment & Plan  1. Acute renal failure superimposed on chronic kidney disease, unspecified CKD stage, unspecified acute renal failure type ([place])  - BASIC METABOLIC PANEL WITH GFR  2. Acute hypokalemia  - BASIC METABOLIC PANEL WITH GFR   3. Low blood pressure reading   -Red flags and when to present for emergency care or RTC including fever >101.6F, chest pain, shortness of breath, new/worsening/un-resolving symptoms, reviewed with patient at time of visit. Follow up and care instructions discussed and provided in AVS. - I discussed the assessment and treatment plan with the patient. The patient was provided an opportunity to ask questions and all were answered. The patient agreed with the plan and demonstrated an understanding of the instructions.  I provided 15 minutes of non-face-to-face time during this encounter.  Loistine Chance, MD

## 2018-06-03 ENCOUNTER — Other Ambulatory Visit: Payer: Self-pay | Admitting: Family Medicine

## 2018-06-03 DIAGNOSIS — N179 Acute kidney failure, unspecified: Secondary | ICD-10-CM

## 2018-06-03 DIAGNOSIS — N189 Chronic kidney disease, unspecified: Principal | ICD-10-CM

## 2018-06-03 DIAGNOSIS — E876 Hypokalemia: Secondary | ICD-10-CM

## 2018-06-03 LAB — CULTURE, BLOOD (ROUTINE X 2)
Culture: NO GROWTH
Culture: NO GROWTH

## 2018-06-04 ENCOUNTER — Other Ambulatory Visit: Payer: Self-pay

## 2018-06-04 ENCOUNTER — Encounter: Payer: Self-pay | Admitting: Family Medicine

## 2018-06-04 ENCOUNTER — Ambulatory Visit (INDEPENDENT_AMBULATORY_CARE_PROVIDER_SITE_OTHER): Payer: Commercial Managed Care - PPO

## 2018-06-04 VITALS — BP 150/90 | HR 76

## 2018-06-04 DIAGNOSIS — Z0130 Encounter for examination of blood pressure without abnormal findings: Secondary | ICD-10-CM

## 2018-06-04 LAB — BASIC METABOLIC PANEL WITH GFR
BUN/Creatinine Ratio: 21 (calc) (ref 6–22)
BUN: 32 mg/dL — ABNORMAL HIGH (ref 7–25)
CO2: 30 mmol/L (ref 20–32)
Calcium: 9.2 mg/dL (ref 8.6–10.4)
Chloride: 105 mmol/L (ref 98–110)
Creat: 1.5 mg/dL — ABNORMAL HIGH (ref 0.50–0.99)
GFR, Est African American: 43 mL/min/{1.73_m2} — ABNORMAL LOW (ref >=60)
GFR, Est Non African American: 37 mL/min/{1.73_m2} — ABNORMAL LOW (ref >=60)
Glucose, Bld: 121 mg/dL — ABNORMAL HIGH (ref 65–99)
Potassium: 3.6 mmol/L (ref 3.5–5.3)
Sodium: 146 mmol/L (ref 135–146)

## 2018-06-04 NOTE — Progress Notes (Signed)
Patient is here for a blood pressure check. Patient denies chest pain, palpitations, shortness of breath or visual disturbances. At previous visit blood pressure was 100/58 with a heart rate of 73. Today during nurse visit first check blood pressure was 150/90 with a heart rate of 76. She does take any blood pressure medications.   PCP advised her to continue taking her blood pressure medications as prescribed.

## 2018-08-14 ENCOUNTER — Ambulatory Visit (INDEPENDENT_AMBULATORY_CARE_PROVIDER_SITE_OTHER): Payer: Commercial Managed Care - PPO | Admitting: Family Medicine

## 2018-08-14 ENCOUNTER — Other Ambulatory Visit: Payer: Self-pay

## 2018-08-14 ENCOUNTER — Encounter: Payer: Self-pay | Admitting: Family Medicine

## 2018-08-14 VITALS — BP 138/82 | HR 88 | Temp 96.8°F | Resp 16 | Ht 67.0 in | Wt 215.4 lb

## 2018-08-14 DIAGNOSIS — I10 Essential (primary) hypertension: Secondary | ICD-10-CM

## 2018-08-14 DIAGNOSIS — M109 Gout, unspecified: Secondary | ICD-10-CM | POA: Diagnosis not present

## 2018-08-14 DIAGNOSIS — I700 Atherosclerosis of aorta: Secondary | ICD-10-CM

## 2018-08-14 DIAGNOSIS — E785 Hyperlipidemia, unspecified: Secondary | ICD-10-CM

## 2018-08-14 DIAGNOSIS — J4521 Mild intermittent asthma with (acute) exacerbation: Secondary | ICD-10-CM

## 2018-08-14 DIAGNOSIS — R351 Nocturia: Secondary | ICD-10-CM

## 2018-08-14 DIAGNOSIS — E1129 Type 2 diabetes mellitus with other diabetic kidney complication: Secondary | ICD-10-CM | POA: Diagnosis not present

## 2018-08-14 DIAGNOSIS — R809 Proteinuria, unspecified: Secondary | ICD-10-CM | POA: Diagnosis not present

## 2018-08-14 DIAGNOSIS — G5711 Meralgia paresthetica, right lower limb: Secondary | ICD-10-CM

## 2018-08-14 LAB — POCT GLYCOSYLATED HEMOGLOBIN (HGB A1C): Hemoglobin A1C: 6.4 % — AB (ref 4.0–5.6)

## 2018-08-14 MED ORDER — PREGABALIN 100 MG PO CAPS: 100.0000 mg | ORAL_CAPSULE | Freq: Two times a day (BID) | ORAL | 1 refills | Status: DC

## 2018-08-14 MED ORDER — AMLODIPINE-OLMESARTAN 10-40 MG PO TABS: 1.0000 | ORAL_TABLET | Freq: Every day | ORAL | 1 refills | Status: DC

## 2018-08-14 MED ORDER — TOLTERODINE TARTRATE ER 2 MG PO CP24: 2.0000 mg | ORAL_CAPSULE | Freq: Every day | ORAL | 0 refills | Status: DC

## 2018-08-14 MED ORDER — METFORMIN HCL ER 750 MG PO TB24: 750.0000 mg | ORAL_TABLET | Freq: Every day | ORAL | 1 refills | Status: DC

## 2018-08-14 MED ORDER — ATORVASTATIN CALCIUM 40 MG PO TABS: 40.0000 mg | ORAL_TABLET | Freq: Every day | ORAL | 1 refills | Status: DC

## 2018-08-14 MED ORDER — ALLOPURINOL 100 MG PO TABS: 100.0000 mg | ORAL_TABLET | Freq: Every day | ORAL | 1 refills | Status: DC

## 2018-08-14 NOTE — Progress Notes (Signed)
Name: Cheryl Barr   MRN: [id number]    DOB: 09-02-1955   Date:08/14/2018       Progress Note  Subjective  Chief Complaint  Chief Complaint  Patient presents with  . Diabetes  . Hypertension  . Hyperlipidemia    HPI   HTN: compliant with medication, Azor,and carvedilol,bp is at goal today.Shedenies side effects, no chest pain, palpitation, or SOB. BP at home is usually in the 140's/80's at night. Advised her to recheck after rest for 5 minutes to see if the value improves   Hyperlipidemia: last LDL was at goal down to 60 back in 10/2017  continue high dose statin therapy No myalgia, HDL at goal, eating fruit , vegetables and fish on a regular basis   DMII with microalbuminuria: urine micro was 100 in Dec 2016 and Nov 2017 and 01/2019Taking ARB, and aspirin, also Metfomin, she has symptoms of meralgia paresthetica on right side for over one year, she is on Lyrica, she stopped Lyrica since last visit since it did not improve symptoms. She denies polyphagia or polydipsia , she has nocturia.   Gout: . She is back on Allopurinol now . Doing well at this time. No recent episodes. Last uric acid 6.2.Off HCTZ. Unchanged   Obesity:gained a few pounds since last visit, she states not following a diabetic diet, she eats junk food, she is not exercising either, only walks at work   Nocturia: she does not void frequently during the day, drinks fluids all day without problems, however wakes up multiple times to void during the night, going on for years. She snores , she feels tired when she wakes up in the morning, but denies morning headaches. Discussed sleep study to rule out OSA . Her ESS was 1 and she will not qualify for the test, we will try avoiding fluids at night and add detrol to see if improves symptoms.   Cough: seen by Dr. 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 We gave her Symbicort last visit but she did not fill rx  secondary to cost. We will give her a voucher today, she continues to have a cough intermittently, and worse at night.    Patient Active Problem List   Diagnosis Date Noted  . Incomplete rotator cuff tear or rupture of left shoulder, not specified as traumatic 07/26/2016  . Thoracic aortic atherosclerosis ([place]) 01/12/2016  . 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 B12 deficiency 01/11/2016  . Hernia of abdominal cavity 09/15/2014  . Impaired renal function 08/10/2014  . Controlled type 2 diabetes with renal manifestation ([place]) 08/10/2014  . Dyslipidemia 08/10/2014  . Interval gout 08/10/2014  . Microalbuminuria 08/10/2014  . Osteoarthritis of left knee 08/10/2014  . Vitamin D deficiency 08/10/2014  . Cough 01/21/2014  . Intraductal papilloma of breast 06/27/2013  . Hypertension, essential, benign 09/02/2009  . ABNORMAL EKG 09/02/2009    Past Surgical History:  Procedure Laterality Date  . BREAST BIOPSY Left 05/24/2013   neg  . BREAST EXCISIONAL BIOPSY Left 06/21/2013   neg/ dr. Bary Castilla  . BREAST SURGERY Left 2015  . COLONOSCOPY  2009   1 polyp found. Dr. Roselyn Reef  . HERNIA REPAIR  68/12/5724   Ventral/umbilical hernia repair with 10 x 10 cm Atrium mesh in preperitoneal position.  . INSERTION OF MESH N/A 10/09/2014   Procedure: INSERTION OF MESH;  Surgeon: Robert Bellow, MD;  Location: [facility] ORS;  Service: General;  Laterality: N/A;  .  KNEE SURGERY Left 01/14/2009  . STRABISMUS SURGERY     Repair as a child  . TUBAL LIGATION  1990  . VENTRAL HERNIA REPAIR N/A 10/09/2014   Procedure: HERNIA REPAIR VENTRAL ADULT;  Surgeon: Robert Bellow, MD;  Location: [facility] ORS;  Service: General;  Laterality: N/A;    Family History  Problem Relation Age of Onset  . Hypertension Mother   . Diabetes Mother   . Heart attack Father   . Coronary artery disease Father   . Breast cancer Father 90  . Allergies Son        Barista  . Diabetes Sister   . Diabetes  Brother   . Gout Brother     Social History   Socioeconomic History  . Marital status: Widowed    Spouse name: Not on file  . Number of children: 3  . Years of education: 12th Grade  . Highest education level: Not on file  Occupational History    Employer: [employer]  Social Needs  . Financial resource strain: Not hard at all  . Food insecurity    Worry: Never true    Inability: Never true  . Transportation needs    Medical: No    Non-medical: No  Tobacco Use  . Smoking status: Former Smoker    Types: Cigarettes    Quit date: 10/02/1983    Years since quitting: 34.8  . Smokeless tobacco: Never Used  Substance and Sexual Activity  . Alcohol use: No    Alcohol/week: 0.0 standard drinks  . Drug use: No  . Sexual activity: Yes    Partners: Male    Birth control/protection: None  Lifestyle  . Physical activity    Days per week: 3 days    Minutes per session: 60 min  . Stress: Rather much  Relationships  . Social connections    Talks on phone: More than three times a week    Gets together: More than three times a week    Attends religious service: More than 4 times per year    Active member of club or organization: Yes    Attends meetings of clubs or organizations: More than 4 times per year    Relationship status: Widowed  . Intimate partner violence    Fear of current or ex partner: No    Emotionally abused: No    Physically abused: No    Forced sexual activity: No  Other Topics Concern  . Not on file  Social History Narrative   Widowed.    No regular exercise.     Current Outpatient Medications:  .  allopurinol (ZYLOPRIM) 100 MG tablet, Take 1 tablet (100 mg total) by mouth daily., Disp: 90 tablet, Rfl: 1 .  amLODipine-olmesartan (AZOR) 10-40 MG tablet, Take 1 tablet by mouth daily., Disp: 90 tablet, Rfl: 1 .  ammonium lactate (LAC-HYDRIN) 12 % cream, Apply topically as needed for dry skin., Disp: 385 g, Rfl: 0 .  aspirin 81 MG EC tablet, Take 81  mg by mouth daily.  , Disp: , Rfl:  .  atorvastatin (LIPITOR) 40 MG tablet, Take 1 tablet (40 mg total) by mouth daily., Disp: 90 tablet, Rfl: 1 .  budesonide-formoterol (SYMBICORT) 160-4.5 MCG/ACT inhaler, Inhale 2 puffs into the lungs 2 (two) times daily., Disp: 1 Inhaler, Rfl: 2 .  carvedilol (COREG) 12.5 MG tablet, Take 1 tablet (12.5 mg total) by mouth 2 (two) times daily with a meal., Disp: 180 tablet, Rfl:  1 .  Cyanocobalamin 500 MCG SUBL, Place 1 tablet under the tongue daily., Disp: , Rfl:  .  fluticasone (FLONASE) 50 MCG/ACT nasal spray, Place 2 sprays into both nostrils daily., Disp: 16 g, Rfl: 6 .  glucose blood (FREESTYLE LITE) test strip, FREESTYLE LITE TEST (In Vitro Strip)  check fsbs once a day for 0 days  Quantity: 100.00;  Refills: 5   Ordered :31-Dec-2009  Steele Sizer MD;  Dani Gobble Active Comments: DX: 250.00, Disp: , Rfl:  .  LANCETS ULTRA THIN 30G MISC, , Disp: , Rfl:  .  metFORMIN (GLUCOPHAGE-XR) 750 MG 24 hr tablet, Take 1 tablet (750 mg total) by mouth daily with breakfast., Disp: 90 tablet, Rfl: 1  No Known Allergies  I personally reviewed active problem list, medication list, allergies, family history, social history with the patient/caregiver today.   ROS  Constitutional: Negative for fever or weight change.  Respiratory: Negative for cough and shortness of breath.   Cardiovascular: Negative for chest pain or palpitations.  Gastrointestinal: Negative for abdominal pain, no bowel changes.  Musculoskeletal: Negative for gait problem or joint swelling.  Skin: Negative for rash.  Neurological: Negative for dizziness or headache.  No other specific complaints in a complete review of systems (except as listed in HPI above).  Objective  Vitals:   08/14/18 0800  BP: 138/82  Pulse: 88  Resp: 16  Temp: (!) 96.8 F (36 C)  TempSrc: Oral  SpO2: 97%  Weight: 215 lb 6.4 oz (97.7 kg)  Height: 5\' 7"  (1.702 m)    Body mass index is 33.74  kg/m.  Physical Exam  Constitutional: Patient appears well-developed and well-nourished. Obese  No distress.  HEENT: head atraumatic, normocephalic, pupils equal and reactive to light,  neck supple, oral mucosa not done  Cardiovascular: Normal rate, regular rhythm and normal heart sounds.  No murmur heard. No BLE edema. Pulmonary/Chest: Effort normal and breath sounds normal. No respiratory distress. Abdominal: Soft.  There is no tenderness. Psychiatric: Patient has a normal mood and affect. behavior is normal. Judgment and thought content normal.  Recent Results (from the past 2160 hour(s))  Urine culture     Status: Abnormal   Collection Time: 05/28/18 12:05 AM   Specimen: Urine, Random  Result Value Ref Range   Specimen Description      URINE, RANDOM Performed at [facility] [address], [place], [place] [address]    Special Requests      Normal Performed at [facility], [address], [place], [place] [address]    Culture (A)     <10,000 COLONIES/mL INSIGNIFICANT GROWTH Performed at [facility] [address] [place], [place] [address]    Report Status 05/30/2018 FINAL   Lactic acid, plasma     Status: Abnormal   Collection Time: 05/29/18 12:05 AM  Result Value Ref Range   Lactic Acid, Venous 2.1 (HH) 0.5 - 1.9 mmol/L    Comment: CRITICAL RESULT CALLED TO, READ BACK BY AND VERIFIED WITH JESSICA AGUAS AT 0046 05/29/2018 SMA Performed at [facility], [address] [place], [place] [address]   Comprehensive metabolic panel     Status: Abnormal   Collection Time: 05/29/18 12:05 AM  Result Value Ref Range   Sodium 142 135 - 145 mmol/L   Potassium 3.4 (L) 3.5 - 5.1 mmol/L   Chloride 104 98 - 111 mmol/L   CO2 26 22 - 32 mmol/L   Glucose, Bld 163 (H) 70 -  99 mg/dL   BUN 27 (H) 8 - 23 mg/dL   Creatinine, Ser 1.52 (H) 0.44 - 1.00 mg/dL   Calcium 9.2 8.9 - 10.3 mg/dL   Total Protein 8.3 (H) 6.5 - 8.1 g/dL   Albumin 3.8 3.5 -  5.0 g/dL   AST 17 15 - 41 U/L   ALT 18 0 - 44 U/L   Alkaline Phosphatase 94 38 - 126 U/L   Total Bilirubin 0.6 0.3 - 1.2 mg/dL   GFR calc non Af Amer 36 (L) >60 mL/min   GFR calc Af Amer 42 (L) >60 mL/min   Anion gap 12 5 - 15    Comment: Performed at [facility], [address] [place], [place] [address]  Lipase, blood     Status: None   Collection Time: 05/29/18 12:05 AM  Result Value Ref Range   Lipase 17 11 - 51 U/L    Comment: Performed at [facility], [address] [place], [place] [address]  CBC WITH DIFFERENTIAL     Status: Abnormal   Collection Time: 05/29/18 12:05 AM  Result Value Ref Range   WBC 13.6 (H) 4.0 - 10.5 K/uL   RBC 4.20 3.87 - 5.11 MIL/uL   Hemoglobin 11.0 (L) 12.0 - 15.0 g/dL   HCT 34.5 (L) 36.0 - 46.0 %   MCV 82.1 80.0 - 100.0 fL   MCH 26.2 26.0 - 34.0 pg   MCHC 31.9 30.0 - 36.0 g/dL   RDW 15.9 (H) 11.5 - 15.5 %   Platelets 309 150 - 400 K/uL   nRBC 0.0 0.0 - 0.2 %   Neutrophils Relative % 84 %   Neutro Abs 11.5 (H) 1.7 - 7.7 K/uL   Lymphocytes Relative 11 %   Lymphs Abs 1.5 0.7 - 4.0 K/uL   Monocytes Relative 4 %   Monocytes Absolute 0.5 0.1 - 1.0 K/uL   Eosinophils Relative 0 %   Eosinophils Absolute 0.0 0.0 - 0.5 K/uL   Basophils Relative 0 %   Basophils Absolute 0.0 0.0 - 0.1 K/uL   Immature Granulocytes 1 %   Abs Immature Granulocytes 0.07 0.00 - 0.07 K/uL    Comment: Performed at [facility], [address] [place], [place] [address]  Protime-INR     Status: None   Collection Time: 05/29/18 12:05 AM  Result Value Ref Range   Prothrombin Time 13.1 11.4 - 15.2 seconds   INR 1.0 0.8 - 1.2    Comment: (NOTE) INR goal varies based on device and disease states. Performed at [facility], [address] [place], [place] [address]   Urinalysis, Complete w Microscopic     Status: Abnormal   Collection Time: 05/29/18 12:05 AM  Result Value Ref Range   Color, Urine YELLOW (A) YELLOW   APPearance  CLOUDY (A) CLEAR   Specific Gravity, Urine 1.013 1.005 - 1.030   pH 6.0 5.0 - 8.0   Glucose, UA NEGATIVE NEGATIVE mg/dL   Hgb urine dipstick SMALL (A) NEGATIVE   Bilirubin Urine NEGATIVE NEGATIVE   Ketones, ur NEGATIVE NEGATIVE mg/dL   Protein, ur 100 (A) NEGATIVE mg/dL   Nitrite NEGATIVE NEGATIVE   Leukocytes,Ua NEGATIVE NEGATIVE   RBC / HPF 0-5 0 - 5 RBC/hpf   WBC, UA 6-10 0 - 5 WBC/hpf   Bacteria, UA MANY (A) NONE SEEN   Squamous Epithelial / LPF 0-5 0 - 5   Mucus PRESENT     Comment: Performed at [facility],  [place], [place] [address]  Blood Culture (routine x 2)     Status: None   Collection Time: 05/29/18 12:07 AM   Specimen: BLOOD  Result Value Ref Range   Specimen Description BLOOD LEFT ANTECUBITAL    Special Requests      BOTTLES DRAWN AEROBIC AND ANAEROBIC Blood Culture results may not be optimal due to an excessive volume of blood received in culture bottles   Culture      NO GROWTH 5 DAYS Performed at [facility], [address] [place], [place] [address]    Report Status 06/03/2018 FINAL   Blood Culture (routine x 2)     Status: None   Collection Time: 05/29/18 12:38 AM   Specimen: BLOOD  Result Value Ref Range   Specimen Description BLOOD BLOOD RIGHT HAND    Special Requests      BOTTLES DRAWN AEROBIC AND ANAEROBIC Blood Culture results may not be optimal due to an inadequate volume of blood received in culture bottles   Culture      NO GROWTH 5 DAYS Performed at [facility], [address] [place], [place] [address]    Report Status 06/03/2018 FINAL   COMPLETE METABOLIC PANEL WITH GFR     Status: Abnormal   Collection Time: 05/30/18  2:55 PM  Result Value Ref Range   Glucose, Bld 142 (H) 65 - 99 mg/dL    Comment: .            Fasting reference interval . For someone without known diabetes, a glucose value >125 mg/dL indicates that they may have diabetes and this should be confirmed with a follow-up  test. .    BUN 22 7 - 25 mg/dL   Creat 2.14 (H) 0.50 - 0.99 mg/dL    Comment: For patients >52 years of age, the reference limit for Creatinine is approximately 13% higher for people identified as African-American. .    GFR, Est Non African American 24 (L) > OR = 60 mL/min/1.27m2   GFR, Est African American 28 (L) > OR = 60 mL/min/1.47m2   BUN/Creatinine Ratio 10 6 - 22 (calc)   Sodium 141 135 - 146 mmol/L   Potassium 3.4 (L) 3.5 - 5.3 mmol/L   Chloride 102 98 - 110 mmol/L   CO2 27 20 - 32 mmol/L   Calcium 8.5 (L) 8.6 - 10.4 mg/dL   Total Protein 7.1 6.1 - 8.1 g/dL   Albumin 3.6 3.6 - 5.1 g/dL   Globulin 3.5 1.9 - 3.7 g/dL (calc)   AG Ratio 1.0 1.0 - 2.5 (calc)   Total Bilirubin 0.4 0.2 - 1.2 mg/dL   Alkaline phosphatase (APISO) 89 37 - 153 U/L   AST 14 10 - 35 U/L   ALT 13 6 - 29 U/L  CBC with Differential/Platelet     Status: Abnormal   Collection Time: 05/30/18  2:55 PM  Result Value Ref Range   WBC 17.3 (H) 3.8 - 10.8 Thousand/uL   RBC 3.99 3.80 - 5.10 Million/uL   Hemoglobin 10.5 (L) 11.7 - 15.5 g/dL   HCT 31.1 (L) 35.0 - 45.0 %   MCV 77.9 (L) 80.0 - 100.0 fL   MCH 26.3 (L) 27.0 - 33.0 pg   MCHC 33.8 32.0 - 36.0 g/dL   RDW 15.6 (H) 11.0 - 15.0 %   Platelets 280 140 - 400 Thousand/uL   MPV 10.5 7.5 - 12.5 fL   Neutro Abs 14,065 (H) 1,500 - 7,800  cells/uL   Lymphs Abs 2,163 850 - 3,900 cells/uL   Absolute Monocytes 1,003 (H) 200 - 950 cells/uL   Eosinophils Absolute 35 15 - 500 cells/uL   Basophils Absolute 35 0 - 200 cells/uL   Neutrophils Relative % 81.3 %   Total Lymphocyte 12.5 %   Monocytes Relative 5.8 %   Eosinophils Relative 0.2 %   Basophils Relative 0.2 %  Lactic acid, plasma     Status: None   Collection Time: 05/30/18  2:55 PM  Result Value Ref Range   LACTIC ACID 1.8 0.4 - 1.8 mmol/L  CBC with Differential/Platelet     Status: Abnormal   Collection Time: 06/01/18  7:28 AM  Result Value Ref Range   WBC 7.5 4.0 - 10.5 K/uL   RBC 4.26 3.87 - 5.11  MIL/uL   Hemoglobin 11.0 (L) 12.0 - 15.0 g/dL   HCT 34.6 (L) 36.0 - 46.0 %   MCV 81.2 80.0 - 100.0 fL   MCH 25.8 (L) 26.0 - 34.0 pg   MCHC 31.8 30.0 - 36.0 g/dL   RDW 16.0 (H) 11.5 - 15.5 %   Platelets 297 150 - 400 K/uL   nRBC 0.0 0.0 - 0.2 %   Neutrophils Relative % 48 %   Neutro Abs 3.7 1.7 - 7.7 K/uL   Lymphocytes Relative 39 %   Lymphs Abs 2.9 0.7 - 4.0 K/uL   Monocytes Relative 8 %   Monocytes Absolute 0.6 0.1 - 1.0 K/uL   Eosinophils Relative 3 %   Eosinophils Absolute 0.2 0.0 - 0.5 K/uL   Basophils Relative 1 %   Basophils Absolute 0.0 0.0 - 0.1 K/uL   Immature Granulocytes 1 %   Abs Immature Granulocytes 0.05 0.00 - 0.07 K/uL    Comment: Performed at [facility], [address] [place], [place] [address]  Lactic acid, plasma     Status: None   Collection Time: 06/01/18  7:28 AM  Result Value Ref Range   Lactic Acid, Venous 1.2 0.5 - 1.9 mmol/L    Comment: Performed at [facility], [address] [place], [place] [address]  Basic Metabolic Panel (BMET)     Status: Abnormal   Collection Time: 06/01/18  7:28 AM  Result Value Ref Range   Sodium 143 135 - 145 mmol/L   Potassium 3.2 (L) 3.5 - 5.1 mmol/L   Chloride 104 98 - 111 mmol/L   CO2 28 22 - 32 mmol/L   Glucose, Bld 141 (H) 70 - 99 mg/dL   BUN 34 (H) 8 - 23 mg/dL   Creatinine, Ser 1.71 (H) 0.44 - 1.00 mg/dL   Calcium 8.8 (L) 8.9 - 10.3 mg/dL   GFR calc non Af Amer 32 (L) >60 mL/min   GFR calc Af Amer 37 (L) >60 mL/min   Anion gap 11 5 - 15    Comment: Performed at [facility], [address] [place], [place] [address]  BASIC METABOLIC PANEL WITH GFR     Status: Abnormal   Collection Time: 06/04/18  9:07 AM  Result Value Ref Range   Glucose, Bld 121 (H) 65 - 99 mg/dL    Comment: .            Fasting reference interval . For someone without known diabetes, a glucose value between 100 and 125 mg/dL is consistent with prediabetes and should be confirmed with a follow-up  test. .    BUN 32 (H) 7 - 25 mg/dL  Creat 1.50 (H) 0.50 - 0.99 mg/dL    Comment: For patients >48 years of age, the reference limit for Creatinine is approximately 13% higher for people identified as African-American. .    GFR, Est Non African American 37 (L) > OR = 60 mL/min/1.33m2   GFR, Est African American 43 (L) > OR = 60 mL/min/1.61m2   BUN/Creatinine Ratio 21 6 - 22 (calc)   Sodium 146 135 - 146 mmol/L   Potassium 3.6 3.5 - 5.3 mmol/L   Chloride 105 98 - 110 mmol/L   CO2 30 20 - 32 mmol/L   Calcium 9.2 8.6 - 10.4 mg/dL      PHQ2/9: Depression screen [facility] 2/9 08/14/2018 05/30/2018 04/03/2018 01/04/2018 12/01/2017  Decreased Interest 0 0 0 0 0  Down, Depressed, Hopeless 0 0 0 2 0  PHQ - 2 Score 0 0 0 2 0  Altered sleeping 0 1 - 2 0  Tired, decreased energy 0 1 - 0 0  Change in appetite 0 0 - 0 0  Feeling bad or failure about yourself  0 0 - 0 0  Trouble concentrating 0 0 - 0 0  Moving slowly or fidgety/restless 0 0 - 0 0  Suicidal thoughts 0 0 - 0 0  PHQ-9 Score 0 2 - 4 0  Difficult doing work/chores - Not difficult at all - Not difficult at all Not difficult at all  Some recent data might be hidden    phq 9 is negative  Fall Risk: Fall Risk  08/14/2018 05/30/2018 04/03/2018 01/04/2018 12/01/2017  Falls in the past year? 0 0 0 0 No  Number falls in past yr: 0 0 0 - -  Injury with Fall? 0 0 0 - -     Functional Status Survey: Is the patient deaf or have difficulty hearing?: No Does the patient have difficulty seeing, even when wearing glasses/contacts?: No Does the patient have difficulty concentrating, remembering, or making decisions?: No Does the patient have difficulty walking or climbing stairs?: No Does the patient have difficulty dressing or bathing?: No Does the patient have difficulty doing errands alone such as visiting a doctor's office or shopping?: No    Assessment & Plan  1. Controlled type 2 diabetes mellitus with microalbuminuria, without  long-term current use of insulin (HCC)  - POCT HgB A1C - metFORMIN (GLUCOPHAGE-XR) 750 MG 24 hr tablet; Take 1 tablet (750 mg total) by mouth daily with breakfast.  Dispense: 90 tablet; Refill: 1  2. Mild intermittent reactive airway disease with acute exacerbation  Continue Symbicort   3. Controlled gout  - allopurinol (ZYLOPRIM) 100 MG tablet; Take 1 tablet (100 mg total) by mouth daily.  Dispense: 90 tablet; Refill: 1  4. Hypertension, essential, benign  - amLODipine-olmesartan (AZOR) 10-40 MG tablet; Take 1 tablet by mouth daily.  Dispense: 90 tablet; Refill: 1  5. Dyslipidemia  - atorvastatin (LIPITOR) 40 MG tablet; Take 1 tablet (40 mg total) by mouth daily.  Dispense: 90 tablet; Refill: 1  6. Meralgia paresthetica of right side  Stopped Lyrica   7. Thoracic aortic atherosclerosis ([place])  On statin therapy   8. Nocturia more than twice per night  - tolterodine (DETROL LA) 2 MG 24 hr capsule; Take 1 capsule (2 mg total) by mouth daily.  Dispense: 30 capsule; Refill: 0

## 2018-08-14 NOTE — Patient Instructions (Signed)
Discussed avoiding caffeine after 5 pm, cut down on drinks in general after 5 pm, and void prior to going to bed

## 2018-11-15 ENCOUNTER — Other Ambulatory Visit: Payer: Self-pay

## 2018-11-15 ENCOUNTER — Other Ambulatory Visit (HOSPITAL_COMMUNITY)
Admission: RE | Admit: 2018-11-15 | Discharge: 2018-11-15 | Disposition: A | Discharge status: Home / Self Care | Payer: Commercial Managed Care - PPO | Source: Ambulatory Visit | Attending: Family Medicine | Admitting: Family Medicine

## 2018-11-15 ENCOUNTER — Encounter: Payer: Self-pay | Admitting: Family Medicine

## 2018-11-15 ENCOUNTER — Ambulatory Visit (INDEPENDENT_AMBULATORY_CARE_PROVIDER_SITE_OTHER): Payer: Commercial Managed Care - PPO | Admitting: Family Medicine

## 2018-11-15 VITALS — BP 120/72 | HR 65 | Temp 96.9°F | Resp 14 | Ht 67.0 in | Wt 214.3 lb

## 2018-11-15 DIAGNOSIS — R809 Proteinuria, unspecified: Secondary | ICD-10-CM

## 2018-11-15 DIAGNOSIS — Z113 Encounter for screening for infections with a predominantly sexual mode of transmission: Secondary | ICD-10-CM

## 2018-11-15 DIAGNOSIS — Z0000 Encounter for general adult medical examination without abnormal findings: Secondary | ICD-10-CM | POA: Diagnosis not present

## 2018-11-15 DIAGNOSIS — I700 Atherosclerosis of aorta: Secondary | ICD-10-CM

## 2018-11-15 DIAGNOSIS — Z23 Encounter for immunization: Secondary | ICD-10-CM | POA: Diagnosis not present

## 2018-11-15 DIAGNOSIS — Z1231 Encounter for screening mammogram for malignant neoplasm of breast: Secondary | ICD-10-CM

## 2018-11-15 DIAGNOSIS — E559 Vitamin D deficiency, unspecified: Secondary | ICD-10-CM

## 2018-11-15 DIAGNOSIS — I10 Essential (primary) hypertension: Secondary | ICD-10-CM

## 2018-11-15 DIAGNOSIS — E1129 Type 2 diabetes mellitus with other diabetic kidney complication: Secondary | ICD-10-CM | POA: Diagnosis not present

## 2018-11-15 DIAGNOSIS — E538 Deficiency of other specified B group vitamins: Secondary | ICD-10-CM

## 2018-11-15 DIAGNOSIS — E785 Hyperlipidemia, unspecified: Secondary | ICD-10-CM

## 2018-11-15 LAB — POCT GLYCOSYLATED HEMOGLOBIN (HGB A1C): HbA1c, POC (controlled diabetic range): 6.6 % (ref 0.0–7.0)

## 2018-11-15 NOTE — Progress Notes (Signed)
Name: Cheryl Barr   MRN: [id number]    DOB: 09/07/1955   Date:11/15/2018       Progress Note  Subjective  Chief Complaint  Chief Complaint  Patient presents with  . Medication Refill    3 month F/U  . Diabetes  . Hypertension  . Hyperlipidemia  . Gout  . Obesity  . Annual Exam    HPI  Patient presents for annual CPE and follow up  HTN: compliant with medication, Azor,and carvedilol,bp is at goal today.Shedenies side effects, no chest pain, palpitation, or SOB. BP at home has been 120's now   Hyperlipidemia: last LDL was at goaldown to 60 back in 09/2019continue high dose statin therapy No myalgia, HDL at goal, eating fruit , vegetables and fish on a regular basisWe will recheck labs today   DMII with microalbuminuria: urine micro was 100 in Dec 2016 and Nov 2017 and 01/2019Taking ARB, and last urine micro was back to normal, we will recheck it yearly. She is on  aspirin,  Metfomin, she has symptoms of meralgia paresthetica on right side for a long time, she was on Lyrica and stopped taking it, but states she will resume since burning getting worse again. . She denies polyphagia or polydipsia , she has nocturia. A1C is at goal 6.6%   Gout: . She is back on Allopurinol now . Doing well at this time. No recent episodes. Last uric acid 6.2.Off HCTZ. Unchanged   Obesity:gained a few pounds since last visit, she states not following a diabetic diet, she eats junk food, she is not exercising either, only walks at work   Nocturia: she does not void frequently during the day, drinks fluids all day without problems, however wakes up multiple times to void during the night, going on for years. She snores , she feels tired when she wakes up in the morning, but denies morning headaches. Discussed sleep study to rule out OSA . Her ESS was 1 and she will not qualify for the test, we will try avoiding fluids at night and we added Detrol back in Summer 2020, nocturia is down to 3  times per night, but used to be all night long.   Cough:seen by Dr. 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 She is now on Symbicort, she is using BID, she states cough is better, but still coughing in am's    Diet: balanced diet, avoiding sweets.  Exercise: no currently   USPSTF grade A and B recommendations    Office Visit from 11/15/2018 in [facility]  AUDIT-C Score  0     Depression: Phq 9 is  negative Depression screen [facility] 2/9 11/15/2018 08/14/2018 05/30/2018 04/03/2018 01/04/2018  Decreased Interest 0 0 0 0 0  Down, Depressed, Hopeless 0 0 0 0 2  PHQ - 2 Score 0 0 0 0 2  Altered sleeping 0 0 1 - 2  Tired, decreased energy 0 0 1 - 0  Change in appetite 0 0 0 - 0  Feeling bad or failure about yourself  0 0 0 - 0  Trouble concentrating 0 0 0 - 0  Moving slowly or fidgety/restless 0 0 0 - 0  Suicidal thoughts 0 0 0 - 0  PHQ-9 Score 0 0 2 - 4  Difficult doing work/chores Not difficult at all - Not difficult at all - Not difficult at all  Some recent data might be hidden   Hypertension:  BP Readings from Last 3 Encounters:  11/15/18 120/72  08/14/18 138/82  06/04/18 (!) 150/90   Obesity: Wt Readings from Last 3 Encounters:  11/15/18 214 lb 4.8 oz (97.2 kg)  08/14/18 215 lb 6.4 oz (97.7 kg)  05/30/18 211 lb 1.6 oz (95.8 kg)   BMI Readings from Last 3 Encounters:  11/15/18 33.56 kg/m  08/14/18 33.74 kg/m  05/30/18 33.06 kg/m     Hep C Screening: up to date STD testing and prevention (HIV/chl/gon/syphilis): we will recheck today  Intimate partner violence:negative  Sexual History/Pain during Intercourse: no pain during sex.  Menstrual History/LMP/Abnormal Bleeding: s/p menopause, discussed post-menopausal bleeding  Incontinence Symptoms: still has nocturia, but improving   Breast cancer:  - Last Mammogram: 04/2018 - BRCA gene screening: father had breast cancer, discussed genetic  testing but she wants to hold off   Osteoporosis Screening: discussed high calcium and vitamin D diet   Cervical cancer screening: repeat in 2021   Skin cancer: discussed  Colorectal cancer: up to date  Lung cancer:  Low Dose CT Chest recommended if Age 3-80 years, 30 pack-year currently smoking OR have quit w/in 15years. Patient does not qualify.   [id number]   Advanced Care Planning: A voluntary discussion about advance care planning including the explanation and discussion of advance directives.  Discussed health care proxy and Living will, and the patient was able to identify a health care proxy as daughter .  Patient does not have a living will at present time.  Lipids: Lab Results  Component Value Date   CHOL 125 10/30/2017   CHOL 140 02/22/2017   CHOL 122 04/13/2016   Lab Results  Component Value Date   HDL 45 (L) 10/30/2017   HDL 48 (L) 02/22/2017   HDL 49 (L) 04/13/2016   Lab Results  Component Value Date   LDLCALC 60 10/30/2017   LDLCALC 68 02/22/2017   LDLCALC 56 04/13/2016   Lab Results  Component Value Date   TRIG 115 10/30/2017   TRIG 153 (H) 02/22/2017   TRIG 84 04/13/2016   Lab Results  Component Value Date   CHOLHDL 2.8 10/30/2017   CHOLHDL 2.9 02/22/2017   CHOLHDL 2.5 04/13/2016   No results found for: LDLDIRECT  Glucose: Glucose  Date Value Ref Range Status  06/17/2013 137 (H) 65 - 99 mg/dL Final   Glucose, Bld  Date Value Ref Range Status  06/04/2018 121 (H) 65 - 99 mg/dL Final    Comment:    .            Fasting reference interval . For someone without known diabetes, a glucose value between 100 and 125 mg/dL is consistent with prediabetes and should be confirmed with a follow-up test. .   06/01/2018 141 (H) 70 - 99 mg/dL Final  05/30/2018 142 (H) 65 - 99 mg/dL Final    Comment:    .            Fasting reference interval . For someone without known diabetes, a glucose value >125 mg/dL indicates that they may have diabetes and  this should be confirmed with a follow-up test. .    Glucose-Capillary  Date Value Ref Range Status  10/09/2014 148 (H) 65 - 99 mg/dL Final  10/09/2014 126 (H) 65 - 99 mg/dL Final    Patient Active Problem List   Diagnosis Date Noted  . Incomplete rotator cuff tear or rupture of left shoulder, not specified as traumatic 07/26/2016  . Thoracic aortic atherosclerosis ([place])  01/12/2016  . 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 B12 deficiency 01/11/2016  . Hernia of abdominal cavity 09/15/2014  . Impaired renal function 08/10/2014  . 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 Dyslipidemia 08/10/2014  . Interval gout 08/10/2014  . Microalbuminuria 08/10/2014  . Osteoarthritis of left knee 08/10/2014  . Vitamin D deficiency 08/10/2014  . Cough 01/21/2014  . Intraductal papilloma of breast 06/27/2013  . Hypertension, essential, benign 09/02/2009  . ABNORMAL EKG 09/02/2009    Past Surgical History:  Procedure Laterality Date  . BREAST BIOPSY Left 05/24/2013   neg  . BREAST EXCISIONAL BIOPSY Left 06/21/2013   neg/ dr. Bary Castilla  . BREAST SURGERY Left 2015  . COLONOSCOPY  2009   1 polyp found. Dr. Roselyn Reef  . HERNIA REPAIR  03/00/9233   Ventral/umbilical hernia repair with 10 x 10 cm Atrium mesh in preperitoneal position.  . INSERTION OF MESH N/A 10/09/2014   Procedure: INSERTION OF MESH;  Surgeon: Robert Bellow, MD;  Location: [facility] ORS;  Service: General;  Laterality: N/A;  . KNEE SURGERY Left 01/14/2009  . STRABISMUS SURGERY     Repair as a child  . TUBAL LIGATION  1990  . VENTRAL HERNIA REPAIR N/A 10/09/2014   Procedure: HERNIA REPAIR VENTRAL ADULT;  Surgeon: Robert Bellow, MD;  Location: [facility] ORS;  Service: General;  Laterality: N/A;    Family History  Problem Relation Age of Onset  . Hypertension Mother   . Diabetes Mother   . Heart attack Father   . Coronary artery disease Father   . Breast cancer Father 47  . Allergies Son         Barista  . Diabetes Sister   . Diabetes Brother   . Gout Brother     Social History   Socioeconomic History  . Marital status: Widowed    Spouse name: Not on file  . Number of children: 3  . Years of education: 12th Grade  . Highest education level: Not on file  Occupational History    Employer: [employer]  Social Needs  . Financial resource strain: Not hard at all  . Food insecurity    Worry: Never true    Inability: Never true  . Transportation needs    Medical: No    Non-medical: No  Tobacco Use  . Smoking status: Former Smoker    Types: Cigarettes    Quit date: 10/02/1983    Years since quitting: 35.1  . Smokeless tobacco: Never Used  Substance and Sexual Activity  . Alcohol use: No    Alcohol/week: 0.0 standard drinks  . Drug use: No  . Sexual activity: Yes    Partners: Male    Birth control/protection: None  Lifestyle  . Physical activity    Days per week: 0 days    Minutes per session: 0 min  . Stress: Not at all  Relationships  . Social connections    Talks on phone: More than three times a week    Gets together: More than three times a week    Attends religious service: More than 4 times per year    Active member of club or organization: Yes    Attends meetings of clubs or organizations: More than 4 times per year    Relationship status: Widowed  . Intimate partner violence    Fear of current or ex partner: No    Emotionally abused: No    Physically  abused: No    Forced sexual activity: No  Other Topics Concern  . Not on file  Social History Narrative   Widowed.    No regular exercise.     Current Outpatient Medications:  .  allopurinol (ZYLOPRIM) 100 MG tablet, Take 1 tablet (100 mg total) by mouth daily., Disp: 90 tablet, Rfl: 1 .  amLODipine-olmesartan (AZOR) 10-40 MG tablet, Take 1 tablet by mouth daily., Disp: 90 tablet, Rfl: 1 .  ammonium lactate (LAC-HYDRIN) 12 % cream, Apply topically as needed for dry  skin., Disp: 385 g, Rfl: 0 .  aspirin 81 MG EC tablet, Take 81 mg by mouth daily.  , Disp: , Rfl:  .  atorvastatin (LIPITOR) 40 MG tablet, Take 1 tablet (40 mg total) by mouth daily., Disp: 90 tablet, Rfl: 1 .  budesonide-formoterol (SYMBICORT) 160-4.5 MCG/ACT inhaler, Inhale 2 puffs into the lungs 2 (two) times daily., Disp: 1 Inhaler, Rfl: 2 .  carvedilol (COREG) 12.5 MG tablet, Take 1 tablet (12.5 mg total) by mouth 2 (two) times daily with a meal., Disp: 180 tablet, Rfl: 1 .  fluticasone (FLONASE) 50 MCG/ACT nasal spray, Place 2 sprays into both nostrils daily., Disp: 16 g, Rfl: 6 .  glucose blood (FREESTYLE LITE) test strip, FREESTYLE LITE TEST (In Vitro Strip)  check fsbs once a day for 0 days  Quantity: 100.00;  Refills: 5   Ordered :31-Dec-2009  Steele Sizer MD;  Dani Gobble Active Comments: DX: 250.00, Disp: , Rfl:  .  LANCETS ULTRA THIN 30G MISC, , Disp: , Rfl:  .  metFORMIN (GLUCOPHAGE-XR) 750 MG 24 hr tablet, Take 1 tablet (750 mg total) by mouth daily with breakfast., Disp: 90 tablet, Rfl: 1 .  Cyanocobalamin 500 MCG SUBL, Place 1 tablet under the tongue daily., Disp: , Rfl:  .  tolterodine (DETROL LA) 2 MG 24 hr capsule, Take 1 capsule (2 mg total) by mouth daily. (Patient not taking: Reported on 11/15/2018), Disp: 30 capsule, Rfl: 0  No Known Allergies   ROS  Constitutional: Negative for fever or weight change.  Respiratory: Negative for cough and shortness of breath.   Cardiovascular: Negative for chest pain or palpitations.  Gastrointestinal: Negative for abdominal pain, no bowel changes.  Musculoskeletal: Negative for gait problem or joint swelling.  Skin: Negative for rash.  Neurological: Negative for dizziness or headache.  No other specific complaints in a complete review of systems (except as listed in HPI above).  Objective  Vitals:   11/15/18 1006  BP: 120/72  Pulse: 65  Resp: 14  Temp: (!) 96.9 F (36.1 C)  SpO2: 99%  Weight: 214 lb 4.8 oz (97.2  kg)  Height: '5\' 7"'  (1.702 m)    Body mass index is 33.56 kg/m.  Physical Exam   Constitutional: Patient appears well-developed and well-nourished. No distress.  HENT: Head: Normocephalic and atraumatic. Ears: B TMs ok, no erythema or effusion; Nose: Nose normal. Mouth/Throat: Oropharynx is clear and moist. No oropharyngeal exudate.  Eyes: Conjunctivae and EOM are normal. Pupils are equal, round, and reactive to light. No scleral icterus.  Neck: Normal range of motion. Neck supple. No JVD present. No thyromegaly present.  Cardiovascular: Normal rate, regular rhythm and normal heart sounds.  No murmur heard. No BLE edema. Pulmonary/Chest: Effort normal and breath sounds normal. No respiratory distress. Abdominal: Soft. Bowel sounds are normal, no distension. There is no tenderness. no masses Breast: no lumps or masses, no nipple discharge or rashes FEMALE GENITALIA:  Not done  RECTAL:  not done  Musculoskeletal: Normal range of motion, no joint effusions. No gross deformities Neurological: he is alert and oriented to person, place, and time. No cranial nerve deficit. Coordination, balance, strength, speech and gait are normal.  Skin: Skin is warm and dry. No rash noted. No erythema.  Psychiatric: Patient has a normal mood and affect. behavior is normal. Judgment and thought content normal.  Recent Results (from the past 2160 hour(s))  POCT HgB A1C     Status: Normal   Collection Time: 11/15/18 10:18 AM  Result Value Ref Range   Hemoglobin A1C     HbA1c POC (<> result, manual entry)     HbA1c, POC (prediabetic range)     HbA1c, POC (controlled diabetic range) 6.6 0.0 - 7.0 %    Diabetic Foot Exam: Diabetic Foot Exam - Simple   Simple Foot Form Diabetic Foot exam was performed with the following findings: Yes 11/15/2018 12:58 PM  Visual Inspection See comments: Yes Sensation Testing Intact to touch and monofilament testing bilaterally: Yes Pulse Check Posterior Tibialis and  Dorsalis pulse intact bilaterally: Yes Comments Very thick left first toe nail, bunions both feet    Fall Risk: Fall Risk  11/15/2018 08/14/2018 05/30/2018 04/03/2018 01/04/2018  Falls in the past year? 0 0 0 0 0  Number falls in past yr: 0 0 0 0 -  Injury with Fall? 0 0 0 0 -    Functional Status Survey: Is the patient deaf or have difficulty hearing?: No Does the patient have difficulty seeing, even when wearing glasses/contacts?: No Does the patient have difficulty concentrating, remembering, or making decisions?: No Does the patient have difficulty walking or climbing stairs?: No Does the patient have difficulty dressing or bathing?: No Does the patient have difficulty doing errands alone such as visiting a doctor's office or shopping?: No   Assessment & Plan  1. Controlled type 2 diabetes mellitus with microalbuminuria, without long-term current use of insulin (HCC)  - POCT HgB A1C  2. Needs flu shot  She will get it at work   3. Well adult exam  - CBC with Differential/Platelet - COMPLETE METABOLIC PANEL WITH GFR - Lipid panel - RPR - HIV Antibody (routine testing w rflx) - Vitamin B12 - VITAMIN D 25 Hydroxy (Vit-D Deficiency, Fractures)  4. Hypertension, essential, benign  - CBC with Differential/Platelet - COMPLETE METABOLIC PANEL WITH GFR  5. Dyslipidemia  - Lipid panel  6. Thoracic aortic atherosclerosis ([place])   7. B12 deficiency  - CBC with Differential/Platelet - Vitamin B12  8. Vitamin D deficiency  - VITAMIN D 25 Hydroxy (Vit-D Deficiency, Fractures)  9. Breast cancer screening by mammogram  Repeat in March 2020   10. Routine screening for STI (sexually transmitted infection)  - RPR - HIV Antibody (routine testing w rflx) - Cervicovaginal ancillary only  -USPSTF grade A and B recommendations reviewed with patient; age-appropriate recommendations, preventive care, screening tests, etc discussed and encouraged; healthy living encouraged;  see AVS for patient education given to patient -Discussed importance of 150 minutes of physical activity weekly, eat two servings of fish weekly, eat one serving of tree nuts ( cashews, pistachios, pecans, almonds.Marland Kitchen) every other day, eat 6 servings of fruit/vegetables daily and drink plenty of water and avoid sweet beverages.

## 2018-11-15 NOTE — Patient Instructions (Signed)
Preventive Care 63 Years Old, Female Preventive care refers to visits with your health care provider and lifestyle choices that can promote health and wellness. This includes:  A yearly physical exam. This may also be called an annual well check.  Regular dental visits and eye exams.  Immunizations.  Screening for certain conditions.  Healthy lifestyle choices, such as eating a healthy diet, getting regular exercise, not using drugs or products that contain nicotine and tobacco, and limiting alcohol use. What can I expect for my preventive care visit? Physical exam Your health care provider will check your:  Height and weight. This may be used to calculate body mass index (BMI), which tells if you are at a healthy weight.  Heart rate and blood pressure.  Skin for abnormal spots. Counseling Your health care provider may ask you questions about your:  Alcohol, tobacco, and drug use.  Emotional well-being.  Home and relationship well-being.  Sexual activity.  Eating habits.  Work and work environment.  Method of birth control.  Menstrual cycle.  Pregnancy history. What immunizations do I need?  Influenza (flu) vaccine  This is recommended every year. Tetanus, diphtheria, and pertussis (Tdap) vaccine  You may need a Td booster every 10 years. Varicella (chickenpox) vaccine  You may need this if you have not been vaccinated. Zoster (shingles) vaccine  You may need this after age 63. Measles, mumps, and rubella (MMR) vaccine  You may need at least one dose of MMR if you were born in 1957 or later. You may also need a second dose. Pneumococcal conjugate (PCV13) vaccine  You may need this if you have certain conditions and were not previously vaccinated. Pneumococcal polysaccharide (PPSV23) vaccine  You may need one or two doses if you smoke cigarettes or if you have certain conditions. Meningococcal conjugate (MenACWY) vaccine  You may need this if you  have certain conditions. Hepatitis A vaccine  You may need this if you have certain conditions or if you travel or work in places where you may be exposed to hepatitis A. Hepatitis B vaccine  You may need this if you have certain conditions or if you travel or work in places where you may be exposed to hepatitis B. Haemophilus influenzae type b (Hib) vaccine  You may need this if you have certain conditions. Human papillomavirus (HPV) vaccine  If recommended by your health care provider, you may need three doses over 6 months. You may receive vaccines as individual doses or as more than one vaccine together in one shot (combination vaccines). Talk with your health care provider about the risks and benefits of combination vaccines. What tests do I need? Blood tests  Lipid and cholesterol levels. These may be checked every 5 years, or more frequently if you are over 63 years old.  Hepatitis C test.  Hepatitis B test. Screening  Lung cancer screening. You may have this screening every year starting at age 63 if you have a 30-pack-year history of smoking and currently smoke or have quit within the past 15 years.  Colorectal cancer screening. All adults should have this screening starting at age 63 and continuing until age 75. Your health care provider may recommend screening at age 63 if you are at increased risk. You will have tests every 1-10 years, depending on your results and the type of screening test.  Diabetes screening. This is done by checking your blood sugar (glucose) after you have not eaten for a while (fasting). You may have this   done every 1-3 years.  Mammogram. This may be done every 1-2 years. Talk with your health care provider about when you should start having regular mammograms. This may depend on whether you have a family history of breast cancer.  BRCA-related cancer screening. This may be done if you have a family history of breast, ovarian, tubal, or peritoneal  cancers.  Pelvic exam and Pap test. This may be done every 3 years starting at age 63. Starting at age 63, this may be done every 5 years if you have a Pap test in combination with an HPV test. Other tests  Sexually transmitted disease (STD) testing.  Bone density scan. This is done to screen for osteoporosis. You may have this scan if you are at high risk for osteoporosis. Follow these instructions at home: Eating and drinking  Eat a diet that includes fresh fruits and vegetables, whole grains, lean protein, and low-fat dairy.  Take vitamin and mineral supplements as recommended by your health care provider.  Do not drink alcohol if: ? Your health care provider tells you not to drink. ? You are pregnant, may be pregnant, or are planning to become pregnant.  If you drink alcohol: ? Limit how much you have to 0-1 drink a day. ? Be aware of how much alcohol is in your drink. In the [place] one drink equals one 12 oz bottle of beer (355 mL), one 5 oz glass of wine (148 mL), or one 1 oz glass of hard liquor (44 mL). Lifestyle  Take daily care of your teeth and gums.  Stay active. Exercise for at least 30 minutes on 5 or more days each week.  Do not use any products that contain nicotine or tobacco, such as cigarettes, e-cigarettes, and chewing tobacco. If you need help quitting, ask your health care provider.  If you are sexually active, practice safe sex. Use a condom or other form of birth control (contraception) in order to prevent pregnancy and STIs (sexually transmitted infections).  If told by your health care provider, take low-dose aspirin daily starting at age 63. What's next?  Visit your health care provider once a year for a well check visit.  Ask your health care provider how often you should have your eyes and teeth checked.  Stay up to date on all vaccines. This information is not intended to replace advice given to you by your health care provider. Make sure you  discuss any questions you have with your health care provider. Document Released: 02/27/2015 Document Revised: 10/12/2017 Document Reviewed: 10/12/2017 Elsevier Patient Education  2020 [employer]  

## 2018-11-16 LAB — CERVICOVAGINAL ANCILLARY ONLY
Chlamydia: NEGATIVE
Neisseria Gonorrhea: NEGATIVE
Trichomonas: NEGATIVE

## 2018-11-18 ENCOUNTER — Other Ambulatory Visit: Payer: Self-pay | Admitting: Family Medicine

## 2018-11-18 DIAGNOSIS — D649 Anemia, unspecified: Secondary | ICD-10-CM

## 2018-11-18 NOTE — Progress Notes (Unsigned)
i

## 2018-11-20 LAB — CBC WITH DIFFERENTIAL/PLATELET
Absolute Monocytes: 261 cells/uL (ref 200–950)
Basophils Absolute: 29 cells/uL (ref 0–200)
Basophils Relative: 0.5 %
Eosinophils Absolute: 81 cells/uL (ref 15–500)
Eosinophils Relative: 1.4 %
HCT: 31.4 % — ABNORMAL LOW (ref 35.0–45.0)
Hemoglobin: 10.5 g/dL — ABNORMAL LOW (ref 11.7–15.5)
Lymphs Abs: 2337 cells/uL (ref 850–3900)
MCH: 26.6 pg — ABNORMAL LOW (ref 27.0–33.0)
MCHC: 33.4 g/dL (ref 32.0–36.0)
MCV: 79.5 fL — ABNORMAL LOW (ref 80.0–100.0)
MPV: 9.3 fL (ref 7.5–12.5)
Monocytes Relative: 4.5 %
Neutro Abs: 3091 cells/uL (ref 1500–7800)
Neutrophils Relative %: 53.3 %
Platelets: 343 10*3/uL (ref 140–400)
RBC: 3.95 10*6/uL (ref 3.80–5.10)
RDW: 14.5 % (ref 11.0–15.0)
Total Lymphocyte: 40.3 %
WBC: 5.8 10*3/uL (ref 3.8–10.8)

## 2018-11-20 LAB — TEST AUTHORIZATION

## 2018-11-20 LAB — COMPLETE METABOLIC PANEL WITH GFR
AG Ratio: 1.2 (calc) (ref 1.0–2.5)
ALT: 16 U/L (ref 6–29)
AST: 14 U/L (ref 10–35)
Albumin: 4.1 g/dL (ref 3.6–5.1)
Alkaline phosphatase (APISO): 83 U/L (ref 37–153)
BUN/Creatinine Ratio: 17 (calc) (ref 6–22)
BUN: 28 mg/dL — ABNORMAL HIGH (ref 7–25)
CO2: 31 mmol/L (ref 20–32)
Calcium: 9.4 mg/dL (ref 8.6–10.4)
Chloride: 104 mmol/L (ref 98–110)
Creat: 1.61 mg/dL — ABNORMAL HIGH (ref 0.50–0.99)
GFR, Est African American: 39 mL/min/{1.73_m2} — ABNORMAL LOW (ref >=60)
GFR, Est Non African American: 34 mL/min/{1.73_m2} — ABNORMAL LOW (ref >=60)
Globulin: 3.5 g/dL (calc) (ref 1.9–3.7)
Glucose, Bld: 115 mg/dL — ABNORMAL HIGH (ref 65–99)
Potassium: 3.8 mmol/L (ref 3.5–5.3)
Sodium: 143 mmol/L (ref 135–146)
Total Bilirubin: 0.4 mg/dL (ref 0.2–1.2)
Total Protein: 7.6 g/dL (ref 6.1–8.1)

## 2018-11-20 LAB — IRON,TIBC AND FERRITIN PANEL
%SAT: 15 % (calc) — ABNORMAL LOW (ref 16–45)
Ferritin: 33 ng/mL (ref 16–288)
Iron: 53 ug/dL (ref 45–160)
TIBC: 346 mcg/dL (calc) (ref 250–450)

## 2018-11-20 LAB — LIPID PANEL
Cholesterol: 144 mg/dL (ref <200)
HDL: 44 mg/dL — ABNORMAL LOW (ref >=50)
LDL Cholesterol (Calc): 71 mg/dL (calc)
Non-HDL Cholesterol (Calc): 100 mg/dL (calc) (ref <130)
Total CHOL/HDL Ratio: 3.3 (calc) (ref <5.0)
Triglycerides: 195 mg/dL — ABNORMAL HIGH (ref <150)

## 2018-11-20 LAB — RPR TITER: RPR Titer: 1:2 {titer} — ABNORMAL HIGH

## 2018-11-20 LAB — FLUORESCENT TREPONEMAL AB(FTA)-IGG-BLD: Fluorescent Treponemal ABS: REACTIVE — AB

## 2018-11-20 LAB — VITAMIN D 25 HYDROXY (VIT D DEFICIENCY, FRACTURES): Vit D, 25-Hydroxy: 14 ng/mL — ABNORMAL LOW (ref 30–100)

## 2018-11-20 LAB — RPR: RPR Ser Ql: REACTIVE — AB

## 2018-11-20 LAB — HIV ANTIBODY (ROUTINE TESTING W REFLEX): HIV 1&2 Ab, 4th Generation: NONREACTIVE

## 2018-11-20 LAB — VITAMIN B12: Vitamin B-12: 755 pg/mL (ref 200–1100)

## 2018-11-21 ENCOUNTER — Other Ambulatory Visit: Payer: Self-pay | Admitting: Family Medicine

## 2018-11-21 DIAGNOSIS — A530 Latent syphilis, unspecified as early or late: Secondary | ICD-10-CM

## 2018-11-22 ENCOUNTER — Other Ambulatory Visit: Payer: Self-pay | Admitting: Family Medicine

## 2018-11-22 MED ORDER — VITAMIN D (ERGOCALCIFEROL) 1.25 MG (50000 UNIT) PO CAPS: 50000.0000 [IU] | ORAL_CAPSULE | ORAL | 0 refills | Status: DC

## 2018-11-23 ENCOUNTER — Telehealth: Payer: Self-pay

## 2018-11-23 NOTE — Telephone Encounter (Signed)
Copied from [place] [phone]. Topic: General - Inquiry >> Nov 22, 2018  4:21 PM Alease Frame wrote: Reason for CRM: Patient returned call from Dr Ancil Boozer. Patient would like a call back  [phone]

## 2018-11-26 DIAGNOSIS — A528 Late syphilis, latent: Secondary | ICD-10-CM | POA: Insufficient documentation

## 2018-11-26 DIAGNOSIS — Z8619 Personal history of other infectious and parasitic diseases: Secondary | ICD-10-CM | POA: Insufficient documentation

## 2018-11-27 ENCOUNTER — Other Ambulatory Visit: Payer: Self-pay

## 2018-11-27 ENCOUNTER — Encounter: Payer: Self-pay | Admitting: Internal Medicine

## 2018-11-27 ENCOUNTER — Ambulatory Visit (INDEPENDENT_AMBULATORY_CARE_PROVIDER_SITE_OTHER): Payer: Commercial Managed Care - PPO | Admitting: Internal Medicine

## 2018-11-27 DIAGNOSIS — A528 Late syphilis, latent: Secondary | ICD-10-CM | POA: Diagnosis not present

## 2018-11-27 MED ORDER — PENICILLIN G BENZATHINE 1200000 UNIT/2ML IM SUSP
1.2000 10*6.[IU] | Freq: Once | INTRAMUSCULAR | Status: AC
  Administered 2018-11-27: 1.2 10*6.[IU] via INTRAMUSCULAR

## 2018-11-27 MED ORDER — PENICILLIN G BENZATHINE 1200000 UNIT/2ML IM SUSP
1.2000 10*6.[IU] | Freq: Once | INTRAMUSCULAR | Status: AC
  Administered 2018-11-27: 15:00:00 1.2 10*6.[IU] via INTRAMUSCULAR

## 2018-11-27 NOTE — Progress Notes (Addendum)
[facility]ious [facility]  Reason for Consult: Late latent syphilis Referring Provider: Dr. Steele Sizer  Assessment: There are 3 potential explanations for her recurrently positive RPR.  First, her treatment last fall may have been insufficient to fully cure her late latent syphilis.  Second, this may represent reinfection.  And lastly, some people remain "serofast" after adequate treatment for syphilis causing them to have intermittent low level positive titers.  Unfortunately there is no way to determine which of the explanations applies in her case.  If she was inadequately treated or is reinfected she needs to be retreated.  She is in agreement with undergoing retreatment with benzathine penicillin 2,400,000 units weekly x 3 doses.  She will follow-up here for repeat RPR testing in 6 months.   Plan: 1. Benzathine penicillin 2,400,000 units IM weekly x 3 doses 2. Follow-up RPR testing here in 6 months  Patient Active Problem List   Diagnosis Date Noted  . 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 Incomplete rotator cuff tear or rupture of left shoulder, not specified as traumatic 07/26/2016  . Thoracic aortic atherosclerosis ([place]) 01/12/2016  . 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 B12 deficiency 01/11/2016  . Hernia of abdominal cavity 09/15/2014  . Impaired renal function 08/10/2014  . 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 Dyslipidemia 08/10/2014  . Interval gout 08/10/2014  . Microalbuminuria 08/10/2014  . Osteoarthritis of left knee 08/10/2014  . Vitamin D deficiency 08/10/2014  . Cough 01/21/2014  . Intraductal papilloma of breast 06/27/2013  . Hypertension, essential, benign 09/02/2009  . ABNORMAL EKG 09/02/2009    Patient's Medications  New Prescriptions   No medications on file  Previous Medications   ALLOPURINOL (ZYLOPRIM) 100 MG TABLET    Take 1 tablet (100 mg total) by mouth  daily.   AMLODIPINE-OLMESARTAN (AZOR) 10-40 MG TABLET    Take 1 tablet by mouth daily.   AMMONIUM LACTATE (LAC-HYDRIN) 12 % CREAM    Apply topically as needed for dry skin.   ASPIRIN 81 MG EC TABLET    Take 81 mg by mouth daily.     ATORVASTATIN (LIPITOR) 40 MG TABLET    Take 1 tablet (40 mg total) by mouth daily.   BUDESONIDE-FORMOTEROL (SYMBICORT) 160-4.5 MCG/ACT INHALER    Inhale 2 puffs into the lungs 2 (two) times daily.   CARVEDILOL (COREG) 12.5 MG TABLET    Take 1 tablet (12.5 mg total) by mouth 2 (two) times daily with a meal.   CYANOCOBALAMIN 500 MCG SUBL    Place 1 tablet under the tongue daily.   FLUTICASONE (FLONASE) 50 MCG/ACT NASAL SPRAY    Place 2 sprays into both nostrils daily.   GLUCOSE BLOOD (FREESTYLE LITE) TEST STRIP    FREESTYLE LITE TEST (In Vitro Strip)  check fsbs once a day for 0 days  Quantity: 100.00;  Refills: 5   Ordered :31-Dec-2009  Steele Sizer MD;  Started 02-Jan-2009 Active Comments: DX: 250.00   LANCETS ULTRA THIN 30G MISC       METFORMIN (GLUCOPHAGE-XR) 750 MG 24 HR TABLET    Take 1 tablet (750 mg total) by mouth daily with breakfast.   TOLTERODINE (DETROL LA) 2 MG 24 HR CAPSULE    Take 1 capsule (2 mg total) by mouth daily.   VITAMIN D, ERGOCALCIFEROL, (DRISDOL) 1.25 MG (50000 UT) CAPS CAPSULE    Take 1 capsule (50,000 Units total) by mouth  every 7 (seven) days.  Modified Medications   No medications on file  Discontinued Medications   No medications on file    HPI: Cheryl Barr is a 63 y.o. female who was diagnosed with late latent syphilis 1 year ago.  Her RPR was positive at a titer of 1:4 and confirmed by a positive fluorescent treponemal antibody test.  Her long-term female partner was also tested for syphilis.  She believes he was positive and was treated with an oral antibiotic.  She has not had any other partners.  She believes that her female partner must have had other partners.  She went to the health department and received a standard  treatment for late latent syphilis with benzathine penicillin 2,400,000 units IM weekly for 3 doses.  A repeat RPR in February was nonreactive.  When repeated again on 11/15/2018 it was positive again at 1:2 and, again, confirmed with a positive fluorescent treponemal antibody test.  She has not had any genital lesions that she is aware of.  She recently tested negative for HIV infection.  Review of Systems: Review of Systems  Constitutional: Negative for fever.  Genitourinary: Negative for dysuria.      Past Medical History:  Diagnosis Date  . Arthritis   . Colon polyp 2009  . Depression   . Diabetes mellitus    Type 2  . Fibromyalgia   . Gout   . Hyperlipidemia   . Hypertension   . Insomnia   . Menopause   . Vitamin D deficiency     Social History   Tobacco Use  . Smoking status: Former Smoker    Types: Cigarettes    Quit date: 10/02/1983    Years since quitting: 35.1  . Smokeless tobacco: Never Used  Substance Use Topics  . Alcohol use: No    Alcohol/week: 0.0 standard drinks  . Drug use: No    Family History  Problem Relation Age of Onset  . Hypertension Mother   . Diabetes Mother   . Heart attack Father   . Coronary artery disease Father   . Breast cancer Father 4  . Allergies Son        Barista  . Diabetes Sister   . Diabetes Brother   . Gout Brother    No Known Allergies  OBJECTIVE: Vitals:   11/27/18 1453  BP: 130/87  Pulse: 82  Temp: 98.8 F (37.1 C)  TempSrc: Oral  SpO2: 100%  Weight: 217 lb (98.4 kg)   Body mass index is 33.99 kg/m.   Physical Exam Constitutional:      Comments: She is pleasant and in no distress.  Eyes:     Conjunctiva/sclera: Conjunctivae normal.     Pupils: Pupils are equal, round, and reactive to light.  Cardiovascular:     Rate and Rhythm: Normal rate and regular rhythm.     Heart sounds: No murmur.  Pulmonary:     Effort: Pulmonary effort is normal.     Breath sounds: Normal breath sounds.   Skin:    Findings: No rash.  Neurological:     General: No focal deficit present.  Psychiatric:        Mood and Affect: Mood normal.     Microbiology: No results found for this or any previous visit (from the past 240 hour(s)).  Michel Bickers, MD [facility] [phone] pager   [phone] cell 11/27/2018, 3:17 PM

## 2018-11-27 NOTE — Addendum Note (Signed)
Addended by: Carlean Purl on: 11/27/2018 03:26 PM   Modules accepted: Orders

## 2018-11-27 NOTE — Assessment & Plan Note (Signed)
There are 3 potential explanations for her recurrently positive RPR.  First, her treatment last fall may have been insufficient to fully cure her late latent syphilis.  Second, this may represent reinfection.  And lastly, some people remain "serofast" after adequate treatment for syphilis causing them to have intermittent low level positive titers.  Unfortunately there is no way to determine which of the explanations applies in her case.  If she was inadequately treated or is reinfected she needs to be retreated.  She is in agreement with undergoing retreatment with benzathine penicillin 2,400,000 units weekly x 3 doses.  She will follow-up here for repeat RPR testing in 6 months.

## 2018-11-29 ENCOUNTER — Ambulatory Visit: Payer: Commercial Managed Care - PPO | Admitting: Infectious Diseases

## 2018-12-04 ENCOUNTER — Other Ambulatory Visit: Payer: Self-pay

## 2018-12-04 ENCOUNTER — Ambulatory Visit (INDEPENDENT_AMBULATORY_CARE_PROVIDER_SITE_OTHER): Payer: Commercial Managed Care - PPO

## 2018-12-04 DIAGNOSIS — A528 Late syphilis, latent: Secondary | ICD-10-CM | POA: Diagnosis not present

## 2018-12-04 MED ORDER — PENICILLIN G BENZATHINE 1200000 UNIT/2ML IM SUSP
1.2000 10*6.[IU] | Freq: Once | INTRAMUSCULAR | Status: AC
  Administered 2018-12-04: 1.2 10*6.[IU] via INTRAMUSCULAR

## 2018-12-04 NOTE — Progress Notes (Signed)
Cheryl Barr presents in office today for 2/3 Bicillin series treatment. Cheryl Barr tolerated injections well, offered condoms, but refused. She has appointment scheduled for 12/11/2018 for last dose of Bicillion injections.  Cheryl Barr denies any questions or concerns.   Lenore Cordia, [place]

## 2018-12-11 ENCOUNTER — Ambulatory Visit (INDEPENDENT_AMBULATORY_CARE_PROVIDER_SITE_OTHER): Payer: Commercial Managed Care - PPO | Admitting: *Deleted

## 2018-12-11 ENCOUNTER — Other Ambulatory Visit: Payer: Self-pay

## 2018-12-11 DIAGNOSIS — A528 Late syphilis, latent: Secondary | ICD-10-CM | POA: Diagnosis not present

## 2018-12-11 MED ORDER — PENICILLIN G BENZATHINE 1200000 UNIT/2ML IM SUSP
1.2000 10*6.[IU] | Freq: Once | INTRAMUSCULAR | Status: AC
  Administered 2018-12-11: 1.2 10*6.[IU] via INTRAMUSCULAR

## 2018-12-11 MED ORDER — PENICILLIN G BENZATHINE 1200000 UNIT/2ML IM SUSP
1.2000 10*6.[IU] | Freq: Once | INTRAMUSCULAR | Status: AC
  Administered 2018-12-11: 17:00:00 1.2 10*6.[IU] via INTRAMUSCULAR

## 2019-01-14 ENCOUNTER — Other Ambulatory Visit: Payer: Self-pay | Admitting: Family Medicine

## 2019-01-14 DIAGNOSIS — I10 Essential (primary) hypertension: Secondary | ICD-10-CM

## 2019-02-19 ENCOUNTER — Encounter: Payer: Self-pay | Admitting: Family Medicine

## 2019-02-19 ENCOUNTER — Ambulatory Visit (INDEPENDENT_AMBULATORY_CARE_PROVIDER_SITE_OTHER): Payer: Commercial Managed Care - PPO | Admitting: Family Medicine

## 2019-02-19 ENCOUNTER — Other Ambulatory Visit: Payer: Self-pay

## 2019-02-19 VITALS — BP 142/82 | HR 85 | Temp 96.9°F | Ht 67.0 in | Wt 210.2 lb

## 2019-02-19 DIAGNOSIS — I10 Essential (primary) hypertension: Secondary | ICD-10-CM

## 2019-02-19 DIAGNOSIS — E785 Hyperlipidemia, unspecified: Secondary | ICD-10-CM

## 2019-02-19 DIAGNOSIS — J4521 Mild intermittent asthma with (acute) exacerbation: Secondary | ICD-10-CM

## 2019-02-19 DIAGNOSIS — R809 Proteinuria, unspecified: Secondary | ICD-10-CM | POA: Diagnosis not present

## 2019-02-19 DIAGNOSIS — E559 Vitamin D deficiency, unspecified: Secondary | ICD-10-CM

## 2019-02-19 DIAGNOSIS — I700 Atherosclerosis of aorta: Secondary | ICD-10-CM

## 2019-02-19 DIAGNOSIS — M109 Gout, unspecified: Secondary | ICD-10-CM | POA: Diagnosis not present

## 2019-02-19 DIAGNOSIS — N1832 Chronic kidney disease, stage 3b: Secondary | ICD-10-CM

## 2019-02-19 DIAGNOSIS — E1129 Type 2 diabetes mellitus with other diabetic kidney complication: Secondary | ICD-10-CM | POA: Diagnosis not present

## 2019-02-19 DIAGNOSIS — E538 Deficiency of other specified B group vitamins: Secondary | ICD-10-CM

## 2019-02-19 DIAGNOSIS — D638 Anemia in other chronic diseases classified elsewhere: Secondary | ICD-10-CM

## 2019-02-19 LAB — POCT GLYCOSYLATED HEMOGLOBIN (HGB A1C): Hemoglobin A1C: 6.3 % — AB (ref 4.0–5.6)

## 2019-02-19 MED ORDER — CYANOCOBALAMIN 500 MCG SL SUBL: 1.0000 | SUBLINGUAL_TABLET | Freq: Every day | SUBLINGUAL | 1 refills | Status: DC

## 2019-02-19 MED ORDER — ATORVASTATIN CALCIUM 40 MG PO TABS: 40.0000 mg | ORAL_TABLET | Freq: Every day | ORAL | 1 refills | Status: DC

## 2019-02-19 MED ORDER — VITAMIN D (ERGOCALCIFEROL) 1.25 MG (50000 UNIT) PO CAPS: 50000.0000 [IU] | ORAL_CAPSULE | ORAL | 0 refills | Status: DC

## 2019-02-19 MED ORDER — AMLODIPINE-OLMESARTAN 10-40 MG PO TABS: 1.0000 | ORAL_TABLET | Freq: Every day | ORAL | 1 refills | Status: DC

## 2019-02-19 MED ORDER — CARVEDILOL 12.5 MG PO TABS: 12.5000 mg | ORAL_TABLET | Freq: Two times a day (BID) | ORAL | 0 refills | Status: DC

## 2019-02-19 MED ORDER — ALLOPURINOL 100 MG PO TABS: 100.0000 mg | ORAL_TABLET | Freq: Every day | ORAL | 1 refills | Status: DC

## 2019-02-19 MED ORDER — METFORMIN HCL ER 750 MG PO TB24: 750.0000 mg | ORAL_TABLET | Freq: Every day | ORAL | 1 refills | Status: DC

## 2019-02-19 NOTE — Progress Notes (Signed)
Name: Cheryl Barr   MRN: [id number]    DOB: [birth year]   Date:02/19/2019       Progress Note  Subjective  Chief Complaint  Chief Complaint  Patient presents with  . Follow-up  . Hypertension  . Diabetes  . Hyperlipidemia    HPI   HTN: compliant with medication, Azor,and carvedilol,bp is at goal today.Shedenies side effects, no chest pain, palpitation but has some SOB with activity  Hyperlipidemia: LDL at goal done 11/2018, on Atorvastatin and denies myalgias   DMII with microalbuminuria: urine micro was 100 in Dec 2016  Nov 2017 and 01/2019Taking ARB, and last urine micro was back to normal, we will recheck today . She is on  aspirin,  Metfomin, she has symptoms of meralgia paresthetica on right side for a long time, she was on Lyrica and stopped taking it, but states she will resume since burning getting worse again. . She denies polyphagia or polydipsia , she has nocturia.A1C today was 6.3%  Gout: . She is back on Allopurinol now . Doing well at this time. No recent episodes. Last uric acid 6.2.Off HCTZ.Stable   Obesity:she lost 4 lbs since last visit, she states she wants to gain in back. Discussed healthy diet and weight   Nocturia: she does not void frequently during the day, drinks fluids all day without problems, however wakes up multiple times to void during the night, going on for years. She snores , she feels tired when she wakes up in the morning, but denies morning headaches. Discussed sleep study to rule out OSA . Her ESS was 1 and she will not qualify for the test, we will try avoiding fluids at night and we added Detrol back in Summer 2020 but she stopped on her own because it was not improving symptoms.   Cough:seen by Dr. 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 She was diagnosed with RAD discussed daily use. She still has a dry cough but not often now,  no wheezing but has SOB with activity   Latent Syphilis: treated in 2019 and had to be treated again in 2020 when titers went up, she states not sexually since second treatment and will only have sex with partner again once he goes to the health department and has his treatment   Atherosclerosis of aorta: on aspirin and statin therapy  Vitamin B12 deficiency: we will recheck labs and start otc supplementation  Chronic kidney disease: she used to see nephrologist but did not have follow up, she also has anemia of chronic disease we will recheck levels and refer back if needed. No pruritis, good urine output  Patient Active Problem List   Diagnosis Date Noted  . 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 Incomplete rotator cuff tear or rupture of left shoulder, not specified as traumatic 07/26/2016  . Thoracic aortic atherosclerosis ([place]) 01/12/2016  . 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 B12 deficiency 01/11/2016  . Hernia of abdominal cavity 09/15/2014  . Impaired renal function 08/10/2014  . 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 Dyslipidemia 08/10/2014  . Interval gout 08/10/2014  . Microalbuminuria 08/10/2014  . Osteoarthritis of left knee 08/10/2014  . Vitamin D deficiency 08/10/2014  . Cough 01/21/2014  . Intraductal papilloma of breast 06/27/2013  . Hypertension, essential, benign 09/02/2009  . ABNORMAL EKG 09/02/2009    Past Surgical History:  Procedure Laterality  Date  . BREAST BIOPSY Left 05/24/2013   neg  . BREAST EXCISIONAL BIOPSY Left 06/21/2013   neg/ dr. Bary Castilla  . BREAST SURGERY Left 2015  . COLONOSCOPY  2009   1 polyp found. Dr. Roselyn Reef  . HERNIA REPAIR  [id number]   Ventral/umbilical hernia repair with 10 x 10 cm Atrium mesh in preperitoneal position.  . INSERTION OF MESH N/A 10/09/2014   Procedure: INSERTION OF MESH;  Surgeon: Robert Bellow, MD;  Location: [facility] ORS;  Service: General;  Laterality: N/A;  . KNEE SURGERY  Left 01/14/2009  . STRABISMUS SURGERY     Repair as a child  . TUBAL LIGATION  1990  . VENTRAL HERNIA REPAIR N/A 10/09/2014   Procedure: HERNIA REPAIR VENTRAL ADULT;  Surgeon: Robert Bellow, MD;  Location: [facility] ORS;  Service: General;  Laterality: N/A;    Family History  Problem Relation Age of Onset  . Hypertension Mother   . Diabetes Mother   . Heart attack Father   . Coronary artery disease Father   . Breast cancer Father 93  . Allergies Son        Barista  . Diabetes Sister   . Diabetes Brother   . Gout Brother     Social History   Socioeconomic History  . Marital status: Widowed    Spouse name: Not on file  . Number of children: 3  . Years of education: 12th Grade  . Highest education level: Not on file  Occupational History    Employer: [employer]  Tobacco Use  . Smoking status: Former Smoker    Types: Cigarettes    Quit date: 10/02/1983    Years since quitting: 35.4  . Smokeless tobacco: Never Used  Substance and Sexual Activity  . Alcohol use: No    Alcohol/week: 0.0 standard drinks  . Drug use: No  . Sexual activity: Yes    Partners: Male    Birth control/protection: None    Comment: declined condoms   Other Topics Concern  . Not on file  Social History Narrative   Widowed.    No regular exercise.   Social Determinants of Health   Financial Resource Strain:   . Difficulty of Paying Living Expenses: Not on file  Food Insecurity:   . Worried About Charity fundraiser in the Last Year: Not on file  . Ran Out of Food in the Last Year: Not on file  Transportation Needs: No Transportation Needs  . Lack of Transportation (Medical): No  . Lack of Transportation (Non-Medical): No  Physical Activity: Insufficiently Active  . Days of Exercise per Week: 3 days  . Minutes of Exercise per Session: 10 min  Stress: No Stress Concern Present  . Feeling of Stress : Not at all  Social Connections: Not Isolated  . Frequency of  Communication with Friends and Family: More than three times a week  . Frequency of Social Gatherings with Friends and Family: More than three times a week  . Attends Religious Services: More than 4 times per year  . Active Member of Clubs or Organizations: Yes  . Attends Archivist Meetings: More than 4 times per year  . Marital Status: Living with partner  Intimate Partner Violence: Not At Risk  . Fear of Current or Ex-Partner: No  . Emotionally Abused: No  . Physically Abused: No  . Sexually Abused: No     Current Outpatient Medications:  .  allopurinol (ZYLOPRIM) 100  MG tablet, Take 1 tablet (100 mg total) by mouth daily., Disp: 90 tablet, Rfl: 1 .  amLODipine-olmesartan (AZOR) 10-40 MG tablet, Take 1 tablet by mouth daily., Disp: 90 tablet, Rfl: 1 .  aspirin 81 MG EC tablet, Take 81 mg by mouth daily.  , Disp: , Rfl:  .  atorvastatin (LIPITOR) 40 MG tablet, Take 1 tablet (40 mg total) by mouth daily., Disp: 90 tablet, Rfl: 1 .  carvedilol (COREG) 12.5 MG tablet, Take 1 tablet (12.5 mg total) by mouth 2 (two) times daily with a meal., Disp: 180 tablet, Rfl: 0 .  Cyanocobalamin 500 MCG SUBL, Place 1 tablet (500 mcg total) under the tongue daily., Disp: 90 tablet, Rfl: 1 .  glucose blood (FREESTYLE LITE) test strip, FREESTYLE LITE TEST (In Vitro Strip)  check fsbs once a day for 0 days  Quantity: 100.00;  Refills: 5   Ordered :31-Dec-2009  Steele Sizer MD;  Dani Gobble Active Comments: DX: 250.00, Disp: , Rfl:  .  LANCETS ULTRA THIN 30G MISC, , Disp: , Rfl:  .  metFORMIN (GLUCOPHAGE-XR) 750 MG 24 hr tablet, Take 1 tablet (750 mg total) by mouth daily with breakfast., Disp: 90 tablet, Rfl: 1 .  Vitamin D, Ergocalciferol, (DRISDOL) 1.25 MG (50000 UT) CAPS capsule, Take 1 capsule (50,000 Units total) by mouth every 7 (seven) days., Disp: 12 capsule, Rfl: 0 .  ammonium lactate (LAC-HYDRIN) 12 % cream, Apply topically as needed for dry skin. (Patient not taking: Reported on  02/19/2019), Disp: 385 g, Rfl: 0 .  budesonide-formoterol (SYMBICORT) 160-4.5 MCG/ACT inhaler, Inhale 2 puffs into the lungs 2 (two) times daily. (Patient not taking: Reported on 02/19/2019), Disp: 1 Inhaler, Rfl: 2 .  fluticasone (FLONASE) 50 MCG/ACT nasal spray, Place 2 sprays into both nostrils daily. (Patient not taking: Reported on 02/19/2019), Disp: 16 g, Rfl: 6  No Known Allergies  I personally reviewed active problem list, medication list, allergies, family history, social history, health maintenance with the patient/caregiver today.   ROS  Constitutional: Negative for fever or weight change.  Respiratory: Negative for cough and shortness of breath.   Cardiovascular: Negative for chest pain or palpitations.  Gastrointestinal: Negative for abdominal pain, no bowel changes.  Musculoskeletal: Negative for gait problem or joint swelling.  Skin: Negative for rash.  Neurological: Negative for dizziness or headache.  No other specific complaints in a complete review of systems (except as listed in HPI above).  Objective  Vitals:   02/19/19 0801  BP: (!) 142/82  Pulse: 85  Temp: (!) 96.9 F (36.1 C)  TempSrc: Temporal  SpO2: 99%  Weight: 210 lb 3.2 oz (95.3 kg)  Height: 5\' 7"  (1.702 m)     Body mass index is 32.92 kg/m.  Physical Exam  Constitutional: Patient appears well-developed and well-nourished. Obese  No distress.  HEENT: head atraumatic, normocephalic, pupils equal and reactive to light Cardiovascular: Normal rate, regular rhythm and normal heart sounds.  No murmur heard. No BLE edema. Pulmonary/Chest: Effort normal and breath sounds normal. No respiratory distress. Abdominal: Soft.  There is no tenderness. Psychiatric: Patient has a normal mood and affect. behavior is normal. Judgment and thought content normal.  PHQ2/9: Depression screen [facility] 2/9 02/19/2019 11/27/2018 11/15/2018 08/14/2018 05/30/2018  Decreased Interest 0 0 0 0 0  Down, Depressed, Hopeless 0 0 0 0 0   PHQ - 2 Score 0 0 0 0 0  Altered sleeping 0 - 0 0 1  Tired, decreased energy 0 - 0 0 1  Change in appetite 0 - 0 0 0  Feeling bad or failure about yourself  0 - 0 0 0  Trouble concentrating 0 - 0 0 0  Moving slowly or fidgety/restless 0 - 0 0 0  Suicidal thoughts 0 - 0 0 0  PHQ-9 Score 0 - 0 0 2  Difficult doing work/chores Not difficult at all - Not difficult at all - Not difficult at all  Some recent data might be hidden    phq 9 is negative   Fall Risk: Fall Risk  02/19/2019 11/27/2018 11/15/2018 08/14/2018 05/30/2018  Falls in the past year? 0 0 0 0 0  Number falls in past yr: 0 - 0 0 0  Injury with Fall? 0 - 0 0 0  Follow up Falls evaluation completed Falls evaluation completed - - -    Assessment & Plan  1. Controlled type 2 diabetes mellitus with microalbuminuria, without long-term current use of insulin (HCC)  - POCT HgB A1C - metFORMIN (GLUCOPHAGE-XR) 750 MG 24 hr tablet; Take 1 tablet (750 mg total) by mouth daily with breakfast.  Dispense: 90 tablet; Refill: 1 - Microalbumin / creatinine urine ratio  2. Hypertension, essential, benign  - carvedilol (COREG) 12.5 MG tablet; Take 1 tablet (12.5 mg total) by mouth 2 (two) times daily with a meal.  Dispense: 180 tablet; Refill: 0 - amLODipine-olmesartan (AZOR) 10-40 MG tablet; Take 1 tablet by mouth daily.  Dispense: 90 tablet; Refill: 1  3. Dyslipidemia  - atorvastatin (LIPITOR) 40 MG tablet; Take 1 tablet (40 mg total) by mouth daily.  Dispense: 90 tablet; Refill: 1  4. Controlled gout  - allopurinol (ZYLOPRIM) 100 MG tablet; Take 1 tablet (100 mg total) by mouth daily.  Dispense: 90 tablet; Refill: 1  5. B12 deficiency  - Cyanocobalamin 500 MCG SUBL; Place 1 tablet (500 mcg total) under the tongue daily.  Dispense: 90 tablet; Refill: 1 - Vitamin B12  6. Vitamin D deficiency  - Vitamin D, Ergocalciferol, (DRISDOL) 1.25 MG (50000 UT) CAPS capsule; Take 1 capsule (50,000 Units total) by mouth every 7 (seven)  days.  Dispense: 12 capsule; Refill: 0  7. Mild intermittent reactive airway disease with acute exacerbation  Discussed resuming Symbicort   8. Thoracic aortic atherosclerosis (HCC)  Continue aspirin and statin therapy   9. Anemia of chronic disease  - CBC with Differential/Platelet  10. Stage 3b chronic kidney disease  - COMPLETE METABOLIC PANEL WITH GFR - Parathyroid hormone, intact (no Ca) - VITAMIN D 25 Hydroxy (Vit-D Deficiency, Fractures) - Microalbumin / creatinine urine ratio

## 2019-02-20 LAB — COMPLETE METABOLIC PANEL WITH GFR
AG Ratio: 1.2 (calc) (ref 1.0–2.5)
ALT: 11 U/L (ref 6–29)
AST: 13 U/L (ref 10–35)
Albumin: 4.1 g/dL (ref 3.6–5.1)
Alkaline phosphatase (APISO): 83 U/L (ref 37–153)
BUN/Creatinine Ratio: 21 (calc) (ref 6–22)
BUN: 30 mg/dL — ABNORMAL HIGH (ref 7–25)
CO2: 31 mmol/L (ref 20–32)
Calcium: 10.1 mg/dL (ref 8.6–10.4)
Chloride: 105 mmol/L (ref 98–110)
Creat: 1.44 mg/dL — ABNORMAL HIGH (ref 0.50–0.99)
GFR, Est African American: 45 mL/min/{1.73_m2} — ABNORMAL LOW (ref >=60)
GFR, Est Non African American: 39 mL/min/{1.73_m2} — ABNORMAL LOW (ref >=60)
Globulin: 3.5 g/dL (calc) (ref 1.9–3.7)
Glucose, Bld: 128 mg/dL — ABNORMAL HIGH (ref 65–99)
Potassium: 4.1 mmol/L (ref 3.5–5.3)
Sodium: 144 mmol/L (ref 135–146)
Total Bilirubin: 0.3 mg/dL (ref 0.2–1.2)
Total Protein: 7.6 g/dL (ref 6.1–8.1)

## 2019-02-20 LAB — MICROALBUMIN / CREATININE URINE RATIO
Creatinine, Urine: 96 mg/dL (ref 20–275)
Microalb Creat Ratio: 1458 mcg/mg creat — ABNORMAL HIGH (ref <30)
Microalb, Ur: 140 mg/dL

## 2019-02-20 LAB — CBC WITH DIFFERENTIAL/PLATELET
Absolute Monocytes: 319 cells/uL (ref 200–950)
Basophils Absolute: 39 cells/uL (ref 0–200)
Basophils Relative: 0.6 %
Eosinophils Absolute: 137 cells/uL (ref 15–500)
Eosinophils Relative: 2.1 %
HCT: 34.8 % — ABNORMAL LOW (ref 35.0–45.0)
Hemoglobin: 11.1 g/dL — ABNORMAL LOW (ref 11.7–15.5)
Lymphs Abs: 2899 cells/uL (ref 850–3900)
MCH: 25.9 pg — ABNORMAL LOW (ref 27.0–33.0)
MCHC: 31.9 g/dL — ABNORMAL LOW (ref 32.0–36.0)
MCV: 81.1 fL (ref 80.0–100.0)
MPV: 9.2 fL (ref 7.5–12.5)
Monocytes Relative: 4.9 %
Neutro Abs: 3107 cells/uL (ref 1500–7800)
Neutrophils Relative %: 47.8 %
Platelets: 365 10*3/uL (ref 140–400)
RBC: 4.29 10*6/uL (ref 3.80–5.10)
RDW: 15.1 % — ABNORMAL HIGH (ref 11.0–15.0)
Total Lymphocyte: 44.6 %
WBC: 6.5 10*3/uL (ref 3.8–10.8)

## 2019-02-20 LAB — PARATHYROID HORMONE, INTACT (NO CA): PTH: 121 pg/mL — ABNORMAL HIGH (ref 14–64)

## 2019-02-20 LAB — VITAMIN B12: Vitamin B-12: 607 pg/mL (ref 200–1100)

## 2019-02-20 LAB — VITAMIN D 25 HYDROXY (VIT D DEFICIENCY, FRACTURES): Vit D, 25-Hydroxy: 61 ng/mL (ref 30–100)

## 2019-02-24 ENCOUNTER — Other Ambulatory Visit: Payer: Self-pay | Admitting: Family Medicine

## 2019-02-24 DIAGNOSIS — N1832 Chronic kidney disease, stage 3b: Secondary | ICD-10-CM

## 2019-02-24 DIAGNOSIS — E1129 Type 2 diabetes mellitus with other diabetic kidney complication: Secondary | ICD-10-CM

## 2019-02-24 DIAGNOSIS — E1121 Type 2 diabetes mellitus with diabetic nephropathy: Secondary | ICD-10-CM

## 2019-02-24 DIAGNOSIS — R809 Proteinuria, unspecified: Secondary | ICD-10-CM

## 2019-03-27 ENCOUNTER — Encounter: Payer: Self-pay | Admitting: Emergency Medicine

## 2019-03-27 ENCOUNTER — Emergency Department
Admission: EM | Admit: 2019-03-27 | Discharge: 2019-03-27 | Disposition: A | Discharge status: Home / Self Care | Payer: Commercial Managed Care - PPO | Attending: Emergency Medicine | Admitting: Emergency Medicine

## 2019-03-27 ENCOUNTER — Emergency Department: Payer: Commercial Managed Care - PPO

## 2019-03-27 ENCOUNTER — Other Ambulatory Visit: Payer: Self-pay

## 2019-03-27 DIAGNOSIS — Z7982 Long term (current) use of aspirin: Secondary | ICD-10-CM | POA: Diagnosis not present

## 2019-03-27 DIAGNOSIS — Z79899 Other long term (current) drug therapy: Secondary | ICD-10-CM | POA: Insufficient documentation

## 2019-03-27 DIAGNOSIS — U071 COVID-19: Secondary | ICD-10-CM | POA: Diagnosis not present

## 2019-03-27 DIAGNOSIS — E119 Type 2 diabetes mellitus without complications: Secondary | ICD-10-CM | POA: Diagnosis not present

## 2019-03-27 DIAGNOSIS — Z7984 Long term (current) use of oral hypoglycemic drugs: Secondary | ICD-10-CM | POA: Insufficient documentation

## 2019-03-27 DIAGNOSIS — R531 Weakness: Secondary | ICD-10-CM | POA: Diagnosis present

## 2019-03-27 DIAGNOSIS — I10 Essential (primary) hypertension: Secondary | ICD-10-CM | POA: Insufficient documentation

## 2019-03-27 DIAGNOSIS — Z87891 Personal history of nicotine dependence: Secondary | ICD-10-CM | POA: Insufficient documentation

## 2019-03-27 LAB — COMPREHENSIVE METABOLIC PANEL
ALT: 16 U/L (ref 0–44)
AST: 17 U/L (ref 15–41)
Albumin: 3.8 g/dL (ref 3.5–5.0)
Alkaline Phosphatase: 79 U/L (ref 38–126)
Anion gap: 10 (ref 5–15)
BUN: 39 mg/dL — ABNORMAL HIGH (ref 8–23)
CO2: 26 mmol/L (ref 22–32)
Calcium: 8.9 mg/dL (ref 8.9–10.3)
Chloride: 105 mmol/L (ref 98–111)
Creatinine, Ser: 1.71 mg/dL — ABNORMAL HIGH (ref 0.44–1.00)
GFR calc Af Amer: 36 mL/min — ABNORMAL LOW (ref >60)
GFR calc non Af Amer: 31 mL/min — ABNORMAL LOW (ref >60)
Glucose, Bld: 137 mg/dL — ABNORMAL HIGH (ref 70–99)
Potassium: 3.1 mmol/L — ABNORMAL LOW (ref 3.5–5.1)
Sodium: 141 mmol/L (ref 135–145)
Total Bilirubin: 0.4 mg/dL (ref 0.3–1.2)
Total Protein: 7.8 g/dL (ref 6.5–8.1)

## 2019-03-27 LAB — URINALYSIS, COMPLETE (UACMP) WITH MICROSCOPIC
Bacteria, UA: NONE SEEN
Bilirubin Urine: NEGATIVE
Glucose, UA: NEGATIVE mg/dL
Hgb urine dipstick: NEGATIVE
Ketones, ur: NEGATIVE mg/dL
Leukocytes,Ua: NEGATIVE
Nitrite: NEGATIVE
Protein, ur: >=300 mg/dL — AB
Specific Gravity, Urine: 1.014 (ref 1.005–1.030)
pH: 6 (ref 5.0–8.0)

## 2019-03-27 LAB — CBC WITH DIFFERENTIAL/PLATELET
Abs Immature Granulocytes: 0.01 10*3/uL (ref 0.00–0.07)
Basophils Absolute: 0 10*3/uL (ref 0.0–0.1)
Basophils Relative: 0 %
Eosinophils Absolute: 0 10*3/uL (ref 0.0–0.5)
Eosinophils Relative: 0 %
HCT: 32.8 % — ABNORMAL LOW (ref 36.0–46.0)
Hemoglobin: 10.6 g/dL — ABNORMAL LOW (ref 12.0–15.0)
Immature Granulocytes: 0 %
Lymphocytes Relative: 28 %
Lymphs Abs: 1.8 10*3/uL (ref 0.7–4.0)
MCH: 26.3 pg (ref 26.0–34.0)
MCHC: 32.3 g/dL (ref 30.0–36.0)
MCV: 81.4 fL (ref 80.0–100.0)
Monocytes Absolute: 0.6 10*3/uL (ref 0.1–1.0)
Monocytes Relative: 9 %
Neutro Abs: 4.1 10*3/uL (ref 1.7–7.7)
Neutrophils Relative %: 63 %
Platelets: 255 10*3/uL (ref 150–400)
RBC: 4.03 MIL/uL (ref 3.87–5.11)
RDW: 15 % (ref 11.5–15.5)
WBC: 6.5 10*3/uL (ref 4.0–10.5)
nRBC: 0 % (ref 0.0–0.2)

## 2019-03-27 LAB — POC SARS CORONAVIRUS 2 AG: SARS Coronavirus 2 Ag: POSITIVE — AB

## 2019-03-27 LAB — TROPONIN I (HIGH SENSITIVITY): Troponin I (High Sensitivity): 16 ng/L (ref <18)

## 2019-03-27 MED ORDER — AZITHROMYCIN 500 MG PO TABS
500.0000 mg | ORAL_TABLET | Freq: Once | ORAL | Status: AC
  Administered 2019-03-27: 500 mg via ORAL
  Filled 2019-03-27: qty 1

## 2019-03-27 MED ORDER — ALBUTEROL SULFATE HFA 108 (90 BASE) MCG/ACT IN AERS: 2.0000 | INHALATION_SPRAY | Freq: Four times a day (QID) | RESPIRATORY_TRACT | 0 refills | Status: DC | PRN

## 2019-03-27 MED ORDER — BENZONATATE 100 MG PO CAPS: 100.0000 mg | ORAL_CAPSULE | Freq: Three times a day (TID) | ORAL | 0 refills | Status: DC | PRN

## 2019-03-27 MED ORDER — PREDNISONE 20 MG PO TABS: 60.0000 mg | ORAL_TABLET | Freq: Every day | ORAL | 0 refills | Status: AC

## 2019-03-27 MED ORDER — AZITHROMYCIN 500 MG PO TABS: 500.0000 mg | ORAL_TABLET | Freq: Every day | ORAL | 0 refills | Status: AC

## 2019-03-27 MED ORDER — ONDANSETRON 4 MG PO TBDP: 4.0000 mg | ORAL_TABLET | Freq: Three times a day (TID) | ORAL | 0 refills | Status: DC | PRN

## 2019-03-27 MED ORDER — SODIUM CHLORIDE 0.9 % IV BOLUS
1000.0000 mL | Freq: Once | INTRAVENOUS | Status: AC
  Administered 2019-03-27: 1000 mL via INTRAVENOUS

## 2019-03-27 MED ORDER — PREDNISONE 20 MG PO TABS
60.0000 mg | ORAL_TABLET | Freq: Once | ORAL | Status: AC
  Administered 2019-03-27: 60 mg via ORAL
  Filled 2019-03-27: qty 3

## 2019-03-27 NOTE — ED Notes (Signed)
Pt reports that she has felt weak and tired over the past few days. Pt denies fever, aches, SOB, N/V, diarrhea. Reports that she hasn't been in contact with anyone who has covid.

## 2019-03-27 NOTE — ED Provider Notes (Signed)
[facility] Emergency Department Provider Note  ____________________________________________   First MD Initiated Contact with Patient 03/27/19 0254     (approximate)  I have reviewed the triage vital signs and the nursing notes.   HISTORY  Chief Complaint Weakness    HPI Cheryl Barr is a 64 y.o. female presents to the emergency department secondary to generalized weakness and fatigue tonight with associated subjective fever.  Patient does admit to occasional cough.  Patient denies any known sick contact.  Patient denies any nausea vomiting diarrhea constipation.  Patient denies any urinary symptoms.        Past Medical History:  Diagnosis Date  . Arthritis   . Colon polyp 2009  . Depression   . Diabetes mellitus    Type 2  . Fibromyalgia   . Gout   . Hyperlipidemia   . Hypertension   . Insomnia   . Menopause   . Vitamin D deficiency     Patient Active Problem List   Diagnosis Date Noted  . 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 Incomplete rotator cuff tear or rupture of left shoulder, not specified as traumatic 07/26/2016  . Thoracic aortic atherosclerosis ([place]) 01/12/2016  . 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 B12 deficiency 01/11/2016  . Hernia of abdominal cavity 09/15/2014  . Impaired renal function 08/10/2014  . 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 Dyslipidemia 08/10/2014  . Interval gout 08/10/2014  . Microalbuminuria 08/10/2014  . Osteoarthritis of left knee 08/10/2014  . Vitamin D deficiency 08/10/2014  . Cough 01/21/2014  . Intraductal papilloma of breast 06/27/2013  . Hypertension, essential, benign 09/02/2009  . ABNORMAL EKG 09/02/2009    Past Surgical History:  Procedure Laterality Date  . BREAST BIOPSY Left 05/24/2013   neg  . BREAST EXCISIONAL BIOPSY Left 06/21/2013   neg/ dr. Bary Castilla  . BREAST SURGERY Left 2015  . COLONOSCOPY  2009   1 polyp found. Dr. Roselyn Reef  .  HERNIA REPAIR  [id number]   Ventral/umbilical hernia repair with 10 x 10 cm Atrium mesh in preperitoneal position.  . INSERTION OF MESH N/A 10/09/2014   Procedure: INSERTION OF MESH;  Surgeon: Robert Bellow, MD;  Location: [facility] ORS;  Service: General;  Laterality: N/A;  . KNEE SURGERY Left 01/14/2009  . STRABISMUS SURGERY     Repair as a child  . TUBAL LIGATION  1990  . VENTRAL HERNIA REPAIR N/A 10/09/2014   Procedure: HERNIA REPAIR VENTRAL ADULT;  Surgeon: Robert Bellow, MD;  Location: [facility] ORS;  Service: General;  Laterality: N/A;    Prior to Admission medications   Medication Sig Start Date End Date Taking? Authorizing Provider  allopurinol (ZYLOPRIM) 100 MG tablet Take 1 tablet (100 mg total) by mouth daily. 02/19/19   Steele Sizer, MD  amLODipine-olmesartan (AZOR) 10-40 MG tablet Take 1 tablet by mouth daily. 02/19/19   Steele Sizer, MD  ammonium lactate (LAC-HYDRIN) 12 % cream Apply topically as needed for dry skin. Patient not taking: Reported on 02/19/2019 08/23/17   Steele Sizer, MD  aspirin 81 MG EC tablet Take 81 mg by mouth daily.      [provider]  atorvastatin (LIPITOR) 40 MG tablet Take 1 tablet (40 mg total) by mouth daily. 02/19/19   Steele Sizer, MD  budesonide-formoterol (SYMBICORT) 160-4.5 MCG/ACT inhaler Inhale 2 puffs into the lungs 2 (two) times daily. Patient not taking: Reported on 02/19/2019 04/03/18   Steele Sizer, MD  carvedilol (COREG) 12.5  MG tablet Take 1 tablet (12.5 mg total) by mouth 2 (two) times daily with a meal. 02/19/19   Sowles, Drue Stager, MD  Cyanocobalamin 500 MCG SUBL Place 1 tablet (500 mcg total) under the tongue daily. 02/19/19   Steele Sizer, MD  fluticasone (FLONASE) 50 MCG/ACT nasal spray Place 2 sprays into both nostrils daily. Patient not taking: Reported on 02/19/2019 04/06/17   Hubbard Hartshorn, FNP  glucose blood (FREESTYLE LITE) test strip FREESTYLE LITE TEST (In Vitro Strip)  check fsbs once a day for 0 days  Quantity:  100.00;  Refills: 5   Ordered :31-Dec-2009  Steele Sizer MD;  Buddy Duty 02-Jan-2009 Active Comments: DX: 250.00 01/02/09   [provider]  LANCETS ULTRA THIN 30G [place]     [provider]  metFORMIN (GLUCOPHAGE-XR) 750 MG 24 hr tablet Take 1 tablet (750 mg total) by mouth daily with breakfast. 02/19/19   Steele Sizer, MD  Vitamin D, Ergocalciferol, (DRISDOL) 1.25 MG (50000 UT) CAPS capsule Take 1 capsule (50,000 Units total) by mouth every 7 (seven) days. 02/19/19   Steele Sizer, MD    Allergies Patient has no known allergies.  Family History  Problem Relation Age of Onset  . Hypertension Mother   . Diabetes Mother   . Heart attack Father   . Coronary artery disease Father   . Breast cancer Father 50  . Allergies Son        Barista  . Diabetes Sister   . Diabetes Brother   . Gout Brother     Social History Social History   Tobacco Use  . Smoking status: Former Smoker    Types: Cigarettes    Quit date: 10/02/1983    Years since quitting: 35.5  . Smokeless tobacco: Never Used  Substance Use Topics  . Alcohol use: No    Alcohol/week: 0.0 standard drinks  . Drug use: No    Review of Systems Constitutional: Positive for fever/chills Eyes: No visual changes. ENT: No sore throat. Cardiovascular: Denies chest pain. Respiratory: Denies shortness of breath. Gastrointestinal: No abdominal pain.  No nausea, no vomiting.  No diarrhea.  No constipation. Genitourinary: Negative for dysuria. Musculoskeletal: Negative for neck pain.  Negative for back pain. Integumentary: Negative for rash. Neurological: Negative for headaches, focal weakness or numbness.   ____________________________________________   PHYSICAL EXAM:  VITAL SIGNS: ED Triage Vitals  Enc Vitals Group     BP 03/27/19 0048 (!) 145/100     Pulse Rate 03/27/19 0048 97     Resp 03/27/19 0048 18     Temp 03/27/19 0048 (!) 100.7 F (38.2 C)     Temp Source 03/27/19 0048 Oral      SpO2 03/27/19 0048 98 %     Weight 03/27/19 0049 97.5 kg (215 lb)     Height 03/27/19 0049 1.702 m (5\' 7" )     Head Circumference --      Peak Flow --      Pain Score 03/27/19 0049 0     Pain Loc --      Pain Edu? --      Excl. in [place]? --     Constitutional: Alert and oriented.  Eyes: Conjunctivae are normal.  Head: Atraumatic. Mouth/Throat: Patient is wearing a mask. Neck: No stridor.  No meningeal signs.   Cardiovascular: Normal rate, regular rhythm. Good peripheral circulation. Grossly normal heart sounds. Respiratory: Normal respiratory effort.  No retractions. Gastrointestinal: Soft and nontender. No distention.   Musculoskeletal: No  lower extremity tenderness nor edema. No gross deformities of extremities. Neurologic:  Normal speech and language. No gross focal neurologic deficits are appreciated.  Skin:  Skin is warm, dry and intact. Psychiatric: Mood and affect are normal. Speech and behavior are normal.  ____________________________________________   LABS (all labs ordered are listed, but only abnormal results are displayed)  Labs Reviewed  CBC WITH DIFFERENTIAL/PLATELET - Abnormal; Notable for the following components:      Result Value   Hemoglobin 10.6 (*)    HCT 32.8 (*)    All other components within normal limits  COMPREHENSIVE METABOLIC PANEL - Abnormal; Notable for the following components:   Potassium 3.1 (*)    Glucose, Bld 137 (*)    BUN 39 (*)    Creatinine, Ser 1.71 (*)    GFR calc non Af Amer 31 (*)    GFR calc Af Amer 36 (*)    All other components within normal limits  URINALYSIS, COMPLETE (UACMP) WITH MICROSCOPIC - Abnormal; Notable for the following components:   Color, Urine YELLOW (*)    APPearance HAZY (*)    Protein, ur >=300 (*)    All other components within normal limits  POC SARS CORONAVIRUS 2 AG - Abnormal; Notable for the following components:   SARS Coronavirus 2 Ag POSITIVE (*)    All other components within normal limits   RESPIRATORY PANEL BY RT PCR (FLU A&B, COVID)  POC SARS CORONAVIRUS 2 AG -  ED  TROPONIN I (HIGH SENSITIVITY)   ____________________________________________  EKG  ED ECG REPORT I, [place] N Gredmarie Delange, the attending physician, personally viewed and interpreted this ECG.   Date: 03/27/2019  EKG Time: 12:49 AM  Rate: 97  Rhythm: Normal sinus rhythm  Axis: Normal  Intervals: Normal  ST&T Change: None  ____________________________________________  RADIOLOGY I, [place] N Yamilet Mcfayden, personally viewed and evaluated these images (plain radiographs) as part of my medical decision making, as well as reviewing the written report by the radiologist.  ED MD interpretation: Mild hazy airspace opacities in the left upper lobe and right lower lung  Official radiology report(s): DG Chest 2 View  Result Date: 03/27/2019 CLINICAL DATA:  Fever, tired and weakness EXAM: CHEST - 2 VIEW COMPARISON:  April 06, 2017 FINDINGS: The heart size and mediastinal contours are within normal limits. There is mildly increased hazy airspace opacity seen within the right lower lung and the left upper lobe. No large airspace consolidation or pleural effusion. The visualized skeletal structures are unremarkable. IMPRESSION: Mild hazy airspace opacities in the left upper lobe and right lower lung which could be due to atelectasis and/or early infectious etiology. Electronically Signed   By: Prudencio Pair M.D.   On: 03/27/2019 03:27      Procedures   ____________________________________________   INITIAL IMPRESSION / MDM / [place] / ED COURSE  As part of my medical decision making, I reviewed the following data within the electronic MEDICAL RECORD NUMBER   64 year old female presented with above-stated history and physical exam concerning for COVID-19 infection which was confirmed in the emergency department.  Patient's oxygen saturation predominately 98 to 100% with lowest oxygenation noted briefly at 96%.   Patient without any increased work of breathing no dyspnea at present.  Patient will be prescribed albuterol prednisone and azithromycin for home as well as Zofran and Tessalon Perles.  Patient advised of warning signs that would warrant immediate emergency medical evaluation.  ____________________________________________  FINAL CLINICAL IMPRESSION(S) / ED DIAGNOSES  Final  diagnoses:  COVID-19     MEDICATIONS GIVEN DURING THIS VISIT:  Medications  predniSONE (DELTASONE) tablet 60 mg (has no administration in time range)  azithromycin (ZITHROMAX) tablet 500 mg (has no administration in time range)     ED Discharge Orders    None      *Please note:  Cheryl Barr was evaluated in Emergency Department on 03/27/2019 for the symptoms described in the history of present illness. She was evaluated in the context of the global COVID-19 pandemic, which necessitated consideration that the patient might be at risk for infection with the SARS-CoV-2 virus that causes COVID-19. Institutional protocols and algorithms that pertain to the evaluation of patients at risk for COVID-19 are in a state of rapid change based on information released by regulatory bodies including the CDC and federal and state organizations. These policies and algorithms were followed during the patient's care in the ED.  Some ED evaluations and interventions may be delayed as a result of limited staffing during the pandemic.*  Note:  This document was prepared using Dragon voice recognition software and may include unintentional dictation errors.   Gregor Hams, MD 03/27/19 [phone]

## 2019-03-27 NOTE — ED Triage Notes (Signed)
Patient ambulatory to triage with steady gait, without difficulty or distress noted, mask in place; pt reports feeling "tired, weak" tonight; denies any accomp symptoms or recent illness

## 2019-03-27 NOTE — ED Notes (Signed)
Pt lying in bed. Denies pain. Respirations are equal and unlabored, no signs of acute distress. Denies any further needs at this time.

## 2019-03-28 ENCOUNTER — Telehealth: Payer: Self-pay | Admitting: Physician Assistant

## 2019-03-28 NOTE — Telephone Encounter (Signed)
Called to discuss with patient about Covid symptoms and the use of bamlanivimab or casirivimab/imdevimab, a monoclonal antibody infusion for those with mild to moderate Covid symptoms and at a high risk of hospitalization.  Pt is qualified for this infusion at the [facility] infusion center due to Age >55 with HTN   Message left with daughter for patient to call back  Angelena Form PA-C  [facility]

## 2019-03-28 NOTE — Telephone Encounter (Signed)
Patient called back and wants to do it but would prefer to do in [place]. I gave her the information for the [place] clinic. If they cannot get her in they will call me back and get set up for [facility] with [place].   Angelena Form PA-C  [facility]

## 2019-03-29 DIAGNOSIS — U071 COVID-19: Secondary | ICD-10-CM | POA: Insufficient documentation

## 2019-04-08 ENCOUNTER — Telehealth: Payer: Self-pay | Admitting: Family Medicine

## 2019-04-08 NOTE — Telephone Encounter (Signed)
Pt had covid and will going back to work and wanted to speak with Dr. Ancil Boozer about an extended release out of work this week/ pts cough is not completely gone and she doesn't want to start back at work just to get sent home again/ please advise

## 2019-04-09 NOTE — Telephone Encounter (Signed)
Pt is scheduled °

## 2019-04-10 ENCOUNTER — Ambulatory Visit (INDEPENDENT_AMBULATORY_CARE_PROVIDER_SITE_OTHER): Payer: Commercial Managed Care - PPO | Admitting: Family Medicine

## 2019-04-10 ENCOUNTER — Encounter: Payer: Self-pay | Admitting: Family Medicine

## 2019-04-10 DIAGNOSIS — Z8616 Personal history of COVID-19: Secondary | ICD-10-CM | POA: Diagnosis not present

## 2019-04-10 DIAGNOSIS — R059 Cough, unspecified: Secondary | ICD-10-CM

## 2019-04-10 DIAGNOSIS — R05 Cough: Secondary | ICD-10-CM

## 2019-04-10 MED ORDER — BENZONATATE 100 MG PO CAPS: 100.0000 mg | ORAL_CAPSULE | Freq: Three times a day (TID) | ORAL | 0 refills | Status: DC | PRN

## 2019-04-10 NOTE — Progress Notes (Signed)
Name: Cheryl Barr   MRN: [id number]    DOB: 1955/04/20   Date:04/10/2019       Progress Note  Subjective  Chief Complaint  Chief Complaint  Patient presents with  . Fatigue    She feels really tired. She has no energy. Onset x 3 days. Tiredness comes and goes.  . Letter for School/Work    She would like a work excuse for today. Due to tiredness.    I connected with  [name] on 04/10/19 at  9:40 AM EST by telephone and verified that I am speaking with the correct person using two identifiers.  I discussed the limitations, risks, security and privacy concerns of performing an evaluation and management service by telephone and the availability of in person appointments. Staff also discussed with the patient that there may be a patient responsible charge related to this service. Patient Location: at home  Provider Location: [facility]   HPI  COVID-19: symptoms started on Feb 10 th, she went to work and had symptoms of an URI, she took a nap and woke up sweaty and had a cough. She felt very tired and decided to go to [facility]. She had a fever 100.7. She tested positive for COVID-19 , CXR showed some hazy airspace opacities in the left upper lobe and right lower lung. She was sent home since she was clinically well and pulse ox of 98 %. She was sent hom on tAzithromycin , prednisone taper, tessalon perles and ventolin, she also went to [facility] on 02/12 and was given an outpatient infusion of Bamlanivimab 700 mg. She had one day of diarrhea but that has resolved. Appetite has been normal, no lack of taste of smell or taste. She states she is feeling much better but she has some fatigue when she is moving around. 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     Patient Active Problem List   Diagnosis Date Noted  . 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 Incomplete rotator cuff tear or rupture of left shoulder, not specified as traumatic 07/26/2016  . Thoracic aortic atherosclerosis ([place]) 01/12/2016  . 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 B12 deficiency 01/11/2016  . Hernia of abdominal cavity 09/15/2014  . Impaired renal function 08/10/2014  . 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 Dyslipidemia 08/10/2014  . Interval gout 08/10/2014  . Microalbuminuria 08/10/2014  . Osteoarthritis of left knee 08/10/2014  . Vitamin D deficiency 08/10/2014  . Cough 01/21/2014  . Intraductal papilloma of breast 06/27/2013  . Hypertension, essential, benign 09/02/2009  . ABNORMAL EKG 09/02/2009    Past Surgical History:  Procedure Laterality Date  . BREAST BIOPSY Left 05/24/2013   neg  . BREAST EXCISIONAL BIOPSY Left 06/21/2013   neg/ dr. Bary Castilla  . BREAST SURGERY Left 2015  . COLONOSCOPY  2009   1 polyp found. Dr. Roselyn Reef  . HERNIA REPAIR  81/44/8185   Ventral/umbilical hernia repair with 10 x 10 cm Atrium mesh in preperitoneal position.  . INSERTION OF MESH N/A 10/09/2014   Procedure: INSERTION OF MESH;  Surgeon: Robert Bellow, MD;  Location: [facility] ORS;  Service: General;  Laterality: N/A;  . KNEE  SURGERY Left 01/14/2009  . STRABISMUS SURGERY     Repair as a child  . TUBAL LIGATION  1990  . VENTRAL HERNIA REPAIR N/A 10/09/2014   Procedure: HERNIA REPAIR VENTRAL ADULT;  Surgeon: Robert Bellow, MD;  Location: [facility] ORS;  Service: General;  Laterality: N/A;    Family History  Problem Relation Age of Onset  . Hypertension Mother   . Diabetes Mother   . Heart attack Father   . Coronary artery disease Father   . Breast cancer Father 48  . Allergies Son        Barista  . Diabetes Sister   . Diabetes Brother   . Gout Brother     Social History   Tobacco Use  . Smoking  status: Former Smoker    Types: Cigarettes    Quit date: 10/02/1983    Years since quitting: 35.5  . Smokeless tobacco: Never Used  Substance Use Topics  . Alcohol use: No    Alcohol/week: 0.0 standard drinks    Current Outpatient Medications:  .  albuterol (VENTOLIN HFA) 108 (90 Base) MCG/ACT inhaler, Inhale 2 puffs into the lungs every 6 (six) hours as needed for wheezing or shortness of breath., Disp: 18 g, Rfl: 0 .  allopurinol (ZYLOPRIM) 100 MG tablet, Take 1 tablet (100 mg total) by mouth daily., Disp: 90 tablet, Rfl: 1 .  amLODipine-olmesartan (AZOR) 10-40 MG tablet, Take 1 tablet by mouth daily., Disp: 90 tablet, Rfl: 1 .  ammonium lactate (LAC-HYDRIN) 12 % cream, Apply topically as needed for dry skin., Disp: 385 g, Rfl: 0 .  aspirin 81 MG EC tablet, Take 81 mg by mouth daily.  , Disp: , Rfl:  .  atorvastatin (LIPITOR) 40 MG tablet, Take 1 tablet (40 mg total) by mouth daily., Disp: 90 tablet, Rfl: 1 .  benzonatate (TESSALON PERLES) 100 MG capsule, Take 1 capsule (100 mg total) by mouth 3 (three) times daily as needed., Disp: 30 capsule, Rfl: 0 .  budesonide-formoterol (SYMBICORT) 160-4.5 MCG/ACT inhaler, Inhale 2 puffs into the lungs 2 (two) times daily., Disp: 1 Inhaler, Rfl: 2 .  carvedilol (COREG) 12.5 MG tablet, Take 1 tablet (12.5 mg total) by mouth 2 (two) times daily with a meal., Disp: 180 tablet, Rfl: 0 .  Cyanocobalamin 500 MCG SUBL, Place 1 tablet (500 mcg total) under the tongue daily., Disp: 90 tablet, Rfl: 1 .  fluticasone (FLONASE) 50 MCG/ACT nasal spray, Place 2 sprays into both nostrils daily., Disp: 16 g, Rfl: 6 .  glucose blood (FREESTYLE LITE) test strip, FREESTYLE LITE TEST (In Vitro Strip)  check fsbs once a day for 0 days  Quantity: 100.00;  Refills: 5   Ordered :31-Dec-2009  Steele Sizer MD;  Dani Gobble Active Comments: DX: 250.00, Disp: , Rfl:  .  LANCETS ULTRA THIN 30G MISC, , Disp: , Rfl:  .  metFORMIN (GLUCOPHAGE-XR) 750 MG 24 hr tablet, Take 1  tablet (750 mg total) by mouth daily with breakfast., Disp: 90 tablet, Rfl: 1 .  ondansetron (ZOFRAN ODT) 4 MG disintegrating tablet, Take 1 tablet (4 mg total) by mouth every 8 (eight) hours as needed., Disp: 20 tablet, Rfl: 0 .  Vitamin D, Ergocalciferol, (DRISDOL) 1.25 MG (50000 UT) CAPS capsule, Take 1 capsule (50,000 Units total) by mouth every 7 (seven) days., Disp: 12 capsule, Rfl: 0  No Known Allergies  I personally reviewed active problem list, medication list, allergies, family history, social history, health maintenance with the patient/caregiver today.  ROS  Ten systems reviewed and is negative except as mentioned in HPI   Objective  Virtual encounter, vitals not obtained.  There is no height or weight on file to calculate BMI.  Physical Exam  Awake, alert and oriented   PHQ2/9: Depression screen [facility] 2/9 04/10/2019 02/19/2019 11/27/2018 11/15/2018 08/14/2018  Decreased Interest 0 0 0 0 0  Down, Depressed, Hopeless 0 0 0 0 0  PHQ - 2 Score 0 0 0 0 0  Altered sleeping 0 0 - 0 0  Tired, decreased energy 0 0 - 0 0  Change in appetite 0 0 - 0 0  Feeling bad or failure about yourself  0 0 - 0 0  Trouble concentrating 0 0 - 0 0  Moving slowly or fidgety/restless 0 0 - 0 0  Suicidal thoughts 0 0 - 0 0  PHQ-9 Score 0 0 - 0 0  Difficult doing work/chores - Not difficult at all - Not difficult at all -  Some recent data might be hidden   PHQ-2/9 Result is negative.    Fall Risk: Fall Risk  04/10/2019 02/19/2019 11/27/2018 11/15/2018 08/14/2018  Falls in the past year? 0 0 0 0 0  Number falls in past yr: 0 0 - 0 0  Injury with Fall? 0 0 - 0 0  Follow up - Falls evaluation completed Falls evaluation completed - -    Assessment & Plan  1. History of COVID-19  Stay out of work until Sunday, may resume on Monday no restrictions.   2. Cough  - benzonatate (TESSALON PERLES) 100 MG capsule; Take 1 capsule (100 mg total) by mouth 3 (three) times daily as needed.  Dispense: 30  capsule; Refill: 0  I discussed the assessment and treatment plan with the patient. The patient was provided an opportunity to ask questions and all were answered. The patient agreed with the plan and demonstrated an understanding of the instructions.   The patient was advised to call back or seek an in-person evaluation if the symptoms worsen or if the condition fails to improve as anticipated.  I provided 15 minutes of non-face-to-face time during this encounter.  Loistine Chance, MD

## 2019-05-16 ENCOUNTER — Other Ambulatory Visit: Payer: Self-pay | Admitting: Nephrology

## 2019-05-16 DIAGNOSIS — R808 Other proteinuria: Secondary | ICD-10-CM

## 2019-05-17 ENCOUNTER — Other Ambulatory Visit: Payer: Self-pay | Admitting: Family Medicine

## 2019-05-17 DIAGNOSIS — E785 Hyperlipidemia, unspecified: Secondary | ICD-10-CM

## 2019-05-20 ENCOUNTER — Other Ambulatory Visit: Payer: Self-pay | Admitting: Physician Assistant

## 2019-05-20 ENCOUNTER — Other Ambulatory Visit: Payer: Self-pay

## 2019-05-20 DIAGNOSIS — I10 Essential (primary) hypertension: Secondary | ICD-10-CM

## 2019-05-20 NOTE — Telephone Encounter (Signed)
Hypertension medication request:  Carvedilol to [facility].   Last office visit pertaining to hypertension: 03/27/2019   BP Readings from Last 3 Encounters:  03/27/19 (!) 170/79  02/19/19 (!) 142/82  11/27/18 130/87    Lab Results  Component Value Date   CREATININE 1.71 (H) 03/27/2019   BUN 39 (H) 03/27/2019   NA 141 03/27/2019   K 3.1 (L) 03/27/2019   CL 105 03/27/2019   CO2 26 03/27/2019     Follow up on 05/21/2019

## 2019-05-21 ENCOUNTER — Other Ambulatory Visit: Payer: Self-pay | Admitting: Student

## 2019-05-21 ENCOUNTER — Ambulatory Visit (INDEPENDENT_AMBULATORY_CARE_PROVIDER_SITE_OTHER): Payer: Commercial Managed Care - PPO | Admitting: Family Medicine

## 2019-05-21 ENCOUNTER — Encounter: Payer: Self-pay | Admitting: Family Medicine

## 2019-05-21 DIAGNOSIS — E538 Deficiency of other specified B group vitamins: Secondary | ICD-10-CM

## 2019-05-21 DIAGNOSIS — N1832 Chronic kidney disease, stage 3b: Secondary | ICD-10-CM | POA: Diagnosis not present

## 2019-05-21 DIAGNOSIS — E785 Hyperlipidemia, unspecified: Secondary | ICD-10-CM | POA: Diagnosis not present

## 2019-05-21 DIAGNOSIS — E1129 Type 2 diabetes mellitus with other diabetic kidney complication: Secondary | ICD-10-CM

## 2019-05-21 DIAGNOSIS — J4521 Mild intermittent asthma with (acute) exacerbation: Secondary | ICD-10-CM

## 2019-05-21 DIAGNOSIS — I700 Atherosclerosis of aorta: Secondary | ICD-10-CM

## 2019-05-21 DIAGNOSIS — I10 Essential (primary) hypertension: Secondary | ICD-10-CM

## 2019-05-21 DIAGNOSIS — E559 Vitamin D deficiency, unspecified: Secondary | ICD-10-CM

## 2019-05-21 DIAGNOSIS — R809 Proteinuria, unspecified: Secondary | ICD-10-CM

## 2019-05-21 MED ORDER — VITAMIN D (ERGOCALCIFEROL) 1.25 MG (50000 UNIT) PO CAPS: 50000.0000 [IU] | ORAL_CAPSULE | ORAL | 1 refills | Status: DC

## 2019-05-21 MED ORDER — BUDESONIDE-FORMOTEROL FUMARATE 160-4.5 MCG/ACT IN AERO: 2.0000 | INHALATION_SPRAY | Freq: Two times a day (BID) | RESPIRATORY_TRACT | 2 refills | Status: DC

## 2019-05-21 MED ORDER — SITAGLIPTIN PHOSPHATE 25 MG PO TABS: 25.0000 mg | ORAL_TABLET | Freq: Every day | ORAL | 2 refills | Status: DC

## 2019-05-21 MED ORDER — CARVEDILOL 12.5 MG PO TABS: 12.5000 mg | ORAL_TABLET | Freq: Two times a day (BID) | ORAL | 0 refills | Status: DC

## 2019-05-21 NOTE — Progress Notes (Signed)
Name: Cheryl Barr   MRN: [id number]    DOB: Dec 21, 1955   Date:05/21/2019       Progress Note  Subjective  Chief Complaint  Chief Complaint  Patient presents with  . Diabetes  . Hypertension  . Hyperlipidemia    I connected with  [name] on 05/21/19 at  8:20 AM EDT by telephone and verified that I am speaking with the correct person using two identifiers.  I discussed the limitations, risks, security and privacy concerns of performing an evaluation and management service by telephone and the availability of in person appointments. Staff also discussed with the patient that there may be a patient responsible charge related to this service. Patient Location: at home  Provider Location: [facility]   HPI  HTN: compliant with medication, Azor,and carvedilol. Shedenies side effects, no chest pain, palpitation but has some SOB with activity  Hyperlipidemia: LDL at goal done 11/2018, on Atorvastatin and denies myalgias . Continue medication   DMII with microalbuminuria: urine micro was 100 in Dec 2016  Nov 2017 and 01/2019Having proteinuria again and will have a kidney biopsy tomorrow . She is onaspirin,  she has symptoms of meralgia paresthetica on right side for a long time, she was on Lyrica and stopped taking it again. She denies polyphagia or polydipsia , she has nocturia. Last A1C was  6.3%. We will stop Metformin because of drop in kidney function and start low dose Losartan, continue healthy diet   Gout: . She is back on Allopurinol now . Doing well at this time. No recent episodes. Last uric acid 6.2.Off HCTZ. Doing well   Nocturia: she does not void frequently during the day, drinks fluids all day without problems, however wakes up multiple times to void during the night, going on for years. She snores , she feels tired when she wakes up in the morning, but denies morning headaches. Discussed sleep study to rule out OSA . Her ESS was 1 and she will not qualify for the  test, we will try avoiding fluids at night and we added Detrol back in Summer 2020 but she stopped on her own because it was not improving symptoms. She is not able to stop drinking after 6 pm   Cough:seen by Dr. 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 Candiss Norse, her kidney function dropped when she had COVID-19 back in 03/2019, she went back to nephrologist and GFR is in the 20's, also has microalbuminuria and is going tomorrow for a kidney biopsy She has normal urine output.   Patient Active Problem List   Diagnosis Date Noted  . COVID-19 virus detected 03/29/2019  . 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 Incomplete rotator cuff tear or rupture of left shoulder, not specified as traumatic 07/26/2016  . Thoracic aortic atherosclerosis ([place]) 01/12/2016  . 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 B12 deficiency 01/11/2016  . Hernia of abdominal cavity  09/15/2014  . Impaired renal function 08/10/2014  . 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 Dyslipidemia 08/10/2014  . Interval gout 08/10/2014  . Microalbuminuria 08/10/2014  . Osteoarthritis of left knee 08/10/2014  . Vitamin D deficiency 08/10/2014  . Cough 01/21/2014  . Intraductal papilloma of breast 06/27/2013  . Hypertension, essential, benign 09/02/2009  . ABNORMAL EKG 09/02/2009    Past Surgical History:  Procedure Laterality  Date  . BREAST BIOPSY Left 05/24/2013   neg  . BREAST EXCISIONAL BIOPSY Left 06/21/2013   neg/ dr. Bary Castilla  . BREAST SURGERY Left 2015  . COLONOSCOPY  2009   1 polyp found. Dr. Roselyn Reef  . HERNIA REPAIR  29/51/8841   Ventral/umbilical hernia repair with 10 x 10 cm Atrium mesh in preperitoneal position.  . INSERTION OF MESH N/A 10/09/2014   Procedure: INSERTION OF MESH;  Surgeon: Robert Bellow, MD;  Location: [facility] ORS;  Service: General;  Laterality: N/A;  . KNEE SURGERY Left 01/14/2009  . STRABISMUS SURGERY     Repair as a child  . TUBAL LIGATION  1990  . VENTRAL HERNIA REPAIR N/A 10/09/2014   Procedure: HERNIA REPAIR VENTRAL ADULT;  Surgeon: Robert Bellow, MD;  Location: [facility] ORS;  Service: General;  Laterality: N/A;    Family History  Problem Relation Age of Onset  . Hypertension Mother   . Diabetes Mother   . Heart attack Father   . Coronary artery disease Father   . Breast cancer Father 76  . Allergies Son        Barista  . Diabetes Sister   . Diabetes Brother   . Gout Brother     Social History   Socioeconomic History  . Marital status: Widowed    Spouse name: Not on file  . Number of children: 3  . Years of education: 12th Grade  . Highest education level: Not on file  Occupational History    Employer: [employer]  Tobacco Use  . Smoking status: Former Smoker    Types: Cigarettes    Quit date: 10/02/1983    Years since quitting: 35.6  . Smokeless tobacco: Never Used  Substance and Sexual Activity  . Alcohol use: No    Alcohol/week: 0.0 standard drinks  . Drug use: No  . Sexual activity: Yes    Partners: Male    Birth control/protection: None    Comment: declined condoms   Other Topics Concern  . Not on file  Social History Narrative   Widowed.    No regular exercise.   Social Determinants of Health   Financial Resource Strain:   . Difficulty of Paying Living Expenses:   Food Insecurity:   . Worried About Charity fundraiser  in the Last Year:   . Arboriculturist in the Last Year:   Transportation Needs: No Transportation Needs  . Lack of Transportation (Medical): No  . Lack of Transportation (Non-Medical): No  Physical Activity: Insufficiently Active  . Days of Exercise per Week: 3 days  . Minutes of Exercise per Session: 10 min  Stress: No Stress Concern Present  . Feeling of Stress : Not at all  Social Connections: Not Isolated  . Frequency of Communication with Friends and Family: More than three times a week  . Frequency of Social Gatherings with Friends and Family: More than three times a week  . Attends Religious Services: More than 4 times per year  . Active Member  of Clubs or Organizations: Yes  . Attends Archivist Meetings: More than 4 times per year  . Marital Status: Living with partner  Intimate Partner Violence: Not At Risk  . Fear of Current or Ex-Partner: No  . Emotionally Abused: No  . Physically Abused: No  . Sexually Abused: No     Current Outpatient Medications:  .  albuterol (VENTOLIN HFA) 108 (90 Base) MCG/ACT inhaler, Inhale 2 puffs into the lungs every 6 (six) hours as needed for wheezing or shortness of breath., Disp: 18 g, Rfl: 0 .  allopurinol (ZYLOPRIM) 100 MG tablet, Take 1 tablet (100 mg total) by mouth daily., Disp: 90 tablet, Rfl: 1 .  amLODipine-olmesartan (AZOR) 10-40 MG tablet, Take 1 tablet by mouth daily., Disp: 90 tablet, Rfl: 1 .  ammonium lactate (LAC-HYDRIN) 12 % cream, Apply topically as needed for dry skin., Disp: 385 g, Rfl: 0 .  atorvastatin (LIPITOR) 40 MG tablet, Take 1 tablet (40 mg total) by mouth daily., Disp: 90 tablet, Rfl: 1 .  budesonide-formoterol (SYMBICORT) 160-4.5 MCG/ACT inhaler, Inhale 2 puffs into the lungs 2 (two) times daily., Disp: 1 Inhaler, Rfl: 2 .  carvedilol (COREG) 12.5 MG tablet, Take 1 tablet (12.5 mg total) by mouth 2 (two) times daily with a meal., Disp: 180 tablet, Rfl: 0 .  Cyanocobalamin 500 MCG SUBL, Place 1 tablet  (500 mcg total) under the tongue daily., Disp: 90 tablet, Rfl: 1 .  glucose blood (FREESTYLE LITE) test strip, FREESTYLE LITE TEST (In Vitro Strip)  check fsbs once a day for 0 days  Quantity: 100.00;  Refills: 5   Ordered :31-Dec-2009  Steele Sizer MD;  Started 02-Jan-2009 Active Comments: DX: 250.00, Disp: , Rfl:  .  metFORMIN (GLUCOPHAGE-XR) 750 MG 24 hr tablet, Take 1 tablet (750 mg total) by mouth daily with breakfast., Disp: 90 tablet, Rfl: 1 .  amLODIPine-Valsartan-HCTZ 10-160-25 MG TABS, Take by mouth., Disp: , Rfl:  .  aspirin 81 MG EC tablet, Take 81 mg by mouth daily.  , Disp: , Rfl:  .  benzonatate (TESSALON PERLES) 100 MG capsule, Take 1 capsule (100 mg total) by mouth 3 (three) times daily as needed. (Patient not taking: Reported on 05/21/2019), Disp: 30 capsule, Rfl: 0 .  fluticasone (FLONASE) 50 MCG/ACT nasal spray, Place 2 sprays into both nostrils daily. (Patient not taking: Reported on 05/21/2019), Disp: 16 g, Rfl: 6 .  LANCETS ULTRA THIN 30G MISC, , Disp: , Rfl:  .  ondansetron (ZOFRAN ODT) 4 MG disintegrating tablet, Take 1 tablet (4 mg total) by mouth every 8 (eight) hours as needed. (Patient not taking: Reported on 05/21/2019), Disp: 20 tablet, Rfl: 0 .  Vitamin D, Ergocalciferol, (DRISDOL) 1.25 MG (50000 UT) CAPS capsule, Take 1 capsule (50,000 Units total) by mouth every 7 (seven) days. (Patient not taking: Reported on 05/21/2019), Disp: 12 capsule, Rfl: 0  No Known Allergies  I personally reviewed active problem list, medication list, allergies, family history, social history, health maintenance with the patient/caregiver today.   ROS  Ten systems reviewed and is negative except as mentioned in HPI   Objective  Virtual encounter, vitals not obtained.  There is no height or weight on file to calculate BMI.  Physical Exam  Awake, alert and oriented  PHQ2/9: Depression screen [facility] 2/9 05/21/2019 04/10/2019 02/19/2019 11/27/2018 11/15/2018  Decreased Interest 0 0 0 0 0    Down, Depressed, Hopeless 0 0 0 0 0  PHQ - 2 Score 0 0 0 0 0  Altered sleeping 0 0 0 - 0  Tired, decreased energy 0 0 0 - 0  Change in appetite 0 0 0 - 0  Feeling bad or failure about yourself  0 0 0 - 0  Trouble concentrating 0 0 0 - 0  Moving slowly or fidgety/restless 0 0 0 - 0  Suicidal thoughts 0 0 0 - 0  PHQ-9 Score 0 0 0 - 0  Difficult doing work/chores - - Not difficult at all - Not difficult at all  Some recent data might be hidden   PHQ-2/9 Result is negative.    Fall Risk: Fall Risk  05/21/2019 04/10/2019 02/19/2019 11/27/2018 11/15/2018  Falls in the past year? 0 0 0 0 0  Number falls in past yr: 0 0 0 - 0  Injury with Fall? 0 0 0 - 0  Follow up - - Falls evaluation completed Falls evaluation completed -    Assessment & Plan  1. Dyslipidemia   2. Controlled type 2 diabetes mellitus with microalbuminuria, without long-term current use of insulin (HCC)  - sitaGLIPtin (JANUVIA) 25 MG tablet; Take 1 tablet (25 mg total) by mouth daily. In place of Metformin  Dispense: 30 tablet; Refill: 2  3. Stage 3b chronic kidney disease  Keep follow up with Dr. Candiss Norse  4. Hypertension, essential, benign   5. Thoracic aortic atherosclerosis (HCC)  Continue statin therapy   6. B12 deficiency   7. Vitamin D deficiency  - Vitamin D, Ergocalciferol, (DRISDOL) 1.25 MG (50000 UNIT) CAPS capsule; Take 1 capsule (50,000 Units total) by mouth every 7 (seven) days.  Dispense: 12 capsule; Refill: 1  8. Mild intermittent reactive airway disease with acute exacerbation  - budesonide-formoterol (SYMBICORT) 160-4.5 MCG/ACT inhaler; Inhale 2 puffs into the lungs 2 (two) times daily.  Dispense: 1 Inhaler; Refill: 2  I discussed the assessment and treatment plan with the patient. The patient was provided an opportunity to ask questions and all were answered. The patient agreed with the plan and demonstrated an understanding of the instructions.   The patient was advised to call back or  seek an in-person evaluation if the symptoms worsen or if the condition fails to improve as anticipated.  I provided 25  minutes of non-face-to-face time during this encounter.  Loistine Chance, MD

## 2019-05-22 ENCOUNTER — Other Ambulatory Visit: Payer: Self-pay

## 2019-05-22 ENCOUNTER — Other Ambulatory Visit: Payer: Self-pay | Admitting: Nephrology

## 2019-05-22 ENCOUNTER — Ambulatory Visit
Admission: RE | Admit: 2019-05-22 | Discharge: 2019-05-22 | Disposition: A | Discharge status: Home / Self Care | Payer: Commercial Managed Care - PPO | Source: Ambulatory Visit | Attending: Nephrology | Admitting: Nephrology

## 2019-05-22 DIAGNOSIS — R808 Other proteinuria: Secondary | ICD-10-CM

## 2019-05-22 MED ORDER — SODIUM CHLORIDE 0.9 % IV SOLN: INTRAVENOUS | Status: DC

## 2019-05-22 NOTE — OR Nursing (Signed)
Pt ate 2 Nabs at 0500, she will return for US biopsy of Kidney on Friday. Dr Pascal Lux discussed with pt.

## 2019-05-23 ENCOUNTER — Other Ambulatory Visit: Payer: Self-pay | Admitting: Family Medicine

## 2019-05-23 ENCOUNTER — Other Ambulatory Visit: Payer: Self-pay | Admitting: Radiology

## 2019-05-23 DIAGNOSIS — E785 Hyperlipidemia, unspecified: Secondary | ICD-10-CM

## 2019-05-23 NOTE — Progress Notes (Signed)
Patient on schedule for Renal biopsy 4/09, Patient made aware again not to Eat nor po's except meds with sip of water prior to procedure. Stated understanding. To be here @ 0730

## 2019-05-23 NOTE — Telephone Encounter (Signed)
Requested Prescriptions  Pending Prescriptions Disp Refills  . atorvastatin (LIPITOR) 40 MG tablet [Pharmacy Med Name: Atorvastatin Calcium 40 MG Oral Tablet] 90 tablet 0    Sig: Take 1 tablet by mouth once daily     Cardiovascular:  Antilipid - Statins Failed - 05/23/2019  7:08 AM      Failed - HDL in normal range and within 360 days    HDL  Date Value Ref Range Status  11/15/2018 44 (L) > OR = 50 mg/dL Final         Failed - Triglycerides in normal range and within 360 days    Triglycerides  Date Value Ref Range Status  11/15/2018 195 (H) <150 mg/dL Final         Passed - Total Cholesterol in normal range and within 360 days    Cholesterol  Date Value Ref Range Status  11/15/2018 144 <200 mg/dL Final         Passed - LDL in normal range and within 360 days    LDL Cholesterol (Calc)  Date Value Ref Range Status  11/15/2018 71 mg/dL (calc) Final    Comment:    Reference range: <100 . Desirable range <100 mg/dL for primary prevention;   <70 mg/dL for patients with CHD or diabetic patients  with > or = 2 CHD risk factors. Marland Kitchen LDL-C is now calculated using the Martin-Hopkins  calculation, which is a validated novel method providing  better accuracy than the Friedewald equation in the  estimation of LDL-C.  Cresenciano Genre et al. Annamaria Helling. 0175;102(58): 2061-2068  (http://education.[employer].com/faq/FAQ164)          Passed - Patient is not pregnant      Passed - Valid encounter within last 12 months    Recent Outpatient Visits          2 days ago Dyslipidemia   [facility] Steele Sizer, MD   1 month ago History of COVID-19   [facility], Drue Stager, MD   3 months ago Controlled type 2 diabetes mellitus with microalbuminuria, without long-term current use of insulin [facility])   [facility] Steele Sizer, MD   6 months ago Controlled type 2 diabetes mellitus with microalbuminuria, without long-term current use  of insulin [facility])   [facility] Steele Sizer, MD   9 months ago Controlled type 2 diabetes mellitus with microalbuminuria, without long-term current use of insulin [facility])   [facility] Steele Sizer, MD      Future Appointments            In 1 week Michel Bickers, MD [facility]   In 3 months Steele Sizer, MD [facility], [facility]

## 2019-05-23 NOTE — Progress Notes (Signed)
Patient on schedule for Kidney biopsy, 05/22/2019, made aware to be NPO after MN, as well as be here with driver for home after procedure. Stated understanding.

## 2019-05-24 ENCOUNTER — Ambulatory Visit
Admission: RE | Admit: 2019-05-24 | Discharge: 2019-05-24 | Disposition: A | Discharge status: Home / Self Care | Payer: Commercial Managed Care - PPO | Source: Ambulatory Visit | Attending: Nephrology | Admitting: Nephrology

## 2019-05-24 ENCOUNTER — Other Ambulatory Visit: Payer: Self-pay

## 2019-05-24 DIAGNOSIS — E785 Hyperlipidemia, unspecified: Secondary | ICD-10-CM | POA: Insufficient documentation

## 2019-05-24 DIAGNOSIS — R808 Other proteinuria: Secondary | ICD-10-CM

## 2019-05-24 DIAGNOSIS — Z79899 Other long term (current) drug therapy: Secondary | ICD-10-CM | POA: Diagnosis not present

## 2019-05-24 DIAGNOSIS — M109 Gout, unspecified: Secondary | ICD-10-CM | POA: Insufficient documentation

## 2019-05-24 DIAGNOSIS — Z8249 Family history of ischemic heart disease and other diseases of the circulatory system: Secondary | ICD-10-CM | POA: Diagnosis not present

## 2019-05-24 DIAGNOSIS — M1990 Unspecified osteoarthritis, unspecified site: Secondary | ICD-10-CM | POA: Insufficient documentation

## 2019-05-24 DIAGNOSIS — Z833 Family history of diabetes mellitus: Secondary | ICD-10-CM | POA: Insufficient documentation

## 2019-05-24 DIAGNOSIS — Z87891 Personal history of nicotine dependence: Secondary | ICD-10-CM | POA: Diagnosis not present

## 2019-05-24 DIAGNOSIS — E1122 Type 2 diabetes mellitus with diabetic chronic kidney disease: Secondary | ICD-10-CM | POA: Insufficient documentation

## 2019-05-24 DIAGNOSIS — N189 Chronic kidney disease, unspecified: Secondary | ICD-10-CM | POA: Insufficient documentation

## 2019-05-24 DIAGNOSIS — Z7982 Long term (current) use of aspirin: Secondary | ICD-10-CM | POA: Diagnosis not present

## 2019-05-24 DIAGNOSIS — Z7951 Long term (current) use of inhaled steroids: Secondary | ICD-10-CM | POA: Insufficient documentation

## 2019-05-24 DIAGNOSIS — N289 Disorder of kidney and ureter, unspecified: Secondary | ICD-10-CM | POA: Diagnosis present

## 2019-05-24 DIAGNOSIS — E559 Vitamin D deficiency, unspecified: Secondary | ICD-10-CM | POA: Diagnosis not present

## 2019-05-24 DIAGNOSIS — I129 Hypertensive chronic kidney disease with stage 1 through stage 4 chronic kidney disease, or unspecified chronic kidney disease: Secondary | ICD-10-CM | POA: Diagnosis not present

## 2019-05-24 DIAGNOSIS — Z7984 Long term (current) use of oral hypoglycemic drugs: Secondary | ICD-10-CM | POA: Diagnosis not present

## 2019-05-24 DIAGNOSIS — M797 Fibromyalgia: Secondary | ICD-10-CM | POA: Insufficient documentation

## 2019-05-24 DIAGNOSIS — Z803 Family history of malignant neoplasm of breast: Secondary | ICD-10-CM | POA: Diagnosis not present

## 2019-05-24 LAB — CBC
HCT: 31.6 % — ABNORMAL LOW (ref 36.0–46.0)
Hemoglobin: 10.1 g/dL — ABNORMAL LOW (ref 12.0–15.0)
MCH: 26 pg (ref 26.0–34.0)
MCHC: 32 g/dL (ref 30.0–36.0)
MCV: 81.2 fL (ref 80.0–100.0)
Platelets: 325 10*3/uL (ref 150–400)
RBC: 3.89 MIL/uL (ref 3.87–5.11)
RDW: 15.7 % — ABNORMAL HIGH (ref 11.5–15.5)
WBC: 6.3 10*3/uL (ref 4.0–10.5)
nRBC: 0 % (ref 0.0–0.2)

## 2019-05-24 LAB — GLUCOSE, CAPILLARY: Glucose-Capillary: 127 mg/dL — ABNORMAL HIGH (ref 70–99)

## 2019-05-24 LAB — APTT: aPTT: 30 seconds (ref 24–36)

## 2019-05-24 LAB — PROTIME-INR
INR: 0.9 (ref 0.8–1.2)
Prothrombin Time: 11.9 seconds (ref 11.4–15.2)

## 2019-05-24 MED ORDER — MIDAZOLAM HCL 5 MG/5ML IJ SOLN
INTRAMUSCULAR | Status: AC | PRN
  Administered 2019-05-24: 1 mg via INTRAVENOUS

## 2019-05-24 MED ORDER — SODIUM CHLORIDE 0.9 % IV SOLN: INTRAVENOUS | Status: DC

## 2019-05-24 MED ORDER — FENTANYL CITRATE (PF) 100 MCG/2ML IJ SOLN
INTRAMUSCULAR | Status: AC
  Filled 2019-05-24: qty 2

## 2019-05-24 MED ORDER — FENTANYL CITRATE (PF) 100 MCG/2ML IJ SOLN
INTRAMUSCULAR | Status: AC | PRN
  Administered 2019-05-24: 50 ug via INTRAVENOUS

## 2019-05-24 MED ORDER — MIDAZOLAM HCL 5 MG/5ML IJ SOLN
INTRAMUSCULAR | Status: AC
  Filled 2019-05-24: qty 5

## 2019-05-24 NOTE — H&P (Signed)
Chief Complaint: Patient was seen in consultation today for renal biopsy at the request of Singh,Harmeet  Referring Physician(s): Singh,Harmeet  Patient Status: [facility] - Out-pt  History of Present Illness: Cheryl Barr is a 64 y.o. female with history of renal insufficiency and proteinuria here for random renal biopsy for further evaluation. Currently asymptomatic.  Past Medical History:  Diagnosis Date  . Arthritis   . Colon polyp 2009  . Depression   . Diabetes mellitus    Type 2  . Fibromyalgia   . Gout   . Hyperlipidemia   . Hypertension   . Insomnia   . Menopause   . Vitamin D deficiency     Past Surgical History:  Procedure Laterality Date  . BREAST BIOPSY Left 05/24/2013   neg  . BREAST EXCISIONAL BIOPSY Left 06/21/2013   neg/ dr. Bary Castilla  . BREAST SURGERY Left 2015  . COLONOSCOPY  2009   1 polyp found. Dr. Roselyn Reef  . HERNIA REPAIR  84/13/2440   Ventral/umbilical hernia repair with 10 x 10 cm Atrium mesh in preperitoneal position.  . INSERTION OF MESH N/A 10/09/2014   Procedure: INSERTION OF MESH;  Surgeon: Robert Bellow, MD;  Location: [facility] ORS;  Service: General;  Laterality: N/A;  . KNEE SURGERY Left 01/14/2009  . STRABISMUS SURGERY     Repair as a child  . TUBAL LIGATION  1990  . VENTRAL HERNIA REPAIR N/A 10/09/2014   Procedure: HERNIA REPAIR VENTRAL ADULT;  Surgeon: Robert Bellow, MD;  Location: [facility] ORS;  Service: General;  Laterality: N/A;    Allergies: Patient has no known allergies.  Medications: Prior to Admission medications   Medication Sig Start Date End Date Taking? Authorizing Provider  albuterol (VENTOLIN HFA) 108 (90 Base) MCG/ACT inhaler Inhale 2 puffs into the lungs every 6 (six) hours as needed for wheezing or shortness of breath. 03/27/19  Yes Gregor Hams, MD  allopurinol (ZYLOPRIM) 100 MG tablet Take 1 tablet (100 mg total) by mouth daily. 02/19/19  Yes Sowles, Drue Stager, MD  amLODipine-olmesartan (AZOR) 10-40 MG tablet Take  1 tablet by mouth daily. 02/19/19  Yes Sowles, Drue Stager, MD  ammonium lactate (LAC-HYDRIN) 12 % cream Apply topically as needed for dry skin. 08/23/17  Yes Steele Sizer, MD  aspirin 81 MG EC tablet Take 81 mg by mouth daily.     Yes [provider]  atorvastatin (LIPITOR) 40 MG tablet Take 1 tablet by mouth once daily 05/23/19  Yes Sowles, Drue Stager, MD  budesonide-formoterol [facility]) 160-4.5 MCG/ACT inhaler Inhale 2 puffs into the lungs 2 (two) times daily. 05/21/19  Yes Sowles, Drue Stager, MD  carvedilol (COREG) 12.5 MG tablet Take 1 tablet (12.5 mg total) by mouth 2 (two) times daily with a meal. 05/21/19  Yes Sowles, Drue Stager, MD  Cyanocobalamin 500 MCG SUBL Place 1 tablet (500 mcg total) under the tongue daily. 02/19/19  Yes Sowles, Drue Stager, MD  fluticasone (FLONASE) 50 MCG/ACT nasal spray Place 2 sprays into both nostrils daily. 04/06/17  Yes Raelyn Ensign E, FNP  glucose blood (FREESTYLE LITE) test strip FREESTYLE LITE TEST (In Vitro Strip)  check fsbs once a day for 0 days  Quantity: 100.00;  Refills: 5   Ordered :31-Dec-2009  Steele Sizer MD;  Buddy Duty 02-Jan-2009 Active Comments: DX: 250.00 01/02/09  Yes [provider]  [place]    Yes [provider]  sitaGLIPtin (JANUVIA) 25 MG tablet Take 1 tablet (25 mg total) by mouth daily. In place of Metformin 05/21/19  Yes Steele Sizer, MD  Vitamin D, Ergocalciferol, (DRISDOL) 1.25 MG (50000 UNIT) CAPS capsule Take 1 capsule (50,000 Units total) by mouth every 7 (seven) days. 05/21/19  Yes Steele Sizer, MD     Family History  Problem Relation Age of Onset  . Hypertension Mother   . Diabetes Mother   . Heart attack Father   . Coronary artery disease Father   . Breast cancer Father 62  . Allergies Son        Barista  . Diabetes Sister   . Diabetes Brother   . Gout Brother     Social History   Socioeconomic History  . Marital status: Widowed    Spouse name: Not on file  . Number  of children: 3  . Years of education: 12th Grade  . Highest education level: Not on file  Occupational History    Employer: [employer]  Tobacco Use  . Smoking status: Former Smoker    Types: Cigarettes    Quit date: 10/02/1983    Years since quitting: 35.6  . Smokeless tobacco: Never Used  Substance and Sexual Activity  . Alcohol use: No    Alcohol/week: 0.0 standard drinks  . Drug use: No  . Sexual activity: Yes    Partners: Male    Birth control/protection: None    Comment: declined condoms   Other Topics Concern  . Not on file  Social History Narrative   Widowed.    No regular exercise.   Social Determinants of Health   Financial Resource Strain:   . Difficulty of Paying Living Expenses:   Food Insecurity:   . Worried About Charity fundraiser in the Last Year:   . Arboriculturist in the Last Year:   Transportation Needs: No Transportation Needs  . Lack of Transportation (Medical): No  . Lack of Transportation (Non-Medical): No  Physical Activity: Insufficiently Active  . Days of Exercise per Week: 3 days  . Minutes of Exercise per Session: 10 min  Stress: No Stress Concern Present  . Feeling of Stress : Not at all  Social Connections: Not Isolated  . Frequency of Communication with Friends and Family: More than three times a week  . Frequency of Social Gatherings with Friends and Family: More than three times a week  . Attends Religious Services: More than 4 times per year  . Active Member of Clubs or Organizations: Yes  . Attends Archivist Meetings: More than 4 times per year  . Marital Status: Living with partner    Review of Systems: A 12 point ROS discussed and pertinent positives are indicated in the HPI above.  All other systems are negative.  Review of Systems  Constitutional: Negative.   Respiratory: Negative.   Cardiovascular: Negative.   Gastrointestinal: Negative.   Genitourinary: Negative.   Musculoskeletal: Negative.     Neurological: Negative.     Vital Signs: BP 134/82   Pulse 64   Temp 99 F (37.2 C) (Oral)   Resp 16   Ht 5\' 7"  (1.702 m)   Wt 95.3 kg   SpO2 97%   BMI 32.89 kg/m   Physical Exam Vitals reviewed.  Constitutional:      General: She is not in acute distress.    Appearance: Normal appearance. She is not ill-appearing, toxic-appearing or diaphoretic.  HENT:     Head: Normocephalic and atraumatic.  Cardiovascular:     Rate and Rhythm: Normal rate and regular  rhythm.     Heart sounds: Normal heart sounds. No murmur. No friction rub. No gallop.   Pulmonary:     Effort: Pulmonary effort is normal. No respiratory distress.     Breath sounds: Normal breath sounds. No stridor. No wheezing, rhonchi or rales.  Abdominal:     General: Bowel sounds are normal. There is no distension.     Palpations: Abdomen is soft.     Tenderness: There is no abdominal tenderness. There is no guarding or rebound.  Musculoskeletal:        General: No swelling.     Cervical back: Neck supple.  Skin:    General: Skin is warm and dry.  Neurological:     General: No focal deficit present.     Mental Status: She is alert and oriented to person, place, and time.     Imaging: No results found.  Labs:  CBC: Recent Labs    06/01/18 0728 11/15/18 0000 02/19/19 0850 03/27/19 0051  WBC 7.5 5.8 6.5 6.5  HGB 11.0* 10.5* 11.1* 10.6*  HCT 34.6* 31.4* 34.8* 32.8*  PLT 297 343 365 255    COAGS: Recent Labs    05/29/18 0005  INR 1.0    BMP: Recent Labs    06/04/18 0907 11/15/18 0000 02/19/19 0850 03/27/19 0051  NA 146 143 144 141  K 3.6 3.8 4.1 3.1*  CL 105 104 105 105  CO2 30 31 31 26   GLUCOSE 121* 115* 128* 137*  BUN 32* 28* 30* 39*  CALCIUM 9.2 9.4 10.1 8.9  CREATININE 1.50* 1.61* 1.44* 1.71*  GFRNONAA 37* 34* 39* 31*  GFRAA 43* 39* 45* 36*    LIVER FUNCTION TESTS: Recent Labs    05/29/18 0005 05/29/18 0005 05/30/18 1455 11/15/18 0000 02/19/19 0850 03/27/19 0051   BILITOT 0.6   < > 0.4 0.4 0.3 0.4  AST 17   < > 14 14 13 17   ALT 18   < > 13 16 11 16   ALKPHOS 94  --   --   --   --  79  PROT 8.3*   < > 7.1 7.6 7.6 7.8  ALBUMIN 3.8  --   --   --   --  3.8   < > = values in this interval not displayed.     Assessment and Plan:  For US guided renal biopsy today. Risks and benefits of renal biopsy was discussed with the patient and/or patient's family including, but not limited to bleeding, infection, damage to adjacent structures or low yield requiring additional tests. All of the questions were answered and there is agreement to proceed. Consent signed and in chart.  Thank you for this interesting consult.  I greatly enjoyed meeting [facility] and look forward to participating in their care.  A copy of this report was sent to the requesting provider on this date.  Electronically Signed: Azzie Roup, MD 05/24/2019, 8:38 AM     I spent a total of 15 Minutes  in face to face in clinical consultation, greater than 50% of which was counseling/coordinating care for renal biopsy.

## 2019-05-24 NOTE — Discharge Instructions (Signed)
Moderate Conscious Sedation, Adult, Care After These instructions provide you with information about caring for yourself after your procedure. Your health care provider may also give you more specific instructions. Your treatment has been planned according to current medical practices, but problems sometimes occur. Call your health care provider if you have any problems or questions after your procedure. What can I expect after the procedure? After your procedure, it is common: To feel sleepy for several hours. To feel clumsy and have poor balance for several hours. To have poor judgment for several hours. To vomit if you eat too soon. Follow these instructions at home: For at least 24 hours after the procedure:  Do not: Participate in activities where you could fall or become injured. Drive. Use heavy machinery. Drink alcohol. Take sleeping pills or medicines that cause drowsiness. Make important decisions or sign legal documents. Take care of children on your own. Rest. Eating and drinking Follow the diet recommended by your health care provider. If you vomit: Drink water, juice, or soup when you can drink without vomiting. Make sure you have little or no nausea before eating solid foods. General instructions Have a responsible adult stay with you until you are awake and alert. Take over-the-counter and prescription medicines only as told by your health care provider. If you smoke, do not smoke without supervision. Keep all follow-up visits as told by your health care provider. This is important. Contact a health care provider if: You keep feeling nauseous or you keep vomiting. You feel light-headed. You develop a rash. You have a fever. Get help right away if: You have trouble breathing. This information is not intended to replace advice given to you by your health care provider. Make sure you discuss any questions you have with your health care provider. Document Revised:  01/13/2017 Document Reviewed: 05/23/2015 Elsevier Patient Education  [place].  Kidney Biopsy, Care After This sheet gives you information about how to care for yourself after your procedure. Your health care provider may also give you more specific instructions. If you have problems or questions, contact your health care provider. What can I expect after the procedure? After the procedure, it is common to have:  Pain or soreness near the biopsy site.  Pink or cloudy urine for 24 hours after the procedure. Follow these instructions at home: Activity  Return to your normal activities as told by your health care provider. Ask your health care provider what activities are safe for you.  If you were given a sedative during the procedure, it can affect you for several hours. Do not drive or operate machinery until your health care provider says that it is safe.  Do not lift anything that is heavier than 10 lb (4.5 kg), or the limit that you are told, until your health care provider says that it is safe.  Avoid activities that take a lot of effort (are strenuous) until your health care provider approves. Most people will have to wait 2 weeks before returning to activities such as exercise or sex. General instructions   Take over-the-counter and prescription medicines only as told by your health care provider.  You may eat and drink after your procedure. Follow instructions from your health care provider about eating or drinking restrictions.  Check your biopsy site every day for signs of infection. Check for: ? More redness, swelling, or pain. ? Fluid or blood. ? Warmth. ? Pus or a bad smell.  Keep all follow-up visits as told  by your health care provider. This is important. Contact a health care provider if:  You have more redness, swelling, or pain around your biopsy site.  You have fluid or blood coming from your biopsy site.  Your biopsy site feels warm to the  touch.  You have pus or a bad smell coming from your biopsy site.  You have blood in your urine more than 24 hours after your procedure.  You have a fever. Get help right away if:  Your urine is dark red or brown.  You cannot urinate.  It burns when you urinate.  You feel dizzy or light-headed.  You have severe pain in your abdomen or side. Summary  After the procedure, it is common to have pain or soreness at the biopsy site and pink or cloudy urine for the first 24 hours.  Check your biopsy site each day for signs of infection, such as more redness, swelling, or pain; fluid, blood, pus or a bad smell coming from the biopsy site; or the biopsy site feeling warm to touch.  Return to your normal activities as told by your health care provider. This information is not intended to replace advice given to you by your health care provider. Make sure you discuss any questions you have with your health care provider. Document Revised: 10/04/2018 Document Reviewed: 10/04/2018 Elsevier Patient Education  [place].

## 2019-05-24 NOTE — Procedures (Signed)
Interventional Radiology Procedure Note  Procedure: US Guided Biopsy of left kidney  Complications: None  Estimated Blood Loss: < 10 mL  Findings: 18 G core biopsy of left kidney performed under US guidance.  Three core samples obtained and sent to Pathology.  Venetia Night. Kathlene Cote, M.D Pager:  [phone]

## 2019-05-29 ENCOUNTER — Other Ambulatory Visit: Payer: Self-pay | Admitting: Family Medicine

## 2019-05-29 DIAGNOSIS — N179 Acute kidney failure, unspecified: Secondary | ICD-10-CM

## 2019-05-29 DIAGNOSIS — E876 Hypokalemia: Secondary | ICD-10-CM

## 2019-06-04 ENCOUNTER — Ambulatory Visit: Payer: Commercial Managed Care - PPO | Admitting: Internal Medicine

## 2019-06-05 ENCOUNTER — Encounter: Payer: Self-pay | Admitting: Nephrology

## 2019-06-05 LAB — SURGICAL PATHOLOGY

## 2019-06-12 DIAGNOSIS — N189 Chronic kidney disease, unspecified: Secondary | ICD-10-CM | POA: Insufficient documentation

## 2019-06-12 DIAGNOSIS — N2581 Secondary hyperparathyroidism of renal origin: Secondary | ICD-10-CM | POA: Insufficient documentation

## 2019-06-12 DIAGNOSIS — N184 Chronic kidney disease, stage 4 (severe): Secondary | ICD-10-CM | POA: Insufficient documentation

## 2019-06-12 DIAGNOSIS — N051 Unspecified nephritic syndrome with focal and segmental glomerular lesions: Secondary | ICD-10-CM | POA: Insufficient documentation

## 2019-06-12 DIAGNOSIS — D631 Anemia in chronic kidney disease: Secondary | ICD-10-CM | POA: Insufficient documentation

## 2019-06-12 DIAGNOSIS — D649 Anemia, unspecified: Secondary | ICD-10-CM | POA: Insufficient documentation

## 2019-07-11 IMAGING — CT CT ABDOMEN AND PELVIS WITH CONTRAST
2 of 5 series · 16 of 46 positions shown, 18 images · IV contrast (APPLIED)
Comparison: None.

CLINICAL DATA: Lower abdominal pain for 1 hour

EXAM:
CT ABDOMEN AND PELVIS WITH CONTRAST
TECHNIQUE: Multidetector CT imaging of the abdomen and pelvis was performed
using the standard protocol following bolus administration of
intravenous contrast.
CONTRAST:  75mL OMNIPAQUE 300

[Series 2: routine abd/pel with · axial · 0.78mm/px · z∈[-875,-425]mm · 13 of 102 slices shown, 15 images]
[im 6/102  soft-tissue]
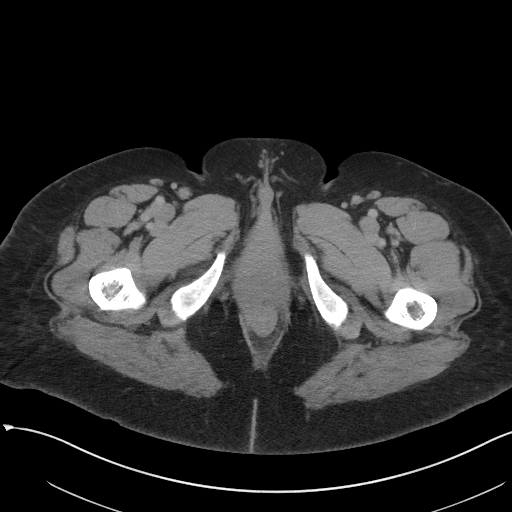
[im 6/102  bone]
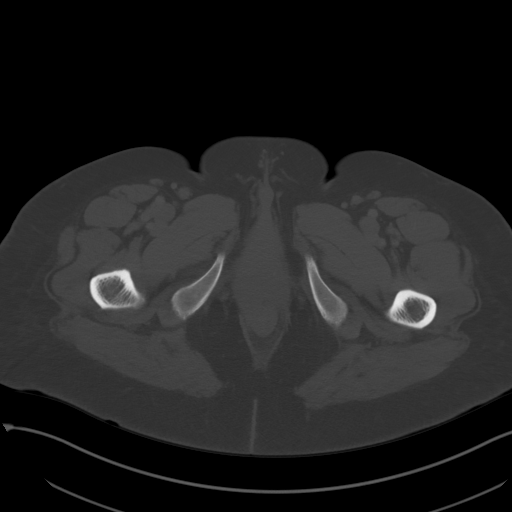
[im 16/102  soft-tissue]
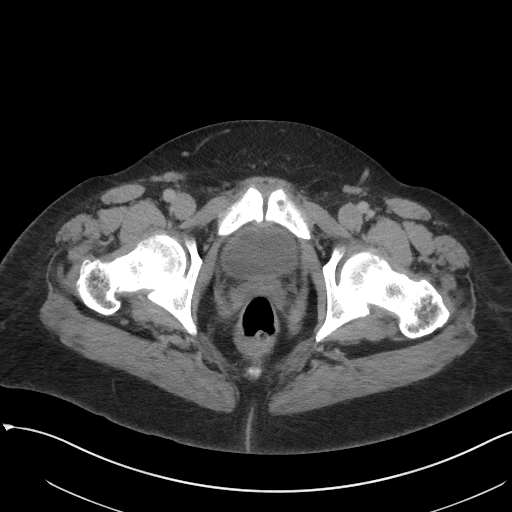
[im 22/102  soft-tissue]
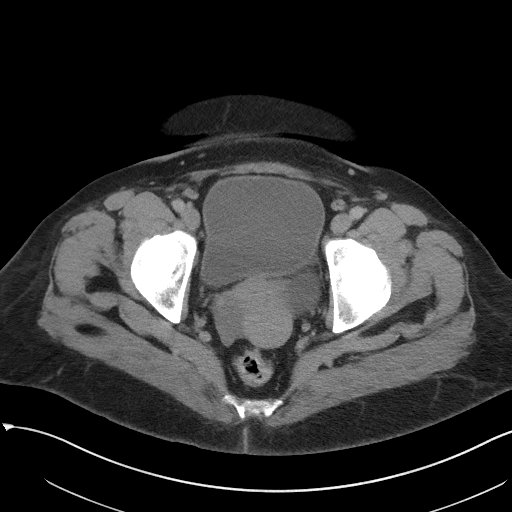
[im 27/102  soft-tissue]
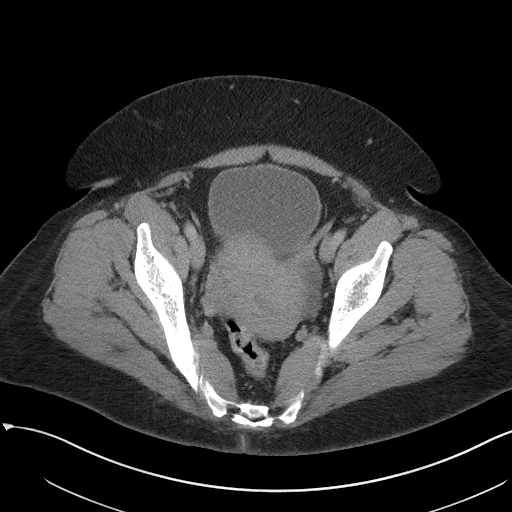
[im 38/102  soft-tissue]
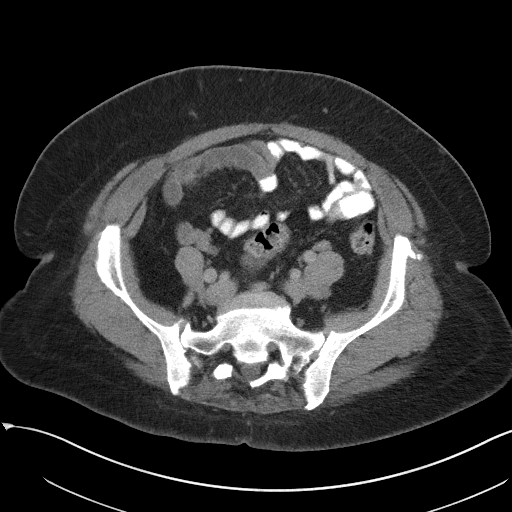
[im 43/102  soft-tissue]
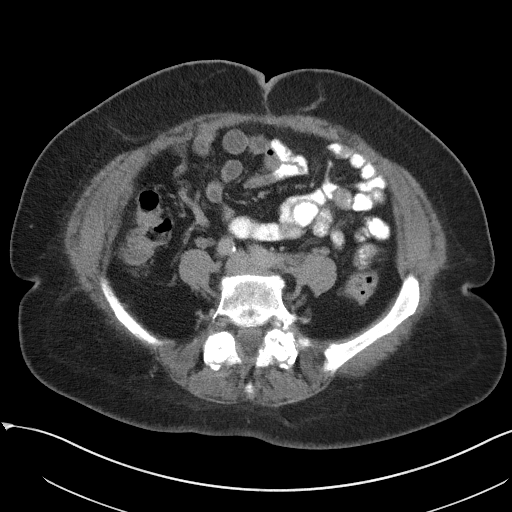
[im 54/102  soft-tissue]
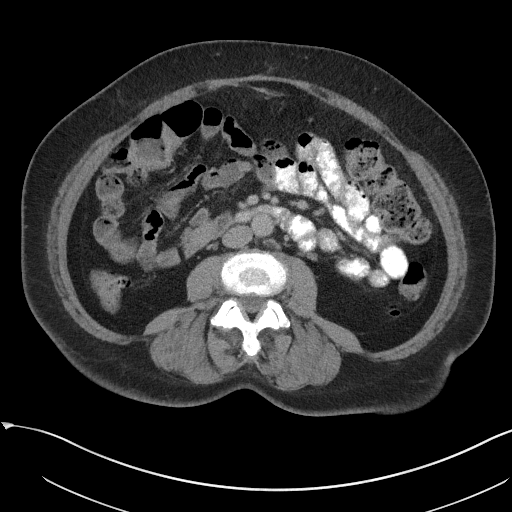
[im 59/102  soft-tissue]
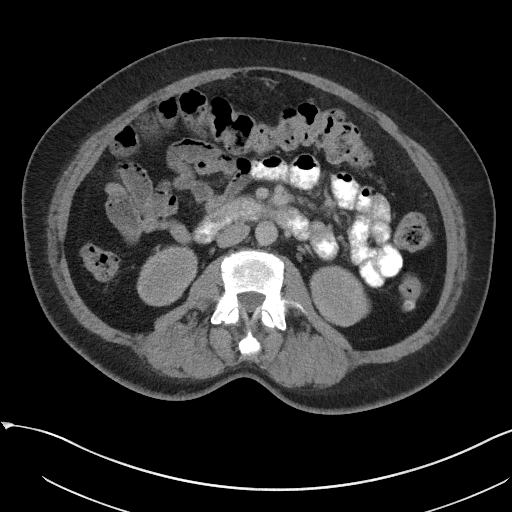
[im 64/102  soft-tissue]
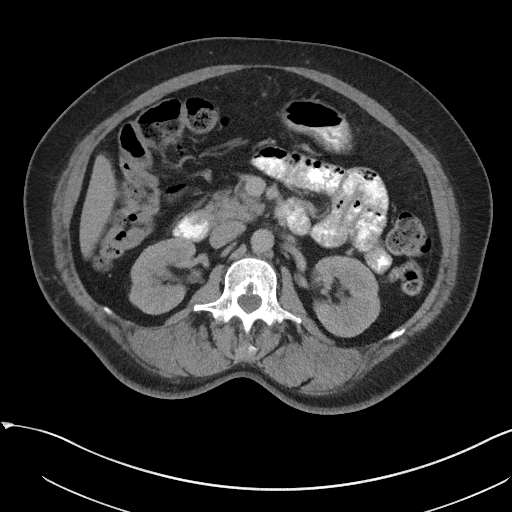
[im 64/102  bone]
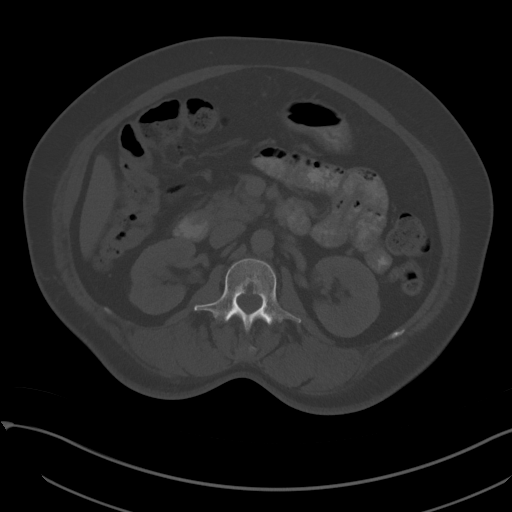
[im 75/102  soft-tissue]
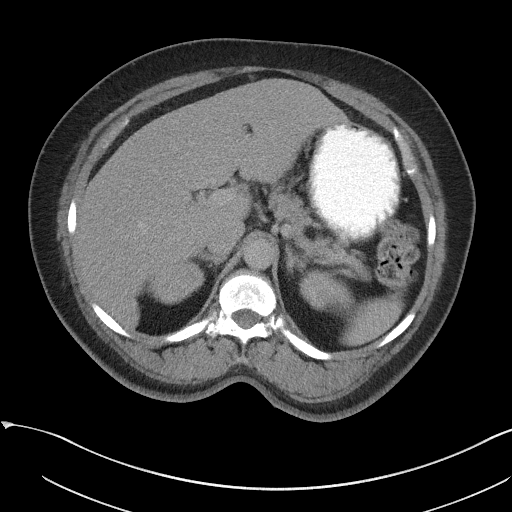
[im 80/102  soft-tissue]
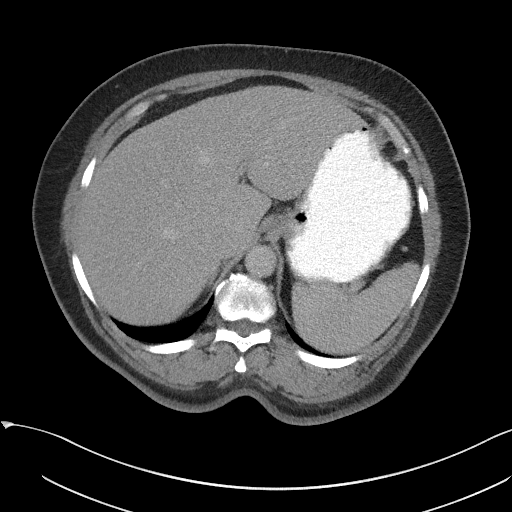
[im 86/102  soft-tissue]
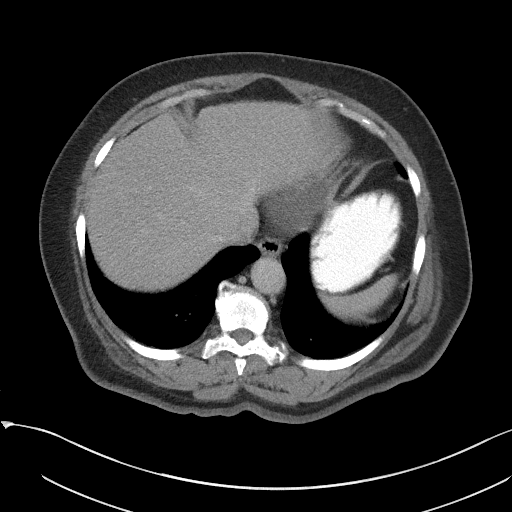
[im 96/102  soft-tissue]
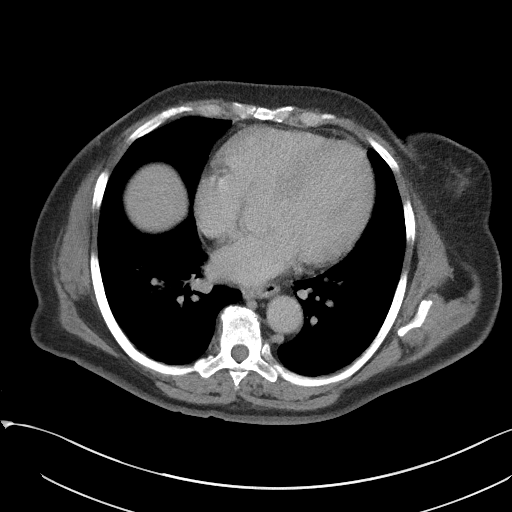

[Series 5: coronal st · coronal · 0.73mm/px · 3 of 97 slices shown]
[im 33/97  soft-tissue]
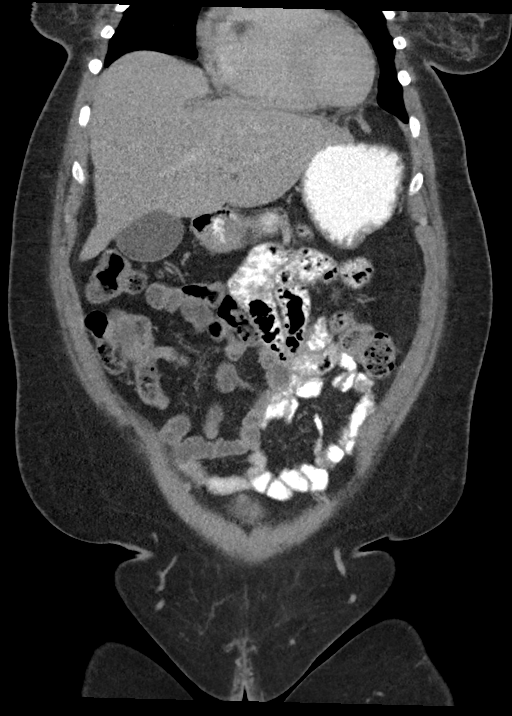
[im 43/97  soft-tissue]
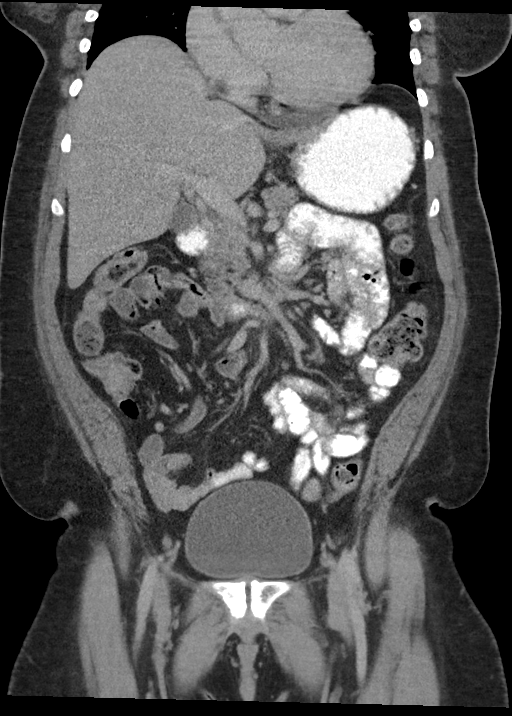
[im 54/97  soft-tissue]
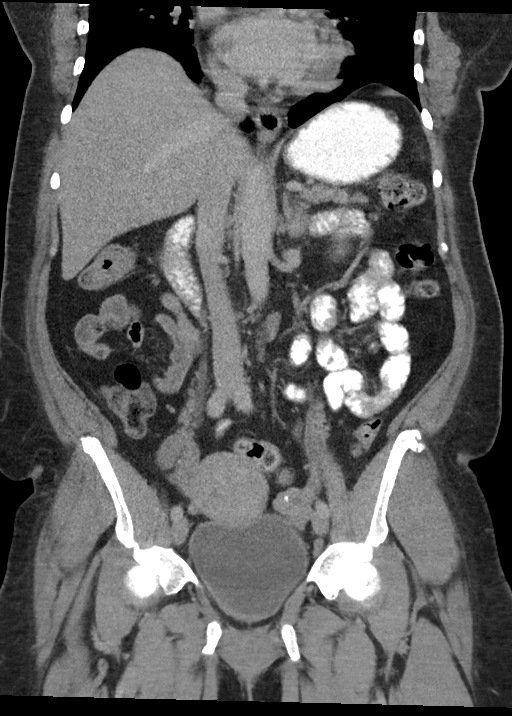

[16 of 46 positions shown; findings below may reference images not displayed]

FINDINGS: Lower chest: No acute abnormality.

Hepatobiliary: Mild fatty infiltration of the liver is noted. The
gallbladder is within normal limits.

Pancreas: Unremarkable. No pancreatic ductal dilatation or
surrounding inflammatory changes.

Spleen: Normal in size without focal abnormality.

Adrenals/Urinary Tract: Adrenal glands are within normal limits.
Kidneys are well visualized bilaterally. 2 cm cyst is noted within
the upper pole of the left kidney. No renal calculi or obstructive
changes are seen. Bladder is well distended.

Stomach/Bowel: The appendix is within normal limits. No obstructive
or inflammatory changes of large or small bowel are seen. The
stomach is within normal limits.

Vascular/Lymphatic: No significant vascular findings are present. No
enlarged abdominal or pelvic lymph nodes.

Reproductive: Uterus is bulky likely related to fibroid change.
Ovarian cystic changes are noted bilaterally. The largest of these
lies on the left measuring approximately 5.3 cm in greatest
dimension.

Other: No ascites is noted. Some minimal irregularity of the of the
anterior abdominal wall is noted just above the umbilicus concerning
for very early herniation.

Musculoskeletal: Degenerative changes of the lumbar spine are noted.
IMPRESSION: No acute abnormality identified.

Uterine fibroid change.

Irregularity of the anterior abdominal wall just above the umbilicus
concerning for herniation. Only minimal omental fat is noted within.

## 2019-08-21 ENCOUNTER — Encounter: Payer: Self-pay | Admitting: Family Medicine

## 2019-08-21 ENCOUNTER — Ambulatory Visit (INDEPENDENT_AMBULATORY_CARE_PROVIDER_SITE_OTHER): Payer: Commercial Managed Care - PPO | Admitting: Family Medicine

## 2019-08-21 ENCOUNTER — Other Ambulatory Visit: Payer: Self-pay

## 2019-08-21 VITALS — BP 120/80 | HR 90 | Temp 96.8°F | Resp 16 | Ht 67.0 in | Wt 204.6 lb

## 2019-08-21 DIAGNOSIS — E1129 Type 2 diabetes mellitus with other diabetic kidney complication: Secondary | ICD-10-CM

## 2019-08-21 DIAGNOSIS — E785 Hyperlipidemia, unspecified: Secondary | ICD-10-CM

## 2019-08-21 DIAGNOSIS — E1159 Type 2 diabetes mellitus with other circulatory complications: Secondary | ICD-10-CM

## 2019-08-21 DIAGNOSIS — G5711 Meralgia paresthetica, right lower limb: Secondary | ICD-10-CM | POA: Diagnosis not present

## 2019-08-21 DIAGNOSIS — I700 Atherosclerosis of aorta: Secondary | ICD-10-CM

## 2019-08-21 DIAGNOSIS — D631 Anemia in chronic kidney disease: Secondary | ICD-10-CM

## 2019-08-21 DIAGNOSIS — M109 Gout, unspecified: Secondary | ICD-10-CM

## 2019-08-21 DIAGNOSIS — I10 Essential (primary) hypertension: Secondary | ICD-10-CM

## 2019-08-21 DIAGNOSIS — E538 Deficiency of other specified B group vitamins: Secondary | ICD-10-CM

## 2019-08-21 DIAGNOSIS — E559 Vitamin D deficiency, unspecified: Secondary | ICD-10-CM

## 2019-08-21 DIAGNOSIS — N2581 Secondary hyperparathyroidism of renal origin: Secondary | ICD-10-CM

## 2019-08-21 DIAGNOSIS — J4520 Mild intermittent asthma, uncomplicated: Secondary | ICD-10-CM

## 2019-08-21 DIAGNOSIS — N184 Chronic kidney disease, stage 4 (severe): Secondary | ICD-10-CM

## 2019-08-21 DIAGNOSIS — R809 Proteinuria, unspecified: Secondary | ICD-10-CM

## 2019-08-21 LAB — POCT GLYCOSYLATED HEMOGLOBIN (HGB A1C): Hemoglobin A1C: 6.2 % — AB (ref 4.0–5.6)

## 2019-08-21 NOTE — Progress Notes (Signed)
Name: Cheryl Barr   MRN: [id number]    DOB: 12/30/1955   Date:08/21/2019       Progress Note  Subjective  Chief Complaint  Chief Complaint  Patient presents with  . Diabetes  . Dyslipidemia  . Hypertension    HPI  HTN: compliant with medication, Azor,and carvedilol. Shedenies side effects, no chest pain, palpitationbut has some SOB with activity that has been stable, bp is at goal   Hyperlipidemia/Atherosclerosis of aorta :LDL at goal done 11/2018, on Atorvastatin and denies myalgias. Continue medication , discussed rechecking labs but she would like to wait for now   DMII with CKI stage IV and also HTN: urine micro was 100 in Dec 2016 Nov 2017 and 01/2019Having proteinuria again , kidney biopsy showed glomerulonephritis . She is onaspirin,  she has symptoms of meralgia paresthetica on right side for a long time, she was on Lyrica and stopped taking it again, states medication does not work but pain has been constant, burning like and would like something to help with discomfort. . She denies polyphagia or polydipsia , she has nocturia. Last A1C was 6.3%.today was 6.2 %  She is on ARB ( Azor ) continue healthy diet   Gout: . She is back on Allopurinol now . Doing well at this time. No recent episodes. Last uric acid 6.2.Off HCTZ. Unchanged   Nocturia: she does not void frequently during the day, drinks fluids all day without problems, however wakes up multiple times to void during the night, going on for years. She snores , she feels tired when she wakes up in the morning, but denies morning headaches. Discussed sleep study to rule out OSA . Her ESS was 1 and she will not qualify for the test, we will try avoiding fluids at night and we added Detrol back in Summer 2020but she stopped on her own because it was not improving symptoms.She is not able to stop drinking after 6 pm   Cough:seen by Dr. 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 Unchanged   Atherosclerosis of aorta: on aspirin and statin therapy  Vitamin B12 deficiency:she is taking otc supplementation   Chronic kidney disease: she is seeing Dr. 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 No pruritis, normal urine output .    Patient Active Problem List   Diagnosis Date Noted  . COVID-19 virus detected 03/29/2019  . 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 Incomplete rotator cuff tear or rupture of left shoulder, not specified as traumatic 07/26/2016  . Thoracic aortic atherosclerosis ([place]) 01/12/2016  . 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 B12 deficiency 01/11/2016  . Hernia of abdominal cavity 09/15/2014  . Impaired renal function 08/10/2014  . 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 Dyslipidemia 08/10/2014  . Interval gout 08/10/2014  . Microalbuminuria 08/10/2014  . Osteoarthritis of left knee 08/10/2014  . Vitamin D deficiency 08/10/2014  . Cough 01/21/2014  . Intraductal papilloma of breast 06/27/2013  . Hypertension, essential, benign 09/02/2009  . ABNORMAL EKG 09/02/2009    Past Surgical History:  Procedure Laterality Date  . BREAST BIOPSY Left 05/24/2013  neg  . BREAST EXCISIONAL BIOPSY Left 06/21/2013   neg/ dr. Bary Castilla  . BREAST SURGERY Left 2015  . COLONOSCOPY  2009   1 polyp found. Dr. Roselyn Reef  . HERNIA REPAIR  26/71/2458   Ventral/umbilical hernia repair with 10 x 10 cm Atrium mesh in preperitoneal position.  . INSERTION OF MESH N/A 10/09/2014   Procedure: INSERTION OF MESH;  Surgeon: Robert Bellow, MD;  Location: [facility] ORS;  Service: General;   Laterality: N/A;  . KNEE SURGERY Left 01/14/2009  . STRABISMUS SURGERY     Repair as a child  . TUBAL LIGATION  1990  . VENTRAL HERNIA REPAIR N/A 10/09/2014   Procedure: HERNIA REPAIR VENTRAL ADULT;  Surgeon: Robert Bellow, MD;  Location: [facility] ORS;  Service: General;  Laterality: N/A;    Family History  Problem Relation Age of Onset  . Hypertension Mother   . Diabetes Mother   . Heart attack Father   . Coronary artery disease Father   . Breast cancer Father 25  . Allergies Son        Barista  . Diabetes Sister   . Diabetes Brother   . Gout Brother     Social History   Tobacco Use  . Smoking status: Former Smoker    Types: Cigarettes    Quit date: 10/02/1983    Years since quitting: 35.9  . Smokeless tobacco: Never Used  Substance Use Topics  . Alcohol use: No    Alcohol/week: 0.0 standard drinks     Current Outpatient Medications:  .  albuterol (VENTOLIN HFA) 108 (90 Base) MCG/ACT inhaler, Inhale 2 puffs into the lungs every 6 (six) hours as needed for wheezing or shortness of breath., Disp: 18 g, Rfl: 0 .  allopurinol (ZYLOPRIM) 100 MG tablet, Take 1 tablet (100 mg total) by mouth daily., Disp: 90 tablet, Rfl: 1 .  amLODipine-olmesartan (AZOR) 10-40 MG tablet, Take 1 tablet by mouth daily., Disp: 90 tablet, Rfl: 1 .  ammonium lactate (LAC-HYDRIN) 12 % cream, Apply topically as needed for dry skin., Disp: 385 g, Rfl: 0 .  aspirin 81 MG EC tablet, Take 81 mg by mouth daily.  , Disp: , Rfl:  .  atorvastatin (LIPITOR) 40 MG tablet, Take 1 tablet by mouth once daily, Disp: 90 tablet, Rfl: 0 .  budesonide-formoterol (SYMBICORT) 160-4.5 MCG/ACT inhaler, Inhale 2 puffs into the lungs 2 (two) times daily., Disp: 1 Inhaler, Rfl: 2 .  carvedilol (COREG) 12.5 MG tablet, Take 1 tablet (12.5 mg total) by mouth 2 (two) times daily with a meal., Disp: 180 tablet, Rfl: 0 .  Cyanocobalamin 500 MCG SUBL, Place 1 tablet (500 mcg total) under the tongue daily., Disp: 90  tablet, Rfl: 1 .  fluticasone (FLONASE) 50 MCG/ACT nasal spray, Place 2 sprays into both nostrils daily., Disp: 16 g, Rfl: 6 .  glucose blood (FREESTYLE LITE) test strip, FREESTYLE LITE TEST (In Vitro Strip)  check fsbs once a day for 0 days  Quantity: 100.00;  Refills: 5   Ordered :31-Dec-2009  Steele Sizer MD;  Dani Gobble Active Comments: DX: 250.00, Disp: , Rfl:  .  LANCETS ULTRA THIN 30G MISC, , Disp: , Rfl:  .  sitaGLIPtin (JANUVIA) 25 MG tablet, Take 1 tablet (25 mg total) by mouth daily. In place of Metformin, Disp: 30 tablet, Rfl: 2 .  Vitamin D, Ergocalciferol, (DRISDOL) 1.25 MG (50000 UNIT) CAPS capsule, Take 1 capsule (50,000 Units total) by mouth every 7 (seven) days., Disp:  12 capsule, Rfl: 1  No Known Allergies  I personally reviewed active problem list, medication list, allergies, family history, social history, health maintenance with the patient/caregiver today.   ROS  Constitutional: Negative for fever or weight change.  Respiratory: Negative for cough and shortness of breath.   Cardiovascular: Negative for chest pain or palpitations.  Gastrointestinal: Negative for abdominal pain, no bowel changes.  Musculoskeletal: Negative for gait problem or joint swelling.  Skin: Negative for rash.  Neurological: Negative for dizziness or headache.  No other specific complaints in a complete review of systems (except as listed in HPI above).  Objective  Vitals:   08/21/19 1553  BP: 120/80  Pulse: 90  Resp: 16  Temp: (!) 96.8 F (36 C)  TempSrc: Temporal  SpO2: 98%  Weight: 204 lb 9.6 oz (92.8 kg)  Height: 5\' 7"  (1.702 m)    Body mass index is 32.04 kg/m.  Physical Exam  Constitutional: Patient appears well-developed and well-nourished. Obese  No distress.  HEENT: head atraumatic, normocephalic, pupils equal and reactive to light,  neck supple Cardiovascular: Normal rate, regular rhythm and normal heart sounds.  No murmur heard. No BLE  edema. Pulmonary/Chest: Effort normal and breath sounds normal. No respiratory distress. Abdominal: Soft.  There is no tenderness. Psychiatric: Patient has a normal mood and affect. behavior is normal. Judgment and thought content normal.  Recent Results (from the past 2160 hour(s))  APTT upon arrival     Status: None   Collection Time: 05/24/19  7:54 AM  Result Value Ref Range   aPTT 30 24 - 36 seconds    Comment: Performed at [facility], [address] [place], [place] [address]  CBC upon arrival     Status: Abnormal   Collection Time: 05/24/19  7:54 AM  Result Value Ref Range   WBC 6.3 4.0 - 10.5 K/uL   RBC 3.89 3.87 - 5.11 MIL/uL   Hemoglobin 10.1 (L) 12.0 - 15.0 g/dL   HCT 31.6 (L) 36 - 46 %   MCV 81.2 80.0 - 100.0 fL   MCH 26.0 26.0 - 34.0 pg   MCHC 32.0 30.0 - 36.0 g/dL   RDW 15.7 (H) 11.5 - 15.5 %   Platelets 325 150 - 400 K/uL   nRBC 0.0 0.0 - 0.2 %    Comment: Performed at [facility], [address] [place], [place] [address]  Protime-INR upon arrival     Status: None   Collection Time: 05/24/19  7:54 AM  Result Value Ref Range   Prothrombin Time 11.9 11.4 - 15.2 seconds   INR 0.9 0.8 - 1.2    Comment: (NOTE) INR goal varies based on device and disease states. Performed at [facility], [address] [place],  [address]   Glucose, capillary     Status: Abnormal   Collection Time: 05/24/19  7:59 AM  Result Value Ref Range   Glucose-Capillary 127 (H) 70 - 99 mg/dL    Comment: Glucose reference range applies only to samples taken after fasting for at least 8 hours.  Surgical pathology     Status: None   Collection Time: 05/24/19  9:43 AM  Result Value Ref Range   SURGICAL PATHOLOGY      SURGICAL PATHOLOGY CASE: [id number] PATIENT: Omie Veldhuizen Surgical Pathology Report     Specimen Submitted: A. Kidney, left  Clinical history: Proteinuria, chronic kidney disease.      DIAGNOSIS: SEE REPORT UNDER  RESULTS "[facility] KIDNEY BIOPSY SEND  OUT" IN [facility].     Final Diagnosis performed by Betsy Pries, MD.   Electronically signed 06/05/2019 11:16:59AM The electronic signature indicates that the named Attending Pathologist has evaluated the specimen Technical component performed at [facility], [address] [place], [place] [address] Lab: [phone] Dir: Rush Farmer, MD, MMM  Professional component performed at [facility], [place], [place], [place] [address] Lab: [phone] Dir: Dellia Nims. Rubinas, MD   POCT HgB A1C     Status: Abnormal   Collection Time: 08/21/19  3:57 PM  Result Value Ref Range   Hemoglobin A1C 6.2 (A) 4.0 - 5.6 %   HbA1c POC (<> result, manual entry)     HbA1c, POC (prediabetic range)     HbA1c, POC (controlled diabetic range)        PHQ2/9: Depression screen [facility] 2/9 08/21/2019 05/21/2019 04/10/2019 02/19/2019 11/27/2018  Decreased Interest 0 0 0 0 0  Down, Depressed, Hopeless 0 0 0 0 0  PHQ - 2 Score 0 0 0 0 0  Altered sleeping 0 0 0 0 -  Tired, decreased energy 0 0 0 0 -  Change in appetite 0 0 0 0 -  Feeling bad or failure about yourself  0 0 0 0 -  Trouble concentrating 0 0 0 0 -  Moving slowly or fidgety/restless 0 0 0 0 -  Suicidal thoughts 0 0 0 0 -  PHQ-9 Score 0 0 0 0 -  Difficult doing work/chores - - - Not difficult at all -  Some recent data might be hidden    phq 9 is negative   Fall Risk: Fall Risk  08/21/2019 05/21/2019 04/10/2019 02/19/2019 11/27/2018  Falls in the past year? 0 0 0 0 0  Number falls in past yr: 0 0 0 0 -  Injury with Fall? 0 0 0 0 -  Follow up - - - Falls evaluation completed Falls evaluation completed     Assessment & Plan   1. Controlled type 2 diabetes mellitus with microalbuminuria, without long-term current use of insulin (HCC)  - POCT HgB A1C  2. Meralgia paresthetica of right side  - Ambulatory referral to Pain Clinic  3. Dyslipidemia  Continue medication   4. Thoracic  aortic atherosclerosis ([place])  On statin therapy   5. Hypertension, essential, benign  At goal   6. Vitamin D deficiency  Continue supplementation  7. Controlled gout   8. Mild intermittent reactive airway disease without complication  Doing well at this time  9. B12 deficiency  On supplementation   10. Hyperparathyroidism due to renal insufficiency (HCC)  Under the care of Dr. Candiss Norse   11. Anemia in stage 4 chronic kidney disease ([place])   12. Chronic kidney disease, stage 4 (severe) (HCC)  She is seeing Dr. Candiss Norse

## 2019-08-29 ENCOUNTER — Other Ambulatory Visit: Payer: Self-pay | Admitting: Family Medicine

## 2019-08-29 DIAGNOSIS — E1129 Type 2 diabetes mellitus with other diabetic kidney complication: Secondary | ICD-10-CM

## 2019-08-29 DIAGNOSIS — E785 Hyperlipidemia, unspecified: Secondary | ICD-10-CM

## 2019-09-06 ENCOUNTER — Ambulatory Visit
Admission: RE | Admit: 2019-09-06 | Discharge: 2019-09-06 | Disposition: A | Discharge status: Home / Self Care | Payer: Commercial Managed Care - PPO | Source: Ambulatory Visit | Attending: Family Medicine | Admitting: Family Medicine

## 2019-09-06 DIAGNOSIS — Z1231 Encounter for screening mammogram for malignant neoplasm of breast: Secondary | ICD-10-CM | POA: Diagnosis present

## 2019-09-29 ENCOUNTER — Other Ambulatory Visit: Payer: Self-pay | Admitting: Family Medicine

## 2019-09-29 DIAGNOSIS — E1129 Type 2 diabetes mellitus with other diabetic kidney complication: Secondary | ICD-10-CM

## 2019-09-29 DIAGNOSIS — R809 Proteinuria, unspecified: Secondary | ICD-10-CM

## 2019-09-29 DIAGNOSIS — M109 Gout, unspecified: Secondary | ICD-10-CM

## 2019-09-29 NOTE — Telephone Encounter (Signed)
Requested Prescriptions  Pending Prescriptions Disp Refills  . JANUVIA 25 MG tablet [Pharmacy Med Name: Januvia 25 MG Oral Tablet] 30 tablet 0    Sig: TAKE 1 TABLET BY MOUTH ONCE DAILY IN  PLACE  OF  METFORMIN     Endocrinology:  Diabetes - DPP-4 Inhibitors Failed - 09/29/2019  6:30 AM      Failed - Cr in normal range and within 360 days    Creat  Date Value Ref Range Status  02/19/2019 1.44 (H) 0.50 - 0.99 mg/dL Final    Comment:    For patients >15 years of age, the reference limit for Creatinine is approximately 13% higher for people identified as African-American. .    Creatinine, Ser  Date Value Ref Range Status  03/27/2019 1.71 (H) 0.44 - 1.00 mg/dL Final   Creatinine, Urine  Date Value Ref Range Status  02/19/2019 96 20 - 275 mg/dL Final         Passed - HBA1C is between 0 and 7.9 and within 180 days    Hemoglobin A1C  Date Value Ref Range Status  08/21/2019 6.2 (A) 4.0 - 5.6 % Final   HbA1c, POC (prediabetic range)  Date Value Ref Range Status  08/23/2017 6.5 (A) 5.7 - 6.4 % Final   HbA1c, POC (controlled diabetic range)  Date Value Ref Range Status  11/15/2018 6.6 0.0 - 7.0 % Final         Passed - Valid encounter within last 6 months    Recent Outpatient Visits          1 month ago Controlled type 2 diabetes mellitus with microalbuminuria, without long-term current use of insulin [facility])   [facility] [place], Drue Stager, MD   4 months ago Dyslipidemia   [facility] [place], Drue Stager, MD   5 months ago History of [facility] [place], Drue Stager, MD   7 months ago Controlled type 2 diabetes mellitus with microalbuminuria, without long-term current use of insulin [facility])   [facility] Steele Sizer, MD   10 months ago Controlled type 2 diabetes mellitus with microalbuminuria, without long-term current use of insulin [facility])   [facility] [place], Drue Stager, MD              . allopurinol (ZYLOPRIM) 100 MG tablet [Pharmacy Med Name: Allopurinol 100 MG Oral Tablet] 90 tablet 0    Sig: Take 1 tablet by mouth once daily     Endocrinology:  Gout Agents Failed - 09/29/2019  6:30 AM      Failed - Uric Acid in normal range and within 360 days    Uric Acid, Serum  Date Value Ref Range Status  04/13/2016 6.3 2.5 - 7.0 mg/dL Final         Failed - Cr in normal range and within 360 days    Creat  Date Value Ref Range Status  02/19/2019 1.44 (H) 0.50 - 0.99 mg/dL Final    Comment:    For patients >36 years of age, the reference limit for Creatinine is approximately 13% higher for people identified as African-American. .    Creatinine, Ser  Date Value Ref Range Status  03/27/2019 1.71 (H) 0.44 - 1.00 mg/dL Final   Creatinine, Urine  Date Value Ref Range Status  02/19/2019 96 20 - 275 mg/dL Final         Passed - Valid encounter within last 12 months    Recent Outpatient  Visits          1 month ago Controlled type 2 diabetes mellitus with microalbuminuria, without long-term current use of insulin [facility])   [facility] Steele Sizer, MD   4 months ago Dyslipidemia   [facility] Steele Sizer, MD   5 months ago History of COVID-19   [facility] [place], Drue Stager, MD   7 months ago Controlled type 2 diabetes mellitus with microalbuminuria, without long-term current use of insulin [facility])   [facility] Steele Sizer, MD   10 months ago Controlled type 2 diabetes mellitus with microalbuminuria, without long-term current use of insulin [facility])   [facility] Steele Sizer, MD

## 2019-09-29 NOTE — Telephone Encounter (Signed)
Requested medications are due for refill today?  Yes  Requested medications are on active medication list?  Yes  Last Refill:   02/19/2019 # 90 with one refill  Future visit scheduled?  No   Notes to Clinic:  Medication failed Rx refill protocol due to no uric acid level past 360 days.  Last uric acid level was performed on 04/13/2016.

## 2019-11-21 ENCOUNTER — Other Ambulatory Visit (HOSPITAL_COMMUNITY)
Admission: RE | Admit: 2019-11-21 | Discharge: 2019-11-21 | Disposition: A | Discharge status: Home / Self Care | Payer: Commercial Managed Care - PPO | Source: Ambulatory Visit | Attending: Family Medicine | Admitting: Family Medicine

## 2019-11-21 ENCOUNTER — Ambulatory Visit (INDEPENDENT_AMBULATORY_CARE_PROVIDER_SITE_OTHER): Payer: Commercial Managed Care - PPO | Admitting: Family Medicine

## 2019-11-21 ENCOUNTER — Other Ambulatory Visit: Payer: Self-pay

## 2019-11-21 ENCOUNTER — Encounter: Payer: Self-pay | Admitting: Family Medicine

## 2019-11-21 VITALS — BP 120/70 | HR 77 | Temp 98.1°F | Resp 16 | Ht 67.0 in | Wt 206.0 lb

## 2019-11-21 DIAGNOSIS — E1159 Type 2 diabetes mellitus with other circulatory complications: Secondary | ICD-10-CM

## 2019-11-21 DIAGNOSIS — I700 Atherosclerosis of aorta: Secondary | ICD-10-CM | POA: Diagnosis not present

## 2019-11-21 DIAGNOSIS — Z0000 Encounter for general adult medical examination without abnormal findings: Secondary | ICD-10-CM

## 2019-11-21 DIAGNOSIS — Z124 Encounter for screening for malignant neoplasm of cervix: Secondary | ICD-10-CM

## 2019-11-21 DIAGNOSIS — N051 Unspecified nephritic syndrome with focal and segmental glomerular lesions: Secondary | ICD-10-CM | POA: Diagnosis not present

## 2019-11-21 DIAGNOSIS — N2581 Secondary hyperparathyroidism of renal origin: Secondary | ICD-10-CM

## 2019-11-21 DIAGNOSIS — E559 Vitamin D deficiency, unspecified: Secondary | ICD-10-CM

## 2019-11-21 DIAGNOSIS — D631 Anemia in chronic kidney disease: Secondary | ICD-10-CM

## 2019-11-21 DIAGNOSIS — I10 Essential (primary) hypertension: Secondary | ICD-10-CM

## 2019-11-21 DIAGNOSIS — E785 Hyperlipidemia, unspecified: Secondary | ICD-10-CM

## 2019-11-21 DIAGNOSIS — J4530 Mild persistent asthma, uncomplicated: Secondary | ICD-10-CM

## 2019-11-21 DIAGNOSIS — E538 Deficiency of other specified B group vitamins: Secondary | ICD-10-CM

## 2019-11-21 DIAGNOSIS — I152 Hypertension secondary to endocrine disorders: Secondary | ICD-10-CM

## 2019-11-21 DIAGNOSIS — N184 Chronic kidney disease, stage 4 (severe): Secondary | ICD-10-CM

## 2019-11-21 DIAGNOSIS — M109 Gout, unspecified: Secondary | ICD-10-CM

## 2019-11-21 MED ORDER — VITAMIN D (ERGOCALCIFEROL) 1.25 MG (50000 UNIT) PO CAPS: 50000.0000 [IU] | ORAL_CAPSULE | ORAL | 1 refills | Status: DC

## 2019-11-21 MED ORDER — BUDESONIDE-FORMOTEROL FUMARATE 160-4.5 MCG/ACT IN AERO: 2.0000 | INHALATION_SPRAY | Freq: Two times a day (BID) | RESPIRATORY_TRACT | 5 refills | Status: DC

## 2019-11-21 MED ORDER — CARVEDILOL 12.5 MG PO TABS: 12.5000 mg | ORAL_TABLET | Freq: Two times a day (BID) | ORAL | 1 refills | Status: DC

## 2019-11-21 MED ORDER — AMLODIPINE-OLMESARTAN 10-40 MG PO TABS: 1.0000 | ORAL_TABLET | Freq: Every day | ORAL | 1 refills | Status: DC

## 2019-11-21 MED ORDER — ALLOPURINOL 100 MG PO TABS: 100.0000 mg | ORAL_TABLET | Freq: Every day | ORAL | 1 refills | Status: DC

## 2019-11-21 MED ORDER — ATORVASTATIN CALCIUM 40 MG PO TABS: 40.0000 mg | ORAL_TABLET | Freq: Every day | ORAL | 1 refills | Status: DC

## 2019-11-21 NOTE — Patient Instructions (Signed)

## 2019-11-21 NOTE — Progress Notes (Signed)
Name: Cheryl Barr   MRN: [id number]    DOB: 1955/05/14   Date:11/21/2019       Progress Note  Subjective  Chief Complaint  Chief Complaint  Patient presents with  . Annual Exam    HPI  Patient presents for annual CPE.  HTN: compliant with medication, Azor,and carvedilol.Shedenies side effects, no chest pain, palpitation. BP is at goal   Hyperlipidemia/Atherosclerosis of aorta :LDL at goal done 11/2018, on Atorvastatin and denies myalgias. Continue medication, we will recheck labs today   DMII with CKI stage IV and also HTN: urine micro was 100 in Dec 2016 Nov 2017 and 01/2019Having proteinuria again , kidney biopsy showed glomerulonephritis . She is onaspirin, she has symptoms of meralgia paresthetica on right side for a long time, she was on Lyrica and stopped taking it again, states medication does not work but pain has been constant, burning like and would like something to help with discomfort. She denies polyphagia , but has polydipsia and also  nocturia. Last A1C was 6.2 %  She is on ARB ( Azor ) continue healthy diet  Gout: . She is back on Allopurinol now . Doing well at this time. No recent episodes. Last uric acid 6.2.Off HCTZ.Unchanged   Nocturia: she does not void frequently during the day, drinks fluids all day without problems, however wakes up multiple times to void during the night, going on for years. She snores , she feels tired when she wakes up in the morning, but denies morning headaches. Discussed sleep study to rule out OSA . Her ESS was 1 and she will not qualify for the test, we will try avoiding fluids at night and we added Detrol back in Summer 2020but she stopped on her own because it was not improving symptoms.She is not able to stop drinking after 6 pm  Cough:seen by Dr. 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 She does not want to be rechecked at this time  Atherosclerosis of aorta:she is on statin therapy and aspirin and we will recheck labs   Vitamin B12 deficiency:she is taking otc supplementation  Chronic kidney disease: sheis seeing Dr. 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 No pruritis, normal urine output . She has been upset about the possibility of having to start dialisis  but explained that one day out of time  Diet: drinking more water, avoiding sodas, eating more vegetables and cutting on red meat  Exercise:she walks a lot at work, discussed 150 minutes per week     [facility] Visit from 11/21/2019 [facility]  AUDIT-C Score 0     Depression: Phq 9 is  negative Depression screen [facility] 2/9 11/21/2019 11/21/2019 08/21/2019 05/21/2019 04/10/2019  Decreased Interest 0 0 0 0 0  Down, Depressed, Hopeless 0 0 0 0 0  PHQ - 2 Score 0 0 0 0 0  Altered sleeping - - 0 0 0  Tired, decreased energy - - 0 0 0  Change in appetite - - 0 0 0  Feeling bad or failure about yourself  - - 0 0 0  Trouble concentrating - - 0 0 0  Moving slowly or fidgety/restless - - 0 0 0  Suicidal thoughts - - 0 0 0  PHQ-9 Score - - 0 0 0  Difficult doing work/chores - - - - -  Some recent data might be hidden   Hypertension: BP Readings from Last 3 Encounters:  11/21/19 120/70  08/21/19 120/80  05/24/19 112/74   Obesity: Wt Readings from Last 3 Encounters:  11/21/19 206 lb (93.4 kg)  08/21/19 204 lb 9.6 oz (92.8 kg)  05/24/19 210 lb (95.3 kg)   BMI Readings from Last 3 Encounters:  11/21/19 32.26 kg/m  08/21/19 32.04 kg/m  05/24/19 32.89 kg/m     Vaccines:   Shingrix: 47-64 yo and ask insurance if covered when patient  above 75 yo Pneumonia:  educated and discussed with patient. Flu: Pt will get at work at the end of Oct 2021.  Hep C Screening: 12/01/2017  STD testing and prevention (HIV/chl/gon/syphilis): not interested  Intimate partner violence: negative screen  Sexual History :same partner, uses condoms no pain during sex or bleeding  Menstrual History/LMP/Abnormal Bleeding: post-menopausal and no vaginal bleeding  Incontinence Symptoms: she nocturia no urgency or stress   Breast cancer:  - Last Mammogram: 09/06/19 - BRCA gene screening: father has a history of breast, she had a biopsy , surgeon aware, she is not interested on testing at this time   Osteoporosis: Discussed high calcium and vitamin D supplementation, weight bearing exercises  Cervical cancer screening: today   Skin cancer: Discussed monitoring for atypical lesions  Colorectal cancer: cologuard repeat in 2022 Lung cancer:   Low Dose CT Chest recommended if Age 64-80 years, 20 pack-year currently smoking OR have quit w/in 15years. Patient does not qualify.   ECG: 03/2019  Advanced Care Planning: A voluntary discussion about advance care planning including the explanation and discussion of advance directives.  Discussed health care proxy and Living will, and the patient was able to identify a health care proxy as Maple Hudson - daughter .  Patient does not have a living will at present time  Lipids: Lab Results  Component Value Date   CHOL 144 11/15/2018   CHOL 125 10/30/2017   CHOL 140 02/22/2017   Lab Results  Component Value Date   HDL 44 (L) 11/15/2018   HDL 45 (L) 10/30/2017   HDL 48 (L) 02/22/2017   Lab Results  Component Value Date   LDLCALC 71 11/15/2018   LDLCALC 60 10/30/2017   LDLCALC 68 02/22/2017   Lab Results  Component Value Date   TRIG 195 (H) 11/15/2018   TRIG 115 10/30/2017   TRIG 153 (H) 02/22/2017   Lab Results  Component Value Date   CHOLHDL 3.3 11/15/2018   CHOLHDL 2.8 10/30/2017    CHOLHDL 2.9 02/22/2017   No results found for: LDLDIRECT  Glucose: Glucose  Date Value Ref Range Status  06/17/2013 137 (H) 65 - 99 mg/dL Final   Glucose, Bld  Date Value Ref Range Status  03/27/2019 137 (H) 70 - 99 mg/dL Final  02/19/2019 128 (H) 65 - 99 mg/dL Final    Comment:    .            Fasting reference interval . For someone without known diabetes, a glucose value >125 mg/dL indicates that they may have diabetes and this should be confirmed with a follow-up test. .   11/15/2018 115 (H) 65 - 99 mg/dL Final    Comment:    .  Fasting reference interval . For someone without known diabetes, a glucose value between 100 and 125 mg/dL is consistent with prediabetes and should be confirmed with a follow-up test. .    Glucose-Capillary  Date Value Ref Range Status  05/24/2019 127 (H) 70 - 99 mg/dL Final    Comment:    Glucose reference range applies only to samples taken after fasting for at least 8 hours.  10/09/2014 148 (H) 65 - 99 mg/dL Final  10/09/2014 126 (H) 65 - 99 mg/dL Final    Patient Active Problem List   Diagnosis Date Noted  . Hyperparathyroidism due to renal insufficiency ([place]) 06/12/2019  . 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 Chronic kidney disease, stage 4 (severe) ([place]) 06/12/2019  . Anemia in chronic kidney disease 06/12/2019  . COVID-19 virus detected 03/29/2019  . 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 Incomplete rotator cuff tear or rupture of left shoulder, not specified as traumatic 07/26/2016  . Thoracic aortic atherosclerosis ([place]) 01/12/2016  . 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 B12 deficiency 01/11/2016  . Hernia of abdominal cavity 09/15/2014  . Impaired renal function 08/10/2014  . 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 Dyslipidemia 08/10/2014  . Interval gout 08/10/2014  . Microalbuminuria 08/10/2014  . Osteoarthritis of left knee 08/10/2014  . Vitamin D  deficiency 08/10/2014  . Cough 01/21/2014  . Intraductal papilloma of breast 06/27/2013  . Hypertension, essential, benign 09/02/2009  . ABNORMAL EKG 09/02/2009    Past Surgical History:  Procedure Laterality Date  . BREAST BIOPSY Left 05/24/2013   neg  . BREAST EXCISIONAL BIOPSY Left 06/21/2013   neg/ dr. Bary Castilla  . BREAST SURGERY Left 2015  . COLONOSCOPY  2009   1 polyp found. Dr. Roselyn Reef  . HERNIA REPAIR  96/[id number]/9163   Ventral/umbilical hernia repair with 10 x 10 cm Atrium mesh in preperitoneal position.  . INSERTION OF MESH N/A 10/09/2014   Procedure: INSERTION OF MESH;  Surgeon: Robert Bellow, MD;  Location: [facility] ORS;  Service: General;  Laterality: N/A;  . KNEE SURGERY Left 01/14/2009  . STRABISMUS SURGERY     Repair as a child  . TUBAL LIGATION  1990  . VENTRAL HERNIA REPAIR N/A 10/09/2014   Procedure: HERNIA REPAIR VENTRAL ADULT;  Surgeon: Robert Bellow, MD;  Location: [facility] ORS;  Service: General;  Laterality: N/A;    Family History  Problem Relation Age of Onset  . Hypertension Mother   . Diabetes Mother   . Heart attack Father   . Coronary artery disease Father   . Breast cancer Father 18  . Allergies Son        Barista  . Diabetes Sister   . Diabetes Brother   . Gout Brother     Social History   Socioeconomic History  . Marital status: Widowed    Spouse name: Not on file  . Number of children: 3  . Years of education: 12th Grade  . Highest education level: Not on file  Occupational History    Employer: [employer]  Tobacco Use  . Smoking status: Former Smoker    Types: Cigarettes    Quit date: 10/02/1983    Years since quitting: 36.1  . Smokeless tobacco: Never Used  Vaping Use  . Vaping Use: Never used  Substance and Sexual Activity  . Alcohol use: No    Alcohol/week: 0.0 standard drinks  . Drug use: No  . Sexual activity: Yes  Partners: Male    Birth control/protection: None    Comment: declined condoms   Other  Topics Concern  . Not on file  Social History Narrative   Widowed.    No regular exercise.   Social Determinants of Health   Financial Resource Strain: Low Risk   . Difficulty of Paying Living Expenses: Not hard at all  Food Insecurity: No Food Insecurity  . Worried About Charity fundraiser in the Last Year: Never true  . Ran Out of Food in the Last Year: Never true  Transportation Needs: No Transportation Needs  . Lack of Transportation (Medical): No  . Lack of Transportation (Non-Medical): No  Physical Activity: Inactive  . Days of Exercise per Week: 0 days  . Minutes of Exercise per Session: 0 min  Stress: No Stress Concern Present  . Feeling of Stress : Not at all  Social Connections: Moderately Isolated  . Frequency of Communication with Friends and Family: Once a week  . Frequency of Social Gatherings with Friends and Family: Twice a week  . Attends Religious Services: More than 4 times per year  . Active Member of Clubs or Organizations: No  . Attends Archivist Meetings: Never  . Marital Status: Widowed  Intimate Partner Violence: Not At Risk  . Fear of Current or Ex-Partner: No  . Emotionally Abused: No  . Physically Abused: No  . Sexually Abused: No     Current Outpatient Medications:  .  albuterol (VENTOLIN HFA) 108 (90 Base) MCG/ACT inhaler, Inhale 2 puffs into the lungs every 6 (six) hours as needed for wheezing or shortness of breath., Disp: 18 g, Rfl: 0 .  allopurinol (ZYLOPRIM) 100 MG tablet, Take 1 tablet (100 mg total) by mouth daily., Disp: 90 tablet, Rfl: 1 .  amLODipine-olmesartan (AZOR) 10-40 MG tablet, Take 1 tablet by mouth daily., Disp: 90 tablet, Rfl: 1 .  ammonium lactate (LAC-HYDRIN) 12 % cream, Apply topically as needed for dry skin., Disp: 385 g, Rfl: 0 .  aspirin 81 MG EC tablet, Take 81 mg by mouth daily.  , Disp: , Rfl:  .  atorvastatin (LIPITOR) 40 MG tablet, Take 1 tablet (40 mg total) by mouth daily., Disp: 90 tablet, Rfl: 1 .   carvedilol (COREG) 12.5 MG tablet, Take 1 tablet (12.5 mg total) by mouth 2 (two) times daily with a meal., Disp: 180 tablet, Rfl: 1 .  Cyanocobalamin 500 MCG SUBL, Place 1 tablet (500 mcg total) under the tongue daily., Disp: 90 tablet, Rfl: 1 .  fluticasone (FLONASE) 50 MCG/ACT nasal spray, Place 2 sprays into both nostrils daily., Disp: 16 g, Rfl: 6 .  glucose blood (FREESTYLE LITE) test strip, FREESTYLE LITE TEST (In Vitro Strip)  check fsbs once a day for 0 days  Quantity: 100.00;  Refills: 5   Ordered :31-Dec-2009  Steele Sizer MD;  Dani Gobble Active Comments: DX: 250.00, Disp: , Rfl:  .  JANUVIA 25 MG tablet, TAKE 1 TABLET BY MOUTH ONCE DAILY IN  PLACE  OF  METFORMIN, Disp: 90 tablet, Rfl: 1 .  LANCETS ULTRA THIN 30G MISC, , Disp: , Rfl:  .  Vitamin D, Ergocalciferol, (DRISDOL) 1.25 MG (50000 UNIT) CAPS capsule, Take 1 capsule (50,000 Units total) by mouth every 7 (seven) days., Disp: 12 capsule, Rfl: 1 .  budesonide-formoterol (SYMBICORT) 160-4.5 MCG/ACT inhaler, Inhale 2 puffs into the lungs 2 (two) times daily., Disp: 1 each, Rfl: 5  No Known Allergies   ROS  Constitutional: Negative  for fever or weight change.  Respiratory: Positive  for cough but no  shortness of breath.   Cardiovascular: Negative for chest pain or palpitations.  Gastrointestinal: Negative for abdominal pain, no bowel changes.  Musculoskeletal: Negative for gait problem or joint swelling.  Skin: Negative for rash.  Neurological: Negative for dizziness or headache.  No other specific complaints in a complete review of systems (except as listed in HPI above).  Objective  Vitals:   11/21/19 1113  BP: 120/70  Pulse: 77  Resp: 16  Temp: 98.1 F (36.7 C)  SpO2: 99%  Weight: 206 lb (93.4 kg)  Height: _0  (1.702 m)    Body mass index is 32.26 kg/m.  Physical Exam  Constitutional: Patient appears well-developed and well-nourished. No distress.  HENT: Head: Normocephalic and atraumatic.  Ears: B TMs ok, no erythema or effusion; Nose: Nose normal. Mouth/Throat: Oropharynx is clear and moist. No oropharyngeal exudate.  Eyes: Conjunctivae and EOM are normal. Pupils are equal, round, and reactive to light. No scleral icterus.  Neck: Normal range of motion. Neck supple. No JVD present. No thyromegaly present.  Cardiovascular: Normal rate, regular rhythm and normal heart sounds.  No murmur heard. No BLE edema. Pulmonary/Chest: Effort normal and breath sounds normal. No respiratory distress. Abdominal: Soft. Bowel sounds are normal, no distension. There is no tenderness. no masses Breast: no lumps or masses, no nipple discharge or rashes FEMALE GENITALIA:  External genitalia normal External urethra normal Vaginal vault normal without discharge or lesions Cervix normal without discharge or lesions Bimanual exam normal without masses, she has a cystocele  RECTAL: not done  Musculoskeletal: Normal range of motion, no joint effusions. No gross deformities Neurological: he is alert and oriented to person, place, and time. No cranial nerve deficit. Coordination, balance, strength, speech and gait are normal.  Skin: Skin is warm and dry. No rash noted. No erythema.  Psychiatric: Patient has a normal mood and affect. behavior is normal. Judgment and thought content normal.   Diabetic Foot Exam: Diabetic Foot Exam - Simple   Simple Foot Form Diabetic Foot exam was performed with the following findings: Yes 11/21/2019 11:50 AM  Visual Inspection See comments: Yes Sensation Testing Intact to touch and monofilament testing bilaterally: Yes Pulse Check Posterior Tibialis and Dorsalis pulse intact bilaterally: Yes Comments Thick toenails       Fall Risk: Fall Risk  11/21/2019 08/21/2019 05/21/2019 04/10/2019 02/19/2019  Falls in the past year? 0 0 0 0 0  Number falls in past yr: 0 0 0 0 0  Injury with Fall? 0 0 0 0 0  Follow up - - - - Falls evaluation completed    Functional Status  Survey: Is the patient deaf or have difficulty hearing?: No Does the patient have difficulty seeing, even when wearing glasses/contacts?: No Does the patient have difficulty concentrating, remembering, or making decisions?: No Does the patient have difficulty walking or climbing stairs?: No Does the patient have difficulty dressing or bathing?: No Does the patient have difficulty doing errands alone such as visiting a doctor's office or shopping?: No   Assessment & Plan  1. Focal segmental glomerulosclerosis  - PTH, intact and calcium  2. Well adult exam   3. Cervical cancer screening  - Cytology - PAP  4. Hypertension associated with type 2 diabetes mellitus (HCC)  - amLODipine-olmesartan (AZOR) 10-40 MG tablet; Take 1 tablet by mouth daily.  Dispense: 90 tablet; Refill: 1 - carvedilol (COREG) 12.5 MG tablet; Take 1 tablet (12.5  mg total) by mouth 2 (two) times daily with a meal.  Dispense: 180 tablet; Refill: 1 - COMPLETE METABOLIC PANEL WITH GFR - CBC with Differential/Platelet - Hemoglobin A1c  5. Thoracic aortic atherosclerosis ([place])  On statin therapy   6. Dyslipidemia  - atorvastatin (LIPITOR) 40 MG tablet; Take 1 tablet (40 mg total) by mouth daily.  Dispense: 90 tablet; Refill: 1 - Lipid panel  7. Vitamin D deficiency  - Vitamin D, Ergocalciferol, (DRISDOL) 1.25 MG (50000 UNIT) CAPS capsule; Take 1 capsule (50,000 Units total) by mouth every 7 (seven) days.  Dispense: 12 capsule; Refill: 1 - VITAMIN D 25 Hydroxy (Vit-D Deficiency, Fractures)  8. B12 deficiency   9. Controlled gout  - allopurinol (ZYLOPRIM) 100 MG tablet; Take 1 tablet (100 mg total) by mouth daily.  Dispense: 90 tablet; Refill: 1  10. 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 Anemia in stage 4 chronic kidney disease ([place])   12. Chronic kidney disease, stage 4 (severe) (HCC)  - amLODipine-olmesartan (AZOR) 10-40 MG tablet; Take 1 tablet by  mouth daily.  Dispense: 90 tablet; Refill: 1 - Uric acid - Magnesium  13. Hypertension, essential, benign  - amLODipine-olmesartan (AZOR) 10-40 MG tablet; Take 1 tablet by mouth daily.  Dispense: 90 tablet; Refill: 1 - carvedilol (COREG) 12.5 MG tablet; Take 1 tablet (12.5 mg total) by mouth 2 (two) times daily with a meal.  Dispense: 180 tablet; Refill: 1  15. Mild intermittent reactive airway disease   - budesonide-formoterol (SYMBICORT) 160-4.5 MCG/ACT inhaler; Inhale 2 puffs into the lungs 2 (two) times daily.  Dispense: 1 each; Refill: 5  -USPSTF grade A and B recommendations reviewed with patient; age-appropriate recommendations, preventive care, screening tests, etc discussed and encouraged; healthy living encouraged; see AVS for patient education given to patient -Discussed importance of 150 minutes of physical activity weekly, eat two servings of fish weekly, eat one serving of tree nuts ( cashews, pistachios, pecans, almonds.Marland Kitchen) every other day, eat 6 servings of fruit/vegetables daily and drink plenty of water and avoid sweet beverages.

## 2019-11-22 LAB — HEMOGLOBIN A1C
Hgb A1c MFr Bld: 6.3 % of total Hgb — ABNORMAL HIGH (ref <5.7)
Mean Plasma Glucose: 134 (calc)
eAG (mmol/L): 7.4 (calc)

## 2019-11-22 LAB — COMPLETE METABOLIC PANEL WITH GFR
AG Ratio: 1.1 (calc) (ref 1.0–2.5)
ALT: 12 U/L (ref 6–29)
AST: 13 U/L (ref 10–35)
Albumin: 4.1 g/dL (ref 3.6–5.1)
Alkaline phosphatase (APISO): 83 U/L (ref 37–153)
BUN/Creatinine Ratio: 20 (calc) (ref 6–22)
BUN: 42 mg/dL — ABNORMAL HIGH (ref 7–25)
CO2: 28 mmol/L (ref 20–32)
Calcium: 9.5 mg/dL (ref 8.6–10.4)
Chloride: 106 mmol/L (ref 98–110)
Creat: 2.11 mg/dL — ABNORMAL HIGH (ref 0.50–0.99)
GFR, Est African American: 28 mL/min/{1.73_m2} — ABNORMAL LOW (ref >=60)
GFR, Est Non African American: 24 mL/min/{1.73_m2} — ABNORMAL LOW (ref >=60)
Globulin: 3.6 g/dL (calc) (ref 1.9–3.7)
Glucose, Bld: 106 mg/dL — ABNORMAL HIGH (ref 65–99)
Potassium: 3.8 mmol/L (ref 3.5–5.3)
Sodium: 143 mmol/L (ref 135–146)
Total Bilirubin: 0.4 mg/dL (ref 0.2–1.2)
Total Protein: 7.7 g/dL (ref 6.1–8.1)

## 2019-11-22 LAB — MAGNESIUM: Magnesium: 1.8 mg/dL (ref 1.5–2.5)

## 2019-11-22 LAB — CYTOLOGY - PAP
Comment: NEGATIVE
Diagnosis: NEGATIVE
High risk HPV: NEGATIVE

## 2019-11-22 LAB — CBC WITH DIFFERENTIAL/PLATELET
Absolute Monocytes: 328 cells/uL (ref 200–950)
Basophils Absolute: 38 cells/uL (ref 0–200)
Basophils Relative: 0.6 %
Eosinophils Absolute: 132 cells/uL (ref 15–500)
Eosinophils Relative: 2.1 %
HCT: 32 % — ABNORMAL LOW (ref 35.0–45.0)
Hemoglobin: 10.4 g/dL — ABNORMAL LOW (ref 11.7–15.5)
Lymphs Abs: 2092 cells/uL (ref 850–3900)
MCH: 26.4 pg — ABNORMAL LOW (ref 27.0–33.0)
MCHC: 32.5 g/dL (ref 32.0–36.0)
MCV: 81.2 fL (ref 80.0–100.0)
MPV: 9.4 fL (ref 7.5–12.5)
Monocytes Relative: 5.2 %
Neutro Abs: 3711 cells/uL (ref 1500–7800)
Neutrophils Relative %: 58.9 %
Platelets: 320 10*3/uL (ref 140–400)
RBC: 3.94 10*6/uL (ref 3.80–5.10)
RDW: 15.5 % — ABNORMAL HIGH (ref 11.0–15.0)
Total Lymphocyte: 33.2 %
WBC: 6.3 10*3/uL (ref 3.8–10.8)

## 2019-11-22 LAB — VITAMIN D 25 HYDROXY (VIT D DEFICIENCY, FRACTURES): Vit D, 25-Hydroxy: 59 ng/mL (ref 30–100)

## 2019-11-22 LAB — PTH, INTACT AND CALCIUM
Calcium: 9.5 mg/dL (ref 8.6–10.4)
PTH: 248 pg/mL — ABNORMAL HIGH (ref 14–64)

## 2019-11-22 LAB — LIPID PANEL
Cholesterol: 194 mg/dL (ref <200)
HDL: 48 mg/dL — ABNORMAL LOW (ref >=50)
LDL Cholesterol (Calc): 116 mg/dL (calc) — ABNORMAL HIGH
Non-HDL Cholesterol (Calc): 146 mg/dL (calc) — ABNORMAL HIGH (ref <130)
Total CHOL/HDL Ratio: 4 (calc) (ref <5.0)
Triglycerides: 188 mg/dL — ABNORMAL HIGH (ref <150)

## 2019-11-22 LAB — URIC ACID: Uric Acid, Serum: 9.1 mg/dL — ABNORMAL HIGH (ref 2.5–7.0)

## 2019-11-25 ENCOUNTER — Other Ambulatory Visit: Payer: Self-pay | Admitting: Family Medicine

## 2019-11-25 DIAGNOSIS — J4530 Mild persistent asthma, uncomplicated: Secondary | ICD-10-CM

## 2019-11-25 MED ORDER — BREO ELLIPTA 100-25 MCG/INH IN AEPB: 1.0000 | INHALATION_SPRAY | Freq: Every day | RESPIRATORY_TRACT | 0 refills | Status: DC

## 2020-02-19 NOTE — Progress Notes (Signed)
Name: Cheryl Barr   MRN: [id number]    DOB: September 02, 1955   Date:02/20/2020       Progress Note  Subjective  Chief Complaint  Follow up   HPI   HTN: compliant with medication, Azor,and carvedilol. Shedenies side effects, no chest pain, palpitationbut has some SOB but stable. No dizziness. BP at goal today and also during visit with nephrologist   Hyperlipidemia/Atherosclerosis of aorta :LDL at goal done 11/2018, on Atorvastatin 40mg   and denies myalgias. Last LDL was 116 goal is below  70 , we will change to Crestor 40 mg  DMII with CKI stage IV and also HTN: urine micro was 100 , n , kidney biopsy showed glomerulonephritis She is onaspirin,  she has symptoms of meralgia paresthetica on right side for a long time, she was on Lyrica and stopped taking it again, states medication does not work but pain has been constant, burning like and would like something to help with discomfort. .She denies polyphagia, but has noticed polydipsia  she has nocturia. A1C is down to 6.1 % on low dose Januvia.   She is on ARB ( Azor ) continue healthy diet   Gout: . She is back on Allopurinol now . Doing well at this time. No recent episodes. Last uric acid was high, we will switch to Uloric and monitor   Nocturia: she does not void frequently during the day, drinks fluids all day without problems, however wakes up multiple times to void during the night, going on for years. She snores , she feels tired when she wakes up in the morning, but denies morning headaches. Discussed sleep study to rule out OSA . Her ESS was 1 and she will not qualify for the test, we will try avoiding fluids at night and we added Detrol back in Summer 2020but she stopped on her own because it was not improving symptoms.She is not able to stop drinking after 6 pm . Discussed referral to Urologist but she wants to hold off for now   Cough:seen by Dr. Stevenson Clinch in 2016, she responded to Qvar and possible diagnosis of RAD since  spirometry was normal.She has noticed dry cough again, she is only using Ventolin prn   Atherosclerosis of aorta: on aspirin and statin therapy, we will change to Crestor   Vitamin B12 deficiency:she is taking otc supplementation   Chronic kidney disease: she is seeing Dr. 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 No pruritis, normal urine output . She has a follow up scheduled for 03/09/2020   Patient Active Problem List   Diagnosis Date Noted  . Hyperparathyroidism due to renal insufficiency ([place]) 06/12/2019  . 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 Chronic kidney disease, stage 4 (severe) ([place]) 06/12/2019  . Anemia in chronic kidney disease 06/12/2019  . COVID-19 virus detected 03/29/2019  . 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 Incomplete rotator cuff tear or rupture of left shoulder, not specified as traumatic 07/26/2016  . Thoracic aortic atherosclerosis ([place]) 01/12/2016  . 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 B12 deficiency 01/11/2016  . Hernia of abdominal cavity 09/15/2014  . Impaired renal function 08/10/2014  . 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 Dyslipidemia 08/10/2014  . Interval gout 08/10/2014  . Microalbuminuria 08/10/2014  . Osteoarthritis of left knee 08/10/2014  . Vitamin D deficiency 08/10/2014  . Cough 01/21/2014  . Intraductal papilloma of breast 06/27/2013  . Hypertension, essential, benign 09/02/2009  .  ABNORMAL EKG 09/02/2009    Past Surgical History:  Procedure Laterality Date  . BREAST BIOPSY Left 05/24/2013   neg  . BREAST EXCISIONAL BIOPSY Left 06/21/2013   neg/ dr. Bary Castilla  . BREAST SURGERY Left 2015  . COLONOSCOPY  2009   1 polyp found. Dr. Roselyn Reef  . HERNIA REPAIR  89/38/1017   Ventral/umbilical hernia repair with 10 x 10 cm Atrium mesh in preperitoneal position.  . INSERTION OF MESH N/A 10/09/2014   Procedure:  INSERTION OF MESH;  Surgeon: Robert Bellow, MD;  Location: [facility] ORS;  Service: General;  Laterality: N/A;  . KNEE SURGERY Left 01/14/2009  . STRABISMUS SURGERY     Repair as a child  . TUBAL LIGATION  1990  . VENTRAL HERNIA REPAIR N/A 10/09/2014   Procedure: HERNIA REPAIR VENTRAL ADULT;  Surgeon: Robert Bellow, MD;  Location: [facility] ORS;  Service: General;  Laterality: N/A;    Family History  Problem Relation Age of Onset  . Hypertension Mother   . Diabetes Mother   . Heart attack Father   . Coronary artery disease Father   . Breast cancer Father 39  . Allergies Son        Barista  . Diabetes Sister   . Diabetes Brother   . Gout Brother     Social History   Tobacco Use  . Smoking status: Former Smoker    Types: Cigarettes    Quit date: 10/02/1983    Years since quitting: 36.4  . Smokeless tobacco: Never Used  Substance Use Topics  . Alcohol use: No    Alcohol/week: 0.0 standard drinks     Current Outpatient Medications:  .  amLODipine-olmesartan (AZOR) 10-40 MG tablet, Take 1 tablet by mouth daily., Disp: 90 tablet, Rfl: 1 .  aspirin 81 MG EC tablet, Take 81 mg by mouth daily., Disp: , Rfl:  .  carvedilol (COREG) 12.5 MG tablet, Take 1 tablet (12.5 mg total) by mouth 2 (two) times daily with a meal., Disp: 180 tablet, Rfl: 1 .  febuxostat (ULORIC) 40 MG tablet, Take 1 tablet (40 mg total) by mouth daily. In place of Allopurinol for gout, Disp: 90 tablet, Rfl: 1 .  fluticasone (FLONASE) 50 MCG/ACT nasal spray, Place 2 sprays into both nostrils daily., Disp: 16 g, Rfl: 6 .  fluticasone furoate-vilanterol (BREO ELLIPTA) 100-25 MCG/INH AEPB, Inhale 1 puff into the lungs daily., Disp: 60 each, Rfl: 0 .  rosuvastatin (CRESTOR) 40 MG tablet, Take 1 tablet (40 mg total) by mouth daily., Disp: 90 tablet, Rfl: 1 .  albuterol (VENTOLIN HFA) 108 (90 Base) MCG/ACT inhaler, Inhale 2 puffs into the lungs every 6 (six) hours as needed for wheezing or shortness of breath.,  Disp: 18 g, Rfl: 0 .  ammonium lactate (LAC-HYDRIN) 12 % cream, Apply topically as needed for dry skin. (Patient not taking: Reported on 02/20/2020), Disp: 385 g, Rfl: 0 .  Cyanocobalamin 500 MCG SUBL, Place 1 tablet (500 mcg total) under the tongue daily. (Patient not taking: Reported on 02/20/2020), Disp: 90 tablet, Rfl: 1 .  glucose blood test strip, FREESTYLE LITE TEST (In Vitro Strip)  check fsbs once a day for 0 days  Quantity: 100.00;  Refills: 5   Ordered :31-Dec-2009  Steele Sizer MD;  Dani Gobble Active Comments: DX: 250.00 (Patient not taking: Reported on 02/20/2020), Disp: , Rfl:  .  LANCETS ULTRA THIN 30G MISC, , Disp: , Rfl:  .  sitaGLIPtin (JANUVIA) 25 MG tablet, Take  1 tablet (25 mg total) by mouth daily., Disp: 90 tablet, Rfl: 1 .  Vitamin D, Ergocalciferol, (DRISDOL) 1.25 MG (50000 UNIT) CAPS capsule, Take 1 capsule (50,000 Units total) by mouth every 7 (seven) days. (Patient not taking: Reported on 02/20/2020), Disp: 12 capsule, Rfl: 1  No Known Allergies  I personally reviewed active problem list, medication list, allergies, family history, social history, health maintenance with the patient/caregiver today.   ROS  Constitutional: Negative for fever or weight change.  Respiratory: positive  for cough and shortness of breath.   Cardiovascular: Negative for chest pain or palpitations.  Gastrointestinal: Negative for abdominal pain, no bowel changes.  Musculoskeletal: Negative for gait problem or joint swelling.  Skin: Negative for rash.  Neurological: Negative for dizziness or headache.  No other specific complaints in a complete review of systems (except as listed in HPI above).   Objective  Vitals:   02/20/20 1001  BP: 132/84  Pulse: 78  Resp: 16  Temp: 98.1 F (36.7 C)  TempSrc: Oral  SpO2: 95%  Weight: 212 lb 11.2 oz (96.5 kg)  Height: 5\' 7"  (1.702 m)    Body mass index is 33.31 kg/m.  Physical Exam  Constitutional: Patient appears well-developed  and well-nourished. Obese  No distress.  HEENT: head atraumatic, normocephalic, pupils equal and reactive to light,neck supple Cardiovascular: Normal rate, regular rhythm and normal heart sounds.  No murmur heard. No BLE edema. Pulmonary/Chest: Effort normal and breath sounds normal. No respiratory distress. Abdominal: Soft.  There is no tenderness. Psychiatric: Patient has a normal mood and affect. behavior is normal. Judgment and thought content normal.   Recent Results (from the past 2160 hour(s))  POCT HgB A1C     Status: Abnormal   Collection Time: 02/20/20 10:15 AM  Result Value Ref Range   Hemoglobin A1C 6.1 (A) 4.0 - 5.6 %   HbA1c POC (<> result, manual entry)     HbA1c, POC (prediabetic range)     HbA1c, POC (controlled diabetic range)       PHQ2/9: Depression screen [facility] 2/9 02/20/2020 11/21/2019 11/21/2019 08/21/2019 05/21/2019  Decreased Interest 0 0 0 0 0  Down, Depressed, Hopeless 0 0 0 0 0  PHQ - 2 Score 0 0 0 0 0  Altered sleeping - - - 0 0  Tired, decreased energy - - - 0 0  Change in appetite - - - 0 0  Feeling bad or failure about yourself  - - - 0 0  Trouble concentrating - - - 0 0  Moving slowly or fidgety/restless - - - 0 0  Suicidal thoughts - - - 0 0  PHQ-9 Score - - - 0 0  Difficult doing work/chores - - - - -  Some recent data might be hidden    phq 9 is negative   Fall Risk: Fall Risk  02/20/2020 11/21/2019 08/21/2019 05/21/2019 04/10/2019  Falls in the past year? 0 0 0 0 0  Number falls in past yr: 0 0 0 0 0  Injury with Fall? 0 0 0 0 0  Follow up Falls evaluation completed - - - -     Functional Status Survey: Is the patient deaf or have difficulty hearing?: No Does the patient have difficulty seeing, even when wearing glasses/contacts?: Yes Does the patient have difficulty concentrating, remembering, or making decisions?: No Does the patient have difficulty walking or climbing stairs?: No Does the patient have difficulty dressing or bathing?: No Does  the patient have difficulty doing  errands alone such as visiting a doctor's office or shopping?: No   Assessment & Plan  1. Controlled type 2 diabetes mellitus with microalbuminuria, without long-term current use of insulin (HCC)  - POCT HgB A1C - sitaGLIPtin (JANUVIA) 25 MG tablet; Take 1 tablet (25 mg total) by mouth daily.  Dispense: 90 tablet; Refill: 1  2. Chronic kidney disease, stage 4 (severe) (HCC)   3. Thoracic aortic atherosclerosis (HCC)  - rosuvastatin (CRESTOR) 40 MG tablet; Take 1 tablet (40 mg total) by mouth daily.  Dispense: 90 tablet; Refill: 1  4. Hyperparathyroidism due to renal insufficiency ([place])  Under the care of nephrologist   5. Vitamin D deficiency  Taking supplementation   6. Dyslipidemia  Changing statin therapy   7. B12 deficiency  She needs to resume to take it a few times a week   8. Controlled gout  - febuxostat (ULORIC) 40 MG tablet; Take 1 tablet (40 mg total) by mouth daily. In place of Allopurinol for gout  Dispense: 90 tablet; Refill: 1  9. Hypertension associated with type 2 diabetes mellitus ([place])   at goal   10. Mild persistent reactive airway disease without complication  - albuterol (VENTOLIN HFA) 108 (90 Base) MCG/ACT inhaler; Inhale 2 puffs into the lungs every 6 (six) hours as needed for wheezing or shortness of breath.  Dispense: 18 g; Refill: 0

## 2020-02-20 ENCOUNTER — Encounter: Payer: Self-pay | Admitting: Family Medicine

## 2020-02-20 ENCOUNTER — Ambulatory Visit (INDEPENDENT_AMBULATORY_CARE_PROVIDER_SITE_OTHER): Payer: Commercial Managed Care - PPO | Admitting: Family Medicine

## 2020-02-20 ENCOUNTER — Other Ambulatory Visit: Payer: Self-pay

## 2020-02-20 VITALS — BP 132/84 | HR 78 | Temp 98.1°F | Resp 16 | Ht 67.0 in | Wt 212.7 lb

## 2020-02-20 DIAGNOSIS — E1159 Type 2 diabetes mellitus with other circulatory complications: Secondary | ICD-10-CM

## 2020-02-20 DIAGNOSIS — E1129 Type 2 diabetes mellitus with other diabetic kidney complication: Secondary | ICD-10-CM | POA: Diagnosis not present

## 2020-02-20 DIAGNOSIS — E559 Vitamin D deficiency, unspecified: Secondary | ICD-10-CM

## 2020-02-20 DIAGNOSIS — N2581 Secondary hyperparathyroidism of renal origin: Secondary | ICD-10-CM | POA: Diagnosis not present

## 2020-02-20 DIAGNOSIS — J4530 Mild persistent asthma, uncomplicated: Secondary | ICD-10-CM

## 2020-02-20 DIAGNOSIS — N184 Chronic kidney disease, stage 4 (severe): Secondary | ICD-10-CM | POA: Diagnosis not present

## 2020-02-20 DIAGNOSIS — E538 Deficiency of other specified B group vitamins: Secondary | ICD-10-CM

## 2020-02-20 DIAGNOSIS — I152 Hypertension secondary to endocrine disorders: Secondary | ICD-10-CM

## 2020-02-20 DIAGNOSIS — I700 Atherosclerosis of aorta: Secondary | ICD-10-CM | POA: Diagnosis not present

## 2020-02-20 DIAGNOSIS — R809 Proteinuria, unspecified: Secondary | ICD-10-CM

## 2020-02-20 DIAGNOSIS — E785 Hyperlipidemia, unspecified: Secondary | ICD-10-CM

## 2020-02-20 DIAGNOSIS — M109 Gout, unspecified: Secondary | ICD-10-CM

## 2020-02-20 LAB — POCT GLYCOSYLATED HEMOGLOBIN (HGB A1C): Hemoglobin A1C: 6.1 % — AB (ref 4.0–5.6)

## 2020-02-20 MED ORDER — SITAGLIPTIN PHOSPHATE 25 MG PO TABS: 25.0000 mg | ORAL_TABLET | Freq: Every day | ORAL | 1 refills | Status: DC

## 2020-02-20 MED ORDER — FEBUXOSTAT 40 MG PO TABS: 40.0000 mg | ORAL_TABLET | Freq: Every day | ORAL | 1 refills | Status: DC

## 2020-02-20 MED ORDER — ALBUTEROL SULFATE HFA 108 (90 BASE) MCG/ACT IN AERS
2.0000 | INHALATION_SPRAY | Freq: Four times a day (QID) | RESPIRATORY_TRACT | 0 refills | Status: DC | PRN
Start: 2020-02-20 — End: 2021-07-06

## 2020-02-20 MED ORDER — ROSUVASTATIN CALCIUM 40 MG PO TABS: 40.0000 mg | ORAL_TABLET | Freq: Every day | ORAL | 1 refills | Status: DC

## 2020-04-11 ENCOUNTER — Other Ambulatory Visit: Payer: Self-pay | Admitting: Family Medicine

## 2020-04-11 DIAGNOSIS — R809 Proteinuria, unspecified: Secondary | ICD-10-CM

## 2020-04-11 DIAGNOSIS — E1129 Type 2 diabetes mellitus with other diabetic kidney complication: Secondary | ICD-10-CM

## 2020-05-05 ENCOUNTER — Encounter: Payer: Self-pay | Admitting: Family Medicine

## 2020-05-05 ENCOUNTER — Telehealth (INDEPENDENT_AMBULATORY_CARE_PROVIDER_SITE_OTHER): Payer: Commercial Managed Care - PPO | Admitting: Family Medicine

## 2020-05-05 DIAGNOSIS — J069 Acute upper respiratory infection, unspecified: Secondary | ICD-10-CM

## 2020-05-05 MED ORDER — BENZONATATE 200 MG PO CAPS: 200.0000 mg | ORAL_CAPSULE | Freq: Two times a day (BID) | ORAL | 0 refills | Status: DC | PRN

## 2020-05-05 NOTE — Patient Instructions (Signed)
You have a cold and it should start to get better about 7 - 10 days after it started.    For your cough, try the tessalon perles or a tablespoon of honey.  For your nasal congestion and runny nose, try using Afrin (generic is Oxymetazoline) twice daily for 3 days.  Do not use for longer that 3 days.    Some other therapies you can try are: push fluids, rest and use vaporizer or mist as needed.   Drinking warm liquids such as teas and soups can help with secretions and cough. A mist humidifier or vaporizer can work well to help with secretions and cough.  It is very important to clean the humidifier between use according to the instructions.    If you're still having trouble in the next week, come back and see [place].    Of course, if you start having trouble breathing, worsening fevers, vomiting and unable to hold down any fluids, or you have other concerns, don't hesitate to come back or go to the ED after hours.

## 2020-05-05 NOTE — Progress Notes (Signed)
Virtual Visit via Video Note  I connected with Cheryl Barr on 05/05/20 at 10:20 AM EDT by a video enabled telemedicine application and verified that I am speaking with the correct person using two identifiers.  Location: Patient: home Provider: [facility]   I discussed the limitations of evaluation and management by telemedicine and the availability of in person appointments. The patient expressed understanding and agreed to proceed.  History of Present Illness:  UPPER RESPIRATORY TRACT INFECTION - dry cough x5 days, ear fullness. - works at [employer] home, tested for [place] negative. Needs note to return to work on Thursday. - feels ok now but still with ear congestion.  - taking albuterol as needed.  Worst symptom: Fever: no Cough: yes, slightly productive Shortness of breath: no Wheezing: no Chest pain: no Chest tightness: no Chest congestion: yes Nasal congestion: yes Runny nose: no Sneezing: no Sore throat: no Ear pain: no  Ear pressure: yes "right Eyes red/itching:no Eye drainage/crusting: no  Vomiting: no Context: better Recurrent sinusitis: no Relief with OTC cold/cough medications: yes  Treatments attempted: mucinex   Observations/Objective:  Patient had trouble connecting to video visit, entirety of visit conducted over the phone.  Speaks in full sentences, no respiratory distress.   Assessment and Plan:  Viral URI Doing well with mild sx. COVID negative. Reviewed OTC symptom relief and emergency precautions. Note provided for work. Rx tessalon.     I discussed the assessment and treatment plan with the patient. The patient was provided an opportunity to ask questions and all were answered. The patient agreed with the plan and demonstrated an understanding of the instructions.   The patient was advised to call back or seek an in-person evaluation if the symptoms worsen or if the condition fails to improve as anticipated.  I provided 7 minutes  of non-face-to-face time during this encounter.   Myles Gip, DO

## 2020-05-08 IMAGING — CR DG CHEST 2V
2 series · 2 of 2 positions shown · non-contrast
Comparison: April 06, 2017

CLINICAL DATA: Fever, tired and weakness

EXAM:
CHEST - 2 VIEW

[chest pa]
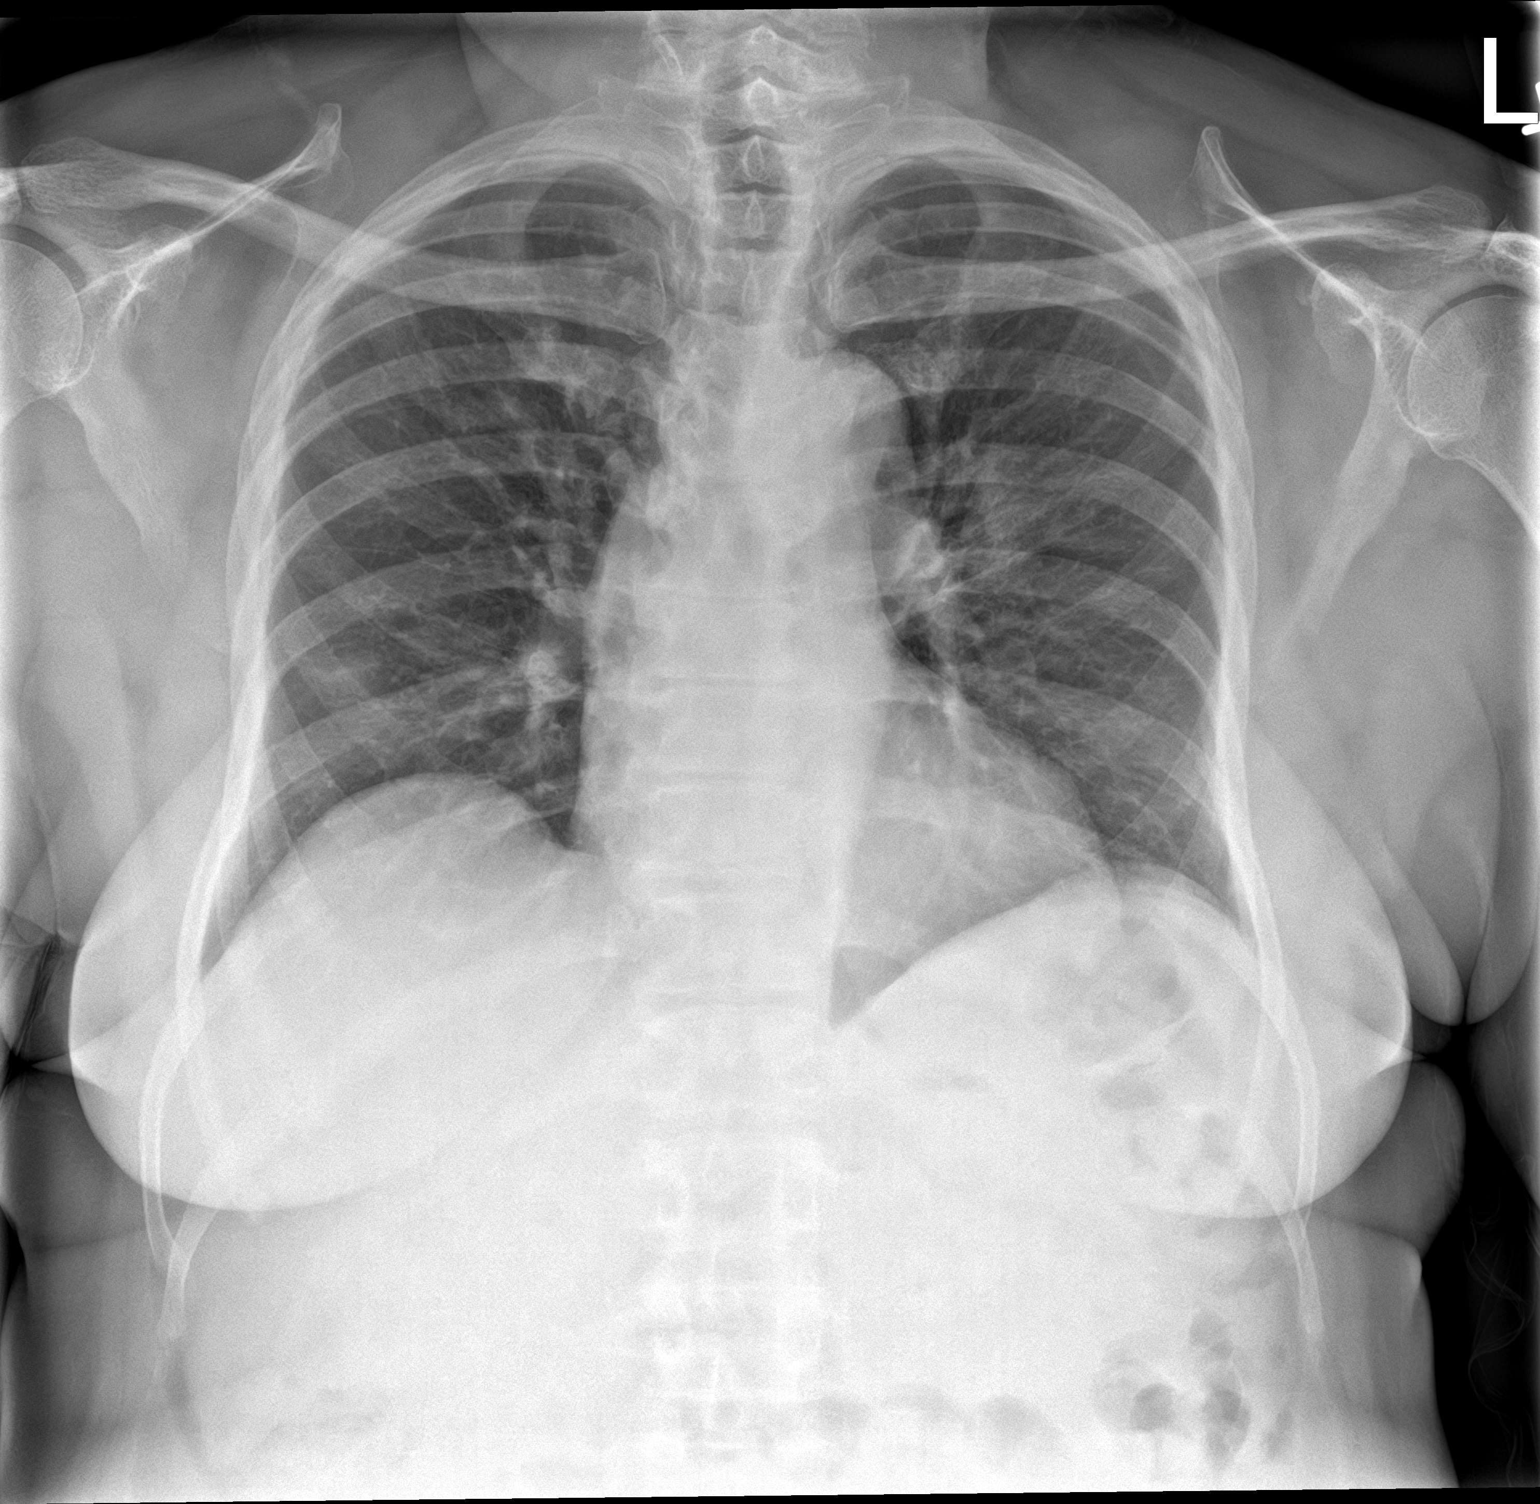

[chest lat]
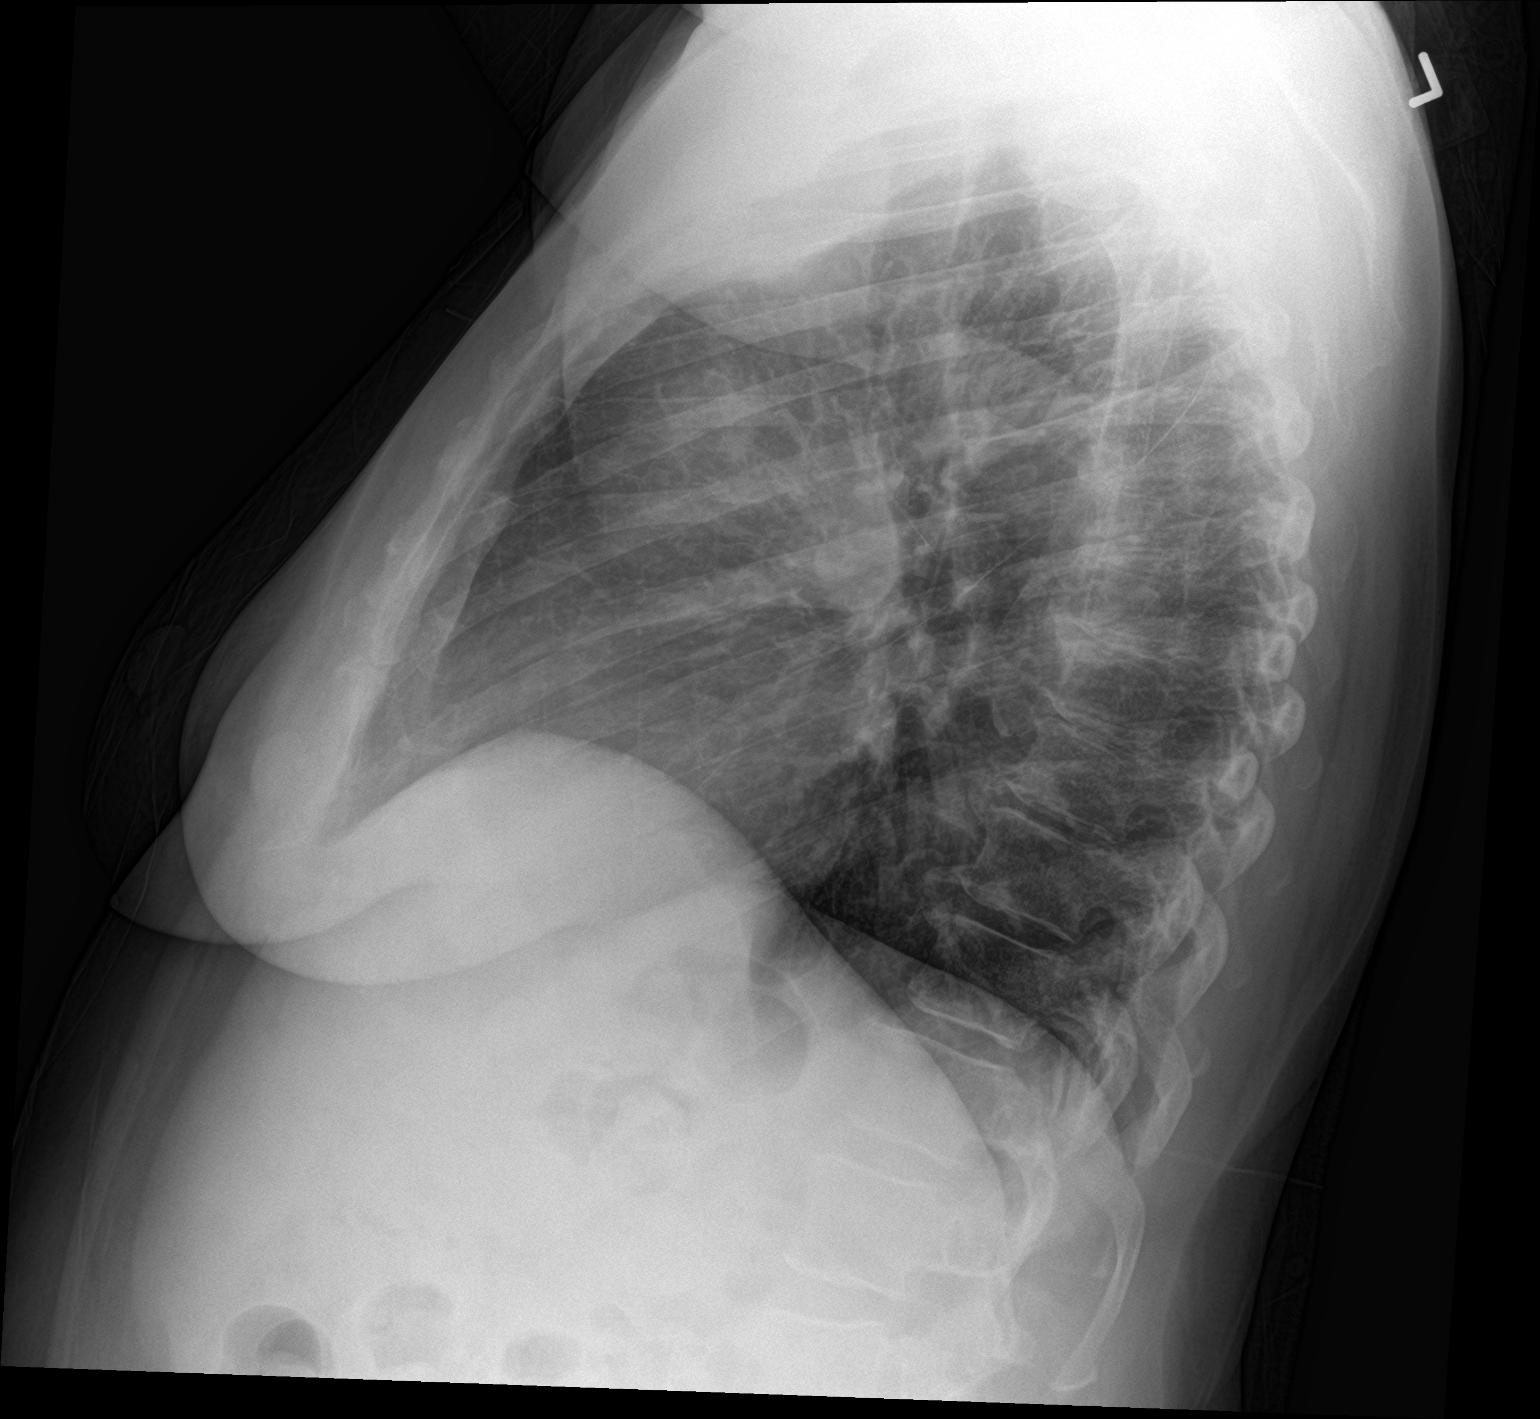

[2 of 2 positions shown; findings below may reference images not displayed]

FINDINGS: The heart size and mediastinal contours are within normal limits.
There is mildly increased hazy airspace opacity seen within the
right lower lung and the left upper lobe. No large airspace
consolidation or pleural effusion. The visualized skeletal
structures are unremarkable.
IMPRESSION: Mild hazy airspace opacities in the left upper lobe and right lower
lung which could be due to atelectasis and/or early infectious
etiology.

## 2020-05-19 NOTE — Progress Notes (Signed)
Name: Cheryl Barr   MRN: [id number]    DOB: 03-12-1955   Date:05/20/2020       Progress Note  Subjective  Chief Complaint  Follow up   HPI   HTN: compliant with medication, Azor,and carvedilol. Shedenies side effects, no chest pain, palpitationbut has some SOB but stable. No dizziness. BP at goal today , continue current medication   Hyperlipidemia/Atherosclerosis of aorta :LDL at goal done 11/2018, on Atorvastatin 40mg   and denies myalgias. Last LDL was 116 goal is below  70 , she is now on Crestor and discussed rechecking labs today but she would like to hold off at this time since she has to go shopping for brother's funeral this weekend  DMII with CKI stage IV and also HTN: urine micro was 100 , n , kidney biopsy showed glomerulonephritis She is onaspirin,  she has symptoms of meralgia paresthetica on right side for a long time, she was on Lyrica and stopped taking it again, states medication does not work but pain has been constant, burning like and would like something to help with discomfort. .She denies polyphagia, but has noticed polydipsia  she has nocturia. A1C has been controlled, today is 6.3 %  on low dose Januvia.   She is on ARB ( Azor ) continue healthy diet   Gout: . She states symptoms are controlled, she is on Uloric now, off Allopurinol .   Urinary frequency: she used to have most of her symptoms during the night, she still  wakes up multiple times to void during the night, but now also seems to have increase in frequency during the day.. She snores , she feels tired when she wakes up in the morning, but denies morning headaches. Discussed sleep study to rule out OSA but ESS was normal in the past. She states since CKI she has been drinking a lot of water. She is not interested in more medications or seeing urologist at this time  TDD:UK[facility] by Dr. 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 We will try Trelegy since [facility] was too expensive and we have vouchers . We will also add tussionex to take qhs for now   Atherosclerosis of aorta: continue Crestor, and also on baby aspirin daily   Vitamin B12 deficiency:she is taking otc supplementation   Chronic kidney disease: she is seeing Dr. 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 No pruritis, normal urine output . Reviewed last labs.   Patient Active Problem List   Diagnosis Date Noted  . Hyperparathyroidism due to renal insufficiency ([place]) 06/12/2019  . 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 Chronic kidney disease, stage 4 (severe) ([place]) 06/12/2019  . Anemia in chronic kidney disease 06/12/2019  . COVID-19 virus detected 03/29/2019  . 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 Incomplete rotator cuff tear or rupture of left shoulder, not specified as traumatic 07/26/2016  . Thoracic aortic atherosclerosis ([place]) 01/12/2016  . 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 B12 deficiency 01/11/2016  . Hernia of abdominal cavity 09/15/2014  . Impaired renal function 08/10/2014  . 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 Dyslipidemia 08/10/2014  . Interval gout 08/10/2014  . Microalbuminuria 08/10/2014  . Osteoarthritis of left knee 08/10/2014  . Vitamin D deficiency 08/10/2014  .  Cough 01/21/2014  . Intraductal papilloma of breast 06/27/2013  . Hypertension, essential, benign 09/02/2009  . ABNORMAL EKG 09/02/2009    Past Surgical History:  Procedure Laterality Date  . BREAST BIOPSY Left 05/24/2013   neg  . BREAST EXCISIONAL BIOPSY Left 06/21/2013   neg/ dr. Bary Castilla  . BREAST SURGERY Left 2015  . COLONOSCOPY  2009   1 polyp found. Dr. Roselyn Reef  . HERNIA REPAIR  27/74/1287   Ventral/umbilical  hernia repair with 10 x 10 cm Atrium mesh in preperitoneal position.  . INSERTION OF MESH N/A 10/09/2014   Procedure: INSERTION OF MESH;  Surgeon: Robert Bellow, MD;  Location: [facility] ORS;  Service: General;  Laterality: N/A;  . KNEE SURGERY Left 01/14/2009  . STRABISMUS SURGERY     Repair as a child  . TUBAL LIGATION  1990  . VENTRAL HERNIA REPAIR N/A 10/09/2014   Procedure: HERNIA REPAIR VENTRAL ADULT;  Surgeon: Robert Bellow, MD;  Location: [facility] ORS;  Service: General;  Laterality: N/A;    Family History  Problem Relation Age of Onset  . Hypertension Mother   . Diabetes Mother   . Heart attack Father   . Coronary artery disease Father   . Breast cancer Father 15  . Diabetes Sister   . Gout Brother   . Heart disease Brother   . Allergies Son        Barista    Social History   Tobacco Use  . Smoking status: Former Smoker    Types: Cigarettes    Quit date: 10/02/1983    Years since quitting: 36.6  . Smokeless tobacco: Never Used  Substance Use Topics  . Alcohol use: No    Alcohol/week: 0.0 standard drinks     Current Outpatient Medications:  .  aspirin 81 MG EC tablet, Take 81 mg by mouth daily., Disp: , Rfl:  .  chlorpheniramine-HYDROcodone (TUSSIONEX PENNKINETIC ER) 10-8 MG/5ML SUER, Take 5 mLs by mouth at bedtime as needed., Disp: 140 mL, Rfl: 0 .  febuxostat (ULORIC) 40 MG tablet, Take 1 tablet (40 mg total) by mouth daily. In place of Allopurinol for gout, Disp: 90 tablet, Rfl: 1 .  fluticasone (FLONASE) 50 MCG/ACT nasal spray, Place 2 sprays into both nostrils daily., Disp: 16 g, Rfl: 6 .  Fluticasone-Umeclidin-Vilant (TRELEGY ELLIPTA) 100-62.5-25 MCG/INH AEPB, Inhale 1 puff into the lungs daily., Disp: 1 each, Rfl: 11 .  rosuvastatin (CRESTOR) 40 MG tablet, Take 1 tablet (40 mg total) by mouth daily., Disp: 90 tablet, Rfl: 1 .  sitaGLIPtin (JANUVIA) 25 MG tablet, Take 1 tablet (25 mg total) by mouth daily., Disp: 90 tablet, Rfl: 1 .  albuterol  (VENTOLIN HFA) 108 (90 Base) MCG/ACT inhaler, Inhale 2 puffs into the lungs every 6 (six) hours as needed for wheezing or shortness of breath. (Patient not taking: Reported on 05/20/2020), Disp: 18 g, Rfl: 0 .  amLODipine-olmesartan (AZOR) 10-40 MG tablet, Take 1 tablet by mouth daily., Disp: 90 tablet, Rfl: 1 .  benzonatate (TESSALON) 200 MG capsule, Take 1 capsule (200 mg total) by mouth 2 (two) times daily as needed for cough. (Patient not taking: Reported on 05/20/2020), Disp: 20 capsule, Rfl: 0 .  carvedilol (COREG) 12.5 MG tablet, Take 1 tablet (12.5 mg total) by mouth 2 (two) times daily with a meal., Disp: 180 tablet, Rfl: 1 .  Vitamin D, Ergocalciferol, (DRISDOL) 1.25 MG (50000 UNIT) CAPS capsule, Take 1 capsule (50,000 Units total) by mouth every 7 (seven) days.,  Disp: 12 capsule, Rfl: 1  No Known Allergies  I personally reviewed active problem list, medication list, allergies, family history, social history, health maintenance with the patient/caregiver today.   ROS  Constitutional: Negative for fever or significant  weight change.  Respiratory: positive  for cough but no  shortness of breath.   Cardiovascular: Negative for chest pain or palpitations.  Gastrointestinal: Negative for abdominal pain, no bowel changes.  Musculoskeletal: Negative for gait problem or joint swelling.  Skin: Negative for rash.  Neurological: Negative for dizziness or headache.  No other specific complaints in a complete review of systems (except as listed in HPI above).  Objective  Vitals:   05/20/20 1520  BP: 128/72  Pulse: 90  Resp: 16  Temp: 98.9 F (37.2 C)  TempSrc: Oral  SpO2: 98%  Weight: 209 lb (94.8 kg)  Height: 5\' 7"  (1.702 m)    Body mass index is 32.73 kg/m.  Physical Exam  Constitutional: Patient appears well-developed and well-nourished. Obese  No distress.  HEENT: head atraumatic, normocephalic, pupils equal and reactive to light, ears normal TM bilaterally , neck supple,  throat within normal limits Cardiovascular: Normal rate, regular rhythm and normal heart sounds.  No murmur heard. No BLE edema. Pulmonary/Chest: Effort normal and breath sounds normal. No respiratory distress. Abdominal: Soft.  There is no tenderness. Psychiatric: Patient has a normal mood and affect. behavior is normal. Judgment and thought content normal.  Recent Results (from the past 2160 hour(s))  POCT HgB A1C     Status: Abnormal   Collection Time: 05/20/20  3:33 PM  Result Value Ref Range   Hemoglobin A1C 6.3 (A) 4.0 - 5.6 %   HbA1c POC (<> result, manual entry)     HbA1c, POC (prediabetic range)     HbA1c, POC (controlled diabetic range)       PHQ2/9: Depression screen [facility] 2/9 05/20/2020 05/05/2020 02/20/2020 11/21/2019 11/21/2019  Decreased Interest 0 0 0 0 0  Down, Depressed, Hopeless 0 0 0 0 0  PHQ - 2 Score 0 0 0 0 0  Altered sleeping - - - - -  Tired, decreased energy - - - - -  Change in appetite - - - - -  Feeling bad or failure about yourself  - - - - -  Trouble concentrating - - - - -  Moving slowly or fidgety/restless - - - - -  Suicidal thoughts - - - - -  PHQ-9 Score - - - - -  Difficult doing work/chores - - - - -  Some recent data might be hidden    phq 9 is negative   Fall Risk: Fall Risk  05/20/2020 05/05/2020 02/20/2020 11/21/2019 08/21/2019  Falls in the past year? 0 0 0 0 0  Number falls in past yr: 0 0 0 0 0  Injury with Fall? 0 0 0 0 0  Follow up - - Falls evaluation completed - -     Functional Status Survey: Is the patient deaf or have difficulty hearing?: No Does the patient have difficulty seeing, even when wearing glasses/contacts?: Yes Does the patient have difficulty concentrating, remembering, or making decisions?: No Does the patient have difficulty walking or climbing stairs?: No Does the patient have difficulty dressing or bathing?: No Does the patient have difficulty doing errands alone such as visiting a doctor's office or shopping?:  No    Assessment & Plan  1. Controlled type 2 diabetes mellitus with microalbuminuria, without long-term current use of insulin (  [place])  - POCT HgB A1C  2. Dyslipidemia   3. Vitamin D deficiency  - Vitamin D, Ergocalciferol, (DRISDOL) 1.25 MG (50000 UNIT) CAPS capsule; Take 1 capsule (50,000 Units total) by mouth every 7 (seven) days.  Dispense: 12 capsule; Refill: 1  4. Hyperparathyroidism due to renal insufficiency ([place])   5. Thoracic aortic atherosclerosis ([place])   6. Chronic kidney disease, stage 4 (severe) ([facility])  - amLODipine-olmesartan (AZOR) 10-40 MG tablet; Take 1 tablet by mouth daily.  Dispense: 90 tablet; Refill: 1  7. Colon cancer screening  - Cologuard  8. B12 deficiency   9. Controlled gout   10. Hypertension associated with type 2 diabetes mellitus (H[facility])  - amLODipine-olmesartan (AZOR) 10-40 MG tablet; Take 1 tablet by mouth daily.  Dispense: 90 tablet; Refill: 1 - carvedilol (COREG) 12.5 MG tablet; Take 1 tablet (12.5 mg total) by mouth 2 (two) times daily with a meal.  Dispense: 180 tablet; Refill: 1  11. Moderate persistent reactive airway disease without complication  - Fluticasone-Umeclidin-Vilant (TRELEGY ELLIPTA) 100-62.5-25 MCG/INH AEPB; Inhale 1 puff into the lungs daily.  Dispense: 1 each; Refill: 11 - chlorpheniramine-HYDROcodone (TUSSIONEX PENNKINETIC ER) 10-8 MG/5ML SUER; Take 5 mLs by mouth at bedtime as needed.  Dispense: 140 mL; Refill: 0  12. Ear fullness, right  Normal exam, discussed eusthachian tube dysfunction, she will resume flonase daily   13. Hypertension, essential, benign  - amLODipine-olmesartan (AZOR) 10-40 MG tablet; Take 1 tablet by mouth daily.  Dispense: 90 tablet; Refill: 1 - carvedilol (COREG) 12.5 MG tablet; Take 1 tablet (12.5 mg total) by mouth 2 (two) times daily with a meal.  Dispense: 180 tablet; Refill: 1  14. Focal segmental glomerulosclerosis

## 2020-05-20 ENCOUNTER — Ambulatory Visit: Payer: Commercial Managed Care - PPO | Admitting: Family Medicine

## 2020-05-20 ENCOUNTER — Other Ambulatory Visit: Payer: Self-pay

## 2020-05-20 ENCOUNTER — Encounter: Payer: Self-pay | Admitting: Family Medicine

## 2020-05-20 VITALS — BP 128/72 | HR 90 | Temp 98.9°F | Resp 16 | Ht 67.0 in | Wt 209.0 lb

## 2020-05-20 DIAGNOSIS — I10 Essential (primary) hypertension: Secondary | ICD-10-CM

## 2020-05-20 DIAGNOSIS — N2581 Secondary hyperparathyroidism of renal origin: Secondary | ICD-10-CM

## 2020-05-20 DIAGNOSIS — H938X1 Other specified disorders of right ear: Secondary | ICD-10-CM

## 2020-05-20 DIAGNOSIS — N184 Chronic kidney disease, stage 4 (severe): Secondary | ICD-10-CM

## 2020-05-20 DIAGNOSIS — E1159 Type 2 diabetes mellitus with other circulatory complications: Secondary | ICD-10-CM

## 2020-05-20 DIAGNOSIS — M109 Gout, unspecified: Secondary | ICD-10-CM

## 2020-05-20 DIAGNOSIS — I700 Atherosclerosis of aorta: Secondary | ICD-10-CM

## 2020-05-20 DIAGNOSIS — J45909 Unspecified asthma, uncomplicated: Secondary | ICD-10-CM | POA: Insufficient documentation

## 2020-05-20 DIAGNOSIS — E785 Hyperlipidemia, unspecified: Secondary | ICD-10-CM

## 2020-05-20 DIAGNOSIS — R809 Proteinuria, unspecified: Secondary | ICD-10-CM | POA: Diagnosis not present

## 2020-05-20 DIAGNOSIS — E538 Deficiency of other specified B group vitamins: Secondary | ICD-10-CM

## 2020-05-20 DIAGNOSIS — N051 Unspecified nephritic syndrome with focal and segmental glomerular lesions: Secondary | ICD-10-CM

## 2020-05-20 DIAGNOSIS — E559 Vitamin D deficiency, unspecified: Secondary | ICD-10-CM

## 2020-05-20 DIAGNOSIS — Z1211 Encounter for screening for malignant neoplasm of colon: Secondary | ICD-10-CM

## 2020-05-20 DIAGNOSIS — I152 Hypertension secondary to endocrine disorders: Secondary | ICD-10-CM

## 2020-05-20 DIAGNOSIS — E1129 Type 2 diabetes mellitus with other diabetic kidney complication: Secondary | ICD-10-CM

## 2020-05-20 DIAGNOSIS — J4540 Moderate persistent asthma, uncomplicated: Secondary | ICD-10-CM

## 2020-05-20 LAB — POCT GLYCOSYLATED HEMOGLOBIN (HGB A1C): Hemoglobin A1C: 6.3 % — AB (ref 4.0–5.6)

## 2020-05-20 MED ORDER — AMLODIPINE-OLMESARTAN 10-40 MG PO TABS: 1.0000 | ORAL_TABLET | Freq: Every day | ORAL | 1 refills | Status: DC

## 2020-05-20 MED ORDER — TRELEGY ELLIPTA 100-62.5-25 MCG/INH IN AEPB: 1.0000 | INHALATION_SPRAY | Freq: Every day | RESPIRATORY_TRACT | 11 refills | Status: DC

## 2020-05-20 MED ORDER — VITAMIN D (ERGOCALCIFEROL) 1.25 MG (50000 UNIT) PO CAPS
50000.0000 [IU] | ORAL_CAPSULE | ORAL | 1 refills | Status: DC
Start: 2020-05-20 — End: 2020-08-10

## 2020-05-20 MED ORDER — CARVEDILOL 12.5 MG PO TABS: 12.5000 mg | ORAL_TABLET | Freq: Two times a day (BID) | ORAL | 1 refills | Status: DC

## 2020-05-20 MED ORDER — HYDROCOD POLST-CPM POLST ER 10-8 MG/5ML PO SUER
5.0000 mL | Freq: Every evening | ORAL | 0 refills | Status: DC | PRN
Start: 2020-05-20 — End: 2020-09-01

## 2020-08-02 ENCOUNTER — Telehealth: Payer: Self-pay | Admitting: Family Medicine

## 2020-08-02 DIAGNOSIS — I10 Essential (primary) hypertension: Secondary | ICD-10-CM

## 2020-08-02 DIAGNOSIS — I152 Hypertension secondary to endocrine disorders: Secondary | ICD-10-CM

## 2020-08-02 DIAGNOSIS — E1159 Type 2 diabetes mellitus with other circulatory complications: Secondary | ICD-10-CM

## 2020-08-02 NOTE — Telephone Encounter (Signed)
last RF 05/20/20 #180 1 RF

## 2020-08-06 ENCOUNTER — Other Ambulatory Visit: Payer: Self-pay

## 2020-08-06 DIAGNOSIS — I10 Essential (primary) hypertension: Secondary | ICD-10-CM

## 2020-08-06 DIAGNOSIS — E1159 Type 2 diabetes mellitus with other circulatory complications: Secondary | ICD-10-CM

## 2020-08-06 DIAGNOSIS — I152 Hypertension secondary to endocrine disorders: Secondary | ICD-10-CM

## 2020-08-06 NOTE — Telephone Encounter (Unsigned)
Copied from [place] [phone]. Topic: General - Other >> Aug 06, 2020 11:47 AM Bayard Beaver wrote: [facility] called for refill of  Carvedilol 12.5 MG , says is completly out, gave info about too soon to refill. Please call back to advise if short supply can be done

## 2020-08-10 ENCOUNTER — Other Ambulatory Visit: Payer: Self-pay | Admitting: Family Medicine

## 2020-08-10 DIAGNOSIS — E1159 Type 2 diabetes mellitus with other circulatory complications: Secondary | ICD-10-CM

## 2020-08-10 DIAGNOSIS — E559 Vitamin D deficiency, unspecified: Secondary | ICD-10-CM

## 2020-08-10 DIAGNOSIS — I10 Essential (primary) hypertension: Secondary | ICD-10-CM

## 2020-08-10 MED ORDER — CARVEDILOL 12.5 MG PO TABS: 12.5000 mg | ORAL_TABLET | Freq: Two times a day (BID) | ORAL | 1 refills | Status: DC

## 2020-08-10 NOTE — Telephone Encounter (Signed)
Requested medication (s) are due for refill today: yes  Requested medication (s) are on the active medication list: yes  Last refill:  05/20/20 #12 1 refill  Future visit scheduled: yes in 3 weeks   Notes to clinic:  not delegated per protocol     Requested Prescriptions  Pending Prescriptions Disp Refills   Vitamin D, Ergocalciferol, (DRISDOL) 1.25 MG (50000 UNIT) CAPS capsule 12 capsule 1    Sig: Take 1 capsule (50,000 Units total) by mouth every 7 (seven) days.      Endocrinology:  Vitamins - Vitamin D Supplementation Failed - 08/10/2020  4:40 PM      Failed - 50,000 IU strengths are not delegated      Failed - Phosphate in normal range and within 360 days    No results found for: PHOS        Passed - Ca in normal range and within 360 days    Calcium  Date Value Ref Range Status  11/21/2019 9.5 8.6 - 10.4 mg/dL Final  11/21/2019 9.5 8.6 - 10.4 mg/dL Final   Calcium, Total  Date Value Ref Range Status  06/17/2013 9.6 8.5 - 10.1 mg/dL Final          Passed - Vitamin D in normal range and within 360 days    Vit D, 25-Hydroxy  Date Value Ref Range Status  11/21/2019 59 30 - 100 ng/mL Final    Comment:    Vitamin D Status         25-OH Vitamin D: . Deficiency:                    <20 ng/mL Insufficiency:             20 - 29 ng/mL Optimal:                 > or = 30 ng/mL . For 25-OH Vitamin D testing on patients on  D2-supplementation and patients for whom quantitation  of D2 and D3 fractions is required, the [employer](TM) 25-OH VIT D, (D2,D3), LC/MS/MS is recommended: order  code [id number] (patients >92yrs). See Note 1 . Note 1 . For additional information, please refer to  http://education.[facility].com/faq/FAQ199  (This link is being provided for informational/ educational purposes only.)           Passed - Valid encounter within last 12 months    Recent Outpatient Visits           2 months ago Controlled type 2 diabetes mellitus with  microalbuminuria, without long-term current use of insulin [facility])   [facility] Steele Sizer, MD   3 months ago Viral URI   [facility] Rory Percy M, DO   5 months ago Controlled type 2 diabetes mellitus with microalbuminuria, without long-term current use of insulin [facility])   [facility] Steele Sizer, MD   8 months ago Thoracic aortic atherosclerosis [facility])   [facility] Steele Sizer, MD   11 months ago Controlled type 2 diabetes mellitus with microalbuminuria, without long-term current use of insulin [facility])   [facility] Steele Sizer, MD       Future Appointments             In 3 weeks Steele Sizer, MD [facility], [place]   In 3 months Steele Sizer, MD [place]  Signed Prescriptions Disp Refills   carvedilol (COREG) 12.5 MG tablet 180 tablet 1    Sig: Take 1 tablet (12.5 mg total) by mouth 2 (two) times daily with a meal.      Cardiovascular:  Beta Blockers Passed - 08/10/2020  4:40 PM      Passed - Last BP in normal range    BP Readings from Last 1 Encounters:  05/20/20 128/72          Passed - Last Heart Rate in normal range    Pulse Readings from Last 1 Encounters:  05/20/20 90          Passed - Valid encounter within last 6 months    Recent Outpatient Visits           2 months ago Controlled type 2 diabetes mellitus with microalbuminuria, without long-term current use of insulin [facility])   [facility] Steele Sizer, MD   3 months ago Viral URI   [facility] Rory Percy M, DO   5 months ago Controlled type 2 diabetes mellitus with microalbuminuria, without long-term current use of insulin [facility])   [facility] Steele Sizer, MD   8 months ago Thoracic aortic atherosclerosis [facility])   [facility] Steele Sizer, MD   11 months ago Controlled type 2 diabetes mellitus with microalbuminuria, without long-term current use of insulin [facility])   [facility] Steele Sizer, MD       Future Appointments             In 3 weeks Steele Sizer, MD [facility], [place]   In 3 months Steele Sizer, MD [facility], [facility]

## 2020-08-10 NOTE — Telephone Encounter (Signed)
Future in 3 weeks

## 2020-08-10 NOTE — Telephone Encounter (Signed)
Copied from [place] [phone]. Topic: Quick Communication - Rx Refill/Question >> Aug 10, 2020  4:34 PM Lenon Curt, Everette A wrote: Medication: carvedilol (COREG) 12.5 MG tablet   Vitamin D, Ergocalciferol, (DRISDOL) 1.25 MG (50000 UNIT) CAPS capsule   Has the patient contacted their pharmacy? Yes.   (Agent: If no, request that the patient contact the pharmacy for the refill.) (Agent: If yes, when and what did the pharmacy advise?)  Preferred Pharmacy (with phone number or street name): [facility] [address], [place] [address] [place] [address] Phone: [phone] Fax: [phone]    Agent: Please be advised that RX refills may take up to 3 business days. We ask that you follow-up with your pharmacy.

## 2020-08-11 MED ORDER — VITAMIN D (ERGOCALCIFEROL) 1.25 MG (50000 UNIT) PO CAPS: 50000.0000 [IU] | ORAL_CAPSULE | ORAL | 1 refills | Status: DC

## 2020-08-11 NOTE — Telephone Encounter (Signed)
Pt has an appt on 09/01/20

## 2020-08-25 LAB — HM DIABETES EYE EXAM

## 2020-08-31 NOTE — Progress Notes (Addendum)
Name: Cheryl Barr   MRN: [id number]    DOB: 08/04/1955   Date:09/01/2020       Progress Note  Subjective  Chief Complaint  Follow Up  HPI  HTN: compliant with medication, Azor, and carvedilol.  She denies side effects, no chest pain, palpitation but has some SOB but stable. Denies orthostatic changes .  BP at goal today , continue current medication    Hyperlipidemia/Atherosclerosis of aorta : LDL at goal done 11/2018, on Atorvastatin 40mg   and denies myalgias . 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 Candiss Norse,  kidney biopsy showed glomerulonephritis She is on  aspirin,  she has symptoms of meralgia paresthetica on right side for a long time, she was on Lyrica and stopped taking it again because not efficatious. .She denies polyphagia, but has noticed polydipsia  she has nocturia. A1C has been controlled, today is 6.2  %  on low dose Januvia, but contacted Dr. Candiss Norse to see if okay to switch to [place] since it may help her kidneys, aware that on label not indicated for GFR below 30 but new studies shows good for kidney protection .   She is on ARB ( Azor ) continue healthy diet    Gout: . he is on Uloric now,, and under control   RAD: seen by Dr. 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 She has noticed worsening of cough  lately, she wakes up and mild sputum production and also coughs a lot during the day.  She would like to resume an inhaler that is not too costl, we gave her a voucher for Trelegy   Atherosclerosis of aorta: continue Crestor, and also on baby aspirin daily , last LDL was not at goal.    Vitamin B12 deficiency:she is taking otc supplementation    Chronic kidney disease: she is seeing Dr. 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 No pruritis, normal urine output . Reviewed labs done by Dr. Candiss Norse , we will try low dose of Farxiga and monitor, discussed with Dr. 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   Incomplete rotator cuff tear or rupture of left shoulder, not specified as traumatic 07/26/2016   Thoracic aortic atherosclerosis ([place]) 01/12/2016   DDD (degenerative disc disease), 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/2016   B12 deficiency 01/11/2016   Hernia of abdominal cavity 09/15/2014   Impaired renal function 08/10/2014   Controlled type 2 diabetes with renal manifestation 08/10/2014   Dyslipidemia 08/10/2014   Interval gout 08/10/2014   Microalbuminuria 08/10/2014   Osteoarthritis of left knee 08/10/2014   Vitamin D deficiency 08/10/2014   Cough 01/21/2014   Intraductal papilloma of breast 06/27/2013   Hypertension, essential, benign 09/02/2009   ABNORMAL EKG 09/02/2009    Past Surgical History:  Procedure Laterality Date   BREAST BIOPSY Left 05/24/2013  neg   BREAST EXCISIONAL BIOPSY Left 06/21/2013   neg/ dr. Bary Castilla   BREAST SURGERY Left 2015   COLONOSCOPY  2009   1 polyp found. Dr. Roselyn Reef   HERNIA REPAIR  26/37/8588   Ventral/umbilical hernia repair with 10 x 10 cm Atrium mesh in preperitoneal position.   INSERTION OF MESH N/A 10/09/2014   Procedure: INSERTION OF MESH;  Surgeon: Robert Bellow, MD;  Location: [facility] ORS;  Service: General;  Laterality: N/A;   KNEE SURGERY Left 01/14/2009   STRABISMUS SURGERY     Repair as a child   TUBAL LIGATION  1990   VENTRAL HERNIA REPAIR N/A  10/09/2014   Procedure: HERNIA REPAIR VENTRAL ADULT;  Surgeon: Robert Bellow, MD;  Location: [facility] ORS;  Service: General;  Laterality: N/A;    Family History  Problem Relation Age of Onset   Hypertension Mother    Diabetes Mother    Heart attack Father    Coronary artery disease Father    Breast cancer Father 84   Diabetes Sister    Gout Brother    Heart disease Brother    Allergies Son        Haematologist and Geographical information systems officer    Social History   Tobacco Use   Smoking status: Former    Types: Cigarettes    Quit date: 10/02/1983    Years since quitting: 36.9   Smokeless tobacco: Never  Substance Use Topics   Alcohol use: No    Alcohol/week: 0.0 standard drinks     Current Outpatient Medications:    albuterol (VENTOLIN HFA) 108 (90 Base) MCG/ACT inhaler, Inhale 2 puffs into the lungs every 6 (six) hours as needed for wheezing or shortness of breath., Disp: 18 g, Rfl: 0   amLODipine-olmesartan (AZOR) 10-40 MG tablet, Take 1 tablet by mouth daily., Disp: 90 tablet, Rfl: 1   aspirin 81 MG EC tablet, Take 81 mg by mouth daily., Disp: , Rfl:    carvedilol (COREG) 12.5 MG tablet, Take 1 tablet (12.5 mg total) by mouth 2 (two) times daily with a meal., Disp: 180 tablet, Rfl: 1   febuxostat (ULORIC) 40 MG tablet, Take 1 tablet (40 mg total) by mouth daily. In place of Allopurinol for gout, Disp: 90 tablet, Rfl: 1   fluticasone (FLONASE) 50 MCG/ACT nasal spray, Place 2 sprays into both nostrils daily., Disp: 16 g, Rfl: 6   Fluticasone-Umeclidin-Vilant (TRELEGY ELLIPTA) 100-62.5-25 MCG/INH AEPB, Inhale 1 puff into the lungs daily., Disp: 1 each, Rfl: 11   Vitamin D, Ergocalciferol, (DRISDOL) 1.25 MG (50000 UNIT) CAPS capsule, Take 1 capsule (50,000 Units total) by mouth every 7 (seven) days., Disp: 12 capsule, Rfl: 1   rosuvastatin (CRESTOR) 40 MG tablet, Take 1 tablet (40 mg total) by mouth daily., Disp: 90 tablet, Rfl: 1  No Known Allergies  I personally reviewed active problem list,  medication list, allergies, family history, social history, health maintenance with the patient/caregiver today.   ROS  Constitutional: Negative for fever , positive for weight change.  Respiratory: Positive or cough and shortness of breath.   Cardiovascular: Negative for chest pain or palpitations.  Gastrointestinal: Negative for abdominal pain, no bowel changes.  Musculoskeletal: Negative for gait problem or joint swelling.  Skin: Negative for rash.  Neurological: Negative for dizziness or headache.  No other specific complaints in a complete review of systems (except as listed in HPI above).    Objective  Vitals:   09/01/20 1431  BP: 120/68  Pulse: 83  Resp: 16  Temp: 98.6 F (37 C)  TempSrc: Oral  SpO2: 98%  Weight: 215 lb (97.5 kg)  Height: 5\' 7"  (1.702 m)    Body mass index is 33.67 kg/m.  Physical Exam  Constitutional: Patient appears well-developed and well-nourished. Obese  No distress.  HEENT: head atraumatic, normocephalic, pupils equal and reactive to light, neck supple Cardiovascular: Normal rate, regular rhythm and normal heart sounds.  No murmur heard. No BLE edema. Pulmonary/Chest: Effort normal and breath sounds normal. No respiratory distress. Abdominal: Soft.  There is no tenderness. Psychiatric: Patient has a normal mood and affect. behavior is normal. Judgment and thought content normal.   Recent Results (from the past 2160 hour(s))  POCT HgB A1C     Status: Abnormal   Collection Time: 09/01/20  2:39 PM  Result Value Ref Range   Hemoglobin A1C 6.2 (A) 4.0 - 5.6 %   HbA1c POC (<> result, manual entry)     HbA1c, POC (prediabetic range)     HbA1c, POC (controlled diabetic range)      PHQ2/9: Depression screen [facility] 2/9 09/01/2020 05/20/2020 05/05/2020 02/20/2020 11/21/2019  Decreased Interest 0 0 0 0 0  Down, Depressed, Hopeless 0 0 0 0 0  PHQ - 2 Score 0 0 0 0 0  Altered sleeping - - - - -  Tired, decreased energy - - - - -  Change in appetite - -  - - -  Feeling bad or failure about yourself  - - - - -  Trouble concentrating - - - - -  Moving slowly or fidgety/restless - - - - -  Suicidal thoughts - - - - -  PHQ-9 Score - - - - -  Difficult doing work/chores - - - - -  Some recent data might be hidden    phq 9 is negative   Fall Risk: Fall Risk  09/01/2020 05/20/2020 05/05/2020 02/20/2020 11/21/2019  Falls in the past year? 0 0 0 0 0  Number falls in past yr: 0 0 0 0 0  Injury with Fall? 0 0 0 0 0  Follow up - - - Falls evaluation completed -      Functional Status Survey: Is the patient deaf or have difficulty hearing?: No Does the patient have difficulty seeing, even when wearing glasses/contacts?: No Does the patient have difficulty concentrating, remembering, or making decisions?: No Does the patient have difficulty walking or climbing stairs?: No Does the patient have difficulty dressing or bathing?: No Does the patient have difficulty doing errands alone such as visiting a doctor's office or shopping?: No    Assessment & Plan  1. Hypertension associated with type 2 diabetes mellitus (H[facility])  - POCT HgB A1C  2. Thoracic aortic atherosclerosis ([facility])  - rosuvastatin (CRESTOR) 40 MG tablet; Take 1 tablet (40 mg total) by mouth daily.  Dispense: 90 tablet; Refill: 1 - Lipid panel - Hepatic function panel  3. Dyslipidemia   4. Chronic kidney disease, stage 4 (severe) (H[facility])  Keep follow up with Dr. Candiss Norse   5. Controlled gout   6. Vitamin D deficiency   7. B12 deficiency   8. Moderate persistent reactive airway disease without complication  Resume Trelegy, voucher given to patient today   9. Focal segmental glomerulosclerosis    10. Anemia in stage 4 chronic kidney disease ([place])

## 2020-09-01 ENCOUNTER — Other Ambulatory Visit: Payer: Self-pay

## 2020-09-01 ENCOUNTER — Ambulatory Visit: Payer: Commercial Managed Care - PPO | Admitting: Family Medicine

## 2020-09-01 ENCOUNTER — Encounter: Payer: Self-pay | Admitting: Family Medicine

## 2020-09-01 VITALS — BP 120/68 | HR 83 | Temp 98.6°F | Resp 16 | Ht 67.0 in | Wt 215.0 lb

## 2020-09-01 DIAGNOSIS — I152 Hypertension secondary to endocrine disorders: Secondary | ICD-10-CM

## 2020-09-01 DIAGNOSIS — N051 Unspecified nephritic syndrome with focal and segmental glomerular lesions: Secondary | ICD-10-CM

## 2020-09-01 DIAGNOSIS — E559 Vitamin D deficiency, unspecified: Secondary | ICD-10-CM

## 2020-09-01 DIAGNOSIS — E1159 Type 2 diabetes mellitus with other circulatory complications: Secondary | ICD-10-CM | POA: Diagnosis not present

## 2020-09-01 DIAGNOSIS — D631 Anemia in chronic kidney disease: Secondary | ICD-10-CM

## 2020-09-01 DIAGNOSIS — N184 Chronic kidney disease, stage 4 (severe): Secondary | ICD-10-CM | POA: Diagnosis not present

## 2020-09-01 DIAGNOSIS — J4540 Moderate persistent asthma, uncomplicated: Secondary | ICD-10-CM

## 2020-09-01 DIAGNOSIS — E538 Deficiency of other specified B group vitamins: Secondary | ICD-10-CM

## 2020-09-01 DIAGNOSIS — E785 Hyperlipidemia, unspecified: Secondary | ICD-10-CM

## 2020-09-01 DIAGNOSIS — I700 Atherosclerosis of aorta: Secondary | ICD-10-CM

## 2020-09-01 DIAGNOSIS — M109 Gout, unspecified: Secondary | ICD-10-CM

## 2020-09-01 LAB — HEPATIC FUNCTION PANEL
AG Ratio: 1.2 (calc) (ref 1.0–2.5)
ALT: 11 U/L (ref 6–29)
AST: 13 U/L (ref 10–35)
Albumin: 4.1 g/dL (ref 3.6–5.1)
Alkaline phosphatase (APISO): 69 U/L (ref 37–153)
Bilirubin, Direct: 0.1 mg/dL (ref 0.0–0.2)
Globulin: 3.5 g/dL (calc) (ref 1.9–3.7)
Indirect Bilirubin: 0.1 mg/dL (calc) — ABNORMAL LOW (ref 0.2–1.2)
Total Bilirubin: 0.2 mg/dL (ref 0.2–1.2)
Total Protein: 7.6 g/dL (ref 6.1–8.1)

## 2020-09-01 LAB — POCT GLYCOSYLATED HEMOGLOBIN (HGB A1C): Hemoglobin A1C: 6.2 % — AB (ref 4.0–5.6)

## 2020-09-01 LAB — LIPID PANEL
Cholesterol: 134 mg/dL (ref <200)
HDL: 41 mg/dL — ABNORMAL LOW (ref >=50)
LDL Cholesterol (Calc): 65 mg/dL (calc)
Non-HDL Cholesterol (Calc): 93 mg/dL (calc) (ref <130)
Total CHOL/HDL Ratio: 3.3 (calc) (ref <5.0)
Triglycerides: 213 mg/dL — ABNORMAL HIGH (ref <150)

## 2020-09-01 MED ORDER — ROSUVASTATIN CALCIUM 40 MG PO TABS: 40.0000 mg | ORAL_TABLET | Freq: Every day | ORAL | 1 refills | Status: DC

## 2020-09-01 MED ORDER — DAPAGLIFLOZIN PROPANEDIOL 5 MG PO TABS: 5.0000 mg | ORAL_TABLET | Freq: Every day | ORAL | 1 refills | Status: DC

## 2020-09-01 NOTE — Addendum Note (Signed)
Addended by: Steele Sizer F on: 09/01/2020 03:24 PM   Modules accepted: Orders

## 2020-09-04 ENCOUNTER — Other Ambulatory Visit: Payer: Self-pay | Admitting: Family Medicine

## 2020-09-04 DIAGNOSIS — I700 Atherosclerosis of aorta: Secondary | ICD-10-CM

## 2020-09-04 DIAGNOSIS — M109 Gout, unspecified: Secondary | ICD-10-CM

## 2020-10-22 ENCOUNTER — Other Ambulatory Visit: Payer: Self-pay | Admitting: Family Medicine

## 2020-10-22 DIAGNOSIS — R809 Proteinuria, unspecified: Secondary | ICD-10-CM

## 2020-10-22 DIAGNOSIS — E1129 Type 2 diabetes mellitus with other diabetic kidney complication: Secondary | ICD-10-CM

## 2020-10-24 ENCOUNTER — Other Ambulatory Visit: Payer: Self-pay | Admitting: Family Medicine

## 2020-10-24 DIAGNOSIS — E1129 Type 2 diabetes mellitus with other diabetic kidney complication: Secondary | ICD-10-CM

## 2020-10-25 NOTE — Telephone Encounter (Signed)
dc'd 09/01/20 Called [facility] and informed pharmacist

## 2020-10-26 ENCOUNTER — Telehealth: Payer: Self-pay

## 2020-10-26 NOTE — Telephone Encounter (Signed)
PA initiated, awaiting response.

## 2020-10-26 NOTE — Telephone Encounter (Signed)
Copied from [place] [phone]. Topic: General - Other >> Oct 26, 2020  9:27 AM Tessa Lerner A wrote: Reason for CRM: The patient would like to be prescribed a cost effective alternative to dapagliflozin propanediol (FARXIGA) 5 MG TABS tablet [[id number]]   The patient's contacted both their pharmacy and insurance provider and been told that the medication will not be covered by their plan   The patient would like to know about cost saving alternatives like coupons or rebates / alternative prescriptions when possible

## 2020-11-02 ENCOUNTER — Ambulatory Visit: Payer: Commercial Managed Care - PPO | Admitting: Family Medicine

## 2020-11-26 ENCOUNTER — Ambulatory Visit (INDEPENDENT_AMBULATORY_CARE_PROVIDER_SITE_OTHER): Payer: Commercial Managed Care - PPO | Admitting: Family Medicine

## 2020-11-26 ENCOUNTER — Encounter: Payer: Self-pay | Admitting: Family Medicine

## 2020-11-26 ENCOUNTER — Other Ambulatory Visit: Payer: Self-pay

## 2020-11-26 VITALS — BP 136/74 | HR 74 | Temp 98.3°F | Resp 16 | Ht 67.0 in | Wt 213.4 lb

## 2020-11-26 DIAGNOSIS — Z13820 Encounter for screening for osteoporosis: Secondary | ICD-10-CM

## 2020-11-26 DIAGNOSIS — Z23 Encounter for immunization: Secondary | ICD-10-CM | POA: Diagnosis not present

## 2020-11-26 DIAGNOSIS — Z1211 Encounter for screening for malignant neoplasm of colon: Secondary | ICD-10-CM

## 2020-11-26 DIAGNOSIS — Z1231 Encounter for screening mammogram for malignant neoplasm of breast: Secondary | ICD-10-CM | POA: Diagnosis not present

## 2020-11-26 DIAGNOSIS — E2839 Other primary ovarian failure: Secondary | ICD-10-CM

## 2020-11-26 DIAGNOSIS — Z0000 Encounter for general adult medical examination without abnormal findings: Secondary | ICD-10-CM | POA: Diagnosis not present

## 2020-11-26 NOTE — Progress Notes (Signed)
Name: Cheryl Barr   MRN: [id number]    DOB: Feb 26, 1955   Date:11/26/2020       Progress Note  Subjective  Chief Complaint  Annual Exam  HPI  Patient presents for annual CPE.  Diet: eats out 2-3 times per week Exercise: needs to increase physical activity to 150 minutes per week  [place] Visit from 02/20/2020 in [facility]  AUDIT-C Score 0      Depression: Phq 9 is  negative Depression screen [facility] 2/9 11/26/2020 09/01/2020 05/20/2020 05/05/2020 02/20/2020  Decreased Interest 0 0 0 0 0  Down, Depressed, Hopeless 0 0 0 0 0  PHQ - 2 Score 0 0 0 0 0  Altered sleeping - - - - -  Tired, decreased energy - - - - -  Change in appetite - - - - -  Feeling bad or failure about yourself  - - - - -  Trouble concentrating - - - - -  Moving slowly or fidgety/restless - - - - -  Suicidal thoughts - - - - -  PHQ-9 Score - - - - -  Difficult doing work/chores - - - - -  Some recent data might be hidden   Hypertension: BP Readings from Last 3 Encounters:  11/26/20 136/74  09/01/20 120/68  05/20/20 128/72   Obesity: Wt Readings from Last 3 Encounters:  11/26/20 213 lb 6.4 oz (96.8 kg)  09/01/20 215 lb (97.5 kg)  05/20/20 209 lb (94.8 kg)   BMI Readings from Last 3 Encounters:  11/26/20 33.42 kg/m  09/01/20 33.67 kg/m  05/20/20 32.73 kg/m     Vaccines:   Shingrix: up to date  Pneumonia: educated and discussed with patient. Flu: educated and discussed with patient.  Hep C Screening: 12/01/17 STD testing and prevention (HIV/chl/gon/syphilis): 12/01/17 Intimate partner violence: negative Sexual History :uses condoms Menstrual History/LMP/Abnormal Bleeding: discussed post-menopausal bleeding  Incontinence Symptoms: nocturia   Breast cancer:  - Last Mammogram: 09/06/19 - BRCA gene screening: N/A  Osteoporosis: Discussed high calcium and vitamin D supplementation, weight bearing exercises, bone density ordered today   Cervical cancer  screening: 11/21/19  Skin cancer: Discussed monitoring for atypical lesions  Colorectal cancer: 2019 , needs to do cologuard  Lung cancer: Low Dose CT Chest recommended if Age 97-80 years, 20 pack-year currently smoking OR have quit w/in 15years. Patient does not qualify.   ECG: 03/27/19  Advanced Care Planning: A voluntary discussion about advance care planning including the explanation and discussion of advance directives.  Discussed health care proxy and Living will, and the patient was able to identify a health care proxy as daughter Maple Hudson )   Lipids: Lab Results  Component Value Date   CHOL 134 09/01/2020   CHOL 194 11/21/2019   CHOL 144 11/15/2018   Lab Results  Component Value Date   HDL 41 (L) 09/01/2020   HDL 48 (L) 11/21/2019   HDL 44 (L) 11/15/2018   Lab Results  Component Value Date   LDLCALC 65 09/01/2020   LDLCALC 116 (H) 11/21/2019   LDLCALC 71 11/15/2018   Lab Results  Component Value Date   TRIG 213 (H) 09/01/2020   TRIG 188 (H) 11/21/2019   TRIG 195 (H) 11/15/2018   Lab Results  Component Value Date   CHOLHDL 3.3 09/01/2020   CHOLHDL 4.0 11/21/2019   CHOLHDL 3.3 11/15/2018   No results found for: LDLDIRECT  Glucose: Glucose  Date Value Ref Range Status  06/17/2013 137 (H)  65 - 99 mg/dL Final   Glucose, Bld  Date Value Ref Range Status  11/21/2019 106 (H) 65 - 99 mg/dL Final    Comment:    .            Fasting reference interval . For someone without known diabetes, a glucose value between 100 and 125 mg/dL is consistent with prediabetes and should be confirmed with a follow-up test. .   03/27/2019 137 (H) 70 - 99 mg/dL Final  02/19/2019 128 (H) 65 - 99 mg/dL Final    Comment:    .            Fasting reference interval . For someone without known diabetes, a glucose value >125 mg/dL indicates that they may have diabetes and this should be confirmed with a follow-up test. .    Glucose-Capillary  Date Value Ref Range  Status  05/24/2019 127 (H) 70 - 99 mg/dL Final    Comment:    Glucose reference range applies only to samples taken after fasting for at least 8 hours.  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   Incomplete rotator cuff tear or rupture of left shoulder, not specified as traumatic 07/26/2016   Thoracic aortic atherosclerosis ([place]) 01/12/2016   DDD (degenerative disc disease), 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/2016   B12 deficiency 01/11/2016   Hernia of abdominal cavity 09/15/2014   Impaired renal function 08/10/2014   Controlled type 2 diabetes with renal manifestation 08/10/2014   Dyslipidemia 08/10/2014   Interval gout 08/10/2014   Microalbuminuria 08/10/2014   Osteoarthritis of left knee 08/10/2014   Vitamin D deficiency 08/10/2014   Cough 01/21/2014   Intraductal papilloma of breast 06/27/2013   Hypertension, essential, benign 09/02/2009   ABNORMAL EKG 09/02/2009    Past Surgical History:  Procedure Laterality Date   BREAST BIOPSY Left 05/24/2013   neg   BREAST EXCISIONAL BIOPSY Left 06/21/2013   neg/ dr. Bary Castilla   BREAST SURGERY Left 2015   COLONOSCOPY  2009   1 polyp found. Dr. Roselyn Reef   HERNIA REPAIR  85/88/5027   Ventral/umbilical hernia repair with 10 x 10 cm Atrium mesh in preperitoneal position.   INSERTION OF MESH N/A 10/09/2014   Procedure: INSERTION OF MESH;  Surgeon: Robert Bellow, MD;  Location: [facility] ORS;  Service: General;  Laterality: N/A;   KNEE SURGERY Left 01/14/2009   STRABISMUS SURGERY     Repair as a child   TUBAL  LIGATION  1990   VENTRAL HERNIA REPAIR N/A 10/09/2014   Procedure: HERNIA REPAIR VENTRAL ADULT;  Surgeon: Robert Bellow, MD;  Location: [facility] ORS;  Service: General;  Laterality: N/A;    Family History  Problem Relation Age of Onset   Hypertension Mother    Diabetes Mother    Heart attack Father    Coronary artery disease Father    Breast cancer Father 41   Diabetes Sister    Gout Brother    Heart disease Brother    Allergies Son        Haematologist and [employer]  Stings    Social History   Socioeconomic History   Marital status: Widowed    Spouse name: Not on file   Number of children: 3   Years of education: 12th Grade   Highest education level: Not on file  Occupational History   Occupation: kitchen prep     Employer: [employer]  Tobacco Use   Smoking status: Former    Types: Cigarettes    Quit date: 10/02/1983    Years since quitting: 37.1   Smokeless tobacco: Never  Vaping Use   Vaping Use: Never used  Substance and Sexual Activity   Alcohol use: No    Alcohol/week: 0.0 standard drinks   Drug use: No   Sexual activity: Yes    Partners: Male    Birth control/protection: None    Comment: declined condoms   Other Topics Concern   Not on file  Social History Narrative   Widowed. Living with boyfriend    No regular exercise.   Two other brothers one adopted since 71 days old, died in May 07, 2020 with liver cancer, also has a foster brother    Social Determinants of Radio broadcast assistant Strain: Low Risk    Difficulty of Paying Living Expenses: Not hard at all  Food Insecurity: No Food Insecurity   Worried About Charity fundraiser in the Last Year: Never true   Arboriculturist in the Last Year: Never true  Transportation Needs: No Transportation Needs   Lack of Transportation (Medical): No   Lack of Transportation (Non-Medical): No  Physical Activity: Insufficiently Active   Days of Exercise per Week: 5 days   Minutes of Exercise per Session: 20 min   Stress: No Stress Concern Present   Feeling of Stress : Not at all  Social Connections: Moderately Isolated   Frequency of Communication with Friends and Family: Once a week   Frequency of Social Gatherings with Friends and Family: Once a week   Attends Religious Services: 1 to 4 times per year   Active Member of [employer] or Organizations: No   Attends Music therapist: Never   Marital Status: Living with partner  Intimate Partner Violence: Not At Risk   Fear of Current or Ex-Partner: No   Emotionally Abused: No   Physically Abused: No   Sexually Abused: No     Current Outpatient Medications:    albuterol (VENTOLIN HFA) 108 (90 Base) MCG/ACT inhaler, Inhale 2 puffs into the lungs every 6 (six) hours as needed for wheezing or shortness of breath., Disp: 18 g, Rfl: 0   amLODipine-olmesartan (AZOR) 10-40 MG tablet, Take 1 tablet by mouth daily., Disp: 90 tablet, Rfl: 1   aspirin 81 MG EC tablet, Take 81 mg by mouth daily., Disp: , Rfl:    carvedilol (COREG) 12.5 MG tablet, Take 1 tablet (12.5 mg total) by mouth 2 (two) times daily with a meal., Disp: 180 tablet, Rfl: 1   dapagliflozin propanediol (FARXIGA) 5 MG TABS tablet, Take 1 tablet (5 mg total) by mouth daily before breakfast., Disp: 90 tablet, Rfl: 1   febuxostat (ULORIC) 40 MG tablet, TAKE 1 TABLET BY MOUTH ONCE DAILY -  IN  PLACE  OF  ALLOPURINOL  FOR  GOUT, Disp: 90 tablet, Rfl: 0   Fluticasone-Umeclidin-Vilant (TRELEGY ELLIPTA) 100-62.5-25 MCG/INH AEPB, Inhale 1 puff into the lungs daily., Disp: 1 each, Rfl: 11   rosuvastatin (CRESTOR) 40 MG tablet, Take 1 tablet by mouth once daily, Disp: 90  tablet, Rfl: 0   Vitamin D, Ergocalciferol, (DRISDOL) 1.25 MG (50000 UNIT) CAPS capsule, Take 1 capsule (50,000 Units total) by mouth every 7 (seven) days., Disp: 12 capsule, Rfl: 1   fluticasone (FLONASE) 50 MCG/ACT nasal spray, Place 2 sprays into both nostrils daily. (Patient not taking: Reported on 11/26/2020), Disp: 16 g, Rfl:  6  No Known Allergies   ROS  Constitutional: Negative for fever or weight change.  Respiratory: positive for chronic  cough but no  shortness of breath.   Cardiovascular: Negative for chest pain or palpitations.  Gastrointestinal: Negative for abdominal pain, no bowel changes.  Musculoskeletal: Negative for gait problem or joint swelling.  Skin: Negative for rash.  Neurological: Negative for dizziness or headache.  No other specific complaints in a complete review of systems (except as listed in HPI above).   Objective  Vitals:   11/26/20 0856  BP: 136/74  Pulse: 74  Resp: 16  Temp: 98.3 F (36.8 C)  TempSrc: Oral  SpO2: 98%  Weight: 213 lb 6.4 oz (96.8 kg)  Height: '5\' 7"'  (1.702 m)    Body mass index is 33.42 kg/m.  Physical Exam  Constitutional: Patient appears well-developed and well-nourished. No distress.  HENT: Head: Normocephalic and atraumatic. Ears: B TMs ok, no erythema or effusion; Nose: Not done . Mouth/Throat:not done  Eyes: Conjunctivae and EOM are normal. Pupils are equal, round, and reactive to light. No scleral icterus. Strabismus also has teary eyes, advised to follow up with ophthalmologist  Neck: Normal range of motion. Neck supple. No JVD present. No thyromegaly present.  Cardiovascular: Normal rate, regular rhythm and normal heart sounds.  No murmur heard. No BLE edema. Pulmonary/Chest: Effort normal and breath sounds normal. No respiratory distress. Abdominal: Soft. Bowel sounds are normal, no distension. There is no tenderness. no masses Breast: no lumps or masses, no nipple discharge or rashes FEMALE GENITALIA:  Not done  RECTAL: not done  Musculoskeletal: Normal range of motion, no joint effusions. No gross deformities Neurological: he is alert and oriented to person, place, and time. No cranial nerve deficit. Coordination, balance, strength, speech and gait are normal.  Skin: Skin is warm and dry. No rash noted. No erythema.  Psychiatric:  Patient has a normal mood and affect. behavior is normal. Judgment and thought content normal.   Recent Results (from the past 2160 hour(s))  POCT HgB A1C     Status: Abnormal   Collection Time: 09/01/20  2:39 PM  Result Value Ref Range   Hemoglobin A1C 6.2 (A) 4.0 - 5.6 %   HbA1c POC (<> result, manual entry)     HbA1c, POC (prediabetic range)     HbA1c, POC (controlled diabetic range)    Lipid panel     Status: Abnormal   Collection Time: 09/01/20  3:32 PM  Result Value Ref Range   Cholesterol 134 <200 mg/dL   HDL 41 (L) > OR = 50 mg/dL   Triglycerides 213 (H) <150 mg/dL    Comment: . If a non-fasting specimen was collected, consider repeat triglyceride testing on a fasting specimen if clinically indicated.  Yates Decamp et al. J. of Clin. Lipidol. 0539;7:673-419. Marland Kitchen    LDL Cholesterol (Calc) 65 mg/dL (calc)    Comment: Reference range: <100 . Desirable range <100 mg/dL for primary prevention;   <70 mg/dL for patients with CHD or diabetic patients  with > or = 2 CHD risk factors. Marland Kitchen LDL-C is now calculated using the Martin-Hopkins  calculation, which is a validated  novel method providing  better accuracy than the Friedewald equation in the  estimation of LDL-C.  Cresenciano Genre et al. Annamaria Helling. 8887;579(72): 2061-2068  (http://education.[facility].com/faq/FAQ164)    Total CHOL/HDL Ratio 3.3 <5.0 (calc)   Non-HDL Cholesterol (Calc) 93 <130 mg/dL (calc)    Comment: For patients with diabetes plus 1 major ASCVD risk  factor, treating to a non-HDL-C goal of <100 mg/dL  (LDL-C of <70 mg/dL) is considered a therapeutic  option.   Hepatic function panel     Status: Abnormal   Collection Time: 09/01/20  3:32 PM  Result Value Ref Range   Total Protein 7.6 6.1 - 8.1 g/dL   Albumin 4.1 3.6 - 5.1 g/dL   Globulin 3.5 1.9 - 3.7 g/dL (calc)   AG Ratio 1.2 1.0 - 2.5 (calc)   Total Bilirubin 0.2 0.2 - 1.2 mg/dL   Bilirubin, Direct 0.1 0.0 - 0.2 mg/dL   Indirect Bilirubin 0.1 (L) 0.2 - 1.2  mg/dL (calc)   Alkaline phosphatase (APISO) 69 37 - 153 U/L   AST 13 10 - 35 U/L   ALT 11 6 - 29 U/L     Fall Risk: Fall Risk  11/26/2020 09/01/2020 05/20/2020 05/05/2020 02/20/2020  Falls in the past year? 0 0 0 0 0  Number falls in past yr: 0 0 0 0 0  Injury with Fall? 0 0 0 0 0  Risk for fall due to : No Fall Risks - - - -  Follow up Falls prevention discussed - - - Falls evaluation completed     Functional Status Survey: Is the patient deaf or have difficulty hearing?: No Does the patient have difficulty seeing, even when wearing glasses/contacts?: No Does the patient have difficulty concentrating, remembering, or making decisions?: No Does the patient have difficulty walking or climbing stairs?: No Does the patient have difficulty dressing or bathing?: No Does the patient have difficulty doing errands alone such as visiting a doctor's office or shopping?: No   Assessment & Plan  1. Well adult exam   2. Need for 23-polyvalent pneumococcal polysaccharide vaccine  - Pneumococcal polysaccharide vaccine 23-valent greater than or equal to 2yo subcutaneous/IM  3. Breast cancer screening by mammogram  - MM 3D SCREEN BREAST BILATERAL; Future   4. Colon cancer screening   Needs to get Cologuard done  -USPSTF grade A and B recommendations reviewed with patient; age-appropriate recommendations, preventive care, screening tests, etc discussed and encouraged; healthy living encouraged; see AVS for patient education given to patient -Discussed importance of 150 minutes of physical activity weekly, eat two servings of fish weekly, eat one serving of tree nuts ( cashews, pistachios, pecans, almonds.Marland Kitchen) every other day, eat 6 servings of fruit/vegetables daily and drink plenty of water and avoid sweet beverages.

## 2020-11-26 NOTE — Patient Instructions (Signed)
Preventive Care 40 Years and Older, Female Preventive care refers to lifestyle choices and visits with your health care provider that can promote health and wellness. This includes: A yearly physical exam. This is also called an annual wellness visit. Regular dental and eye exams. Immunizations. Screening for certain conditions. Healthy lifestyle choices, such as: Eating a healthy diet. Getting regular exercise. Not using drugs or products that contain nicotine and tobacco. Limiting alcohol use. What can I expect for my preventive care visit? Physical exam Your health care provider will check your: Height and weight. These may be used to calculate your BMI (body mass index). BMI is a measurement that tells if you are at a healthy weight. Heart rate and blood pressure. Body temperature. Skin for abnormal spots. Counseling Your health care provider may ask you questions about your: Past medical problems. Family's medical history. Alcohol, tobacco, and drug use. Emotional well-being. Home life and relationship well-being. Sexual activity. Diet, exercise, and sleep habits. History of falls. Memory and ability to understand (cognition). Work and work Statistician. Pregnancy and menstrual history. Access to firearms. What immunizations do I need? Vaccines are usually given at various ages, according to a schedule. Your health care provider will recommend vaccines for you based on your age, medical history, and lifestyle or other factors, such as travel or where you work. What tests do I need? Blood tests Lipid and cholesterol levels. These may be checked every 5 years, or more often depending on your overall health. Hepatitis C test. Hepatitis B test. Screening Lung cancer screening. You may have this screening every year starting at age 33 if you have a 30-pack-year history of smoking and currently smoke or have quit within the past 15 years. Colorectal cancer screening. All  adults should have this screening starting at age 73 and continuing until age 9. Your health care provider may recommend screening at age 31 if you are at increased risk. You will have tests every 1-10 years, depending on your results and the type of screening test. Diabetes screening. This is done by checking your blood sugar (glucose) after you have not eaten for a while (fasting). You may have this done every 1-3 years. Mammogram. This may be done every 1-2 years. Talk with your health care provider about how often you should have regular mammograms. Abdominal aortic aneurysm (AAA) screening. You may need this if you are a current or former smoker. BRCA-related cancer screening. This may be done if you have a family history of breast, ovarian, tubal, or peritoneal cancers. Other tests STD (sexually transmitted disease) testing, if you are at risk. Bone density scan. This is done to screen for osteoporosis. You may have this done starting at age 43. Talk with your health care provider about your test results, treatment options, and if necessary, the need for more tests. Follow these instructions at home: Eating and drinking  Eat a diet that includes fresh fruits and vegetables, whole grains, lean protein, and low-fat dairy products. Limit your intake of foods with high amounts of sugar, saturated fats, and salt. Take vitamin and mineral supplements as recommended by your health care provider. Do not drink alcohol if your health care provider tells you not to drink. If you drink alcohol: Limit how much you have to 0-1 drink a day. Be aware of how much alcohol is in your drink. In the [place] one drink equals one 12 oz bottle of beer (355 mL), one 5 oz glass of wine (148 mL), or one  1 oz glass of hard liquor (44 mL). Lifestyle Take daily care of your teeth and gums. Brush your teeth every morning and night with fluoride toothpaste. Floss one time each day. Stay active. Exercise for at least  30 minutes 5 or more days each week. Do not use any products that contain nicotine or tobacco, such as cigarettes, e-cigarettes, and chewing tobacco. If you need help quitting, ask your health care provider. Do not use drugs. If you are sexually active, practice safe sex. Use a condom or other form of protection in order to prevent STIs (sexually transmitted infections). Talk with your health care provider about taking a low-dose aspirin or statin. Find healthy ways to cope with stress, such as: Meditation, yoga, or listening to music. Journaling. Talking to a trusted person. Spending time with friends and family. Safety Always wear your seat belt while driving or riding in a vehicle. Do not drive: If you have been drinking alcohol. Do not ride with someone who has been drinking. When you are tired or distracted. While texting. Wear a helmet and other protective equipment during sports activities. If you have firearms in your house, make sure you follow all gun safety procedures. What's next? Visit your health care provider once a year for an annual wellness visit. Ask your health care provider how often you should have your eyes and teeth checked. Stay up to date on all vaccines. This information is not intended to replace advice given to you by your health care provider. Make sure you discuss any questions you have with your health care provider. Document Revised: 04/10/2020 Document Reviewed: 01/25/2018 Elsevier Patient Education  2022 [employer].

## 2020-12-01 ENCOUNTER — Other Ambulatory Visit: Payer: Self-pay

## 2020-12-01 ENCOUNTER — Encounter: Payer: Self-pay | Admitting: Family Medicine

## 2020-12-01 ENCOUNTER — Ambulatory Visit: Payer: Commercial Managed Care - PPO | Admitting: Family Medicine

## 2020-12-01 VITALS — BP 124/66 | HR 77 | Temp 98.5°F | Resp 16 | Ht 67.0 in | Wt 211.2 lb

## 2020-12-01 DIAGNOSIS — R0981 Nasal congestion: Secondary | ICD-10-CM

## 2020-12-01 DIAGNOSIS — N051 Unspecified nephritic syndrome with focal and segmental glomerular lesions: Secondary | ICD-10-CM

## 2020-12-01 DIAGNOSIS — E538 Deficiency of other specified B group vitamins: Secondary | ICD-10-CM | POA: Diagnosis not present

## 2020-12-01 DIAGNOSIS — I152 Hypertension secondary to endocrine disorders: Secondary | ICD-10-CM

## 2020-12-01 DIAGNOSIS — I10 Essential (primary) hypertension: Secondary | ICD-10-CM

## 2020-12-01 DIAGNOSIS — E559 Vitamin D deficiency, unspecified: Secondary | ICD-10-CM

## 2020-12-01 DIAGNOSIS — N184 Chronic kidney disease, stage 4 (severe): Secondary | ICD-10-CM | POA: Diagnosis not present

## 2020-12-01 DIAGNOSIS — E785 Hyperlipidemia, unspecified: Secondary | ICD-10-CM | POA: Diagnosis not present

## 2020-12-01 DIAGNOSIS — D631 Anemia in chronic kidney disease: Secondary | ICD-10-CM

## 2020-12-01 DIAGNOSIS — J4540 Moderate persistent asthma, uncomplicated: Secondary | ICD-10-CM

## 2020-12-01 DIAGNOSIS — E1159 Type 2 diabetes mellitus with other circulatory complications: Secondary | ICD-10-CM

## 2020-12-01 DIAGNOSIS — H6981 Other specified disorders of Eustachian tube, right ear: Secondary | ICD-10-CM

## 2020-12-01 DIAGNOSIS — M109 Gout, unspecified: Secondary | ICD-10-CM

## 2020-12-01 DIAGNOSIS — N2581 Secondary hyperparathyroidism of renal origin: Secondary | ICD-10-CM

## 2020-12-01 DIAGNOSIS — I700 Atherosclerosis of aorta: Secondary | ICD-10-CM

## 2020-12-01 LAB — POCT GLYCOSYLATED HEMOGLOBIN (HGB A1C): Hemoglobin A1C: 6.7 % — AB (ref 4.0–5.6)

## 2020-12-01 MED ORDER — ROSUVASTATIN CALCIUM 40 MG PO TABS: 40.0000 mg | ORAL_TABLET | Freq: Every day | ORAL | 1 refills | Status: DC

## 2020-12-01 MED ORDER — FLUTICASONE PROPIONATE 50 MCG/ACT NA SUSP: 2.0000 | Freq: Every day | NASAL | 6 refills | Status: DC

## 2020-12-01 MED ORDER — AMLODIPINE-OLMESARTAN 10-40 MG PO TABS: 1.0000 | ORAL_TABLET | Freq: Every day | ORAL | 1 refills | Status: DC

## 2020-12-01 MED ORDER — SITAGLIPTIN PHOSPHATE 25 MG PO TABS: 25.0000 mg | ORAL_TABLET | Freq: Every day | ORAL | 5 refills | Status: DC

## 2020-12-01 NOTE — Progress Notes (Signed)
Name: Cheryl Barr   MRN: [id number]    DOB: [birth year]   Date:12/01/2020       Progress Note  Subjective  Chief Complaint  Follow Up  HPI  HTN: compliant with medication, Azor, and carvedilol.  She denies side effects, no chest pain, palpitationor dizziness, she has SOB but stable. BP at goal today , continue current medication    Hyperlipidemia/Atherosclerosis of aorta : LDL done  7/22 was 65 , continue Atorvastatin 40mg     DMII with CKI stage IV and also HTN: urine micro has been high and GFR low, under the care of Dr. Candiss Norse,  kidney biopsy showed glomerulonephritis She is on  aspirin,  she has symptoms of meralgia paresthetica on right side for a long time, she was on Lyrica and stopped taking it again because not efficatious. .She denies polyphagia, but has noticed polydipsia  she has nocturia. A1C has been controlled, today is 6. 7%  %  she is now only on Farxiga, for the past few months,  and since GFR is low and it may not be working as well for diabetes we will resume [place] . She is on ARB ( Azor ) continue healthy diet    Gout: . she is on Uloric now,, and under control , last uric acid was dwon to 4.2   RAD: seen by Dr. 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 She has chronic dry cough, she has Trelegy at home but is expensive and only uses it prn. No wheezing ,but stable SOB with activity   Atherosclerosis of aorta: continue Crestor, and also on baby aspirin daily , last LDL was at goal , down to 65    Vitamin B12 deficiency:she is taking otc supplementation , unchanged    Chronic kidney disease: she is seeing Dr. 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 No pruritis, normal urine output . Reviewed labs done by Dr. Candiss Norse, she is now on low dose Farxiga for kidney protection   Right eustachian tube dysfunction: she  is not using nasal spray, she also has some rhinorrhea, no fever or chills.   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   Incomplete rotator cuff tear or rupture of left shoulder, not specified as traumatic 07/26/2016   Thoracic aortic atherosclerosis ([place]) 01/12/2016   DDD (degenerative disc disease), 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/2016   B12 deficiency 01/11/2016   Hernia of abdominal cavity 09/15/2014   Impaired renal function 08/10/2014   Controlled type 2 diabetes with renal manifestation 08/10/2014   Dyslipidemia 08/10/2014   Interval gout 08/10/2014   Microalbuminuria 08/10/2014   Osteoarthritis of left knee 08/10/2014   Vitamin D deficiency 08/10/2014   Cough 01/21/2014   Intraductal papilloma of breast 06/27/2013   Hypertension, essential, benign 09/02/2009   ABNORMAL EKG 09/02/2009    Past Surgical History:  Procedure Laterality Date   BREAST BIOPSY Left 05/24/2013   neg   BREAST EXCISIONAL BIOPSY Left 06/21/2013   neg/ dr. Bary Castilla   BREAST SURGERY Left 2015  COLONOSCOPY  2009   1 polyp found. Dr. Roselyn Reef   HERNIA REPAIR  36/[id number]/[id number]   Ventral/umbilical hernia repair with 10 x 10 cm Atrium mesh in preperitoneal position.   INSERTION OF MESH N/A 10/09/2014   Procedure: INSERTION OF MESH;  Surgeon: Robert Bellow, MD;  Location: [facility] ORS;  Service: General;  Laterality: N/A;   KNEE SURGERY Left 01/14/2009   STRABISMUS SURGERY     Repair as a child   TUBAL LIGATION  1990   VENTRAL HERNIA REPAIR N/A 10/09/2014   Procedure: HERNIA REPAIR VENTRAL ADULT;  Surgeon: Robert Bellow, MD;  Location: [facility] ORS;  Service:  General;  Laterality: N/A;    Family History  Problem Relation Age of Onset   Hypertension Mother    Diabetes Mother    Heart attack Father    Coronary artery disease Father    Breast cancer Father 57   Diabetes Sister    Gout Brother    Heart disease Brother    Allergies Son        Haematologist and Geographical information systems officer    Social History   Tobacco Use   Smoking status: Former    Types: Cigarettes    Quit date: 10/02/1983    Years since quitting: 37.1   Smokeless tobacco: Never  Substance Use Topics   Alcohol use: No    Alcohol/week: 0.0 standard drinks     Current Outpatient Medications:    amLODipine-olmesartan (AZOR) 10-40 MG tablet, Take 1 tablet by mouth daily., Disp: 90 tablet, Rfl: 1   aspirin 81 MG EC tablet, Take 81 mg by mouth daily., Disp: , Rfl:    carvedilol (COREG) 12.5 MG tablet, Take 1 tablet (12.5 mg total) by mouth 2 (two) times daily with a meal., Disp: 180 tablet, Rfl: 1   dapagliflozin propanediol (FARXIGA) 5 MG TABS tablet, Take 1 tablet (5 mg total) by mouth daily before breakfast., Disp: 90 tablet, Rfl: 1   febuxostat (ULORIC) 40 MG tablet, TAKE 1 TABLET BY MOUTH ONCE DAILY -  IN  PLACE  OF  ALLOPURINOL  FOR  GOUT, Disp: 90 tablet, Rfl: 0   rosuvastatin (CRESTOR) 40 MG tablet, Take 1 tablet by mouth once daily, Disp: 90 tablet, Rfl: 0   Vitamin D, Ergocalciferol, (DRISDOL) 1.25 MG (50000 UNIT) CAPS capsule, Take 1 capsule (50,000 Units total) by mouth every 7 (seven) days., Disp: 12 capsule, Rfl: 1   albuterol (VENTOLIN HFA) 108 (90 Base) MCG/ACT inhaler, Inhale 2 puffs into the lungs every 6 (six) hours as needed for wheezing or shortness of breath. (Patient not taking: Reported on 12/01/2020), Disp: 18 g, Rfl: 0   fluticasone (FLONASE) 50 MCG/ACT nasal spray, Place 2 sprays into both nostrils daily. (Patient not taking: No sig reported), Disp: 16 g, Rfl: 6   Fluticasone-Umeclidin-Vilant (TRELEGY ELLIPTA) 100-62.5-25 MCG/INH AEPB, Inhale 1 puff into the lungs daily.  (Patient not taking: Reported on 12/01/2020), Disp: 1 each, Rfl: 11  No Known Allergies  I personally reviewed active problem list, medication list, allergies, family history, social history, health maintenance with the patient/caregiver today.   ROS  Constitutional: Negative for fever or weight change.  Respiratory: Negative for cough and shortness of breath.   Cardiovascular: Negative for chest pain or palpitations.  Gastrointestinal: Negative for abdominal pain, no bowel changes.  Musculoskeletal: Negative for gait problem or joint swelling.  Skin: Negative for rash.  Neurological: Negative for dizziness or headache.  No other specific complaints in a complete review of systems (  except as listed in HPI above).   Objective  Vitals:   12/01/20 1437  BP: 124/66  Pulse: 77  Resp: 16  Temp: 98.5 F (36.9 C)  TempSrc: Oral  SpO2: 99%  Weight: 211 lb 3.2 oz (95.8 kg)  Height: 5\' 7"  (1.702 m)    Body mass index is 33.08 kg/m.  Physical Exam  Constitutional: Patient appears well-developed and well-nourished. Obese  No distress.  HEENT: head atraumatic, normocephalic, pupils equal and reactive to light,  neck supple Cardiovascular: Normal rate, regular rhythm and normal heart sounds.  No murmur heard. No BLE edema. Pulmonary/Chest: Effort normal and breath sounds normal. No respiratory distress. Abdominal: Soft.  There is no tenderness. Psychiatric: Patient has a normal mood and affect. behavior is normal. Judgment and thought content normal.    PHQ2/9: Depression screen [facility] 2/9 12/01/2020 11/26/2020 09/01/2020 05/20/2020 05/05/2020  Decreased Interest 0 0 0 0 0  Down, Depressed, Hopeless 0 0 0 0 0  PHQ - 2 Score 0 0 0 0 0  Altered sleeping 0 - - - -  Tired, decreased energy 0 - - - -  Change in appetite 0 - - - -  Feeling bad or failure about yourself  0 - - - -  Trouble concentrating 0 - - - -  Moving slowly or fidgety/restless 0 - - - -  Suicidal thoughts 0 - - - -   PHQ-9 Score 0 - - - -  Difficult doing work/chores Not difficult at all - - - -  Some recent data might be hidden    phq 9 is negative   Fall Risk: Fall Risk  12/01/2020 11/26/2020 09/01/2020 05/20/2020 05/05/2020  Falls in the past year? 0 0 0 0 0  Number falls in past yr: 0 0 0 0 0  Injury with Fall? 0 0 0 0 0  Risk for fall due to : No Fall Risks No Fall Risks - - -  Follow up Falls prevention discussed Falls prevention discussed - - -      Functional Status Survey: Is the patient deaf or have difficulty hearing?: No Does the patient have difficulty seeing, even when wearing glasses/contacts?: No Does the patient have difficulty concentrating, remembering, or making decisions?: No Does the patient have difficulty walking or climbing stairs?: No Does the patient have difficulty dressing or bathing?: No Does the patient have difficulty doing errands alone such as visiting a doctor's office or shopping?: No    Assessment & Plan  1. Hypertension associated with type 2 diabetes mellitus (HCC)  - HM Diabetes Foot Exam - POCT HgB A1C - sitaGLIPtin (JANUVIA) 25 MG tablet; Take 1 tablet (25 mg total) by mouth daily.  Dispense: 30 tablet; Refill: 5 - amLODipine-olmesartan (AZOR) 10-40 MG tablet; Take 1 tablet by mouth daily.  Dispense: 90 tablet; Refill: 1  2. Chronic kidney disease, stage 4 (severe) (HCC)  - amLODipine-olmesartan (AZOR) 10-40 MG tablet; Take 1 tablet by mouth daily.  Dispense: 90 tablet; Refill: 1  3. B12 deficiency   4. Anemia in stage 4 chronic kidney disease ([place])   5. Dyslipidemia  - rosuvastatin (CRESTOR) 40 MG tablet; Take 1 tablet (40 mg total) by mouth daily.  Dispense: 90 tablet; Refill: 1  6. Thoracic aortic atherosclerosis (HCC)  - rosuvastatin (CRESTOR) 40 MG tablet; Take 1 tablet (40 mg total) by mouth daily.  Dispense: 90 tablet; Refill: 1  7. Vitamin D deficiency   8. Controlled gout  9. Moderate persistent reactive airway  disease  without complication   10. Hypertension, essential, benign  - amLODipine-olmesartan (AZOR) 10-40 MG tablet; Take 1 tablet by mouth daily.  Dispense: 90 tablet; Refill: 1  11. Focal segmental glomerulosclerosis   12. Hyperparathyroidism due to renal insufficiency ([place])   13. Thoracic aortic atherosclerosis (HCC)  - rosuvastatin (CRESTOR) 40 MG tablet; Take 1 tablet (40 mg total) by mouth daily.  Dispense: 90 tablet; Refill: 1  14. Eustachian tube dysfunction, right  - fluticasone (FLONASE) 50 MCG/ACT nasal spray; Place 2 sprays into both nostrils daily.  Dispense: 16 g; Refill: 6

## 2020-12-11 ENCOUNTER — Other Ambulatory Visit: Payer: Self-pay | Admitting: Family Medicine

## 2020-12-11 ENCOUNTER — Telehealth: Payer: Self-pay | Admitting: Family Medicine

## 2020-12-11 DIAGNOSIS — M109 Gout, unspecified: Secondary | ICD-10-CM

## 2020-12-11 NOTE — Telephone Encounter (Signed)
Pt calling in regards to the prescription Januvia. She states that she went to pick up from pharmacy with coupon and they are still requiring a $100+ co pay. Pt requesting to have PCP send over another medication. Please advise.        [facility] [address] [place]  [place] [address]  Phone: [phone] Fax: [phone]  Hours: Not open 24 hours

## 2020-12-14 ENCOUNTER — Other Ambulatory Visit (INDEPENDENT_AMBULATORY_CARE_PROVIDER_SITE_OTHER): Payer: Commercial Managed Care - PPO

## 2020-12-14 ENCOUNTER — Telehealth: Payer: Self-pay

## 2020-12-14 DIAGNOSIS — E1129 Type 2 diabetes mellitus with other diabetic kidney complication: Secondary | ICD-10-CM

## 2020-12-14 DIAGNOSIS — Z79899 Other long term (current) drug therapy: Secondary | ICD-10-CM | POA: Diagnosis not present

## 2020-12-14 DIAGNOSIS — R809 Proteinuria, unspecified: Secondary | ICD-10-CM

## 2020-12-14 MED ORDER — GLIPIZIDE ER 5 MG PO TB24: 5.0000 mg | ORAL_TABLET | Freq: Every day | ORAL | 0 refills | Status: DC

## 2020-12-14 NOTE — Telephone Encounter (Signed)
New RX sent and referral placed for assistance

## 2020-12-14 NOTE — Telephone Encounter (Signed)
Pt says that her Januvia and her [place] are to expensive and her Januvia use to be just 5 dollars but now has gone up. Please write her something else or give her a call [phone]

## 2021-02-01 ENCOUNTER — Telehealth: Payer: Self-pay

## 2021-02-01 NOTE — Telephone Encounter (Signed)
Copied from [place] [id number]. Topic: Quick Communication - Rx Refill/Question >> Feb 01, 2021  2:33 PM [place], [name] wrote: Pt stated she cannot afford dapagliflozin propanediol (FARXIGA) 5 MG TABS tablet, pt wanted to know if there was a cheaper generic option of if the office had any coupons, please advise.

## 2021-02-02 NOTE — Telephone Encounter (Signed)
Patient will stop by today for coupons and let us know if she has any trouble from there.

## 2021-03-02 ENCOUNTER — Other Ambulatory Visit: Payer: Self-pay | Admitting: Family Medicine

## 2021-03-02 DIAGNOSIS — I152 Hypertension secondary to endocrine disorders: Secondary | ICD-10-CM

## 2021-03-02 DIAGNOSIS — I10 Essential (primary) hypertension: Secondary | ICD-10-CM

## 2021-03-02 DIAGNOSIS — E1159 Type 2 diabetes mellitus with other circulatory complications: Secondary | ICD-10-CM

## 2021-03-05 NOTE — Progress Notes (Signed)
Name: Cheryl Barr   MRN: [id number]    DOB: 1955-11-14   Date:03/08/2021       Progress Note  Subjective  Chief Complaint  Follow Up  HPI  HTN: compliant with medication, Azor, and carvedilol.  She denies side effects, no chest pain, palpitationor dizziness, she has SOB but stable. BP at goal today , continue current medication    Hyperlipidemia/Atherosclerosis of aorta : LDL done  7/22 was 65 , continue Atorvastatin 40mg     DMII with CKI stage V and also HTN: urine micro has been high and GFR low, under the care of Dr. Candiss Norse,  kidney biopsy showed glomerulonephritis She is on  aspirin,  she has symptoms of meralgia paresthetica on right side for a long time, she was on Lyrica and stopped taking it again because not efficatious. .She denies polyphagia, but has noticed polydipsia and polyuria.  A1C was 6.7 % she is currently on Januvia , Glipizide and also on low dose Farxiga, mostly for kidney protection and A1C is down to 5.8 % We will stop Januvia and continue low dose Glipizide, may need to go down on the dose if she develops hypoglycemia    Gout: . she is on Uloric now, and under control , last uric acid was dwon to 4.2   RAD: seen by Dr. 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 She has chronic dry cough, she has Trelegy at home but is expensive and is only using it prn. Stable SOB with activity She has been coughing more lately, we will give her a sample of Trelegy if we have it. Advised her to use it daily for at least one week  Atherosclerosis of aorta: continue Crestor, and also on baby aspirin daily , last LDL was at goal , down to 65 . Continue current medications.    Vitamin B12 deficiency: she is taking otc supplementation.   Chronic kidney disease: she is seeing Dr. Candiss Norse, now is CKI stage V  has secondary hyperparathyroidism last Pth 274 , last GFR down to 19 , renal biopsy showed segmental glomerulonephritis . No  pruritis, normal urine output  Dr. Candiss Norse prescribed her  low dose [place] for kidney protection and recently referred to the transplant team at [facility], she has an appointment this week.     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   Incomplete rotator cuff tear or rupture of left shoulder, not specified as traumatic 07/26/2016   Thoracic aortic atherosclerosis ([place]) 01/12/2016   DDD (degenerative disc disease), 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/2016   B12 deficiency 01/11/2016   Hernia of abdominal cavity 09/15/2014   Impaired renal function 08/10/2014   Controlled type 2 diabetes with renal manifestation 08/10/2014   Dyslipidemia 08/10/2014   Interval gout 08/10/2014   Microalbuminuria 08/10/2014   Osteoarthritis of left knee 08/10/2014   Vitamin D deficiency 08/10/2014   Cough 01/21/2014   Intraductal papilloma of breast 06/27/2013   Hypertension, essential, benign 09/02/2009   ABNORMAL EKG 09/02/2009    Past Surgical History:  Procedure Laterality Date   BREAST BIOPSY Left 05/24/2013   neg   BREAST EXCISIONAL  BIOPSY Left 06/21/2013   neg/ dr. Bary Castilla   BREAST SURGERY Left 2015   COLONOSCOPY  2009   1 polyp found. Dr. Roselyn Reef   HERNIA REPAIR  40/97/3532   Ventral/umbilical hernia repair with 10 x 10 cm Atrium mesh in preperitoneal position.   INSERTION OF MESH N/A 10/09/2014   Procedure: INSERTION OF MESH;  Surgeon: Robert Bellow, MD;  Location: [facility] ORS;  Service: General;  Laterality: N/A;   KNEE SURGERY Left 01/14/2009   STRABISMUS SURGERY     Repair as a child   TUBAL LIGATION  1990   VENTRAL HERNIA REPAIR N/A 10/09/2014    Procedure: HERNIA REPAIR VENTRAL ADULT;  Surgeon: Robert Bellow, MD;  Location: [facility] ORS;  Service: General;  Laterality: N/A;    Family History  Problem Relation Age of Onset   Hypertension Mother    Diabetes Mother    Heart attack Father    Coronary artery disease Father    Breast cancer Father 7   Diabetes Sister    Gout Brother    Heart disease Brother    Allergies Son        Haematologist and Geographical information systems officer    Social History   Tobacco Use   Smoking status: Former    Types: Cigarettes    Quit date: 10/02/1983    Years since quitting: 37.4   Smokeless tobacco: Never  Substance Use Topics   Alcohol use: No    Alcohol/week: 0.0 standard drinks     Current Outpatient Medications:    albuterol (VENTOLIN HFA) 108 (90 Base) MCG/ACT inhaler, Inhale 2 puffs into the lungs every 6 (six) hours as needed for wheezing or shortness of breath., Disp: 18 g, Rfl: 0   amLODipine-olmesartan (AZOR) 10-40 MG tablet, Take 1 tablet by mouth daily., Disp: 90 tablet, Rfl: 1   aspirin 81 MG EC tablet, Take 81 mg by mouth daily., Disp: , Rfl:    carvedilol (COREG) 12.5 MG tablet, TAKE 1 TABLET BY MOUTH TWICE DAILY WITH A MEAL, Disp: 180 tablet, Rfl: 0   dapagliflozin propanediol (FARXIGA) 5 MG TABS tablet, Take 1 tablet (5 mg total) by mouth daily before breakfast., Disp: 90 tablet, Rfl: 1   febuxostat (ULORIC) 40 MG tablet, TAKE 1 TABLET BY MOUTH ONCE DAILY FOR  GOUT  (TAKE  IN  THE  PLACE  OF  ALLOPURINOL), Disp: 90 tablet, Rfl: 0   fluticasone (FLONASE) 50 MCG/ACT nasal spray, Place 2 sprays into both nostrils daily., Disp: 16 g, Rfl: 6   Fluticasone-Umeclidin-Vilant (TRELEGY ELLIPTA) 100-62.5-25 MCG/INH AEPB, Inhale 1 puff into the lungs daily., Disp: 1 each, Rfl: 11   glipiZIDE (GLUCOTROL XL) 5 MG 24 hr tablet, Take 1 tablet (5 mg total) by mouth daily with breakfast., Disp: 90 tablet, Rfl: 0   rosuvastatin (CRESTOR) 40 MG tablet, Take 1 tablet (40 mg total) by mouth daily., Disp: 90 tablet, Rfl:  1   sitaGLIPtin (JANUVIA) 25 MG tablet, Take 1 tablet (25 mg total) by mouth daily., Disp: 30 tablet, Rfl: 5   Vitamin D, Ergocalciferol, (DRISDOL) 1.25 MG (50000 UNIT) CAPS capsule, Take 1 capsule (50,000 Units total) by mouth every 7 (seven) days., Disp: 12 capsule, Rfl: 1  No Known Allergies  I personally reviewed active problem list, medication list, allergies, family history, social history, health maintenance with the patient/caregiver today.   ROS  Ten systems reviewed and is negative except as mentioned in HPI   Objective  Vitals:   03/08/21 1520  BP:  134/78  Pulse: 77  Resp: 16  Temp: 98 F (36.7 C)  SpO2: 98%  Weight: 211 lb (95.7 kg)  Height: 5\' 7"  (1.702 m)    Body mass index is 33.05 kg/m.  Physical Exam  Constitutional: Patient appears well-developed and well-nourished. Obese  No distress.  HEENT: head atraumatic, normocephalic, pupils equal and reactive to light,  neck supple, throat within normal limits Cardiovascular: Normal rate, regular rhythm and normal heart sounds.  No murmur heard. No BLE edema. Pulmonary/Chest: Effort normal and breath sounds normal. No respiratory distress. Abdominal: Soft.  There is no tenderness. Psychiatric: Patient has a normal mood and affect. behavior is normal. Judgment and thought content normal.   Recent Results (from the past 2160 hour(s))  POCT HgB A1C     Status: Abnormal   Collection Time: 03/08/21  3:24 PM  Result Value Ref Range   Hemoglobin A1C 5.8 (A) 4.0 - 5.6 %   HbA1c POC (<> result, manual entry)     HbA1c, POC (prediabetic range)     HbA1c, POC (controlled diabetic range)       PHQ2/9: Depression screen [facility] 2/9 03/08/2021 12/01/2020 11/26/2020 09/01/2020 05/20/2020  Decreased Interest 0 0 0 0 0  Down, Depressed, Hopeless 0 0 0 0 0  PHQ - 2 Score 0 0 0 0 0  Altered sleeping 0 0 - - -  Tired, decreased energy 0 0 - - -  Change in appetite 0 0 - - -  Feeling bad or failure about yourself  0 0 - - -   Trouble concentrating 0 0 - - -  Moving slowly or fidgety/restless 0 0 - - -  Suicidal thoughts 0 0 - - -  PHQ-9 Score 0 0 - - -  Difficult doing work/chores - Not difficult at all - - -  Some recent data might be hidden    phq 9 is negative   Fall Risk: Fall Risk  03/08/2021 12/01/2020 11/26/2020 09/01/2020 05/20/2020  Falls in the past year? 0 0 0 0 0  Number falls in past yr: 0 0 0 0 0  Injury with Fall? 0 0 0 0 0  Risk for fall due to : No Fall Risks No Fall Risks No Fall Risks - -  Follow up Falls prevention discussed Falls prevention discussed Falls prevention discussed - -      Functional Status Survey: Is the patient deaf or have difficulty hearing?: No Does the patient have difficulty seeing, even when wearing glasses/contacts?: No Does the patient have difficulty concentrating, remembering, or making decisions?: No Does the patient have difficulty walking or climbing stairs?: No Does the patient have difficulty dressing or bathing?: No Does the patient have difficulty doing errands alone such as visiting a doctor's office or shopping?: No    Assessment & Plan  1. Controlled type 2 diabetes mellitus with microalbuminuria, without long-term current use of insulin (HCC)  - POCT HgB A1C - glipiZIDE (GLUCOTROL XL) 5 MG 24 hr tablet; Take 1 tablet (5 mg total) by mouth daily with breakfast.  Dispense: 90 tablet; Refill: 1  2. Hypertension associated with type 2 diabetes mellitus (HCC)  - carvedilol (COREG) 12.5 MG tablet; Take 1 tablet (12.5 mg total) by mouth 2 (two) times daily with a meal.  Dispense: 180 tablet; Refill: 0  3. End stage kidney disease ([place])   4. Thoracic aortic atherosclerosis ([place])   5. Vitamin D deficiency   6. Moderate persistent reactive airway disease without complication  7. Hyperparathyroidism due to renal insufficiency ([place])   8. Dyslipidemia   9. Focal segmental glomerulosclerosis   10. Hypertension, essential, benign  -  carvedilol (COREG) 12.5 MG tablet; Take 1 tablet (12.5 mg total) by mouth 2 (two) times daily with a meal.  Dispense: 180 tablet; Refill: 0   11. B12 deficiency   12. Controlled gout  - febuxostat (ULORIC) 40 MG tablet; TAKE 1 TABLET BY MOUTH ONCE DAILY FOR  GOUT  (TAKE  IN  THE  PLACE  OF  ALLOPURINOL)  Dispense: 90 tablet; Refill: 1

## 2021-03-08 ENCOUNTER — Ambulatory Visit: Payer: Commercial Managed Care - PPO | Admitting: Family Medicine

## 2021-03-08 ENCOUNTER — Other Ambulatory Visit: Payer: Self-pay

## 2021-03-08 ENCOUNTER — Encounter: Payer: Self-pay | Admitting: Family Medicine

## 2021-03-08 VITALS — BP 134/78 | HR 77 | Temp 98.0°F | Resp 16 | Ht 67.0 in | Wt 211.0 lb

## 2021-03-08 DIAGNOSIS — I700 Atherosclerosis of aorta: Secondary | ICD-10-CM

## 2021-03-08 DIAGNOSIS — E1159 Type 2 diabetes mellitus with other circulatory complications: Secondary | ICD-10-CM

## 2021-03-08 DIAGNOSIS — I152 Hypertension secondary to endocrine disorders: Secondary | ICD-10-CM

## 2021-03-08 DIAGNOSIS — I10 Essential (primary) hypertension: Secondary | ICD-10-CM

## 2021-03-08 DIAGNOSIS — R809 Proteinuria, unspecified: Secondary | ICD-10-CM | POA: Diagnosis not present

## 2021-03-08 DIAGNOSIS — J4540 Moderate persistent asthma, uncomplicated: Secondary | ICD-10-CM

## 2021-03-08 DIAGNOSIS — E785 Hyperlipidemia, unspecified: Secondary | ICD-10-CM

## 2021-03-08 DIAGNOSIS — E538 Deficiency of other specified B group vitamins: Secondary | ICD-10-CM

## 2021-03-08 DIAGNOSIS — E559 Vitamin D deficiency, unspecified: Secondary | ICD-10-CM

## 2021-03-08 DIAGNOSIS — N2581 Secondary hyperparathyroidism of renal origin: Secondary | ICD-10-CM

## 2021-03-08 DIAGNOSIS — M109 Gout, unspecified: Secondary | ICD-10-CM

## 2021-03-08 DIAGNOSIS — E1129 Type 2 diabetes mellitus with other diabetic kidney complication: Secondary | ICD-10-CM | POA: Diagnosis not present

## 2021-03-08 DIAGNOSIS — N186 End stage renal disease: Secondary | ICD-10-CM

## 2021-03-08 DIAGNOSIS — N051 Unspecified nephritic syndrome with focal and segmental glomerular lesions: Secondary | ICD-10-CM

## 2021-03-08 DIAGNOSIS — J4530 Mild persistent asthma, uncomplicated: Secondary | ICD-10-CM

## 2021-03-08 LAB — POCT GLYCOSYLATED HEMOGLOBIN (HGB A1C): Hemoglobin A1C: 5.8 % — AB (ref 4.0–5.6)

## 2021-03-08 MED ORDER — CARVEDILOL 12.5 MG PO TABS: 12.5000 mg | ORAL_TABLET | Freq: Two times a day (BID) | ORAL | 0 refills | Status: DC

## 2021-03-08 MED ORDER — FEBUXOSTAT 40 MG PO TABS: ORAL_TABLET | ORAL | 1 refills | Status: DC

## 2021-03-08 MED ORDER — GLIPIZIDE ER 5 MG PO TB24: 5.0000 mg | ORAL_TABLET | Freq: Every day | ORAL | 1 refills | Status: DC

## 2021-03-23 LAB — COLOGUARD: Cologuard: POSITIVE — AB

## 2021-03-29 ENCOUNTER — Other Ambulatory Visit: Payer: Self-pay

## 2021-03-29 DIAGNOSIS — R195 Other fecal abnormalities: Secondary | ICD-10-CM

## 2021-03-29 DIAGNOSIS — Z1211 Encounter for screening for malignant neoplasm of colon: Secondary | ICD-10-CM

## 2021-03-29 LAB — COLOGUARD: COLOGUARD: POSITIVE — AB

## 2021-03-30 ENCOUNTER — Telehealth: Payer: Self-pay

## 2021-03-30 ENCOUNTER — Other Ambulatory Visit: Payer: Self-pay

## 2021-03-30 DIAGNOSIS — Z86010 Personal history of colonic polyps: Secondary | ICD-10-CM

## 2021-03-30 MED ORDER — PEG 3350-KCL-NA BICARB-NACL 420 G PO SOLR: 4000.0000 mL | Freq: Once | ORAL | 0 refills | Status: AC

## 2021-03-30 NOTE — Telephone Encounter (Signed)
CALLED PATIENT NO ANSWER LEFT VOICEMAIL FOR A CALL BACK ? ?

## 2021-03-30 NOTE — Progress Notes (Signed)
Gastroenterology Pre-Procedure Review  Request Date: 05/04/2021 Requesting Physician: Dr. Allen Norris  PATIENT REVIEW QUESTIONS: The patient responded to the following health history questions as indicated:    1. Are you having any GI issues? no 2. Do you have a personal history of Polyps? yes (last colonoscopy) 3. Do you have a family history of Colon Cancer or Polyps? no 4. Diabetes Mellitus? yes (type2) 5. Joint replacements in the past 12 months?no 6. Major health problems in the past 3 months?no 7. Any artificial heart valves, MVP, or defibrillator?no    MEDICATIONS & ALLERGIES:    Patient reports the following regarding taking any anticoagulation/antiplatelet therapy:   Plavix, Coumadin, Eliquis, Xarelto, Lovenox, Pradaxa, Brilinta, or Effient? no Aspirin? yes (asprin 81 mg)  Patient confirms/reports the following medications:  Current Outpatient Medications  Medication Sig Dispense Refill   albuterol (VENTOLIN HFA) 108 (90 Base) MCG/ACT inhaler Inhale 2 puffs into the lungs every 6 (six) hours as needed for wheezing or shortness of breath. 18 g 0   amLODipine-olmesartan (AZOR) 10-40 MG tablet Take 1 tablet by mouth daily. 90 tablet 1   aspirin 81 MG EC tablet Take 81 mg by mouth daily.     atorvastatin (LIPITOR) 40 MG tablet Take by mouth.     carvedilol (COREG) 12.5 MG tablet Take 1 tablet (12.5 mg total) by mouth 2 (two) times daily with a meal. 180 tablet 0   dapagliflozin propanediol (FARXIGA) 5 MG TABS tablet Take 1 tablet (5 mg total) by mouth daily before breakfast. 90 tablet 1   febuxostat (ULORIC) 40 MG tablet TAKE 1 TABLET BY MOUTH ONCE DAILY FOR  GOUT  (TAKE  IN  THE  PLACE  OF  ALLOPURINOL) 90 tablet 1   fluticasone (FLONASE) 50 MCG/ACT nasal spray Place 2 sprays into both nostrils daily. 16 g 6   Fluticasone-Umeclidin-Vilant (TRELEGY ELLIPTA) 100-62.5-25 MCG/INH AEPB Inhale 1 puff into the lungs daily. 1 each 11   glipiZIDE (GLUCOTROL XL) 5 MG 24 hr tablet Take 1 tablet  (5 mg total) by mouth daily with breakfast. 90 tablet 1   rosuvastatin (CRESTOR) 40 MG tablet Take 1 tablet (40 mg total) by mouth daily. 90 tablet 1   Vitamin D, Ergocalciferol, (DRISDOL) 1.25 MG (50000 UNIT) CAPS capsule Take 1 capsule (50,000 Units total) by mouth every 7 (seven) days. 12 capsule 1   No current facility-administered medications for this visit.    Patient confirms/reports the following allergies:  No Known Allergies  No orders of the defined types were placed in this encounter.   AUTHORIZATION INFORMATION Primary Insurance: 1D#: Group #:  Secondary Insurance: 1D#: Group #:  SCHEDULE INFORMATION: Date: 05/04/2021 Time: Location:[facility]

## 2021-03-31 ENCOUNTER — Telehealth: Payer: Self-pay

## 2021-03-31 NOTE — Telephone Encounter (Signed)
Copied from [place] [id number]. Topic: General - Other >> Mar 31, 2021  9:45 AM Loma Boston wrote: [phone] pt result is abnormal [employer] calling in verbal.. faxed also

## 2021-04-16 ENCOUNTER — Other Ambulatory Visit: Payer: Self-pay

## 2021-04-16 ENCOUNTER — Ambulatory Visit
Admission: RE | Admit: 2021-04-16 | Discharge: 2021-04-16 | Disposition: A | Discharge status: Home / Self Care | Payer: Commercial Managed Care - PPO | Source: Ambulatory Visit | Attending: Family Medicine | Admitting: Family Medicine

## 2021-04-16 DIAGNOSIS — Z1231 Encounter for screening mammogram for malignant neoplasm of breast: Secondary | ICD-10-CM | POA: Diagnosis present

## 2021-04-29 ENCOUNTER — Ambulatory Visit (INDEPENDENT_AMBULATORY_CARE_PROVIDER_SITE_OTHER): Payer: Commercial Managed Care - PPO | Admitting: Nurse Practitioner

## 2021-04-29 ENCOUNTER — Ambulatory Visit: Payer: Self-pay

## 2021-04-29 ENCOUNTER — Other Ambulatory Visit: Payer: Self-pay | Admitting: Family Medicine

## 2021-04-29 ENCOUNTER — Other Ambulatory Visit: Payer: Self-pay

## 2021-04-29 ENCOUNTER — Encounter: Payer: Self-pay | Admitting: Nurse Practitioner

## 2021-04-29 VITALS — BP 154/72 | HR 75 | Temp 98.3°F | Resp 16 | Ht 67.0 in | Wt 213.9 lb

## 2021-04-29 DIAGNOSIS — E1159 Type 2 diabetes mellitus with other circulatory complications: Secondary | ICD-10-CM | POA: Diagnosis not present

## 2021-04-29 DIAGNOSIS — N184 Chronic kidney disease, stage 4 (severe): Secondary | ICD-10-CM

## 2021-04-29 DIAGNOSIS — E559 Vitamin D deficiency, unspecified: Secondary | ICD-10-CM

## 2021-04-29 DIAGNOSIS — I152 Hypertension secondary to endocrine disorders: Secondary | ICD-10-CM

## 2021-04-29 MED ORDER — HYDRALAZINE HCL 10 MG PO TABS: 10.0000 mg | ORAL_TABLET | Freq: Three times a day (TID) | ORAL | 0 refills | Status: DC | PRN

## 2021-04-29 NOTE — Progress Notes (Signed)
? ?  BP (!) 154/72 (Cuff Size: Large)   Pulse 75   Temp 98.3 ?F (36.8 ?C)   Resp 16   Ht '5\' 7"'$  (1.702 m)   Wt 213 lb 14.4 oz (97 kg)   SpO2 99%   BMI 33.50 kg/m?   ? ?Subjective:  ? ? Patient ID: Cheryl Barr, female    DOB: Mar 11, 1955, 66 y.o.   MRN: [id number] ? ?HPI: ?Cheryl Barr is a 66 y.o. female ? ?Chief Complaint  ?Patient presents with  ? Hypertension  ?  Bp running high w/ headache  ? ?HTN: She says her blood pressure has been running high.  Her blood pressure at home was 182/90, 189/104.  Her blood pressure in the office is 178/98, retake was 154/72.  She says she has had a headache for about a week.  She says she took some tylenol and that helped.  She denies any blurred vision, numbness or difficulty speaking. She denies any chest pain or shortness of breath.  Will give prn hydralazine for blood pressure greater than 140/90 and have her follow up with Dr. Candiss Norse with nephrology. Patient is agreeable to plan.  ? ?Relevant past medical, surgical, family and social history reviewed and updated as indicated. Interim medical history since our last visit reviewed. ?Allergies and medications reviewed and updated. ? ?Review of Systems ? ?Constitutional: Negative for fever or weight change.  ?Respiratory: Negative for cough and shortness of breath.   ?Cardiovascular: Negative for chest pain or palpitations.  ?Gastrointestinal: Negative for abdominal pain, no bowel changes.  ?Musculoskeletal: Negative for gait problem or joint swelling.  ?Skin: Negative for rash.  ?Neurological: Negative for dizziness, positive for  headache.  ?No other specific complaints in a complete review of systems (except as listed in HPI above).  ? ?   ?Objective:  ?  ?BP (!) 154/72 (Cuff Size: Large)   Pulse 75   Temp 98.3 ?F (36.8 ?C)   Resp 16   Ht '5\' 7"'$  (1.702 m)   Wt 213 lb 14.4 oz (97 kg)   SpO2 99%   BMI 33.50 kg/m?   ?Wt Readings from Last 3 Encounters:  ?04/29/21 213 lb 14.4 oz (97 kg)  ?03/08/21 211 lb (95.7 kg)   ?12/01/20 211 lb 3.2 oz (95.8 kg)  ?  ?Physical Exam ? ?Constitutional: Patient appears well-developed and well-nourished. Obese  No distress.  ?HEENT: head atraumatic, normocephalic, pupils equal and reactive to light, neck supple ?Cardiovascular: Normal rate, regular rhythm and normal heart sounds.  No murmur heard. No BLE edema. ?Pulmonary/Chest: Effort normal and breath sounds normal. No respiratory distress. ?Abdominal: Soft.  There is no tenderness. ?Psychiatric: Patient has a normal mood and affect. behavior is normal. Judgment and thought content normal.  ?Neuro: cranial nerves intact, no facial droop, equal grip strength ? ?Results for orders placed or performed in visit on 03/29/21  ?Cologuard  ?Result Value Ref Range  ? Cologuard Positive (A) Negative  ? ?   ?Assessment & Plan:  ? ?1. Hypertension associated with type 2 diabetes mellitus ([place]) ? ?-check blood pressure twice, if still elevated 140/90 take hydralazine  ?-follow up with Dr. Candiss Norse ?- hydrALAZINE (APRESOLINE) 10 MG tablet; Take 1 tablet (10 mg total) by mouth 3 (three) times daily as needed (for blood pressure greater than 140/90).  Dispense: 90 tablet; Refill: 0  ? ?Follow up plan: ?Follow up with Dr. Candiss Norse Nephrology ? ? ? ? ? ?

## 2021-04-29 NOTE — Telephone Encounter (Signed)
? ?  Chief Complaint: BP 189/104 this morning ?Symptoms: Headache ?Frequency: BP 174/84 yesterday ?Pertinent Negatives: Patient denies chest pain ?Disposition: '[]'$ ED /'[]'$ Urgent Care (no appt availability in office) / '[x]'$ Appointment(In office/virtual)/ '[]'$  [place] [facility]/ '[]'$ Home Care/ '[]'$ Refused Recommended Disposition /'[]'$ [place] [facility]/ '[]'$  Follow-up with PCP ?Additional Notes: Instructed to go to UC/ED for worsening of symptoms.  ? ?Reason for Disposition ? Systolic BP  >= 299 OR Diastolic >= 242 ? ?Answer Assessment - Initial Assessment Questions ?1. BLOOD PRESSURE: "What is the blood pressure?" "Did you take at least two measurements 5 minutes apart?" ?    189/104 ?2. ONSET: "When did you take your blood pressure?" ?    Today ?3. HOW: "How did you obtain the blood pressure?" (e.g., visiting nurse, automatic home BP monitor) ?    Home cuff ?4. HISTORY: "Do you have a history of high blood pressure?" ?    Yes ?5. MEDICATIONS: "Are you taking any medications for blood pressure?" "Have you missed any doses recently?" ?    No ?6. OTHER SYMPTOMS: "Do you have any symptoms?" (e.g., headache, chest pain, blurred vision, difficulty breathing, weakness) ?    Headache ?7. PREGNANCY: "Is there any chance you are pregnant?" "When was your last menstrual period?" ?    No ? ?Protocols used: Blood Pressure - High-A-AH ? ?

## 2021-05-04 ENCOUNTER — Other Ambulatory Visit: Payer: Self-pay

## 2021-05-04 ENCOUNTER — Ambulatory Visit: Payer: Commercial Managed Care - PPO | Admitting: Certified Registered"

## 2021-05-04 ENCOUNTER — Encounter: Payer: Self-pay | Admitting: Gastroenterology

## 2021-05-04 ENCOUNTER — Encounter
Admission: RE | Disposition: A | Discharge status: Home / Self Care | Payer: Self-pay | Source: Home / Self Care | Attending: Gastroenterology

## 2021-05-04 ENCOUNTER — Ambulatory Visit
Admission: RE | Admit: 2021-05-04 | Discharge: 2021-05-04 | Disposition: A | Discharge status: Home / Self Care | Payer: Commercial Managed Care - PPO | Attending: Gastroenterology | Admitting: Gastroenterology

## 2021-05-04 DIAGNOSIS — D649 Anemia, unspecified: Secondary | ICD-10-CM | POA: Insufficient documentation

## 2021-05-04 DIAGNOSIS — Z86010 Personal history of colonic polyps: Secondary | ICD-10-CM

## 2021-05-04 DIAGNOSIS — D759 Disease of blood and blood-forming organs, unspecified: Secondary | ICD-10-CM | POA: Diagnosis not present

## 2021-05-04 DIAGNOSIS — D122 Benign neoplasm of ascending colon: Secondary | ICD-10-CM | POA: Diagnosis not present

## 2021-05-04 DIAGNOSIS — E119 Type 2 diabetes mellitus without complications: Secondary | ICD-10-CM | POA: Insufficient documentation

## 2021-05-04 DIAGNOSIS — K5730 Diverticulosis of large intestine without perforation or abscess without bleeding: Secondary | ICD-10-CM | POA: Diagnosis not present

## 2021-05-04 DIAGNOSIS — E669 Obesity, unspecified: Secondary | ICD-10-CM | POA: Diagnosis not present

## 2021-05-04 DIAGNOSIS — I10 Essential (primary) hypertension: Secondary | ICD-10-CM | POA: Diagnosis not present

## 2021-05-04 DIAGNOSIS — Z6833 Body mass index (BMI) 33.0-33.9, adult: Secondary | ICD-10-CM | POA: Insufficient documentation

## 2021-05-04 DIAGNOSIS — K640 First degree hemorrhoids: Secondary | ICD-10-CM | POA: Insufficient documentation

## 2021-05-04 DIAGNOSIS — J45909 Unspecified asthma, uncomplicated: Secondary | ICD-10-CM | POA: Insufficient documentation

## 2021-05-04 DIAGNOSIS — D123 Benign neoplasm of transverse colon: Secondary | ICD-10-CM | POA: Insufficient documentation

## 2021-05-04 DIAGNOSIS — Z87891 Personal history of nicotine dependence: Secondary | ICD-10-CM | POA: Diagnosis not present

## 2021-05-04 DIAGNOSIS — N289 Disorder of kidney and ureter, unspecified: Secondary | ICD-10-CM | POA: Diagnosis not present

## 2021-05-04 DIAGNOSIS — K635 Polyp of colon: Secondary | ICD-10-CM | POA: Diagnosis not present

## 2021-05-04 DIAGNOSIS — Z09 Encounter for follow-up examination after completed treatment for conditions other than malignant neoplasm: Secondary | ICD-10-CM | POA: Diagnosis present

## 2021-05-04 HISTORY — PX: COLONOSCOPY WITH PROPOFOL: SHX5780

## 2021-05-04 LAB — GLUCOSE, CAPILLARY: Glucose-Capillary: 92 mg/dL (ref 70–99)

## 2021-05-04 SURGERY — COLONOSCOPY WITH PROPOFOL
Anesthesia: General

## 2021-05-04 MED ORDER — PROPOFOL 10 MG/ML IV BOLUS
INTRAVENOUS | Status: DC | PRN
  Administered 2021-05-04: 100 mg via INTRAVENOUS

## 2021-05-04 MED ORDER — LIDOCAINE HCL (CARDIAC) PF 100 MG/5ML IV SOSY
PREFILLED_SYRINGE | INTRAVENOUS | Status: DC | PRN
  Administered 2021-05-04: 40 mg via INTRAVENOUS

## 2021-05-04 MED ORDER — SODIUM CHLORIDE 0.9 % IV SOLN: INTRAVENOUS | Status: DC

## 2021-05-04 MED ORDER — PROPOFOL 500 MG/50ML IV EMUL
INTRAVENOUS | Status: AC
  Filled 2021-05-04: qty 150

## 2021-05-04 MED ORDER — PROPOFOL 500 MG/50ML IV EMUL
INTRAVENOUS | Status: AC
Start: 2021-05-04 — End: ?
  Filled 2021-05-04: qty 50

## 2021-05-04 MED ORDER — PROPOFOL 500 MG/50ML IV EMUL
INTRAVENOUS | Status: DC | PRN
  Administered 2021-05-04: 125 ug/kg/min via INTRAVENOUS

## 2021-05-04 NOTE — Transfer of Care (Signed)
Immediate Anesthesia Transfer of Care Note ? ?Patient: Cheryl Barr ? ?Procedure(s) Performed: COLONOSCOPY WITH PROPOFOL ? ?Patient Location: PACU ? ?Anesthesia Type:General ? ?Level of Consciousness: drowsy ? ?Airway & Oxygen Therapy: Patient Spontanous Breathing ? ?Post-op Assessment: Report given to RN ? ?Post vital signs: stable ? ?Last Vitals:  ?Vitals Value Taken Time  ?BP    ?Temp    ?Pulse    ?Resp    ?SpO2    ? ? ?Last Pain:  ?Vitals:  ? 05/04/21 0737  ?TempSrc: Temporal  ?PainSc: 0-No pain  ?   ? ?  ? ?Complications: No notable events documented. ?

## 2021-05-04 NOTE — Op Note (Signed)
[facility] ?Gastroenterology ?Patient Name: Cheryl Barr ?Procedure Date: 05/04/2021 8:06 AM ?MRN: [id number] ?Account #: [id number] ?Date of Birth: [birth year] ?Admit Type: Outpatient ?Age: 66 ?Room: [facility] ENDO ROOM 4 ?Gender: Female ?Note Status: Finalized ?Instrument Name: Colonoscope [id number] ?Procedure:             Colonoscopy ?Indications:           High risk colon cancer surveillance: Personal history  ?                       of colonic polyps ?Providers:             Lucilla Lame MD, MD ?Referring MD:          Bethena Roys. Ancil Boozer, MD (Referring MD) ?Medicines:             Propofol per Anesthesia ?Complications:         No immediate complications. ?Procedure:             Pre-Anesthesia Assessment: ?                       - Prior to the procedure, a History and Physical was  ?                       performed, and patient medications and allergies were  ?                       reviewed. The patient's tolerance of previous  ?                       anesthesia was also reviewed. The risks and benefits  ?                       of the procedure and the sedation options and risks  ?                       were discussed with the patient. All questions were  ?                       answered, and informed consent was obtained. Prior  ?                       Anticoagulants: The patient has taken no previous  ?                       anticoagulant or antiplatelet agents. ASA Grade  ?                       Assessment: II - A patient with mild systemic disease.  ?                       After reviewing the risks and benefits, the patient  ?                       was deemed in satisfactory condition to undergo the  ?                       procedure. ?  After obtaining informed consent, the colonoscope was  ?                       passed under direct vision. Throughout the procedure,  ?                       the patient's blood pressure, pulse, and oxygen  ?                       saturations  were monitored continuously. The  ?                       Colonoscope was introduced through the anus and  ?                       advanced to the the cecum, identified by appendiceal  ?                       orifice and ileocecal valve. The colonoscopy was  ?                       performed without difficulty. The patient tolerated  ?                       the procedure well. The quality of the bowel  ?                       preparation was adequate to identify polyps. ?Findings: ?     The perianal and digital rectal examinations were normal. ?     Two sessile polyps were found in the ascending colon. The polyps were 3  ?     to 6 mm in size. These polyps were removed with a cold snare. Resection  ?     and retrieval were complete. ?     A 3 mm polyp was found in the transverse colon. The polyp was sessile.  ?     The polyp was removed with a cold snare. Resection and retrieval were  ?     complete. ?     A 2 mm polyp was found in the sigmoid colon. The polyp was sessile. The  ?     polyp was removed with a cold snare. Resection and retrieval were  ?     complete. ?     Many small-mouthed diverticula were found in the entire colon. ?     Non-bleeding internal hemorrhoids were found during retroflexion. The  ?     hemorrhoids were Grade I (internal hemorrhoids that do not prolapse). ?Impression:            - Two 3 to 6 mm polyps in the ascending colon, removed  ?                       with a cold snare. Resected and retrieved. ?                       - One 3 mm polyp in the transverse colon, removed with  ?                       a cold snare. Resected and retrieved. ?                       -  One 2 mm polyp in the sigmoid colon, removed with a  ?                       cold snare. Resected and retrieved. ?                       - Diverticulosis in the entire examined colon. ?                       - Non-bleeding internal hemorrhoids. ?Recommendation:        - Discharge patient to home. ?                       - Resume  previous diet. ?                       - Continue present medications. ?                       - Await pathology results. ?                       - Repeat colonoscopy in 5 years for surveillance. ?Procedure Code(s):     --- Professional --- ?                       [phone], Colonoscopy, flexible; with removal of  ?                       tumor(s), polyp(s), or other lesion(s) by snare  ?                       technique ?Diagnosis Code(s):     --- Professional --- ?                       Z86.010, Personal history of colonic polyps ?                       K63.5, Polyp of colon ?CPT copyright 2019 American Medical Association. All rights reserved. ?The codes documented in this report are preliminary and upon coder review may  ?be revised to meet current compliance requirements. ?Lucilla Lame MD, MD ?05/04/2021 8:30:19 AM ?This report has been signed electronically. ?Number of Addenda: 0 ?Note Initiated On: 05/04/2021 8:06 AM ?Scope Withdrawal Time: 0 hours 14 minutes 13 seconds  ?Total Procedure Duration: 0 hours 16 minutes 59 seconds  ?Estimated Blood Loss:  Estimated blood loss: none. ?     [facility] ?

## 2021-05-04 NOTE — H&P (Signed)
? ?Lucilla Lame, MD [facility] ?[address] ?[place], [place] [address] ?Phone:[phone] ?Fax : [phone] ? ?Primary Care Physician:  Steele Sizer, MD ?Primary Gastroenterologist:  Dr. Allen Norris ? ?Pre-Procedure History & Physical: ?HPI:  Cheryl Barr is a 66 y.o. female is here for an colonoscopy. ?  ?Past Medical History:  ?Diagnosis Date  ? Arthritis   ? Colon polyp 2009  ? Depression   ? Diabetes mellitus   ? Type 2  ? Fibromyalgia   ? Gout   ? Hyperlipidemia   ? Hypertension   ? Insomnia   ? Menopause   ? Vitamin D deficiency   ? ? ?Past Surgical History:  ?Procedure Laterality Date  ? BREAST BIOPSY Left 05/24/2013  ? neg  ? BREAST EXCISIONAL BIOPSY Left 06/21/2013  ? neg/ dr. Bary Castilla  ? BREAST SURGERY Left 2015  ? COLONOSCOPY  2009  ? 1 polyp found. Dr. Roselyn Reef  ? HERNIA REPAIR  10/09/2014  ? Ventral/umbilical hernia repair with 10 x 10 cm [employer] mesh in preperitoneal position.  ? INSERTION OF MESH N/A 10/09/2014  ? Procedure: INSERTION OF MESH;  Surgeon: Robert Bellow, MD;  Location: [facility] ORS;  Service: General;  Laterality: N/A;  ? KNEE SURGERY Left 01/14/2009  ? STRABISMUS SURGERY    ? Repair as a child  ? TUBAL LIGATION  1990  ? VENTRAL HERNIA REPAIR N/A 10/09/2014  ? Procedure: HERNIA REPAIR VENTRAL ADULT;  Surgeon: Robert Bellow, MD;  Location: [facility] ORS;  Service: General;  Laterality: N/A;  ? ? ?Prior to Admission medications   ?Medication Sig Start Date End Date Taking? Authorizing Provider  ?albuterol (VENTOLIN HFA) 108 (90 Base) MCG/ACT inhaler Inhale 2 puffs into the lungs every 6 (six) hours as needed for wheezing or shortness of breath. 02/20/20  Yes Sowles, Drue Stager, MD  ?amLODipine-olmesartan (AZOR) 10-40 MG tablet Take 1 tablet by mouth daily. 12/01/20  Yes Steele Sizer, MD  ?aspirin 81 MG EC tablet Take 81 mg by mouth daily.   Yes [provider]  ?carvedilol (COREG) 12.5 MG tablet Take 1 tablet (12.5 mg total) by mouth 2 (two) times daily with a meal. 03/08/21  Yes Sowles,  Drue Stager, MD  ?FARXIGA 5 MG TABS tablet TAKE 1 TABLET BY MOUTH ONCE DAILY BEFORE BREAKFAST 04/29/21  Yes Sowles, Drue Stager, MD  ?febuxostat (ULORIC) 40 MG tablet TAKE 1 TABLET BY MOUTH ONCE DAILY FOR  GOUT  (TAKE  IN  THE  PLACE  OF  ALLOPURINOL) 03/08/21  Yes Sowles, Drue Stager, MD  ?fluticasone (FLONASE) 50 MCG/ACT nasal spray Place 2 sprays into both nostrils daily. 12/01/20  Yes Sowles, Drue Stager, MD  ?Fluticasone-Umeclidin-Vilant (TRELEGY ELLIPTA) 100-62.5-25 MCG/INH AEPB Inhale 1 puff into the lungs daily. 05/20/20  Yes Sowles, Drue Stager, MD  ?glipiZIDE (GLUCOTROL XL) 5 MG 24 hr tablet Take 1 tablet (5 mg total) by mouth daily with breakfast. 03/08/21  Yes Sowles, Drue Stager, MD  ?hydrALAZINE (APRESOLINE) 10 MG tablet Take 1 tablet (10 mg total) by mouth 3 (three) times daily as needed (for blood pressure greater than 140/90). 04/29/21  Yes Bo Merino, FNP  ?rosuvastatin (CRESTOR) 40 MG tablet Take 1 tablet (40 mg total) by mouth daily. 12/01/20  Yes Sowles, Drue Stager, MD  ?Vitamin D, Ergocalciferol, (DRISDOL) 1.25 MG (50000 UNIT) CAPS capsule Take 1 capsule by mouth once a week 04/29/21  Yes Sowles, Drue Stager, MD  ?atorvastatin (LIPITOR) 40 MG tablet Take by mouth. ?Patient not taking: Reported on 05/04/2021    [provider]  ? ? ?  Allergies as of 03/30/2021  ? (No Known Allergies)  ? ? ?Family History  ?Problem Relation Age of Onset  ? Hypertension Mother   ? Diabetes Mother   ? Heart attack Father   ? Coronary artery disease Father   ? Breast cancer Father 32  ? Diabetes Sister   ? Gout Brother   ? Heart disease Brother   ? Allergies Son   ?     Food and Bee Stings  ? ? ?Social History  ? ?Socioeconomic History  ? Marital status: Widowed  ?  Spouse name: Not on file  ? Number of children: 3  ? Years of education: 12th Grade  ? Highest education level: Not on file  ?Occupational History  ? Occupation: kitchen prep   ?  Employer: [employer]  ?Tobacco Use  ? Smoking status: Former  ?  Types: Cigarettes  ?   Quit date: 10/02/1983  ?  Years since quitting: 37.6  ? Smokeless tobacco: Never  ?Vaping Use  ? Vaping Use: Never used  ?Substance and Sexual Activity  ? Alcohol use: No  ?  Alcohol/week: 0.0 standard drinks  ? Drug use: No  ? Sexual activity: Yes  ?  Partners: Male  ?  Birth control/protection: None  ?  Comment: declined condoms   ?Other Topics Concern  ? Not on file  ?Social History Narrative  ? Widowed. Living with boyfriend   ? No regular exercise.  ? Two other brothers one adopted since 22 days old, died in 05/22/2020 with liver cancer, also has a foster brother   ? ?Social Determinants of Health  ? ?Financial Resource Strain: Low Risk   ? Difficulty of Paying Living Expenses: Not hard at all  ?Food Insecurity: No Food Insecurity  ? Worried About Charity fundraiser in the Last Year: Never true  ? Ran Out of Food in the Last Year: Never true  ?Transportation Needs: No Transportation Needs  ? Lack of Transportation (Medical): No  ? Lack of Transportation (Non-Medical): No  ?Physical Activity: Insufficiently Active  ? Days of Exercise per Week: 5 days  ? Minutes of Exercise per Session: 20 min  ?Stress: No Stress Concern Present  ? Feeling of Stress : Not at all  ?Social Connections: Moderately Isolated  ? Frequency of Communication with Friends and Family: Once a week  ? Frequency of Social Gatherings with Friends and Family: Once a week  ? Attends Religious Services: 1 to 4 times per year  ? Active Member of Clubs or Organizations: No  ? Attends Archivist Meetings: Never  ? Marital Status: Living with partner  ?Intimate Partner Violence: Not At Risk  ? Fear of Current or Ex-Partner: No  ? Emotionally Abused: No  ? Physically Abused: No  ? Sexually Abused: No  ? ? ?Review of Systems: ?See HPI, otherwise negative ROS ? ?Physical Exam: ?BP (!) 158/94   Pulse 62   Temp (!) 97.5 ?F (36.4 ?C) (Temporal)   Resp 16   Ht '5\' 7"'$  (1.702 m)   Wt 97 kg   SpO2 100%   BMI 33.49 kg/m?  ?General:   Alert,  pleasant  and cooperative in NAD ?Head:  Normocephalic and atraumatic. ?Neck:  Supple; no masses or thyromegaly. ?Lungs:  Clear throughout to auscultation.    ?Heart:  Regular rate and rhythm. ?Abdomen:  Soft, nontender and nondistended. Normal bowel sounds, without guarding, and without rebound.   ?Neurologic:  Alert and  oriented x4;  grossly normal neurologically. ? ?Impression/Plan: ?Cheryl Barr is here for an colonoscopy to be performed for a history of adenomatous polyps on 2019 ? ? ?Risks, benefits, limitations, and alternatives regarding  colonoscopy have been reviewed with the patient.  Questions have been answered.  All parties agreeable. ? ? ?Lucilla Lame, MD  05/04/2021, 8:08 AM ?

## 2021-05-04 NOTE — Anesthesia Postprocedure Evaluation (Signed)
Anesthesia Post Note ? ?Patient: Cheryl Barr ? ?Procedure(s) Performed: COLONOSCOPY WITH PROPOFOL ? ?Patient location during evaluation: PACU ?Anesthesia Type: General ?Level of consciousness: awake and alert ?Pain management: pain level controlled ?Vital Signs Assessment: post-procedure vital signs reviewed and stable ?Respiratory status: spontaneous breathing, nonlabored ventilation and respiratory function stable ?Cardiovascular status: blood pressure returned to baseline and stable ?Postop Assessment: no apparent nausea or vomiting ?Anesthetic complications: no ? ? ?No notable events documented. ? ? ?Last Vitals:  ?Vitals:  ? 05/04/21 0843 05/04/21 0853  ?BP: (!) 146/89 (!) 169/97  ?Pulse:    ?Resp:    ?Temp:    ?SpO2:    ?  ?Last Pain:  ?Vitals:  ? 05/04/21 0853  ?TempSrc:   ?PainSc: 0-No pain  ? ? ?  ?  ?  ?  ?  ?  ? ?[place] Ouch ? ? ? ? ?

## 2021-05-04 NOTE — Anesthesia Preprocedure Evaluation (Addendum)
Anesthesia Evaluation  ?Patient identified by MRN, date of birth, ID band ?Patient awake ? ? ? ?Reviewed: ?Allergy & Precautions, NPO status , Patient's Chart, lab work & pertinent test results ? ?Airway ?Mallampati: III ? ? ? ? ? ? Dental ? ?(+) Partial Upper, Poor Dentition, Missing ?  ?Pulmonary ?former smoker,  ?Reactive airway disease ?  ?Pulmonary exam normal ? ? ? ? ? ? ? Cardiovascular ?hypertension, Normal cardiovascular exam ? ? ?  ?Neuro/Psych ?negative neurological ROS ? negative psych ROS  ? GI/Hepatic ?negative GI ROS, Neg liver ROS,   ?Endo/Other  ?diabetes, Type 2 ? Renal/GU ?Renal InsufficiencyRenal disease  ?negative genitourinary ?  ?Musculoskeletal ?negative musculoskeletal ROS ?(+)  ? Abdominal ?(+) + obese,   ?Peds ?negative pediatric ROS ?(+)  Hematology ? ?(+) Blood dyscrasia, anemia ,   ?Anesthesia Other Findings ? ? Reproductive/Obstetrics ?negative OB ROS ? ?  ? ? ? ? ? ? ? ? ? ? ? ? ? ?  ?  ? ? ? ? ? ? ? ?Anesthesia Physical ?Anesthesia Plan ? ?ASA: 3 ? ?Anesthesia Plan: General  ? ?Post-op Pain Management:   ? ?Induction: Intravenous ? ?PONV Risk Score and Plan: Propofol infusion and TIVA ? ?Airway Management Planned: Natural Airway and Simple Face Mask ? ?Additional Equipment:  ? ?Intra-op Plan:  ? ?Post-operative Plan:  ? ?Informed Consent: I have reviewed the patients History and Physical, chart, labs and discussed the procedure including the risks, benefits and alternatives for the proposed anesthesia with the patient or authorized representative who has indicated his/her understanding and acceptance.  ? ? ? ?Dental advisory given ? ?Plan Discussed with: CRNA and Anesthesiologist ? ?Anesthesia Plan Comments:   ? ? ? ? ? ? ?Anesthesia Quick Evaluation ? ?

## 2021-05-05 ENCOUNTER — Encounter: Payer: Self-pay | Admitting: Gastroenterology

## 2021-05-05 NOTE — Progress Notes (Signed)
Post Call follow-up on 05-05-2021 ?Spoke with the patient today following her Colonoscopy 05-04-2021 with Dr Allen Norris.  She voices concern regarding 5-6 episodes of thick, dark, burgundy colored blood with her bowel movements since discharge.  She states that she is not experiencing any abdominal pain, nausea or vomiting and is tolerating her diet well.  She notes that the dark blood is present when she wipes and in drops of dark blood and clots in the toilet bowl; the water in the toilet remains clear and that there is no bright red blood noted at all.  I discussed with Dr Vicente Males, the on-call MD for [facility].  He reviewed her colonoscopy and noted the size of the polyps removed.  He would like for the patient to continue to observe for any increase in the dark blood, the clots, or bright red bleeding and if changes are noted or patient has further questions or concerns, she is to call the office to reach the on-call physician and/or report to the ER for evaluation.  Patient states she agrees and is comfortable with this plan. ?

## 2021-05-06 LAB — SURGICAL PATHOLOGY

## 2021-05-07 ENCOUNTER — Encounter: Payer: Self-pay | Admitting: Gastroenterology

## 2021-06-05 ENCOUNTER — Other Ambulatory Visit: Payer: Self-pay | Admitting: Family Medicine

## 2021-06-05 DIAGNOSIS — E559 Vitamin D deficiency, unspecified: Secondary | ICD-10-CM

## 2021-07-05 NOTE — Progress Notes (Unsigned)
Name: Abbey Veith   MRN: [id number]    DOB: 15-May-1955   Date:07/06/2021       Progress Note  Subjective  Chief Complaint  Follow Up  HPI  HTN: compliant with medication, Azor, and carvedilol.  She denies side effects, no chest pain, palpitationor dizziness, she has SOB but stable. BP at goal today  but seems to be elevated when she goes to nephrologist, he added hydralazine during her last visit and she has been taking it TID as prescribed    Hyperlipidemia/Atherosclerosis of aorta : LDL done  7/22 was 65 , continue Atorvastatin '40mg'$  , we will recheck levels next visit    DMII with CKI stage V and also HTN: urine micro has been high and GFR low, under the care of Dr. Candiss Norse,  kidney biopsy showed glomerulonephritis She is on  aspirin,  she has symptoms of meralgia paresthetica on right side for a long time, she was on Lyrica and stopped taking it again because not efficatious. She denies polyphagia, but has noticed polydipsia and polyuria.  She is currently on Farxiga low dose for kidney and glipizide also low dose and no hypoglycemia, A1C is at goal today at 6.4 %    Gout: . she is on Uloric now, and under control , last uric acid was at goal   RAD: seen by Dr. 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 She has chronic dry cough, she was given trelegy but stopped due to cost and because symptoms had improved.. Stable SOB with activity  Denies cough or wheezing at this time   Atherosclerosis of aorta: continue Crestor, and also on baby aspirin daily , last LDL was at goal , down to 65 . Continue medication   Vitamin B12 deficiency: she is taking otc supplementation.   Chronic kidney disease: she is seeing Dr. Candiss Norse, now is CKI stage V  has secondary hyperparathyroidism , renal biopsy showed segmental glomerulonephritis . No pruritis, normal urine output  Dr. 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   Anemia in chronic kidney disease 06/12/2019   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   Incomplete rotator cuff tear or rupture of left shoulder, not specified as traumatic 07/26/2016   Thoracic aortic atherosclerosis ([place]) 01/12/2016   DDD (degenerative disc disease), 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/2016   B12 deficiency 01/11/2016   Hernia of abdominal cavity 09/15/2014   Dyslipidemia 08/10/2014   Interval gout 08/10/2014   Microalbuminuria 08/10/2014   Osteoarthritis of left knee 08/10/2014   Vitamin D deficiency 08/10/2014   Intraductal papilloma of breast 06/27/2013   Hypertension, essential, benign 09/02/2009   ABNORMAL EKG 09/02/2009    Past Surgical History:  Procedure Laterality Date   BREAST BIOPSY Left 05/24/2013   neg   BREAST EXCISIONAL BIOPSY Left 06/21/2013   neg/ dr. Bary Castilla   BREAST SURGERY Left 2015   COLONOSCOPY  2009   1 polyp found. Dr. Roselyn Reef   COLONOSCOPY WITH PROPOFOL N/A 05/04/2021   Procedure: COLONOSCOPY WITH PROPOFOL;  Surgeon: Lucilla Lame, MD;  Location: [facility] ENDOSCOPY;  Service: Endoscopy;  Laterality: N/A;   HERNIA REPAIR  17/61/6073   Ventral/umbilical hernia repair with 10 x 10 cm Atrium mesh in preperitoneal position.   INSERTION OF MESH N/A 10/09/2014   Procedure: INSERTION OF MESH;  Surgeon: Robert Bellow, MD;  Location: [facility] ORS;  Service: General;  Laterality: N/A;   KNEE SURGERY Left 01/14/2009   STRABISMUS SURGERY     Repair as a child   TUBAL LIGATION  1990   VENTRAL HERNIA REPAIR N/A 10/09/2014   Procedure: HERNIA REPAIR VENTRAL  ADULT;  Surgeon: Robert Bellow, MD;  Location: [facility] ORS;  Service: General;  Laterality: N/A;    Family History  Problem Relation Age of Onset   Hypertension Mother    Diabetes Mother    Heart attack Father    Coronary artery disease Father    Breast cancer Father 57   Diabetes Sister    Gout Brother    Heart disease Brother    Allergies Son        Haematologist and Geographical information systems officer    Social History   Tobacco Use   Smoking status: Former    Types: Cigarettes    Quit date: 10/02/1983    Years since quitting: 37.7   Smokeless tobacco: Never  Substance Use Topics   Alcohol use: No    Alcohol/week: 0.0 standard drinks     Current Outpatient Medications:    albuterol (VENTOLIN HFA) 108 (90 Base) MCG/ACT inhaler, Inhale 2 puffs into the lungs every 6 (six) hours as needed for wheezing or shortness of breath., Disp: 18 g, Rfl: 0   aspirin 81 MG EC tablet, Take 81 mg by mouth daily., Disp: , Rfl:    fluticasone (FLONASE) 50 MCG/ACT nasal spray, Place 2 sprays into both nostrils daily., Disp: 16 g, Rfl: 6   hydrALAZINE (APRESOLINE) 25 MG tablet, Take by mouth 3 (three) times daily., Disp: , Rfl:    amLODipine-olmesartan (AZOR) 10-40 MG tablet, Take 1 tablet by mouth daily., Disp: 90 tablet, Rfl: 1   carvedilol (COREG) 12.5 MG tablet, Take 1 tablet (12.5 mg total) by mouth 2 (two) times daily with a meal., Disp: 180 tablet, Rfl: 1   dapagliflozin propanediol (FARXIGA) 5 MG TABS tablet, Take 1 tablet (5 mg total) by mouth daily before breakfast., Disp: 90 tablet, Rfl: 1   febuxostat (ULORIC) 40 MG tablet, TAKE 1 TABLET BY MOUTH ONCE DAILY FOR  GOUT  (TAKE  IN  THE  PLACE  OF  ALLOPURINOL), Disp: 90 tablet, Rfl: 1   glipiZIDE (GLUCOTROL XL) 5 MG 24 hr tablet, Take 1 tablet (5 mg total) by mouth daily with breakfast., Disp: 90 tablet, Rfl: 1   rosuvastatin (CRESTOR) 40 MG tablet, Take 1 tablet (40 mg total) by mouth daily., Disp: 90 tablet, Rfl: 1   Vitamin D, Ergocalciferol, (DRISDOL) 1.25 MG  (50000 UNIT) CAPS capsule, Take 1 capsule (50,000 Units total) by mouth once a week., Disp: 12 capsule, Rfl: 1  No Known Allergies  I personally reviewed active problem list, medication list, allergies, family history, social history, health maintenance with the patient/caregiver today.   ROS  Ten systems reviewed and is negative except as mentioned in HPI   Objective  Vitals:   07/06/21 1501  BP: 136/72  Pulse: 74  Resp: 16  SpO2: 97%  Weight: 212 lb (96.2 kg)  Height: '5\' 7"'$  (1.702 m)    Body mass index is 33.2 kg/m.  Physical Exam  Constitutional: Patient appears well-developed and well-nourished. Obese  No distress.  HEENT: head atraumatic, normocephalic, pupils equal and reactive to light, neck supple Cardiovascular: Normal rate, regular rhythm and normal heart sounds.  No murmur heard. No BLE edema. Pulmonary/Chest: Effort normal and breath sounds normal. No respiratory distress. Abdominal: Soft.  There is no tenderness. Psychiatric: Patient has a normal mood and affect. behavior is normal. Judgment and thought content normal.   Recent Results (from the past 2160 hour(s))  Glucose, capillary     Status: None   Collection Time: 05/04/21  7:42 AM  Result Value Ref Range   Glucose-Capillary 92 70 - 99 mg/dL    Comment: Glucose reference range applies only to samples taken after fasting for at least 8 hours.   Comment 1 Document in Chart   Surgical pathology     Status: None   Collection Time: 05/04/21  8:17 AM  Result Value Ref Range   SURGICAL PATHOLOGY      SURGICAL PATHOLOGY CASE: [id number] PATIENT: Chancie Husby Surgical Pathology Report     Specimen Submitted: A. Colon polyp x2, ascending; cold snare B. Colon polyp, transverse; cold snare C. Colon polyp, sigmoid; cold snare  Clinical History: History of colonic polyps. Finding: Colon polyps     DIAGNOSIS: A.  COLON POLYP X 2, ASCENDING; COLD SNARE: - TUBULAR ADENOMAS, MULTIPLE  FRAGMENTS. - NEGATIVE FOR HIGH-GRADE DYSPLASIA AND MALIGNANCY.  B.  COLON POLYP, TRANSVERSE; COLD SNARE: - TUBULAR ADENOMA, MULTIPLE FRAGMENTS. - NEGATIVE FOR HIGH-GRADE DYSPLASIA AND MALIGNANCY.  C.  COLON POLYP, SIGMOID; COLD SNARE: - HYPERPLASTIC POLYP. - NEGATIVE FOR DYSPLASIA AND MALIGNANCY.  GROSS DESCRIPTION: A. Labeled: Cold snare ascending colon polyp x2 Received: Formalin Collection time: 8:17 AM on 05/04/2021 Placed into formalin time: 8:17 AM on 05/04/2021 Tissue fragment(s): Multiple Size: Aggregate, 1.5 x 0.5 x 0.1 cm Description: Tan soft tissue fragments E ntirely submitted in 1 cassette.  B. Labeled: Cold snare transverse colon polyp Received: Formalin Collection time: 8:22 AM on 05/04/2021 Placed into formalin time: 8:22 AM on 05/04/2021 Tissue fragment(s): Multiple Size: Aggregate, 1.3 x 0.5 x 0.1 cm Description: Tan soft tissue fragments Entirely submitted in 1 cassette.  C. Labeled: Cold snare sigmoid colon polyp Received: Formalin Collection time: 8:27 AM on 05/04/2021 Placed into formalin time: 8:27 AM on 05/04/2021 Tissue fragment(s): 1 Size: 0.3 x 0.2 x 0.1 cm Description: Tan soft tissue fragment Entirely submitted in 1 cassette.  CM 05/04/2021  Final Diagnosis performed by Bryan Lemma, MD.   Electronically signed 05/06/2021 3:49:05PM The electronic signature indicates that the named Attending Pathologist has evaluated the specimen Technical component performed at [facility], [address] [place], [place] [address] Lab: [phone] Dir: Rush Farmer, MD, MMM  Professional component performed at [facility], [facility], [place], [place], [place] [address] Lab: [phone] Dir: Kathi Simpers, MD   POCT HgB A1C     Status: Abnormal   Collection Time: 07/06/21  3:10 PM  Result Value Ref Range   Hemoglobin A1C 6.4 (A) 4.0 - 5.6 %   HbA1c POC (<> result, manual entry)     HbA1c, POC (prediabetic range)     HbA1c, POC  (controlled diabetic range)       PHQ2/9:    07/06/2021    3:01 PM 04/29/2021    4:05 PM 03/08/2021    3:19 PM 12/01/2020    2:39 PM 11/26/2020    9:04 AM  Depression screen PHQ 2/9  Decreased Interest  0 0 0 0 0  Down, Depressed, Hopeless 0 0 0 0 0  PHQ - 2 Score 0 0 0 0 0  Altered sleeping 0 0 0 0   Tired, decreased energy 0 0 0 0   Change in appetite 0 0 0 0   Feeling bad or failure about yourself  0 0 0 0   Trouble concentrating 0 0 0 0   Moving slowly or fidgety/restless 0 0 0 0   Suicidal thoughts 0 0 0 0   PHQ-9 Score 0 0 0 0   Difficult doing work/chores  Not difficult at all  Not difficult at all     phq 9 is negative   Fall Risk:    07/06/2021    3:01 PM 04/29/2021    4:05 PM 03/08/2021    3:19 PM 12/01/2020    2:38 PM 11/26/2020    9:04 AM  Fall Risk   Falls in the past year? 0 0 0 0 0  Number falls in past yr: 0 0 0 0 0  Injury with Fall? 0 0 0 0 0  Risk for fall due to : No Fall Risks  No Fall Risks No Fall Risks No Fall Risks  Follow up Falls prevention discussed  Falls prevention discussed Falls prevention discussed Falls prevention discussed      Functional Status Survey: Is the patient deaf or have difficulty hearing?: No Does the patient have difficulty seeing, even when wearing glasses/contacts?: No Does the patient have difficulty concentrating, remembering, or making decisions?: No Does the patient have difficulty walking or climbing stairs?: No Does the patient have difficulty dressing or bathing?: No Does the patient have difficulty doing errands alone such as visiting a doctor's office or shopping?: No    Assessment & Plan  1. Hypertension associated with type 2 diabetes mellitus (HCC)  - POCT HgB A1C - amLODipine-olmesartan (AZOR) 10-40 MG tablet; Take 1 tablet by mouth daily.  Dispense: 90 tablet; Refill: 1 - carvedilol (COREG) 12.5 MG tablet; Take 1 tablet (12.5 mg total) by mouth 2 (two) times daily with a meal.  Dispense: 180  tablet; Refill: 1 - dapagliflozin propanediol (FARXIGA) 5 MG TABS tablet; Take 1 tablet (5 mg total) by mouth daily before breakfast.  Dispense: 90 tablet; Refill: 1  2. Controlled type 2 diabetes mellitus with microalbuminuria, without long-term current use of insulin (HCC)  - glipiZIDE (GLUCOTROL XL) 5 MG 24 hr tablet; Take 1 tablet (5 mg total) by mouth daily with breakfast.  Dispense: 90 tablet; Refill: 1  3. Thoracic aortic atherosclerosis (HCC)  - rosuvastatin (CRESTOR) 40 MG tablet; Take 1 tablet (40 mg total) by mouth daily.  Dispense: 90 tablet; Refill: 1  4. Controlled gout  - febuxostat (ULORIC) 40 MG tablet; TAKE 1 TABLET BY MOUTH ONCE DAILY FOR  GOUT  (TAKE  IN  THE  PLACE  OF  ALLOPURINOL)  Dispense: 90 tablet; Refill: 1  5. Dyslipidemia  - rosuvastatin (CRESTOR) 40 MG tablet; Take 1 tablet (40 mg total) by mouth daily.  Dispense: 90 tablet; Refill: 1  6. Vitamin D deficiency  - Vitamin D, Ergocalciferol, (DRISDOL) 1.25 MG (50000 UNIT) CAPS capsule; Take 1 capsule (50,000 Units total) by mouth once a week.  Dispense: 12 capsule; Refill: 1  7. Mild persistent reactive airway disease without complication  - albuterol (VENTOLIN HFA) 108 (90 Base) MCG/ACT inhaler; Inhale 2 puffs into the lungs every 6 (six) hours as needed for wheezing or shortness of breath.  Dispense: 18 g; Refill: 0  8. Focal segmental glomerulosclerosis   9. End stage kidney disease ([place])  Going to [facility] and getting evaluation for kidney trasnplant  10. B12 deficiency

## 2021-07-06 ENCOUNTER — Encounter: Payer: Self-pay | Admitting: Family Medicine

## 2021-07-06 ENCOUNTER — Ambulatory Visit: Payer: Commercial Managed Care - PPO | Admitting: Family Medicine

## 2021-07-06 VITALS — BP 136/72 | HR 74 | Resp 16 | Ht 67.0 in | Wt 212.0 lb

## 2021-07-06 DIAGNOSIS — E1129 Type 2 diabetes mellitus with other diabetic kidney complication: Secondary | ICD-10-CM

## 2021-07-06 DIAGNOSIS — I152 Hypertension secondary to endocrine disorders: Secondary | ICD-10-CM | POA: Diagnosis not present

## 2021-07-06 DIAGNOSIS — E785 Hyperlipidemia, unspecified: Secondary | ICD-10-CM

## 2021-07-06 DIAGNOSIS — R809 Proteinuria, unspecified: Secondary | ICD-10-CM

## 2021-07-06 DIAGNOSIS — I700 Atherosclerosis of aorta: Secondary | ICD-10-CM | POA: Diagnosis not present

## 2021-07-06 DIAGNOSIS — J4530 Mild persistent asthma, uncomplicated: Secondary | ICD-10-CM

## 2021-07-06 DIAGNOSIS — N186 End stage renal disease: Secondary | ICD-10-CM

## 2021-07-06 DIAGNOSIS — M109 Gout, unspecified: Secondary | ICD-10-CM

## 2021-07-06 DIAGNOSIS — E538 Deficiency of other specified B group vitamins: Secondary | ICD-10-CM

## 2021-07-06 DIAGNOSIS — N051 Unspecified nephritic syndrome with focal and segmental glomerular lesions: Secondary | ICD-10-CM

## 2021-07-06 DIAGNOSIS — E1159 Type 2 diabetes mellitus with other circulatory complications: Secondary | ICD-10-CM | POA: Diagnosis not present

## 2021-07-06 DIAGNOSIS — E559 Vitamin D deficiency, unspecified: Secondary | ICD-10-CM

## 2021-07-06 LAB — POCT GLYCOSYLATED HEMOGLOBIN (HGB A1C): Hemoglobin A1C: 6.4 % — AB (ref 4.0–5.6)

## 2021-07-06 MED ORDER — GLIPIZIDE ER 5 MG PO TB24: 5.0000 mg | ORAL_TABLET | Freq: Every day | ORAL | 1 refills | Status: DC

## 2021-07-06 MED ORDER — DAPAGLIFLOZIN PROPANEDIOL 5 MG PO TABS: 5.0000 mg | ORAL_TABLET | Freq: Every day | ORAL | 1 refills | Status: DC

## 2021-07-06 MED ORDER — VITAMIN D (ERGOCALCIFEROL) 1.25 MG (50000 UNIT) PO CAPS: 50000.0000 [IU] | ORAL_CAPSULE | ORAL | 1 refills | Status: DC

## 2021-07-06 MED ORDER — FEBUXOSTAT 40 MG PO TABS: ORAL_TABLET | ORAL | 1 refills | Status: DC

## 2021-07-06 MED ORDER — ROSUVASTATIN CALCIUM 40 MG PO TABS: 40.0000 mg | ORAL_TABLET | Freq: Every day | ORAL | 1 refills | Status: DC

## 2021-07-06 MED ORDER — CARVEDILOL 12.5 MG PO TABS: 12.5000 mg | ORAL_TABLET | Freq: Two times a day (BID) | ORAL | 1 refills | Status: DC

## 2021-07-06 MED ORDER — ALBUTEROL SULFATE HFA 108 (90 BASE) MCG/ACT IN AERS: 2.0000 | INHALATION_SPRAY | Freq: Four times a day (QID) | RESPIRATORY_TRACT | 0 refills | Status: DC | PRN

## 2021-07-06 MED ORDER — AMLODIPINE-OLMESARTAN 10-40 MG PO TABS: 1.0000 | ORAL_TABLET | Freq: Every day | ORAL | 1 refills | Status: DC

## 2021-11-04 NOTE — Progress Notes (Signed)
Name: Cheryl Barr   MRN: [id number]    DOB: [birth year]   Date:11/05/2021       Progress Note  Subjective  Chief Complaint  Follow Up  HPI  HTN: compliant with medication, Azor, carvedilol Dr. Candiss Norse also added Hydralazine Spring  2023. BP is at goal today .  She denies chest pain, palpitationor dizziness, she has SOB but stable.     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 Candiss Norse,  kidney biopsy showed glomerulonephritis She is on  aspirin,  she has symptoms of meralgia paresthetica on right side for a long time, she was on Lyrica and stopped taking it again because not efficatious. She denies polyphagia, but has noticed polydipsia and polyuria.  She is currently on Farxiga low dose for kidney and glipizide also low dose and no hypoglycemia, A1C is down from 6.4 % to 5.8 %    Gout: . she is on Uloric now, and under control , last uric acid was at goal 4 back in April 2023 at Dr. Keturah Barre office   RAD: seen by Dr. 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 She has chronic dry cough, she was given trelegy but stopped due to cost and because symptoms had improved.. Stable SOB with activity  Denies cough or wheezing at this time She uses albuterol as needed only    Vitamin B12 deficiency: she is not  taking otc supplementation.  Vitamin D deficiency: taking vitamin D rx weekly    Chronic kidney disease: she is seeing Dr. Candiss Norse, now is CKI stage V  has secondary hyperparathyroidism , renal biopsy showed segmental glomerulonephritis . No pruritis, normal urine output  Dr. Candiss Norse prescribed her  low dose Wilder Glade for kidney protection , she is now going to [facility] and is on the transplant list   Left ovarian cyst: incidental finding during evaluation  for kidney transplant, patient is worried, we will refer her to gyn  Right hand numbness: he wakes up with numbness right hand when sleeping on right arm, shakes her hand and improves, discussed possible carpal tunnel   Cloudy urine: going on for months, she is worried about UTI but denies urgency , frequency or hematuria   Patient Active Problem List   Diagnosis Date Noted   History of colonic polyps    Polyp of transverse colon    End stage kidney disease ([place]) 03/08/2021   Reactive airway disease 05/20/2020   Hypertension associated with type 2 diabetes mellitus ([place]) 05/20/2020   Hyperparathyroidism due to renal insufficiency ([place]) 06/12/2019   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   Anemia in chronic kidney disease 06/12/2019   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   Incomplete rotator cuff tear or rupture of left shoulder, not specified as traumatic 07/26/2016   Thoracic aortic atherosclerosis ([place]) 01/12/2016   DDD (degenerative disc disease), 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 Bary Castilla   BREAST SURGERY Left 2015   COLONOSCOPY  2009   1 polyp found. Dr. Roselyn Reef   COLONOSCOPY WITH PROPOFOL N/A 05/04/2021   Procedure: COLONOSCOPY WITH PROPOFOL;  Surgeon: Lucilla Lame, MD;  Location: [facility] ENDOSCOPY;  Service: Endoscopy;  Laterality: N/A;   HERNIA REPAIR  74/01/8785   Ventral/umbilical hernia repair with 10 x 10 cm Atrium mesh in preperitoneal position.   INSERTION OF MESH N/A 10/09/2014   Procedure: INSERTION  OF MESH;  Surgeon: Robert Bellow, MD;  Location: [facility] ORS;  Service: General;  Laterality: N/A;   KNEE SURGERY Left 01/14/2009   STRABISMUS SURGERY     Repair as a child   TUBAL LIGATION  1990   VENTRAL HERNIA REPAIR N/A 10/09/2014   Procedure: HERNIA REPAIR VENTRAL ADULT;  Surgeon: Robert Bellow, MD;  Location: [facility] ORS;  Service: General;  Laterality: N/A;    Family History  Problem Relation Age of Onset   Hypertension Mother    Diabetes Mother    Heart attack Father    Coronary artery disease Father    Breast cancer Father 65   Diabetes Sister    Gout Brother    Heart disease Brother    Allergies Son        Haematologist and Geographical information systems officer    Social History   Tobacco Use   Smoking status: Former    Types: Cigarettes    Quit date: 10/02/1983    Years since quitting: 38.1   Smokeless tobacco: Never  Substance Use Topics   Alcohol use: No    Alcohol/week: 0.0 standard drinks of alcohol     Current Outpatient Medications:    amLODipine-olmesartan (AZOR) 10-40 MG tablet, Take 1 tablet by mouth daily., Disp: 90 tablet, Rfl: 1   aspirin 81 MG EC tablet, Take 81 mg by mouth daily., Disp: , Rfl:    carvedilol (COREG) 12.5 MG tablet, Take 1 tablet (12.5 mg total) by mouth 2 (two) times daily with a meal., Disp: 180 tablet, Rfl: 1   dapagliflozin propanediol (FARXIGA) 5 MG TABS tablet, Take 1 tablet (5 mg total) by mouth daily before breakfast., Disp: 90 tablet, Rfl: 1   febuxostat (ULORIC) 40 MG tablet, TAKE 1 TABLET BY MOUTH ONCE DAILY FOR  GOUT  (TAKE  IN  THE  PLACE  OF  ALLOPURINOL), Disp: 90 tablet, Rfl: 1   fluticasone (FLONASE) 50 MCG/ACT nasal spray, Place 2 sprays into both nostrils daily., Disp: 16 g, Rfl: 6   glipiZIDE (GLUCOTROL XL) 5 MG 24 hr tablet, Take 1 tablet (5 mg total) by mouth daily with breakfast., Disp: 90 tablet, Rfl: 1   hydrALAZINE (APRESOLINE) 25 MG tablet, Take by mouth 3 (three) times daily., Disp: , Rfl:    rosuvastatin (CRESTOR) 40 MG tablet, Take 1  tablet (40 mg total) by mouth daily., Disp: 90 tablet, Rfl: 1   Vitamin D, Ergocalciferol, (DRISDOL) 1.25 MG (50000 UNIT) CAPS capsule, Take 1 capsule (50,000 Units total) by mouth once a week., Disp: 12 capsule, Rfl: 1   albuterol (VENTOLIN HFA) 108 (90 Base) MCG/ACT inhaler, Inhale 2 puffs into the lungs every 6 (six) hours as needed for wheezing or shortness of breath. (Patient not taking: Reported on 11/05/2021), Disp: 18 g, Rfl: 0  No Known Allergies  I personally reviewed active problem list, medication list, allergies, family history, social history,  health maintenance with the patient/caregiver today.   ROS  Constitutional: Negative for fever or weight change.  Respiratory: Negative for cough, she has some  shortness of breath.   Cardiovascular: Negative for chest pain or palpitations.  Gastrointestinal: Negative for abdominal pain, no bowel changes.  Musculoskeletal: Negative for gait problem or joint swelling.  Skin: Negative for rash.  Neurological: Negative for dizziness or headache.  No other specific complaints in a complete review of systems (except as listed in HPI above).   Objective  Vitals:   11/05/21 1326  BP: 134/76  Pulse: 78  Resp: 16  SpO2: 98%  Weight: 211 lb (95.7 kg)  Height: '5\' 7"'$  (1.702 m)    Body mass index is 33.05 kg/m.  Physical Exam  Constitutional: Patient appears well-developed and well-nourished. Obese  No distress.  HEENT: head atraumatic, normocephalic, pupils equal and reactive to light, neck supple Cardiovascular: Normal rate, regular rhythm and normal heart sounds.  No murmur heard. No BLE edema. Pulmonary/Chest: Effort normal and breath sounds normal. No respiratory distress. Abdominal: Soft.  There is no tenderness. Psychiatric: Patient has a normal mood and affect. behavior is normal. Judgment and thought content normal.   Recent Results (from the past 2160 hour(s))  POCT HgB A1C     Status: Abnormal   Collection Time: 11/05/21   1:28 PM  Result Value Ref Range   Hemoglobin A1C 5.8 (A) 4.0 - 5.6 %   HbA1c POC (<> result, manual entry)     HbA1c, POC (prediabetic range)     HbA1c, POC (controlled diabetic range)       PHQ2/9:    11/05/2021    1:27 PM 07/06/2021    3:01 PM 04/29/2021    4:05 PM 03/08/2021    3:19 PM 12/01/2020    2:39 PM  Depression screen PHQ 2/9  Decreased Interest 0 0 0 0 0  Down, Depressed, Hopeless 0 0 0 0 0  PHQ - 2 Score 0 0 0 0 0  Altered sleeping 0 0 0 0 0  Tired, decreased energy 0 0 0 0 0  Change in appetite 0 0 0 0 0  Feeling bad or failure about yourself  0 0 0 0 0  Trouble concentrating 0 0 0 0 0  Moving slowly or fidgety/restless 0 0 0 0 0  Suicidal thoughts 0 0 0 0 0  PHQ-9 Score 0 0 0 0 0  Difficult doing work/chores   Not difficult at all  Not difficult at all    phq 9 is negative   Fall Risk:    11/05/2021    1:27 PM 07/06/2021    3:01 PM 04/29/2021    4:05 PM 03/08/2021    3:19 PM 12/01/2020    2:38 PM  [place] in the past year? 0 0 0 0 0  Number falls in past yr: 0 0 0 0 0  Injury with Fall? 0 0 0 0 0  Risk for fall due to : No Fall Risks No Fall Risks  No Fall Risks No Fall Risks  Follow up Falls prevention discussed Falls prevention discussed  Falls prevention discussed Falls prevention discussed      Functional Status Survey: Is the patient deaf or have difficulty hearing?: No Does the patient have difficulty seeing, even when wearing glasses/contacts?: Yes Does the patient have difficulty concentrating, remembering, or making decisions?: No Does the patient have difficulty walking or climbing stairs?: No Does the patient have difficulty  dressing or bathing?: No Does the patient have difficulty doing errands alone such as visiting a doctor's office or shopping?: No    Assessment & Plan  1. Hypertension associated with type 2 diabetes mellitus (HCC)  - POCT HgB A1C - amLODipine-olmesartan (AZOR) 10-40 MG tablet; Take 1 tablet by mouth  daily.  Dispense: 90 tablet; Refill: 1 - carvedilol (COREG) 12.5 MG tablet; Take 1 tablet (12.5 mg total) by mouth 2 (two) times daily with a meal.  Dispense: 180 tablet; Refill: 1 - dapagliflozin propanediol (FARXIGA) 5 MG TABS tablet; Take 1 tablet (5 mg total) by mouth daily before breakfast.  Dispense: 90 tablet; Refill: 1  2. Controlled gout  - febuxostat (ULORIC) 40 MG tablet; TAKE 1 TABLET BY MOUTH ONCE DAILY FOR  GOUT  (TAKE  IN  THE  PLACE  OF  ALLOPURINOL)  Dispense: 90 tablet; Refill: 1  3. Dyslipidemia  - rosuvastatin (CRESTOR) 40 MG tablet; Take 1 tablet (40 mg total) by mouth daily.  Dispense: 90 tablet; Refill: 1  4.Thoracic aortic atherosclerosis (HCC)  - rosuvastatin (CRESTOR) 40 MG tablet; Take 1 tablet (40 mg total) by mouth daily.  Dispense: 90 tablet; Refill: 1  5. Controlled type 2 diabetes mellitus with microalbuminuria, without long-term current use of insulin (HCC)  - glipiZIDE (GLUCOTROL XL) 2.5 MG 24 hr tablet; Take 1 tablet (2.5 mg total) by mouth daily with breakfast.  Dispense: 90 tablet; Refill: 1  6. Vitamin D deficiency  - Vitamin D, Ergocalciferol, (DRISDOL) 1.25 MG (50000 UNIT) CAPS capsule; Take 1 capsule (50,000 Units total) by mouth once a week.  Dispense: 12 capsule; Refill: 1  7. Ovarian cyst, left  - Ambulatory referral to Obstetrics / Gynecology  8. Cloudy urine  - CULTURE, URINE COMPREHENSIVE  9. Meralgia paresthetica of right side   10. Numbness of right hand

## 2021-11-05 ENCOUNTER — Encounter: Payer: Self-pay | Admitting: Family Medicine

## 2021-11-05 ENCOUNTER — Ambulatory Visit: Payer: Commercial Managed Care - PPO | Admitting: Family Medicine

## 2021-11-05 VITALS — BP 134/76 | HR 78 | Resp 16 | Ht 67.0 in | Wt 211.0 lb

## 2021-11-05 DIAGNOSIS — E785 Hyperlipidemia, unspecified: Secondary | ICD-10-CM | POA: Diagnosis not present

## 2021-11-05 DIAGNOSIS — E559 Vitamin D deficiency, unspecified: Secondary | ICD-10-CM

## 2021-11-05 DIAGNOSIS — I152 Hypertension secondary to endocrine disorders: Secondary | ICD-10-CM | POA: Diagnosis not present

## 2021-11-05 DIAGNOSIS — M109 Gout, unspecified: Secondary | ICD-10-CM | POA: Diagnosis not present

## 2021-11-05 DIAGNOSIS — R809 Proteinuria, unspecified: Secondary | ICD-10-CM

## 2021-11-05 DIAGNOSIS — N83202 Unspecified ovarian cyst, left side: Secondary | ICD-10-CM

## 2021-11-05 DIAGNOSIS — I700 Atherosclerosis of aorta: Secondary | ICD-10-CM | POA: Diagnosis not present

## 2021-11-05 DIAGNOSIS — G5711 Meralgia paresthetica, right lower limb: Secondary | ICD-10-CM

## 2021-11-05 DIAGNOSIS — E1159 Type 2 diabetes mellitus with other circulatory complications: Secondary | ICD-10-CM | POA: Diagnosis not present

## 2021-11-05 DIAGNOSIS — R8290 Unspecified abnormal findings in urine: Secondary | ICD-10-CM

## 2021-11-05 DIAGNOSIS — R200 Anesthesia of skin: Secondary | ICD-10-CM

## 2021-11-05 DIAGNOSIS — E1129 Type 2 diabetes mellitus with other diabetic kidney complication: Secondary | ICD-10-CM

## 2021-11-05 LAB — POCT GLYCOSYLATED HEMOGLOBIN (HGB A1C): Hemoglobin A1C: 5.8 % — AB (ref 4.0–5.6)

## 2021-11-05 MED ORDER — DAPAGLIFLOZIN PROPANEDIOL 5 MG PO TABS: 5.0000 mg | ORAL_TABLET | Freq: Every day | ORAL | 1 refills | Status: DC

## 2021-11-05 MED ORDER — CARVEDILOL 12.5 MG PO TABS: 12.5000 mg | ORAL_TABLET | Freq: Two times a day (BID) | ORAL | 1 refills | Status: DC

## 2021-11-05 MED ORDER — VITAMIN D (ERGOCALCIFEROL) 1.25 MG (50000 UNIT) PO CAPS: 50000.0000 [IU] | ORAL_CAPSULE | ORAL | 1 refills | Status: DC

## 2021-11-05 MED ORDER — AMLODIPINE-OLMESARTAN 10-40 MG PO TABS: 1.0000 | ORAL_TABLET | Freq: Every day | ORAL | 1 refills | Status: DC

## 2021-11-05 MED ORDER — ROSUVASTATIN CALCIUM 40 MG PO TABS: 40.0000 mg | ORAL_TABLET | Freq: Every day | ORAL | 1 refills | Status: DC

## 2021-11-05 MED ORDER — FEBUXOSTAT 40 MG PO TABS: ORAL_TABLET | ORAL | 1 refills | Status: DC

## 2021-11-05 MED ORDER — GLIPIZIDE ER 2.5 MG PO TB24: 2.5000 mg | ORAL_TABLET | Freq: Every day | ORAL | 1 refills | Status: DC

## 2021-11-08 ENCOUNTER — Other Ambulatory Visit: Payer: Self-pay | Admitting: Family Medicine

## 2021-11-08 DIAGNOSIS — N3000 Acute cystitis without hematuria: Secondary | ICD-10-CM

## 2021-11-08 LAB — CULTURE, URINE COMPREHENSIVE
MICRO NUMBER:: 13960682
SPECIMEN QUALITY:: ADEQUATE

## 2021-11-08 MED ORDER — AMOXICILLIN-POT CLAVULANATE 875-125 MG PO TABS: 1.0000 | ORAL_TABLET | Freq: Two times a day (BID) | ORAL | 0 refills | Status: DC

## 2021-11-26 NOTE — Patient Instructions (Signed)
Preventive Care 65 Years and Older, Female Preventive care refers to lifestyle choices and visits with your health care provider that can promote health and wellness. Preventive care visits are also called wellness exams. What can I expect for my preventive care visit? Counseling Your health care provider may ask you questions about your: Medical history, including: Past medical problems. Family medical history. Pregnancy and menstrual history. History of falls. Current health, including: Memory and ability to understand (cognition). Emotional well-being. Home life and relationship well-being. Sexual activity and sexual health. Lifestyle, including: Alcohol, nicotine or tobacco, and drug use. Access to firearms. Diet, exercise, and sleep habits. Work and work environment. Sunscreen use. Safety issues such as seatbelt and bike helmet use. Physical exam Your health care provider will check your: Height and weight. These may be used to calculate your BMI (body mass index). BMI is a measurement that tells if you are at a healthy weight. Waist circumference. This measures the distance around your waistline. This measurement also tells if you are at a healthy weight and may help predict your risk of certain diseases, such as type 2 diabetes and high blood pressure. Heart rate and blood pressure. Body temperature. Skin for abnormal spots. What immunizations do I need?  Vaccines are usually given at various ages, according to a schedule. Your health care provider will recommend vaccines for you based on your age, medical history, and lifestyle or other factors, such as travel or where you work. What tests do I need? Screening Your health care provider may recommend screening tests for certain conditions. This may include: Lipid and cholesterol levels. Hepatitis C test. Hepatitis B test. HIV (human immunodeficiency virus) test. STI (sexually transmitted infection) testing, if you are at  risk. Lung cancer screening. Colorectal cancer screening. Diabetes screening. This is done by checking your blood sugar (glucose) after you have not eaten for a while (fasting). Mammogram. Talk with your health care provider about how often you should have regular mammograms. BRCA-related cancer screening. This may be done if you have a family history of breast, ovarian, tubal, or peritoneal cancers. Bone density scan. This is done to screen for osteoporosis. Talk with your health care provider about your test results, treatment options, and if necessary, the need for more tests. Follow these instructions at home: Eating and drinking  Eat a diet that includes fresh fruits and vegetables, whole grains, lean protein, and low-fat dairy products. Limit your intake of foods with high amounts of sugar, saturated fats, and salt. Take vitamin and mineral supplements as recommended by your health care provider. Do not drink alcohol if your health care provider tells you not to drink. If you drink alcohol: Limit how much you have to 0-1 drink a day. Know how much alcohol is in your drink. In the [place] one drink equals one 12 oz bottle of beer (355 mL), one 5 oz glass of wine (148 mL), or one 1 oz glass of hard liquor (44 mL). Lifestyle Brush your teeth every morning and night with fluoride toothpaste. Floss one time each day. Exercise for at least 30 minutes 5 or more days each week. Do not use any products that contain nicotine or tobacco. These products include cigarettes, chewing tobacco, and vaping devices, such as e-cigarettes. If you need help quitting, ask your health care provider. Do not use drugs. If you are sexually active, practice safe sex. Use a condom or other form of protection in order to prevent STIs. Take aspirin only as told by   your health care provider. Make sure that you understand how much to take and what form to take. Work with your health care provider to find out whether it  is safe and beneficial for you to take aspirin daily. Ask your health care provider if you need to take a cholesterol-lowering medicine (statin). Find healthy ways to manage stress, such as: Meditation, yoga, or listening to music. Journaling. Talking to a trusted person. Spending time with friends and family. Minimize exposure to UV radiation to reduce your risk of skin cancer. Safety Always wear your seat belt while driving or riding in a vehicle. Do not drive: If you have been drinking alcohol. Do not ride with someone who has been drinking. When you are tired or distracted. While texting. If you have been using any mind-altering substances or drugs. Wear a helmet and other protective equipment during sports activities. If you have firearms in your house, make sure you follow all gun safety procedures. What's next? Visit your health care provider once a year for an annual wellness visit. Ask your health care provider how often you should have your eyes and teeth checked. Stay up to date on all vaccines. This information is not intended to replace advice given to you by your health care provider. Make sure you discuss any questions you have with your health care provider. Document Revised: 07/29/2020 Document Reviewed: 07/29/2020 Elsevier Patient Education  [place].

## 2021-11-26 NOTE — Progress Notes (Unsigned)
Name: Cheryl Barr   MRN: [id number]    DOB: 18-Nov-1955   Date:11/29/2021       Progress Note  Subjective  Chief Complaint  Annual Exam  HPI  Patient presents for annual CPE.  Diet: trying to follow a diabetic diet Exercise: she walks a lot at work  Last Eye Exam: due for an eye exam Last Dental Exam: over one year, reminded her importance of follow up   [employer] Visit from 02/20/2020 in [facility]  AUDIT-C Score 0      Depression: Phq 9 is  negative    11/29/2021    8:00 AM 11/05/2021    1:27 PM 07/06/2021    3:01 PM 04/29/2021    4:05 PM 03/08/2021    3:19 PM  Depression screen PHQ 2/9  Decreased Interest 0 0 0 0 0  Down, Depressed, Hopeless 0 0 0 0 0  PHQ - 2 Score 0 0 0 0 0  Altered sleeping 0 0 0 0 0  Tired, decreased energy 0 0 0 0 0  Change in appetite 0 0 0 0 0  Feeling bad or failure about yourself  0 0 0 0 0  Trouble concentrating 0 0 0 0 0  Moving slowly or fidgety/restless 0 0 0 0 0  Suicidal thoughts 0 0 0 0 0  PHQ-9 Score 0 0 0 0 0  Difficult doing work/chores    Not difficult at all    Hypertension: BP Readings from Last 3 Encounters:  11/29/21 132/70  11/05/21 134/76  07/06/21 136/72   Obesity: Wt Readings from Last 3 Encounters:  11/29/21 208 lb (94.3 kg)  11/05/21 211 lb (95.7 kg)  07/06/21 212 lb (96.2 kg)   BMI Readings from Last 3 Encounters:  11/29/21 32.58 kg/m  11/05/21 33.05 kg/m  07/06/21 33.20 kg/m     Vaccines:   Tdap: up to date Shingrix: up to date Pneumonia: up to date Flu: 2021, due - she will have it at work  COVID-19: discussed booster    Hep C Screening: 12/01/17 STD testing and prevention (HIV/chl/gon/syphilis): N/A Intimate partner violence: negative screen  Sexual History : yes, uses condoms every time Menstrual History/LMP/Abnormal Bleeding: s/p menopausal, she has ovarian cyst and needs to go next door to get her appointment  Discussed importance of follow up if any  post-menopausal bleeding: yes  Incontinence Symptoms: negative for symptoms   Breast cancer:  - Last Mammogram: 04/16/21 - BRCA gene screening: father had breast cancer - unknown age- she does not want genetic testing   Osteoporosis Prevention : Discussed high calcium and vitamin D supplementation, weight bearing exercises Bone density:  03/17/10  Cervical cancer screening: N/A  Skin cancer: Discussed monitoring for atypical lesions  Colorectal cancer: 05/04/21   Lung cancer:  Low Dose CT Chest recommended if Age 54-80 years, 20 pack-year currently smoking OR have quit w/in 15years. Patient does not qualify for screen   ECG: 03/27/19  Advanced Care Planning: A voluntary discussion about advance care planning including the explanation and discussion of advance directives.  Discussed health care proxy and Living will, and the patient was able to identify a health care proxy as daughter  .  Patient does not have a living will and power of attorney of health care   Lipids: Lab Results  Component Value Date   CHOL 134 09/01/2020   CHOL 194 11/21/2019   CHOL 144 11/15/2018   Lab Results  Component Value Date  HDL 41 (L) 09/01/2020   HDL 48 (L) 11/21/2019   HDL 44 (L) 11/15/2018   Lab Results  Component Value Date   LDLCALC 65 09/01/2020   LDLCALC 116 (H) 11/21/2019   LDLCALC 71 11/15/2018   Lab Results  Component Value Date   TRIG 213 (H) 09/01/2020   TRIG 188 (H) 11/21/2019   TRIG 195 (H) 11/15/2018   Lab Results  Component Value Date   CHOLHDL 3.3 09/01/2020   CHOLHDL 4.0 11/21/2019   CHOLHDL 3.3 11/15/2018   No results found for: "LDLDIRECT"  Glucose: Glucose  Date Value Ref Range Status  06/17/2013 137 (H) 65 - 99 mg/dL Final   Glucose, Bld  Date Value Ref Range Status  11/21/2019 106 (H) 65 - 99 mg/dL Final    Comment:    .            Fasting reference interval . For someone without known diabetes, a glucose value between 100 and 125 mg/dL is consistent  with prediabetes and should be confirmed with a follow-up test. .   03/27/2019 137 (H) 70 - 99 mg/dL Final  02/19/2019 128 (H) 65 - 99 mg/dL Final    Comment:    .            Fasting reference interval . For someone without known diabetes, a glucose value >125 mg/dL indicates that they may have diabetes and this should be confirmed with a follow-up test. .    Glucose-Capillary  Date Value Ref Range Status  05/04/2021 92 70 - 99 mg/dL Final    Comment:    Glucose reference range applies only to samples taken after fasting for at least 8 hours.  05/24/2019 127 (H) 70 - 99 mg/dL Final    Comment:    Glucose reference range applies only to samples taken after fasting for at least 8 hours.  10/09/2014 148 (H) 65 - 99 mg/dL Final    Patient Active Problem List   Diagnosis Date Noted   Meralgia paresthetica of right side 11/05/2021   Controlled type 2 diabetes mellitus with microalbuminuria, without long-term current use of insulin (Cheryl Barr) 11/05/2021   History of colonic polyps    Polyp of transverse colon    End stage kidney disease (Cheryl Barr) 03/08/2021   Reactive airway disease 05/20/2020   Hypertension associated with type 2 diabetes mellitus (Cheryl Barr) 05/20/2020   Hyperparathyroidism due to renal insufficiency (Cheryl Barr) 06/12/2019   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   Anemia in chronic kidney disease 06/12/2019   History of late syphilis 11/26/2018   Incomplete rotator cuff tear or rupture of left shoulder, not specified as traumatic 07/26/2016   Thoracic aortic atherosclerosis (Cheryl Barr) 01/12/2016   DDD (degenerative disc disease), 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/2016   B12 deficiency 01/11/2016   Hernia of abdominal cavity 09/15/2014   Dyslipidemia 08/10/2014   Interval gout 08/10/2014   Microalbuminuria 08/10/2014   Osteoarthritis of left knee 08/10/2014   Vitamin D deficiency 08/10/2014   Intraductal papilloma of breast 06/27/2013   Hypertension, essential, benign 09/02/2009    ABNORMAL EKG 09/02/2009    Past Surgical History:  Procedure Laterality Date   BREAST BIOPSY Left 05/24/2013   neg   BREAST EXCISIONAL BIOPSY Left 06/21/2013   neg/ dr. Bary Castilla   BREAST SURGERY Left 2015   COLONOSCOPY  2009   1 polyp found. Dr. Roselyn Reef   COLONOSCOPY WITH PROPOFOL N/A 05/04/2021   Procedure: COLONOSCOPY WITH PROPOFOL;  Surgeon: Lucilla Lame, MD;  Location: [facility] ENDOSCOPY;  Service: Endoscopy;  Laterality: N/A;   HERNIA REPAIR  76/28/3151   Ventral/umbilical hernia repair with 10 x 10 cm Atrium mesh in preperitoneal position.   INSERTION OF MESH N/A 10/09/2014   Procedure: INSERTION OF MESH;  Surgeon: Robert Bellow, MD;  Location: [facility] ORS;  Service: General;  Laterality: N/A;   KNEE SURGERY Left 01/14/2009   STRABISMUS SURGERY     Repair as a child   TUBAL LIGATION  1990   VENTRAL HERNIA REPAIR N/A 10/09/2014   Procedure: HERNIA REPAIR VENTRAL ADULT;  Surgeon: Robert Bellow, MD;  Location: [facility] ORS;  Service: General;  Laterality: N/A;    Family History  Problem Relation Age of Onset   Hypertension Mother    Diabetes Mother    Heart attack Father    Coronary artery disease Father    Breast cancer Father 68   Diabetes Sister    Gout Brother    Heart disease Brother    Allergies Son        Haematologist and Geographical information systems officer    Social History   Socioeconomic History   Marital status: Widowed    Spouse name: Not on file   Number of children: 3   Years of education: 12th Grade   Highest education level: Not on file  Occupational History   Occupation: kitchen prep     Employer: [employer]  Tobacco Use   Smoking status: Former    Types: Cigarettes    Quit date: 10/02/1983    Years since quitting: 38.1   Smokeless tobacco: Never  Vaping Use   Vaping Use: Never used  Substance and Sexual Activity   Alcohol use: No    Alcohol/week: 0.0 standard drinks of alcohol   Drug use: No   Sexual activity: Yes    Partners: Male    Birth control/protection: None     Comment: declined condoms   Other Topics Concern   Not on file  Social History Narrative   Widowed. Living with boyfriend    No regular exercise.   Two other brothers one adopted since 37 days old, died in May 04, 2020 with liver cancer, also has a foster brother    Social Determinants of Radio broadcast assistant Strain: Low Risk  (11/29/2021)   Overall Financial Resource Strain (CARDIA)    Difficulty of Paying Living Expenses: Not hard at all  Food Insecurity: No Food Insecurity (11/29/2021)   Hunger Vital Sign    Worried About Running Out of Food in the Last Year: Never true    Ran Out of Food in the Last Year: Never true  Transportation Needs: No Transportation Needs (11/26/2020)   PRAPARE - Hydrologist (Medical): No    Lack of Transportation (Non-Medical): No  Physical Activity: Inactive (11/29/2021)   Exercise Vital Sign    Days of Exercise per Week: 0 days    Minutes of Exercise per Session: 0 min  Stress: No Stress Concern Present (11/29/2021)   [place]    Feeling of Stress : Not at all  Social Connections: Moderately Integrated (11/29/2021)   Social Connection and Isolation Panel [NHANES]    Frequency of Communication with Friends and Family: More than three times a week    Frequency of Social Gatherings with Friends and Family: Three times a week    Attends Religious Services: More than 4 times per year    Active Member of Clubs or Organizations: Yes  Attends Archivist Meetings: More than 4 times per year    Marital Status: Widowed  Intimate Partner Violence: Not At Risk (11/29/2021)   Humiliation, Afraid, Rape, and Kick questionnaire    Fear of Current or Ex-Partner: No    Emotionally Abused: No    Physically Abused: No    Sexually Abused: No     Current Outpatient Medications:    albuterol (VENTOLIN HFA) 108 (90 Base) MCG/ACT inhaler, Inhale 2 puffs into  the lungs every 6 (six) hours as needed for wheezing or shortness of breath., Disp: 18 g, Rfl: 0   amLODipine-olmesartan (AZOR) 10-40 MG tablet, Take 1 tablet by mouth daily., Disp: 90 tablet, Rfl: 1   amoxicillin-clavulanate (AUGMENTIN) 875-125 MG tablet, Take 1 tablet by mouth 2 (two) times daily., Disp: 6 tablet, Rfl: 0   aspirin 81 MG EC tablet, Take 81 mg by mouth daily., Disp: , Rfl:    carvedilol (COREG) 12.5 MG tablet, Take 1 tablet (12.5 mg total) by mouth 2 (two) times daily with a meal., Disp: 180 tablet, Rfl: 1   dapagliflozin propanediol (FARXIGA) 5 MG TABS tablet, Take 1 tablet (5 mg total) by mouth daily before breakfast., Disp: 90 tablet, Rfl: 1   febuxostat (ULORIC) 40 MG tablet, TAKE 1 TABLET BY MOUTH ONCE DAILY FOR  GOUT  (TAKE  IN  THE  PLACE  OF  ALLOPURINOL), Disp: 90 tablet, Rfl: 1   glipiZIDE (GLUCOTROL XL) 2.5 MG 24 hr tablet, Take 1 tablet (2.5 mg total) by mouth daily with breakfast., Disp: 90 tablet, Rfl: 1   hydrALAZINE (APRESOLINE) 25 MG tablet, Take by mouth 3 (three) times daily., Disp: , Rfl:    rosuvastatin (CRESTOR) 40 MG tablet, Take 1 tablet (40 mg total) by mouth daily., Disp: 90 tablet, Rfl: 1   Vitamin D, Ergocalciferol, (DRISDOL) 1.25 MG (50000 UNIT) CAPS capsule, Take 1 capsule (50,000 Units total) by mouth once a week., Disp: 12 capsule, Rfl: 1   fluticasone (FLONASE) 50 MCG/ACT nasal spray, Place 2 sprays into both nostrils daily. (Patient not taking: Reported on 11/29/2021), Disp: 16 g, Rfl: 6  No Known Allergies   ROS  Constitutional: Negative for fever or weight change.  Respiratory: Negative for cough and shortness of breath.   Cardiovascular: Negative for chest pain or palpitations.  Gastrointestinal: Negative for abdominal pain, no bowel changes.  Musculoskeletal: Negative for gait problem or joint swelling.  Skin: Negative for rash.  Neurological: Negative for dizziness or headache.  No other specific complaints in a complete review of  systems (except as listed in HPI above).   Objective  Vitals:   11/29/21 0801  BP: 132/70  Pulse: 76  Resp: 16  SpO2: 98%  Weight: 208 lb (94.3 kg)  Height: '5\' 7"'  (1.702 m)    Body mass index is 32.58 kg/m.  Physical Exam  Constitutional: Patient appears well-developed and well-nourished. No distress.  HENT: Head: Normocephalic and atraumatic. Ears: B TMs ok, no erythema or effusion; Nose: Nose normal. Mouth/Throat: Oropharynx is clear and moist. No oropharyngeal exudate.  Eyes: Conjunctivae and EOM are normal. Pupils are equal, round, and reactive to light. No scleral icterus.  Neck: Normal range of motion. Neck supple. No JVD present. No thyromegaly present.  Cardiovascular: Normal rate, regular rhythm and normal heart sounds.  No murmur heard. No BLE edema. Pulmonary/Chest: Effort normal and breath sounds normal. No respiratory distress. Abdominal: Soft. Bowel sounds are normal, no distension. There is no tenderness. no masses Breast: no lumps  or masses, no nipple discharge or rashes FEMALE GENITALIA:  Not done  RECTAL: not done  Musculoskeletal: Normal range of motion, no joint effusions. No gross deformities Neurological: he is alert and oriented to person, place, and time. No cranial nerve deficit. Coordination, balance, strength, speech and gait are normal.  Skin: Skin is warm and dry. No rash noted. No erythema.  Psychiatric: Patient has a normal mood and affect. behavior is normal. Judgment and thought content normal.   Recent Results (from the past 2160 hour(s))  POCT HgB A1C     Status: Abnormal   Collection Time: 11/05/21  1:28 PM  Result Value Ref Range   Hemoglobin A1C 5.8 (A) 4.0 - 5.6 %   HbA1c POC (<> result, manual entry)     HbA1c, POC (prediabetic range)     HbA1c, POC (controlled diabetic range)    CULTURE, URINE COMPREHENSIVE     Status: Abnormal   Collection Time: 11/05/21  2:03 PM   Specimen: Urine  Result Value Ref Range   MICRO NUMBER: [id number]     SPECIMEN QUALITY: Adequate    Source OTHER (SPECIFY)    STATUS: FINAL    ISOLATE 1: Escherichia coli (A)     Comment: Greater than 100,000 CFU/mL of Escherichia coli      Susceptibility   Escherichia coli - CULT, URN, SPECIAL NEGATIVE 1    AMOX/CLAVULANIC <=2 Sensitive     AMPICILLIN <=2 Sensitive     AMPICILLIN/SULBACTAM <=2 Sensitive     CEFAZOLIN* <=4 Not Reportable      * For infections other than uncomplicated UTI caused by E. coli, K. pneumoniae or P. mirabilis: Cefazolin is resistant if MIC > or = 8 mcg/mL. (Distinguishing susceptible versus intermediate for isolates with MIC < or = 4 mcg/mL requires additional testing.) For uncomplicated UTI caused by E. coli, K. pneumoniae or P. mirabilis: Cefazolin is susceptible if MIC <32 mcg/mL and predicts susceptible to the oral agents cefaclor, cefdinir, cefpodoxime, cefprozil, cefuroxime, cephalexin and loracarbef.     CEFTAZIDIME <=1 Sensitive     CEFEPIME <=1 Sensitive     CEFTRIAXONE <=1 Sensitive     CIPROFLOXACIN <=0.25 Sensitive     LEVOFLOXACIN <=0.12 Sensitive     GENTAMICIN <=1 Sensitive     IMIPENEM <=0.25 Sensitive     NITROFURANTOIN <=16 Sensitive     PIP/TAZO <=4 Sensitive     TOBRAMYCIN <=1 Sensitive     TRIMETH/SULFA* <=20 Sensitive      * For infections other than uncomplicated UTI caused by E. coli, K. pneumoniae or P. mirabilis: Cefazolin is resistant if MIC > or = 8 mcg/mL. (Distinguishing susceptible versus intermediate for isolates with MIC < or = 4 mcg/mL requires additional testing.) For uncomplicated UTI caused by E. coli, K. pneumoniae or P. mirabilis: Cefazolin is susceptible if MIC <32 mcg/mL and predicts susceptible to the oral agents cefaclor, cefdinir, cefpodoxime, cefprozil, cefuroxime, cephalexin and loracarbef. Legend: S = Susceptible  I = Intermediate R = Resistant  NS = Not susceptible * = Not tested  NR = Not reported **NN = See antimicrobic comments      Fall Risk:     11/29/2021    8:00 AM 11/05/2021    1:27 PM 07/06/2021    3:01 PM 04/29/2021    4:05 PM 03/08/2021    3:19 PM  [place] in the past year? 0 0 0 0 0  Number falls in past yr: 0 0 0 0 0  Injury with Fall? 0 0 0 0 0  Risk for fall due to : No Fall Risks No Fall Risks No Fall Risks  No Fall Risks  Follow up Falls prevention discussed Falls prevention discussed Falls prevention discussed  Falls prevention discussed     Functional Status Survey: Is the patient deaf or have difficulty hearing?: No Does the patient have difficulty seeing, even when wearing glasses/contacts?: Yes Does the patient have difficulty concentrating, remembering, or making decisions?: No Does the patient have difficulty walking or climbing stairs?: No Does the patient have difficulty dressing or bathing?: No Does the patient have difficulty doing errands alone such as visiting a doctor's office or shopping?: No   Assessment & Plan  1. Well adult exam  Discussed regular physical activity  Eye exam and dental exams are due   2. Osteoporosis screening  - DG Bone Density; Future  3. Breast cancer screening other than mammogram  - MM Digital Screening; Future  4. Family history of breast cancer  Discussed genetic testing but not interested at this time  Reminded her to go next door and get appointment scheduled to evaluate ovaian cyst     -USPSTF grade A and B recommendations reviewed with patient; age-appropriate recommendations, preventive care, screening tests, etc discussed and encouraged; healthy living encouraged; see AVS for patient education given to patient -Discussed importance of 150 minutes of physical activity weekly, eat two servings of fish weekly, eat one serving of tree nuts ( cashews, pistachios, pecans, almonds.Marland Kitchen) every other day, eat 6 servings of fruit/vegetables daily and drink plenty of water and avoid sweet beverages.   -Reviewed Health Maintenance: Yes.

## 2021-11-29 ENCOUNTER — Ambulatory Visit (INDEPENDENT_AMBULATORY_CARE_PROVIDER_SITE_OTHER): Payer: Commercial Managed Care - PPO | Admitting: Family Medicine

## 2021-11-29 ENCOUNTER — Encounter: Payer: Self-pay | Admitting: Family Medicine

## 2021-11-29 VITALS — BP 132/70 | HR 76 | Resp 16 | Ht 67.0 in | Wt 208.0 lb

## 2021-11-29 DIAGNOSIS — Z803 Family history of malignant neoplasm of breast: Secondary | ICD-10-CM | POA: Diagnosis not present

## 2021-11-29 DIAGNOSIS — Z0000 Encounter for general adult medical examination without abnormal findings: Secondary | ICD-10-CM

## 2021-11-29 DIAGNOSIS — Z1239 Encounter for other screening for malignant neoplasm of breast: Secondary | ICD-10-CM

## 2021-11-29 DIAGNOSIS — Z13820 Encounter for screening for osteoporosis: Secondary | ICD-10-CM | POA: Diagnosis not present

## 2021-11-30 ENCOUNTER — Encounter: Payer: Commercial Managed Care - PPO | Admitting: Obstetrics and Gynecology

## 2021-12-16 ENCOUNTER — Encounter: Payer: Self-pay | Admitting: Obstetrics and Gynecology

## 2021-12-16 ENCOUNTER — Ambulatory Visit: Payer: Commercial Managed Care - PPO | Admitting: Obstetrics and Gynecology

## 2021-12-16 VITALS — BP 126/73 | HR 68 | Resp 16 | Ht 65.0 in | Wt 212.5 lb

## 2021-12-16 DIAGNOSIS — N83209 Unspecified ovarian cyst, unspecified side: Secondary | ICD-10-CM

## 2021-12-16 DIAGNOSIS — N390 Urinary tract infection, site not specified: Secondary | ICD-10-CM

## 2021-12-16 DIAGNOSIS — E1129 Type 2 diabetes mellitus with other diabetic kidney complication: Secondary | ICD-10-CM

## 2021-12-16 DIAGNOSIS — E1122 Type 2 diabetes mellitus with diabetic chronic kidney disease: Secondary | ICD-10-CM | POA: Diagnosis not present

## 2021-12-16 DIAGNOSIS — N838 Other noninflammatory disorders of ovary, fallopian tube and broad ligament: Secondary | ICD-10-CM

## 2021-12-16 DIAGNOSIS — N186 End stage renal disease: Secondary | ICD-10-CM

## 2021-12-16 DIAGNOSIS — Z87440 Personal history of urinary (tract) infections: Secondary | ICD-10-CM

## 2021-12-16 DIAGNOSIS — R809 Proteinuria, unspecified: Secondary | ICD-10-CM

## 2021-12-16 DIAGNOSIS — I120 Hypertensive chronic kidney disease with stage 5 chronic kidney disease or end stage renal disease: Secondary | ICD-10-CM | POA: Diagnosis not present

## 2021-12-16 DIAGNOSIS — Z87891 Personal history of nicotine dependence: Secondary | ICD-10-CM

## 2021-12-16 DIAGNOSIS — I10 Essential (primary) hypertension: Secondary | ICD-10-CM

## 2021-12-16 DIAGNOSIS — Z7984 Long term (current) use of oral hypoglycemic drugs: Secondary | ICD-10-CM

## 2021-12-16 DIAGNOSIS — Z992 Dependence on renal dialysis: Secondary | ICD-10-CM

## 2021-12-16 LAB — POCT URINALYSIS DIPSTICK
Bilirubin, UA: NEGATIVE
Blood, UA: NEGATIVE
Glucose, UA: POSITIVE — AB
Ketones, UA: NEGATIVE
Nitrite, UA: NEGATIVE
Protein, UA: POSITIVE — AB
Spec Grav, UA: 1.02 (ref 1.010–1.025)
Urobilinogen, UA: 0.2 E.U./dL
pH, UA: 6 (ref 5.0–8.0)

## 2021-12-16 NOTE — Progress Notes (Signed)
OBSTETRICS AND GYNECOLOGY CLINIC ENCOUNTER NOTE  Subjective:       Cheryl Barr is a 66 y.o. G40P3003 female here for consultation for evaluation of an ovarian cyst that was found on her left ovary.  Reports that she has end-stage renal disease due to diabetes and was undergoing a complete evaluation at [facility] as she has been placed on the transplant list.  While undergoing evaluation incidental finding of left large ovarian cyst was noted.  Patient denies any symptoms of pelvic pain or postmenopausal bleeding.   Patient also has complaints about recurrent UTI's.  States that over the past 2 months she has been treated twice for UTI's. Notes symptoms improve while taking the antibiotics, but then return soon after.    Gynecologic History No LMP recorded. Patient is postmenopausal. Contraception: post menopausal status Last Pap: 11/21/2019. Results were: normal Last mammogram: 05/04/2021. Results were: normal Last Colonoscopy:  Last Dexa Scan:    Obstetric History OB History  Gravida Para Term Preterm AB Living  '3 3 3 '$ 0 0 3  SAB IAB Ectopic Multiple Live Births  0 0 0 0      # Outcome Date GA Lbr Len/2nd Weight Sex Delivery Anes PTL Lv  3 Term           2 Term           1 Term             Obstetric Comments  1st Menstrual Cycle:  12   1st Pregnancy:  89  Menopause age 27    Past Medical History:  Diagnosis Date   Arthritis    Colon polyp 2009   Depression    Diabetes mellitus    Type 2   Fibromyalgia    Gout    Hyperlipidemia    Hypertension    Insomnia    Menopause    Vitamin D deficiency     Family History  Problem Relation Age of Onset   Hypertension Mother    Diabetes Mother    Heart attack Father    Coronary artery disease Father    Breast cancer Father 58   Diabetes Sister    Gout Brother    Heart disease Brother    Allergies Son        Barista    Past Surgical History:  Procedure Laterality Date   BREAST BIOPSY Left 05/24/2013    neg   BREAST EXCISIONAL BIOPSY Left 06/21/2013   neg/ dr. Bary Castilla   BREAST SURGERY Left 2015   COLONOSCOPY  2009   1 polyp found. Dr. Roselyn Reef   COLONOSCOPY WITH PROPOFOL N/A 05/04/2021   Procedure: COLONOSCOPY WITH PROPOFOL;  Surgeon: Lucilla Lame, MD;  Location: [facility] ENDOSCOPY;  Service: Endoscopy;  Laterality: N/A;   HERNIA REPAIR  62/70/3500   Ventral/umbilical hernia repair with 10 x 10 cm Atrium mesh in preperitoneal position.   INSERTION OF MESH N/A 10/09/2014   Procedure: INSERTION OF MESH;  Surgeon: Robert Bellow, MD;  Location: [facility] ORS;  Service: General;  Laterality: N/A;   KNEE SURGERY Left 01/14/2009   STRABISMUS SURGERY     Repair as a child   TUBAL LIGATION  1990   VENTRAL HERNIA REPAIR N/A 10/09/2014   Procedure: HERNIA REPAIR VENTRAL ADULT;  Surgeon: Robert Bellow, MD;  Location: [facility] ORS;  Service: General;  Laterality: N/A;    Social History   Socioeconomic History   Marital status: Widowed    Spouse name:  Not on file   Number of children: 3   Years of education: 12th Grade   Highest education level: Not on file  Occupational History   Occupation: kitchen prep     Employer: [employer]  Tobacco Use   Smoking status: Former    Types: Cigarettes    Quit date: 10/02/1983    Years since quitting: 38.2   Smokeless tobacco: Never  Vaping Use   Vaping Use: Never used  Substance and Sexual Activity   Alcohol use: No    Alcohol/week: 0.0 standard drinks of alcohol   Drug use: No   Sexual activity: Yes    Partners: Male    Birth control/protection: None    Comment: declined condoms   Other Topics Concern   Not on file  Social History Narrative   Widowed. Living with boyfriend    No regular exercise.   Two other brothers one adopted since 79 days old, died in 05-20-20 with liver cancer, also has a foster brother    Social Determinants of Radio broadcast assistant Strain: Low Risk  (11/29/2021)   Overall Financial Resource Strain (CARDIA)     Difficulty of Paying Living Expenses: Not hard at all  Food Insecurity: No Food Insecurity (11/29/2021)   Hunger Vital Sign    Worried About Running Out of Food in the Last Year: Never true    Ran Out of Food in the Last Year: Never true  Transportation Needs: No Transportation Needs (11/26/2020)   PRAPARE - Hydrologist (Medical): No    Lack of Transportation (Non-Medical): No  Physical Activity: Inactive (11/29/2021)   Exercise Vital Sign    Days of Exercise per Week: 0 days    Minutes of Exercise per Session: 0 min  Stress: No Stress Concern Present (11/29/2021)   [place]    Feeling of Stress : Not at all  Social Connections: Moderately Integrated (11/29/2021)   Social Connection and Isolation Panel [NHANES]    Frequency of Communication with Friends and Family: More than three times a week    Frequency of Social Gatherings with Friends and Family: Three times a week    Attends Religious Services: More than 4 times per year    Active Member of Clubs or Organizations: Yes    Attends Archivist Meetings: More than 4 times per year    Marital Status: Widowed  Intimate Partner Violence: Not At Risk (11/29/2021)   Humiliation, Afraid, Rape, and Kick questionnaire    Fear of Current or Ex-Partner: No    Emotionally Abused: No    Physically Abused: No    Sexually Abused: No    Current Outpatient Medications on File Prior to Visit  Medication Sig Dispense Refill   albuterol (VENTOLIN HFA) 108 (90 Base) MCG/ACT inhaler Inhale 2 puffs into the lungs every 6 (six) hours as needed for wheezing or shortness of breath. 18 g 0   amLODipine-olmesartan (AZOR) 10-40 MG tablet Take 1 tablet by mouth daily. 90 tablet 1   aspirin 81 MG EC tablet Take 81 mg by mouth daily.     carvedilol (COREG) 12.5 MG tablet Take 1 tablet (12.5 mg total) by mouth 2 (two) times daily with a meal. 180 tablet 1    dapagliflozin propanediol (FARXIGA) 5 MG TABS tablet Take 1 tablet (5 mg total) by mouth daily before breakfast. 90 tablet 1   febuxostat (ULORIC) 40 MG tablet  TAKE 1 TABLET BY MOUTH ONCE DAILY FOR  GOUT  (TAKE  IN  THE  PLACE  OF  ALLOPURINOL) 90 tablet 1   glipiZIDE (GLUCOTROL XL) 2.5 MG 24 hr tablet Take 1 tablet (2.5 mg total) by mouth daily with breakfast. 90 tablet 1   hydrALAZINE (APRESOLINE) 25 MG tablet Take by mouth 3 (three) times daily.     rosuvastatin (CRESTOR) 40 MG tablet Take 1 tablet (40 mg total) by mouth daily. 90 tablet 1   Vitamin D, Ergocalciferol, (DRISDOL) 1.25 MG (50000 UNIT) CAPS capsule Take 1 capsule (50,000 Units total) by mouth once a week. 12 capsule 1   No current facility-administered medications on file prior to visit.    No Known Allergies    Review of Systems Review of Systems  All other systems reviewed and are negative.     Objective:   BP 126/73   Pulse 68   Resp 16   Ht '5\' 5"'$  (1.651 m)   Wt 212 lb 8 oz (96.4 kg)   BMI 35.36 kg/m  General appearance: alert and no distress Lungs: clear to auscultation bilaterally Heart: regular rate and rhythm, S1, S2 normal, no murmur, click, rub or gallop Abdomen: soft, non-tender; bowel sounds normal; no masses,  no organomegaly Pelvic: external genitalia normal, rectovaginal septum normal.  Vagina without discharge, mild atrophy.  Cervix normal appearing, no lesions and no motion tenderness.  Uterus mobile, nontender, normal shape and size.  Adnexae non-palpable, nontender bilaterally.  Extremities: extremities normal, atraumatic, no cyanosis or edema Neurologic: Grossly normal   Labs:  Reviewed from Care Everywhere  CBC -  3.8>9.2/29.2<301 Renal function panel - Cr 2.11   Lab Results  Component Value Date   HGBA1C 5.8 (A) 11/05/2021    Results for orders placed or performed in visit on 12/16/21  Ovarian Malignancy Risk-ROMA  Result Value Ref Range   Cancer Antigen (CA) 125 10.2 0.0 -  38.1 U/mL   HE4 317.0 (H) 0.0 - 96.5 pmol/L   Premenopausal ROMA 8.64 (H) See below   Postmenopausal ROMA 4.01 (H) See below   Comment Comment   Premenopausal Interp: HIGH  Result Value Ref Range   Premenopausal Interp: HIGH Comment   Postmenopausal Interp: HIGH  Result Value Ref Range   Postmenopausal Interp: HIGH Comment   POCT Urinalysis Dipstick  Result Value Ref Range   Color, UA     Clarity, UA     Glucose, UA Positive (A) Negative   Bilirubin, UA Negative    Ketones, UA Negative    Spec Grav, UA 1.020 1.010 - 1.025   Blood, UA Negative    pH, UA 6.0 5.0 - 8.0   Protein, UA Positive (A) Negative   Urobilinogen, UA 0.2 0.2 or 1.0 E.U./dL   Nitrite, UA Negative    Leukocytes, UA Moderate (2+) (A) Negative   Appearance     Odor       Imaging:  EXAM: [place] ENDOVAGINAL (NON-OB)  DATE: 08/23/2021  ACCESSION: [id number] UN  DICTATED: 08/23/2021 11:24 AM  INTERPRETATION LOCATION: [place]   CLINICAL INDICATION: 66 years old Female with abnormal CT abd/pelvis  - Z01.818 - Pre - transplant evaluation for kidney transplant - N18.6 - ESRD (end stage renal disease) (CMS - HCC) - R93.5 - Abnormal computed tomography of abdomen and pelvis  LMP: pm    COMPARISON: CT of abdomen and pelvis dated 07/26/2021, and other earlier studies.   TECHNIQUE: Ultrasound views of the pelvis were obtained endovaginally and  transabdominally using gray scale and color Doppler imaging.   FINDINGS:   UTERUS/CERVIX: Anteverted in position. The uterus is somewhat enlarged and irregular in contour with multiple fibroids seen throughout. Many of which demonstrate small echogenic foci centrally, with associated posterior acoustic shadowing, which limits evaluation.   Multiple fibroids distort normal uterine anatomy and limit evaluation of the endometrium. Several nabothian cysts are identified.   OVARIES:  The left ovary was not confidently identified. Note is made of a large lobulated cystic  structure within the left adnexal region, which measures 6.8 x 3.4 x 3.5 cm. This lesion demonstrates smooth inner walls with a single smooth internal septation identified. There is no increased vascularity on color Doppler imaging.   Otherwise, the right ovary was grossly unremarkable in appearance measuring 3.5 x 2.8 x 1.6 cm. Several small follicles versus small ovarian cysts are seen within the right ovary.   OTHER: Small volume free fluid noted with Procedure Note  Gar Gibbon, MD - 08/23/2021  Formatting of this note might be different from the original.  EXAM: [place] ENDOVAGINAL (NON-OB)  DATE: 08/23/2021  ACCESSION: [id number] UN  DICTATED: 08/23/2021 11:24 AM  INTERPRETATION LOCATION: [place]   CLINICAL INDICATION: 66 years old Female with abnormal CT abd/pelvis  - Z01.818 - Pre - transplant evaluation for kidney transplant - N18.6 - ESRD (end stage renal disease) (CMS - HCC) - R93.5 - Abnormal computed tomography of abdomen and pelvis  LMP: pm    COMPARISON: CT of abdomen and pelvis dated 07/26/2021, and other earlier studies.   TECHNIQUE: Ultrasound views of the pelvis were obtained endovaginally and transabdominally using gray scale and color Doppler imaging.   FINDINGS:   UTERUS/CERVIX: Anteverted in position. The uterus is somewhat enlarged and irregular in contour with multiple fibroids seen throughout. Many of which demonstrate small echogenic foci centrally, with associated posterior acoustic shadowing, which limits evaluation.   Multiple fibroids distort normal uterine anatomy and limit evaluation of the endometrium. Several nabothian cysts are identified.   OVARIES:  The left ovary was not confidently identified. Note is made of a large lobulated cystic structure within the left adnexal region, which measures 6.8 x 3.4 x 3.5 cm. This lesion demonstrates smooth inner walls with a single smooth internal septation identified. There is no increased vascularity on color  Doppler imaging.   Otherwise, the right ovary was grossly unremarkable in appearance measuring 3.5 x 2.8 x 1.6 cm. Several small follicles versus small ovarian cysts are seen within the right ovary.   OTHER: Small volume free fluid noted with   IMPRESSION:  1.The uterus is enlarged and irregular in contour with multiple partially-calcified fibroid seen throughout. It is important to note associated distortion of normal uterine anatomy and posterior acoustic shadowing somewhat limits evaluation of the pelvic viscera.  2.Lobulated cystic lesion arising from the left adnexal region, demonstrates smooth internal walls and measures up to 6.8 cm in greatest dimension. A single smooth internal septation identified. Finding could potentially reflect a peritoneal inclusion cyst versus other paraovarian lesion, however, the left ovary is not confidently identified and a potential cystic ovarian neoplasm is not entirely excluded. Further evaluation with dedicated pelvic MRI is recommended for more definitive characterization.   Spectral doppler was not performed.  Additional transabdominal images were obtained.  3D reconstruction images were not obtained.     EXAM: MRI pelvis without contrast  DATE: 10/25/2021 11:33 AM  ACCESSION: [id number] UN  DICTATED: 10/26/2021 10:39 AM  INTERPRETATION LOCATION: [place]  CLINICAL INDICATION: 6.8 cm "lobulated cystic lesion arising from the left adnexal region"  seen on endovaginal [place] ; Adnexal mass, indeterminate, post - menopausal  - Z01.818 - Pre - transplant evaluation for kidney transplant - N18.6 - ESRD (end stage renal disease) (CMS - H    COMPARISON: Endovaginal ultrasound dated 08/23/2021, and other earlier studies.   TECHNIQUE: MRI of the pelvis was obtained without IV contrast. Multisequence, multiplanar images were obtained.     FINDINGS:  BLADDER: Urinary bladder is mildly with urine, and otherwise unremarkable in appearance. Note is made of  a small diverticulum along the left posterolateral aspect (9:12), which measures up to 1.4 cm in greatest axial dimension.   PELVIC/REPRODUCTIVE ORGANS:   UTERUS: Anteverted and retroflexed in position. The uterus is enlarged and irregular in contour, measuring 6.3 x 10.7 x 10.7 cm (transverse by AP by craniocaudal).   The endometrium is homogenous measuring 7 mm   The junctional zone measures 3 mm without focal thickening.   The cervix is unremarkable.   FIBROID BURDEN: 6-10.  Dominant fibroids described below:   Fibroid 1 (9:26): Right anterior fundal FIGO 6: Subserosal, < 50% intramural fibroid measuring 5.3 cm.   Fibroid 2 (9:26):  Left lateral fundal FIGO 2-5, Spanning serosa to mucosa fibroid measuring 3.6 cm, and mixed contact with the overlying mucosa.   Fibroid 3 (9:20):  Midline posterior lower uterine segment FIGO 5: Subserosal, >50% intramural fibroid measuring 3.6 cm.   VAGINA/INTROITUS: The vagina and introitus are normal. No inflammation, cyst or solid mass.   OVARIES:  The bilateral ovaries are normal in size (9:19; 9:30), with scattered follicles seen throughout.   Note is again made of a large multicystic lesion which appears to arise from the left ovary (11:21; 9:22), which measures up to 7.7 cm in greatest axial dimension (previously measuring up to 7.9 cm). This lesion is without internal soft tissue nodularity, however, demonstrates several thin internal septations (9:22). These findings are nonspecific, however, may reflect a partially-exophytic septated ovarian cyst (O-RADS MRI 3).   Additional note is made of a T1 hyperintense (12:45), T2 hypointense (9:17), lesion which arises from the posteroinferior aspect of the right ovary, thought most consistent with a partially-exophytic hemorrhagic ovarian cyst. This lesion measures up to 1.8 cm in greatest axial dimension.   GI TRACT: No evidence of bowel thickening or obstruction. Colonic diverticulosis. No acute  inflammatory process.   PERITONEUM/RETROPERITONEUM AND MESENTERY: No intraperitoneal free fluid. No obvious peritoneal, mesenteric, or omental nodularity identified.   LYMPH NODES: No pathologically enlarged lymph nodes are identified throughout the abdomen or pelvis.   VESSELS: The visualized abdominal aorta is grossly normal in size and caliber. Otherwise, evaluation of the vascular structures is limited in absence of IV contrast.   BONES AND SOFT TISSUES: Mild multilevel degenerative changes are seen throughout spine. Redemonstrated right gluteal lipoma (12:46).  Assessment:   1. Ovarian mass, left   2. Recurrent UTI   3. End stage kidney disease ()   4. Hypertension, essential, benign   5. Controlled type 2 diabetes mellitus with microalbuminuria, without long-term current use of insulin ([place])      Plan:   1. Ovarian mass, left - I reviewed the findings of left ovarian mass noted on recent pelvic ultrasound and MRI.  Initial findings were noted on CT scan performed for evaluation as patient is on renal transplant list.  I discussed that although the cyst does not appear to be malignant, there is always a concern  in a postmenopausal female with a large cyst and so cancer must be ruled out. Ovarian Malignancy Risk-ROMA testing performed today. -Discussed need for removal as the cyst is of a decent size.  Patient also notes that any findings like these would prohibit her from being able to receive a renal transplant if she were selected and so would like to have the cyst removed.  Currently is asymptomatic.  I discussed the procedure of removal of ovarian cyst.  Would be performed laparoscopically.  Discussed risk and benefits of the procedure.  Advised that this will be an outpatient procedure, and reviewed postoperative recovery expectations..  Patient notes understanding.  I included information for her to review in her after visit summary which will be printed for her after today's  visit.  Patient would like to go ahead and proceed with scheduling her surgery.  We will schedule for 01/10/2022.  2. Recurrent UTI -Patient reports treatment of 2 UTIs in the past 2 to 3 months.  Review of chart notes at least 1 culture with E. Coli.  - POCT Urinalysis Dipstick and Urine Culture ordered today.  If positive again for similar bacteria, will advise on ways to improve hygiene and reduce risk of recurrent UTIs.  UA with glucose present.   3. End stage kidney disease ([place]) -Patient with end-stage renal disease due to diabetes.  Is undergoing evaluation for placement on transplant list. - Will have patient check with Nephrologist to ensure no concerns pre or postoperatively.   4. Hypertension, essential, benign -Currently well controlled.  5. Controlled type 2 diabetes mellitus with microalbuminuria, without long-term current use of insulin ([facility]) - Recent HgbA1c notes good glucose control.     A total of 45 minutes were spent during this encounter, including review of previous progress notes, recent imaging and labs, face-to-face with time with patient involving counseling and coordination of care, as well as documentation for current visit.   Rubie Maid, MD [place]

## 2021-12-16 NOTE — Patient Instructions (Addendum)
GYNECOLOGY PRE-OPERATIVE INSTRUCTIONS  You are scheduled for surgery on 01/10/2022.  The name of your procedure is: Laparoscopic Left Salpingo-Oophorectomy .   Please read through these instructions carefully regarding preparation for your surgery: Nothing to eat after midnight on the day prior to surgery.  Do not take any medications unless recommended by your provider on day prior to surgery.  Do not take NSAIDs (Motrin, Aleve) or aspirin 7 days prior to surgery.  You may take Tylenol products for minor aches and pains.  You will receive a prescription for pain medications post-operatively.  Instructions will be given to you on when and where to have this performed.  Please call the office if you have any questions regarding your upcoming surgery.    Thank you for choosing  OB/GYN at [facility]

## 2021-12-17 ENCOUNTER — Other Ambulatory Visit: Payer: Self-pay | Admitting: Obstetrics and Gynecology

## 2021-12-17 DIAGNOSIS — Z01818 Encounter for other preprocedural examination: Secondary | ICD-10-CM

## 2021-12-17 LAB — OVARIAN MALIGNANCY RISK-ROMA
Cancer Antigen (CA) 125: 10.2 U/mL (ref 0.0–38.1)
HE4: 317 pmol/L — ABNORMAL HIGH (ref 0.0–96.5)
Postmenopausal ROMA: 4.01 — ABNORMAL HIGH
Premenopausal ROMA: 8.64 — ABNORMAL HIGH

## 2021-12-17 LAB — POSTMENOPAUSAL INTERP: HIGH

## 2021-12-17 LAB — PREMENOPAUSAL INTERP: HIGH

## 2021-12-17 NOTE — H&P (Signed)
GYENCOLOGY PRE-OPERATIVE HISTORY AND PHYSICAL  Subjective:       Cheryl Barr is a 66 y.o. [id number] female here for surgical management of an ovarian cyst that was found on her left ovary.  Reports that she has end-stage renal disease due to diabetes and was undergoing a complete evaluation at [facility] last month as she has is being placed on the transplant list.  While undergoing evaluation incidental finding of left large ovarian cyst was noted.  Patient denies any symptoms of pelvic pain or postmenopausal bleeding.    Gynecologic History No LMP recorded. Patient is postmenopausal. Contraception: post menopausal status Last Pap: 11/21/2019. Results were: normal Last mammogram: 05/04/2021. Results were: normal Last Colonoscopy:  Last Dexa Scan:    Obstetric History OB History  Gravida Para Term Preterm AB Living  '3 3 3 '$ 0 0 3  SAB IAB Ectopic Multiple Live Births  0 0 0 0      # Outcome Date GA Lbr Len/2nd Weight Sex Delivery Anes PTL Lv  3 Term           2 Term           1 Term             Obstetric Comments  1st Menstrual Cycle:  12   1st Pregnancy:  21  Menopause age 74    Past Medical History:  Diagnosis Date   Arthritis    Colon polyp 2009   Depression    Diabetes mellitus    Type 2   Fibromyalgia    Gout    Hyperlipidemia    Hypertension    Insomnia    Menopause    Vitamin D deficiency     Family History  Problem Relation Age of Onset   Hypertension Mother    Diabetes Mother    Heart attack Father    Coronary artery disease Father    Breast cancer Father 69   Diabetes Sister    Gout Brother    Heart disease Brother    Allergies Son        Barista    Past Surgical History:  Procedure Laterality Date   BREAST BIOPSY Left 05/24/2013   neg   BREAST EXCISIONAL BIOPSY Left 06/21/2013   neg/ dr. Bary Castilla   BREAST SURGERY Left 2015   COLONOSCOPY  2009   1 polyp found. Dr. Roselyn Reef   COLONOSCOPY WITH PROPOFOL N/A 05/04/2021   Procedure:  COLONOSCOPY WITH PROPOFOL;  Surgeon: Lucilla Lame, MD;  Location: [facility] ENDOSCOPY;  Service: Endoscopy;  Laterality: N/A;   HERNIA REPAIR  03/50/0938   Ventral/umbilical hernia repair with 10 x 10 cm Atrium mesh in preperitoneal position.   INSERTION OF MESH N/A 10/09/2014   Procedure: INSERTION OF MESH;  Surgeon: Robert Bellow, MD;  Location: [facility] ORS;  Service: General;  Laterality: N/A;   KNEE SURGERY Left 01/14/2009   STRABISMUS SURGERY     Repair as a child   TUBAL LIGATION  1990   VENTRAL HERNIA REPAIR N/A 10/09/2014   Procedure: HERNIA REPAIR VENTRAL ADULT;  Surgeon: Robert Bellow, MD;  Location: [facility] ORS;  Service: General;  Laterality: N/A;    Social History   Socioeconomic History   Marital status: Widowed    Spouse name: Not on file   Number of children: 3   Years of education: 12th Grade   Highest education level: Not on file  Occupational History   Occupation: kitchen prep  Employer: [employer]  Tobacco Use   Smoking status: Former    Types: Cigarettes    Quit date: 10/02/1983    Years since quitting: 38.2   Smokeless tobacco: Never  Vaping Use   Vaping Use: Never used  Substance and Sexual Activity   Alcohol use: No    Alcohol/week: 0.0 standard drinks of alcohol   Drug use: No   Sexual activity: Yes    Partners: Male    Birth control/protection: None    Comment: declined condoms   Other Topics Concern   Not on file  Social History Narrative   Widowed. Living with boyfriend    No regular exercise.   Two other brothers one adopted since 21 days old, died in 05/09/20 with liver cancer, also has a foster brother    Social Determinants of Radio broadcast assistant Strain: Low Risk  (11/29/2021)   Overall Financial Resource Strain (CARDIA)    Difficulty of Paying Living Expenses: Not hard at all  Food Insecurity: No Food Insecurity (11/29/2021)   Hunger Vital Sign    Worried About Running Out of Food in the Last Year: Never true    Ran Out  of Food in the Last Year: Never true  Transportation Needs: No Transportation Needs (11/26/2020)   PRAPARE - Hydrologist (Medical): No    Lack of Transportation (Non-Medical): No  Physical Activity: Inactive (11/29/2021)   Exercise Vital Sign    Days of Exercise per Week: 0 days    Minutes of Exercise per Session: 0 min  Stress: No Stress Concern Present (11/29/2021)   [place]    Feeling of Stress : Not at all  Social Connections: Moderately Integrated (11/29/2021)   Social Connection and Isolation Panel [NHANES]    Frequency of Communication with Friends and Family: More than three times a week    Frequency of Social Gatherings with Friends and Family: Three times a week    Attends Religious Services: More than 4 times per year    Active Member of Clubs or Organizations: Yes    Attends Archivist Meetings: More than 4 times per year    Marital Status: Widowed  Intimate Partner Violence: Not At Risk (11/29/2021)   Humiliation, Afraid, Rape, and Kick questionnaire    Fear of Current or Ex-Partner: No    Emotionally Abused: No    Physically Abused: No    Sexually Abused: No    Current Outpatient Medications on File Prior to Visit  Medication Sig Dispense Refill   albuterol (VENTOLIN HFA) 108 (90 Base) MCG/ACT inhaler Inhale 2 puffs into the lungs every 6 (six) hours as needed for wheezing or shortness of breath. 18 g 0   amLODipine-olmesartan (AZOR) 10-40 MG tablet Take 1 tablet by mouth daily. 90 tablet 1   aspirin 81 MG EC tablet Take 81 mg by mouth daily.     carvedilol (COREG) 12.5 MG tablet Take 1 tablet (12.5 mg total) by mouth 2 (two) times daily with a meal. 180 tablet 1   dapagliflozin propanediol (FARXIGA) 5 MG TABS tablet Take 1 tablet (5 mg total) by mouth daily before breakfast. 90 tablet 1   febuxostat (ULORIC) 40 MG tablet TAKE 1 TABLET BY MOUTH ONCE DAILY FOR   GOUT  (TAKE  IN  THE  PLACE  OF  ALLOPURINOL) 90 tablet 1   glipiZIDE (GLUCOTROL XL) 2.5 MG 24 hr tablet Take  1 tablet (2.5 mg total) by mouth daily with breakfast. 90 tablet 1   hydrALAZINE (APRESOLINE) 25 MG tablet Take by mouth 3 (three) times daily.     rosuvastatin (CRESTOR) 40 MG tablet Take 1 tablet (40 mg total) by mouth daily. 90 tablet 1   Vitamin D, Ergocalciferol, (DRISDOL) 1.25 MG (50000 UNIT) CAPS capsule Take 1 capsule (50,000 Units total) by mouth once a week. 12 capsule 1   No current facility-administered medications on file prior to visit.    No Known Allergies    Review of Systems Review of Systems  All other systems reviewed and are negative.     Objective:   BP 126/73   Pulse 68   Resp 16   Ht '5\' 5"'$  (1.651 m)   Wt 212 lb 8 oz (96.4 kg)   BMI 35.36 kg/m  General appearance: alert and no distress Lungs: clear to auscultation bilaterally Heart: regular rate and rhythm, S1, S2 normal, no murmur, click, rub or gallop Abdomen: soft, non-tender; bowel sounds normal; no masses,  no organomegaly Pelvic: external genitalia normal, rectovaginal septum normal.  Vagina without discharge, mild atrophy.  Cervix normal appearing, no lesions and no motion tenderness.  Uterus mobile, nontender, normal shape and size.  Adnexae non-palpable, nontender bilaterally.  Extremities: extremities normal, atraumatic, no cyanosis or edema Neurologic: Grossly normal   Labs:  Reviewed from Care Everywhere  CBC -  3.8>9.2/29.2<301 Renal function panel - Cr 2.11   Lab Results  Component Value Date   HGBA1C 5.8 (A) 11/05/2021    Results for orders placed or performed in visit on 12/16/21  Ovarian Malignancy Risk-ROMA  Result Value Ref Range   Cancer Antigen (CA) 125 10.2 0.0 - 38.1 U/mL   HE4 317.0 (H) 0.0 - 96.5 pmol/L   Premenopausal ROMA 8.64 (H) See below   Postmenopausal ROMA 4.01 (H) See below   Comment Comment   Premenopausal Interp: HIGH  Result Value Ref Range    Premenopausal Interp: HIGH Comment   Postmenopausal Interp: HIGH  Result Value Ref Range   Postmenopausal Interp: HIGH Comment   POCT Urinalysis Dipstick  Result Value Ref Range   Color, UA     Clarity, UA     Glucose, UA Positive (A) Negative   Bilirubin, UA Negative    Ketones, UA Negative    Spec Grav, UA 1.020 1.010 - 1.025   Blood, UA Negative    pH, UA 6.0 5.0 - 8.0   Protein, UA Positive (A) Negative   Urobilinogen, UA 0.2 0.2 or 1.0 E.U./dL   Nitrite, UA Negative    Leukocytes, UA Moderate (2+) (A) Negative   Appearance     Odor       Imaging:  EXAM: [place] ENDOVAGINAL (NON-OB)  DATE: 08/23/2021  ACCESSION: [id number]  DICTATED: 08/23/2021 11:24 AM  INTERPRETATION LOCATION: [place]   CLINICAL INDICATION: 66 years old Female with abnormal CT abd/pelvis  - Z01.818 - Pre - transplant evaluation for kidney transplant - N18.6 - ESRD (end stage renal disease) (CMS - HCC) - R93.5 - Abnormal computed tomography of abdomen and pelvis  LMP: pm    COMPARISON: CT of abdomen and pelvis dated 07/26/2021, and other earlier studies.   TECHNIQUE: Ultrasound views of the pelvis were obtained endovaginally and transabdominally using gray scale and color Doppler imaging.   FINDINGS:   UTERUS/CERVIX: Anteverted in position. The uterus is somewhat enlarged and irregular in contour with multiple fibroids seen throughout. Many of which demonstrate small  echogenic foci centrally, with associated posterior acoustic shadowing, which limits evaluation.   Multiple fibroids distort normal uterine anatomy and limit evaluation of the endometrium. Several nabothian cysts are identified.   OVARIES:  The left ovary was not confidently identified. Note is made of a large lobulated cystic structure within the left adnexal region, which measures 6.8 x 3.4 x 3.5 cm. This lesion demonstrates smooth inner walls with a single smooth internal septation identified. There is no increased vascularity  on color Doppler imaging.   Otherwise, the right ovary was grossly unremarkable in appearance measuring 3.5 x 2.8 x 1.6 cm. Several small follicles versus small ovarian cysts are seen within the right ovary.   OTHER: Small volume free fluid noted with Procedure Note  Gar Gibbon, MD - 08/23/2021  Formatting of this note might be different from the original.  EXAM: [place] ENDOVAGINAL (NON-OB)  DATE: 08/23/2021  ACCESSION: [id number] UN  DICTATED: 08/23/2021 11:24 AM  INTERPRETATION LOCATION: [place]   CLINICAL INDICATION: 66 years old Female with abnormal CT abd/pelvis  - Z01.818 - Pre - transplant evaluation for kidney transplant - N18.6 - ESRD (end stage renal disease) (CMS - HCC) - R93.5 - Abnormal computed tomography of abdomen and pelvis  LMP: pm    COMPARISON: CT of abdomen and pelvis dated 07/26/2021, and other earlier studies.   TECHNIQUE: Ultrasound views of the pelvis were obtained endovaginally and transabdominally using gray scale and color Doppler imaging.   FINDINGS:   UTERUS/CERVIX: Anteverted in position. The uterus is somewhat enlarged and irregular in contour with multiple fibroids seen throughout. Many of which demonstrate small echogenic foci centrally, with associated posterior acoustic shadowing, which limits evaluation.   Multiple fibroids distort normal uterine anatomy and limit evaluation of the endometrium. Several nabothian cysts are identified.   OVARIES:  The left ovary was not confidently identified. Note is made of a large lobulated cystic structure within the left adnexal region, which measures 6.8 x 3.4 x 3.5 cm. This lesion demonstrates smooth inner walls with a single smooth internal septation identified. There is no increased vascularity on color Doppler imaging.   Otherwise, the right ovary was grossly unremarkable in appearance measuring 3.5 x 2.8 x 1.6 cm. Several small follicles versus small ovarian cysts are seen within the right ovary.    OTHER: Small volume free fluid noted with   IMPRESSION:  1.The uterus is enlarged and irregular in contour with multiple partially-calcified fibroid seen throughout. It is important to note associated distortion of normal uterine anatomy and posterior acoustic shadowing somewhat limits evaluation of the pelvic viscera.  2.Lobulated cystic lesion arising from the left adnexal region, demonstrates smooth internal walls and measures up to 6.8 cm in greatest dimension. A single smooth internal septation identified. Finding could potentially reflect a peritoneal inclusion cyst versus other paraovarian lesion, however, the left ovary is not confidently identified and a potential cystic ovarian neoplasm is not entirely excluded. Further evaluation with dedicated pelvic MRI is recommended for more definitive characterization.   Spectral doppler was not performed.  Additional transabdominal images were obtained.  3D reconstruction images were not obtained.     EXAM: MRI pelvis without contrast  DATE: 10/25/2021 11:33 AM  ACCESSION: [id number] UN  DICTATED: 10/26/2021 10:39 AM  INTERPRETATION LOCATION: [place]   CLINICAL INDICATION: 6.8 cm "lobulated cystic lesion arising from the left adnexal region"  seen on endovaginal [place] ; Adnexal mass, indeterminate, post - menopausal  - Z01.818 - Pre - transplant evaluation for kidney  transplant - N18.6 - ESRD (end stage renal disease) (CMS - H    COMPARISON: Endovaginal ultrasound dated 08/23/2021, and other earlier studies.   TECHNIQUE: MRI of the pelvis was obtained without IV contrast. Multisequence, multiplanar images were obtained.     FINDINGS:  BLADDER: Urinary bladder is mildly with urine, and otherwise unremarkable in appearance. Note is made of a small diverticulum along the left posterolateral aspect (9:12), which measures up to 1.4 cm in greatest axial dimension.   PELVIC/REPRODUCTIVE ORGANS:   UTERUS: Anteverted and retroflexed in  position. The uterus is enlarged and irregular in contour, measuring 6.3 x 10.7 x 10.7 cm (transverse by AP by craniocaudal).   The endometrium is homogenous measuring 7 mm   The junctional zone measures 3 mm without focal thickening.   The cervix is unremarkable.   FIBROID BURDEN: 6-10.  Dominant fibroids described below:   Fibroid 1 (9:26): Right anterior fundal FIGO 6: Subserosal, < 50% intramural fibroid measuring 5.3 cm.   Fibroid 2 (9:26):  Left lateral fundal FIGO 2-5, Spanning serosa to mucosa fibroid measuring 3.6 cm, and mixed contact with the overlying mucosa.   Fibroid 3 (9:20):  Midline posterior lower uterine segment FIGO 5: Subserosal, >50% intramural fibroid measuring 3.6 cm.   VAGINA/INTROITUS: The vagina and introitus are normal. No inflammation, cyst or solid mass.   OVARIES:  The bilateral ovaries are normal in size (9:19; 9:30), with scattered follicles seen throughout.   Note is again made of a large multicystic lesion which appears to arise from the left ovary (11:21; 9:22), which measures up to 7.7 cm in greatest axial dimension (previously measuring up to 7.9 cm). This lesion is without internal soft tissue nodularity, however, demonstrates several thin internal septations (9:22). These findings are nonspecific, however, may reflect a partially-exophytic septated ovarian cyst (O-RADS MRI 3).   Additional note is made of a T1 hyperintense (12:45), T2 hypointense (9:17), lesion which arises from the posteroinferior aspect of the right ovary, thought most consistent with a partially-exophytic hemorrhagic ovarian cyst. This lesion measures up to 1.8 cm in greatest axial dimension.   GI TRACT: No evidence of bowel thickening or obstruction. Colonic diverticulosis. No acute inflammatory process.   PERITONEUM/RETROPERITONEUM AND MESENTERY: No intraperitoneal free fluid. No obvious peritoneal, mesenteric, or omental nodularity identified.   LYMPH NODES: No pathologically  enlarged lymph nodes are identified throughout the abdomen or pelvis.   VESSELS: The visualized abdominal aorta is grossly normal in size and caliber. Otherwise, evaluation of the vascular structures is limited in absence of IV contrast.   BONES AND SOFT TISSUES: Mild multilevel degenerative changes are seen throughout spine. Redemonstrated right gluteal lipoma (12:46).  Assessment:   Ovarian mass, left - Plan: Ovarian Malignancy Risk-ROMA End stage kidney disease (HCC) Hypertension, essential, benign Controlled type 2 diabetes mellitus with microalbuminuria, without long-term current use of insulin ([place])   Plan:   1. Ovarian mass, left - I reviewed the findings of left ovarian mass noted on recent pelvic ultrasound and MRI.  Initial findings were noted on CT scan performed for evaluation as patient is on renal transplant list.  I discussed that although the cyst does not appear to be malignant, there is always a concern in a postmenopausal female with a large cyst and so cancer must be ruled out. Ovarian Malignancy Risk-ROMA testing performed today. -Discussed need for removal as the cyst is of a decent size.  Patient also notes that any findings like these would prohibit her from being able  to receive a renal transplant if she were selected and so would like to have the cyst removed.  Currently is asymptomatic.  I discussed the procedure of removal of ovarian cyst.  Would be performed laparoscopically.  Discussed risk and benefits of the procedure.  Advised that this will be an outpatient procedure, and reviewed postoperative recovery expectations..  Patient notes understanding.  I included information for her to review in her after visit summary which will be printed for her after today's visit.  Patient would like to go ahead and proceed with scheduling her surgery.  Surgery scheduled for 01/10/2022.   3. End stage kidney disease ([place]) -Patient with end-stage renal disease due to diabetes.   Is undergoing evaluation for placement on transplant list. - Will have patient check with Nephrologist for any pre-operative concerns.   4. Hypertension, essential, benign -Currently well controlled.  5. Controlled type 2 diabetes mellitus with microalbuminuria, without long-term current use of insulin (HCC) - Recent HgbA1c notes good glucose control.    Rubie Maid, MD [place]

## 2021-12-17 NOTE — Addendum Note (Signed)
Addended by: Augusto Gamble on: 12/17/2021 08:55 PM   Modules accepted: Level of Service

## 2021-12-28 LAB — URINE CULTURE

## 2021-12-28 MED ORDER — NITROFURANTOIN MONOHYD MACRO 100 MG PO CAPS: 100.0000 mg | ORAL_CAPSULE | Freq: Two times a day (BID) | ORAL | 0 refills | Status: DC

## 2021-12-28 NOTE — Progress Notes (Signed)
Ok, sounds good.  I will cancel her surgery then.

## 2021-12-28 NOTE — Addendum Note (Signed)
Addended by: Chilton Greathouse on: 12/28/2021 03:47 PM   Modules accepted: Orders

## 2022-01-03 ENCOUNTER — Other Ambulatory Visit: Payer: Commercial Managed Care - PPO

## 2022-01-10 ENCOUNTER — Ambulatory Visit: Admit: 2022-01-10 | Payer: Commercial Managed Care - PPO | Admitting: Obstetrics and Gynecology

## 2022-01-10 SURGERY — SALPINGO-OOPHORECTOMY, LAPAROSCOPIC
Anesthesia: General | Laterality: Left

## 2022-01-20 ENCOUNTER — Encounter: Payer: Commercial Managed Care - PPO | Admitting: Obstetrics and Gynecology

## 2022-01-25 ENCOUNTER — Encounter: Payer: Commercial Managed Care - PPO | Admitting: Obstetrics and Gynecology

## 2022-03-04 NOTE — Progress Notes (Signed)
Name: Cheryl Barr   MRN: [id number]    DOB: May 23, 1955   Date:03/07/2022       Progress Note  Subjective  Chief Complaint  Follow Up  HPI  HTN: compliant with medication, Azor, carvedilol Dr. Candiss Norse also added Hydralazine Spring  2023. BP is at goal today.  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 Candiss Norse,  kidney biopsy showed glomerulonephritis She is on  aspirin,  she has symptoms of meralgia paresthetica on right side for a long time, she was on Lyrica and stopped taking it again because not efficatious. She denies polyphagia, but has noticed polydipsia and polyuria.  She is currently on Farxiga low dose for kidney and glipizide 2.5 mg , she denies hypoglycemia, she is due for lipid panel. A1C is up to 6.3 % , still under control    Gout: . she is on Uloric now, and under control , last uric acid was at goal 4 back in April 2023 at Dr. Keturah Barre office She is doing well at this time    Vitamin B12 deficiency: she is not  taking otc supplementation.  Vitamin D deficiency: taking vitamin D rx weekly , continue supplementation    Chronic kidney disease: she is seeing Dr. Candiss Norse, now is CKI stage V  has secondary hyperparathyroidism , renal biopsy showed segmental glomerulonephritis . No pruritis, normal urine output  Dr. Candiss Norse prescribed her  low dose Wilder Glade for kidney protection , she is now going [facility] and is on the transplant list   Left ovarian cyst: incidental finding during evaluation for kidney transplant, she was seen by Dr. Marcelline Mates and given reassurance, however transplant team advised to have it removed surgically, she will follow up with Dr. Marcelline Mates and discuss it  Pelvic US 10/2021   1.Somewhat limited exam in absence of IV contrast.  2.O-RADS MRI 3: Redemonstration of a multilocular cystic lesion arising from the left ovary, which measures up to 7.7 cm in greatest axial dimension (previously 7.9 cm). This lesion demonstrates several thin internal septations without obvious internal soft tissue nodularity.  3.Note is also made of a small hemorrhagic cyst which arises from the right ovary, measuring up to 1.8 cm in greatest axial dimension.  4.Multi-fibroid uterus (as detailed within the body the report).  5.Other chronic or incidental findings, as described within the body of the report.   Right hand numbness: he wakes up with numbness right hand when sleeping on right arm, shakes her hand and improves, discussed possible carpal tunnel Unchanged   Cloudy urine: she denies dysuria, but would like to be checked for UTI today   Viral post-bronchial cough: she had a cold in mid December but continues to have a daily dry cough, no SOB or wheezing. She has been taking cough drops She has a remote history of RAD, seen by Dr. Stevenson Clinch and tool Dulera in the past  Patient Active Problem List   Diagnosis Date Noted   Meralgia paresthetica of right side 11/05/2021   Controlled type 2 diabetes mellitus with microalbuminuria, without long-term current use of insulin ([place]) 11/05/2021   History of colonic polyps    Polyp of  transverse colon    End stage kidney disease ([place]) 03/08/2021   Reactive airway disease 05/20/2020   Hypertension associated with type 2 diabetes mellitus ([place]) 05/20/2020   Hyperparathyroidism due to renal insufficiency ([place]) 06/12/2019   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   Anemia in chronic kidney disease 06/12/2019   History of late syphilis 11/26/2018   Incomplete rotator cuff tear or rupture of left shoulder, not specified as traumatic 07/26/2016   Thoracic aortic atherosclerosis ([place]) 01/12/2016   DDD (degenerative disc disease), thoracic  01/12/2016   B12 deficiency 01/11/2016   Hernia of abdominal cavity 09/15/2014   Dyslipidemia 08/10/2014   Interval gout 08/10/2014   Microalbuminuria 08/10/2014   Osteoarthritis of left knee 08/10/2014   Vitamin D deficiency 08/10/2014   Intraductal papilloma of breast 06/27/2013   Hypertension, essential, benign 09/02/2009   ABNORMAL EKG 09/02/2009    Past Surgical History:  Procedure Laterality Date   BREAST BIOPSY Left 05/24/2013   neg   BREAST EXCISIONAL BIOPSY Left 06/21/2013   neg/ dr. Bary Castilla   BREAST SURGERY Left 2015   COLONOSCOPY  2009   1 polyp found. Dr. Roselyn Reef   COLONOSCOPY WITH PROPOFOL N/A 05/04/2021   Procedure: COLONOSCOPY WITH PROPOFOL;  Surgeon: Lucilla Lame, MD;  Location: [facility] ENDOSCOPY;  Service: Endoscopy;  Laterality: N/A;   HERNIA REPAIR  50/35/4656   Ventral/umbilical hernia repair with 10 x 10 cm Atrium mesh in preperitoneal position.   INSERTION OF MESH N/A 10/09/2014   Procedure: INSERTION OF MESH;  Surgeon: Robert Bellow, MD;  Location: [facility] ORS;  Service: General;  Laterality: N/A;   KNEE SURGERY Left 01/14/2009   STRABISMUS SURGERY     Repair as a child   TUBAL LIGATION  1990   VENTRAL HERNIA REPAIR N/A 10/09/2014   Procedure: HERNIA REPAIR VENTRAL ADULT;  Surgeon: Robert Bellow, MD;  Location: [facility] ORS;  Service: General;  Laterality: N/A;    Family History  Problem Relation Age of Onset   Hypertension Mother    Diabetes Mother    Heart attack Father    Coronary artery disease Father    Breast cancer Father 33   Diabetes Sister    Gout Brother    Heart disease Brother    Allergies Son        Haematologist and Geographical information systems officer    Social History   Tobacco Use   Smoking status: Former    Types: Cigarettes    Quit date: 10/02/1983    Years since quitting: 38.4   Smokeless tobacco: Never  Substance Use Topics   Alcohol use: No    Alcohol/week: 0.0 standard drinks of alcohol     Current Outpatient Medications:    albuterol (VENTOLIN HFA)  108 (90 Base) MCG/ACT inhaler, Inhale 2 puffs into the lungs every 6 (six) hours as needed for wheezing or shortness of breath., Disp: 18 g, Rfl: 0   amLODipine-olmesartan (AZOR) 10-40 MG tablet, Take 1 tablet by mouth daily., Disp: 90 tablet, Rfl: 1   aspirin 81 MG EC tablet, Take 81 mg by mouth daily., Disp: , Rfl:    benzonatate (TESSALON) 100 MG capsule, Take 1-2 capsules (100-200 mg total) by mouth 3 (three) times daily as needed., Disp: 40 capsule, Rfl: 0   calcitRIOL (ROCALTROL) 0.25 MCG capsule, Take 0.25 mcg by mouth daily., Disp: , Rfl:    carvedilol (COREG) 12.5 MG tablet, Take 1 tablet (12.5 mg total) by mouth 2 (two) times daily with a meal., Disp: 180 tablet, Rfl: 1  dapagliflozin propanediol (FARXIGA) 5 MG TABS tablet, Take 1 tablet (5 mg total) by mouth daily before breakfast., Disp: 90 tablet, Rfl: 1   febuxostat (ULORIC) 40 MG tablet, TAKE 1 TABLET BY MOUTH ONCE DAILY FOR  GOUT  (TAKE  IN  THE  PLACE  OF  ALLOPURINOL), Disp: 90 tablet, Rfl: 1   Fluticasone-Umeclidin-Vilant (TRELEGY ELLIPTA) 100-62.5-25 MCG/ACT AEPB, Inhale 1 puff into the lungs daily., Disp: 1 each, Rfl: 1   glipiZIDE (GLUCOTROL XL) 2.5 MG 24 hr tablet, Take 1 tablet (2.5 mg total) by mouth daily with breakfast., Disp: 90 tablet, Rfl: 1   hydrALAZINE (APRESOLINE) 25 MG tablet, Take by mouth 3 (three) times daily., Disp: , Rfl:    rosuvastatin (CRESTOR) 40 MG tablet, Take 1 tablet (40 mg total) by mouth daily., Disp: 90 tablet, Rfl: 1   Vitamin D, Ergocalciferol, (DRISDOL) 1.25 MG (50000 UNIT) CAPS capsule, Take 1 capsule (50,000 Units total) by mouth once a week., Disp: 12 capsule, Rfl: 1  No Known Allergies  I personally reviewed active problem list, medication list, allergies, family history, social history, health maintenance with the patient/caregiver today.   ROS  Ten systems reviewed and is negative except as mentioned in HPI   Objective  Vitals:   03/07/22 1352  BP: 124/76  Pulse: 78  Resp: 16   Temp: 98.5 F (36.9 C)  TempSrc: Oral  SpO2: 98%  Weight: 202 lb 8 oz (91.9 kg)  Height: '5\' 7"'$  (1.702 m)    Body mass index is 31.72 kg/m.  Physical Exam  Constitutional: Patient appears well-developed and well-nourished. Obese  No distress.  HEENT: head atraumatic, normocephalic, pupils equal and reactive to light, neck supple Cardiovascular: Normal rate, regular rhythm and normal heart sounds.  No murmur heard. No BLE edema. Pulmonary/Chest: Effort normal and breath sounds normal. No respiratory distress. Abdominal: Soft.  There is no tenderness. Psychiatric: Patient has a normal mood and affect. behavior is normal. Judgment and thought content normal.   Recent Results (from the past 2160 hour(s))  Urine Culture     Status: Abnormal   Collection Time: 12/16/21  3:27 PM   Specimen: Urine   UR  Result Value Ref Range   Urine Culture, Routine Final report (A)    Organism ID, Bacteria Escherichia coli (A)     Comment: Cefazolin <=4 ug/mL Cefazolin with an MIC <=16 predicts susceptibility to the oral agents cefaclor, cefdinir, cefpodoxime, cefprozil, cefuroxime, cephalexin, and loracarbef when used for therapy of uncomplicated urinary tract infections due to E. coli, Klebsiella pneumoniae, and Proteus mirabilis. Greater than 100,000 colony forming units per mL    ORGANISM ID, BACTERIA Comment     Comment: Mixed urogenital flora 10,000-25,000 colony forming units per mL    Antimicrobial Susceptibility Comment     Comment:       ** S = Susceptible; I = Intermediate; R = Resistant **                    P = Positive; N = Negative             MICS are expressed in micrograms per mL    Antibiotic                 RSLT#1    RSLT#2    RSLT#3    RSLT#4 Amoxicillin/Clavulanic Acid    S Ampicillin  S Cefepime                       S Ceftriaxone                    S Cefuroxime                     S Ciprofloxacin                  S Ertapenem                       S Gentamicin                     S Imipenem                       S Levofloxacin                   S Meropenem                      S Nitrofurantoin                 S Piperacillin/Tazobactam        S Tetracycline                   S Tobramycin                     S Trimethoprim/Sulfa             S   POCT Urinalysis Dipstick     Status: Abnormal   Collection Time: 12/16/21  3:30 PM  Result Value Ref Range   Color, UA     Clarity, UA     Glucose, UA Positive (A) Negative   Bilirubin, UA Negative    Ketones, UA Negative    Spec Grav, UA 1.020 1.010 - 1.025   Blood, UA Negative    pH, UA 6.0 5.0 - 8.0   Protein, UA Positive (A) Negative    Comment: Large   Urobilinogen, UA 0.2 0.2 or 1.0 E.U./dL   Nitrite, UA Negative    Leukocytes, UA Moderate (2+) (A) Negative   Appearance     Odor    Ovarian Malignancy Risk-ROMA     Status: Abnormal   Collection Time: 12/16/21  4:11 PM  Result Value Ref Range   Cancer Antigen (CA) 125 10.2 0.0 - 38.1 U/mL    Comment: Roche [employer] Electrochemiluminescence Immunoassay (ECLIA) Values obtained with different assay methods or kits cannot be used interchangeably.  Results cannot be interpreted as absolute evidence of the presence or absence of malignant disease.    HE4 317.0 (H) 0.0 - 96.5 pmol/L    Comment: Roche [employer]ostics Electrochemiluminescence Immunoassay (ECLIA) Values obtained with different assay methods or kits cannot be used interchangeably.  Results cannot be interpreted as absolute evidence of the presence or absence of malignant disease.    Premenopausal ROMA 8.64 (H) See below   Postmenopausal ROMA 4.01 (H) See below   Comment Comment     Comment: The Risk for Ovarian Malignancy Algorithm (ROMA) test is intended to aid in assessing whether a premenopausal or postmenopausal woman who presents with an ovarian adnexal mass is at high or low likelihood of finding malignancy on surgery. ROMA is indicated for women who meet  the following criteria: over  age 96; ovarian adnexal mass present for which surgery is planned, and not yet referred to an oncologist. ROMA must be interpreted in conjunction with an independent clinical and radiological assessment. The test is not intended as a screening or stand-alone diagnostic assay. HE4 and CA125 values obtained with different assay methods or kits cannot be used interchangeably.   Premenopausal Interp: HIGH     Status: None   Collection Time: 12/16/21  4:11 PM  Result Value Ref Range   Premenopausal Interp: HIGH Comment     Comment: If the patient is premenopausal, then the premenopausal ROMA score of greater than or equal to 1.14 is consistent with a high likelihood of finding a malignancy on surgery.   Postmenopausal Interp: HIGH     Status: None   Collection Time: 12/16/21  4:11 PM  Result Value Ref Range   Postmenopausal Interp: HIGH Comment     Comment: If the patient is postmenopausal, then the postmenopausal ROMA score of greater than or equal to 2.99 is consistent with a high likelihood of finding a malignancy on surgery.   POCT HgB A1C     Status: Abnormal   Collection Time: 03/07/22  1:55 PM  Result Value Ref Range   Hemoglobin A1C 6.3 (A) 4.0 - 5.6 %   HbA1c POC (<> result, manual entry)     HbA1c, POC (prediabetic range)     HbA1c, POC (controlled diabetic range)      Diabetic Foot Exam: Diabetic Foot Exam - Simple   Simple Foot Form Visual Inspection See comments: Yes Sensation Testing Intact to touch and monofilament testing bilaterally: Yes Pulse Check Posterior Tibialis and Dorsalis pulse intact bilaterally: Yes Comments Thick toenails       PHQ2/9:    03/07/2022    1:54 PM 11/29/2021    8:00 AM 11/05/2021    1:27 PM 07/06/2021    3:01 PM 04/29/2021    4:05 PM  Depression screen PHQ 2/9  Decreased Interest 0 0 0 0 0  Down, Depressed, Hopeless 0 0 0 0 0  PHQ - 2 Score 0 0 0 0 0  Altered sleeping 0 0 0 0 0  Tired, decreased  energy 0 0 0 0 0  Change in appetite 0 0 0 0 0  Feeling bad or failure about yourself  0 0 0 0 0  Trouble concentrating 0 0 0 0 0  Moving slowly or fidgety/restless 0 0 0 0 0  Suicidal thoughts 0 0 0 0 0  PHQ-9 Score 0 0 0 0 0  Difficult doing work/chores     Not difficult at all    phq 9 is negative   Fall Risk:    03/07/2022    1:54 PM 12/16/2021    3:00 PM 11/29/2021    8:00 AM 11/05/2021    1:27 PM 07/06/2021    3:01 PM  Fall Risk   Falls in the past year? 1 1 0 0 0  Number falls in past yr: 0 0 0 0 0  Injury with Fall? 0 0 0 0 0  Risk for fall due to : No Fall Risks No Fall Risks No Fall Risks No Fall Risks No Fall Risks  Follow up Falls prevention discussed;Education provided;Falls evaluation completed Falls evaluation completed Falls prevention discussed Falls prevention discussed Falls prevention discussed      Functional Status Survey: Is the patient deaf or have difficulty hearing?: No Does the patient have difficulty seeing, even when wearing glasses/contacts?: No Does  the patient have difficulty concentrating, remembering, or making decisions?: No Does the patient have difficulty walking or climbing stairs?: No Does the patient have difficulty dressing or bathing?: No Does the patient have difficulty doing errands alone such as visiting a doctor's office or shopping?: No    Assessment & Plan  1. Hypertension associated with type 2 diabetes mellitus ([place])  - POCT HgB A1C - HM Diabetes Foot Exam  2. Thoracic aortic atherosclerosis (HCC)  - Lipid Profile - Hepatic function panel  3. Chronic kidney disease, stage 4 (severe) (HCC)  On transplant list   4. Hyperparathyroidism due to renal insufficiency (HCC)  Stable   5. Anemia in stage 4 chronic kidney disease ([place])  Monitored by nephrologist  6. Controlled gout  Monitored by nephrologist   7. Post-viral cough syndrome  - benzonatate (TESSALON) 100 MG capsule; Take 1-2 capsules (100-200 mg  total) by mouth 3 (three) times daily as needed.  Dispense: 40 capsule; Refill: 0  8. Dysuria  Urine culture   9. B12 deficiency  - B12 and Folate Panel  10. Dyslipidemia  On statin therapy   11. Vitamin D deficiency  - VITAMIN D 25 Hydroxy (Vit-D Deficiency, Fractures)  12. Ovarian cyst, left  She will follow up with Dr. Marcelline Mates  13. Mild persistent reactive airway disease without complication  - Fluticasone-Umeclidin-Vilant (TRELEGY ELLIPTA) 100-62.5-25 MCG/ACT AEPB; Inhale 1 puff into the lungs daily.  Dispense: 1 each; Refill: 1

## 2022-03-07 ENCOUNTER — Ambulatory Visit: Payer: Commercial Managed Care - PPO | Admitting: Family Medicine

## 2022-03-07 ENCOUNTER — Encounter: Payer: Self-pay | Admitting: Family Medicine

## 2022-03-07 VITALS — BP 124/76 | HR 78 | Temp 98.5°F | Resp 16 | Ht 67.0 in | Wt 202.5 lb

## 2022-03-07 DIAGNOSIS — I700 Atherosclerosis of aorta: Secondary | ICD-10-CM | POA: Diagnosis not present

## 2022-03-07 DIAGNOSIS — D631 Anemia in chronic kidney disease: Secondary | ICD-10-CM

## 2022-03-07 DIAGNOSIS — I152 Hypertension secondary to endocrine disorders: Secondary | ICD-10-CM

## 2022-03-07 DIAGNOSIS — M109 Gout, unspecified: Secondary | ICD-10-CM

## 2022-03-07 DIAGNOSIS — N83202 Unspecified ovarian cyst, left side: Secondary | ICD-10-CM

## 2022-03-07 DIAGNOSIS — E538 Deficiency of other specified B group vitamins: Secondary | ICD-10-CM

## 2022-03-07 DIAGNOSIS — R058 Other specified cough: Secondary | ICD-10-CM

## 2022-03-07 DIAGNOSIS — J4530 Mild persistent asthma, uncomplicated: Secondary | ICD-10-CM

## 2022-03-07 DIAGNOSIS — E1159 Type 2 diabetes mellitus with other circulatory complications: Secondary | ICD-10-CM | POA: Diagnosis not present

## 2022-03-07 DIAGNOSIS — N184 Chronic kidney disease, stage 4 (severe): Secondary | ICD-10-CM | POA: Diagnosis not present

## 2022-03-07 DIAGNOSIS — N2581 Secondary hyperparathyroidism of renal origin: Secondary | ICD-10-CM

## 2022-03-07 DIAGNOSIS — E559 Vitamin D deficiency, unspecified: Secondary | ICD-10-CM

## 2022-03-07 DIAGNOSIS — E785 Hyperlipidemia, unspecified: Secondary | ICD-10-CM

## 2022-03-07 DIAGNOSIS — R8290 Unspecified abnormal findings in urine: Secondary | ICD-10-CM

## 2022-03-07 LAB — POCT GLYCOSYLATED HEMOGLOBIN (HGB A1C): Hemoglobin A1C: 6.3 % — AB (ref 4.0–5.6)

## 2022-03-07 MED ORDER — BENZONATATE 100 MG PO CAPS: 100.0000 mg | ORAL_CAPSULE | Freq: Three times a day (TID) | ORAL | 0 refills | Status: DC | PRN

## 2022-03-07 MED ORDER — TRELEGY ELLIPTA 100-62.5-25 MCG/ACT IN AEPB: 1.0000 | INHALATION_SPRAY | Freq: Every day | RESPIRATORY_TRACT | 1 refills | Status: DC

## 2022-03-07 NOTE — Addendum Note (Signed)
Addended by: Steele Sizer F on: 03/07/2022 02:20 PM   Modules accepted: Orders

## 2022-03-08 ENCOUNTER — Telehealth: Payer: Self-pay | Admitting: Family Medicine

## 2022-03-08 LAB — HEPATIC FUNCTION PANEL
AG Ratio: 1.1 (calc) (ref 1.0–2.5)
ALT: 10 U/L (ref 6–29)
AST: 10 U/L (ref 10–35)
Albumin: 4 g/dL (ref 3.6–5.1)
Alkaline phosphatase (APISO): 63 U/L (ref 37–153)
Bilirubin, Direct: 0.1 mg/dL (ref 0.0–0.2)
Globulin: 3.6 g/dL (calc) (ref 1.9–3.7)
Indirect Bilirubin: 0.2 mg/dL (calc) (ref 0.2–1.2)
Total Bilirubin: 0.3 mg/dL (ref 0.2–1.2)
Total Protein: 7.6 g/dL (ref 6.1–8.1)

## 2022-03-08 LAB — LIPID PANEL
Cholesterol: 126 mg/dL (ref <200)
HDL: 43 mg/dL — ABNORMAL LOW (ref >=50)
LDL Cholesterol (Calc): 60 mg/dL (calc)
Non-HDL Cholesterol (Calc): 83 mg/dL (calc) (ref <130)
Total CHOL/HDL Ratio: 2.9 (calc) (ref <5.0)
Triglycerides: 144 mg/dL (ref <150)

## 2022-03-08 LAB — VITAMIN D 25 HYDROXY (VIT D DEFICIENCY, FRACTURES): Vit D, 25-Hydroxy: 101 ng/mL — ABNORMAL HIGH (ref 30–100)

## 2022-03-08 LAB — B12 AND FOLATE PANEL
Folate: 14.2 ng/mL
Vitamin B-12: 562 pg/mL (ref 200–1100)

## 2022-03-08 NOTE — Telephone Encounter (Signed)
Patient called, left VM to return the call to the office for lab results.    Steele Sizer, MD 03/08/2022 11:43 AM EST     Lipid panel has improved - continue medications Vitamin D is high and she needs to cut dow on supplementation - skip two weeks of rx vitamin D and after that take one every other week Other labs normal

## 2022-03-08 NOTE — Telephone Encounter (Signed)
Copied from [place] [id number]. Topic: Quick Communication - Lab Results (Clinic Use ONLY) >> Mar 08, 2022  2:52 PM Larene Beach B wrote: Called patient to inform them of lab results. When patient returns call, triage nurse may disclose results. >> Mar 08, 2022  3:09 PM Cyndi Bender wrote: Pt returned call for lab results. Pt requests call back at [phone]

## 2022-03-09 ENCOUNTER — Other Ambulatory Visit: Payer: Self-pay | Admitting: Family Medicine

## 2022-03-09 DIAGNOSIS — N3000 Acute cystitis without hematuria: Secondary | ICD-10-CM

## 2022-03-09 LAB — CULTURE, URINE COMPREHENSIVE
MICRO NUMBER:: 14460336
SPECIMEN QUALITY:: ADEQUATE

## 2022-03-09 MED ORDER — AMOXICILLIN-POT CLAVULANATE 875-125 MG PO TABS: 1.0000 | ORAL_TABLET | Freq: Two times a day (BID) | ORAL | 0 refills | Status: DC

## 2022-03-09 NOTE — Telephone Encounter (Signed)
Called and left voicemail letting patient know she has a UTI and we're waiting for the sensitivity to come back so we know which antibiotic to send in for her. Left callback number.

## 2022-03-09 NOTE — Telephone Encounter (Signed)
Patient notified of lab results and provider recommendations: Lipid panel has improved - continue medications Vitamin D is high and she needs to cut dow on supplementation - skip two weeks of rx vitamin D and after that take one every other week Other labs normal  Patient asked about her urine culture results- advised will send message regarding concern- still having symptoms- urine cloudy not clear.

## 2022-04-06 ENCOUNTER — Telehealth: Payer: Self-pay | Admitting: Family Medicine

## 2022-04-06 ENCOUNTER — Other Ambulatory Visit: Payer: Self-pay

## 2022-04-06 DIAGNOSIS — E1159 Type 2 diabetes mellitus with other circulatory complications: Secondary | ICD-10-CM

## 2022-04-06 MED ORDER — DAPAGLIFLOZIN PROPANEDIOL 5 MG PO TABS: 5.0000 mg | ORAL_TABLET | Freq: Every day | ORAL | 1 refills | Status: DC

## 2022-04-06 NOTE — Telephone Encounter (Signed)
Pt is completely out of dapagliflozin propanediol (FARXIGA) 5 MG TABS tablet   And the pharmacy advised that the pt needs PA for the refills / please advise asap and update pt

## 2022-04-18 ENCOUNTER — Other Ambulatory Visit: Payer: Self-pay | Admitting: Family Medicine

## 2022-04-18 ENCOUNTER — Telehealth: Payer: Self-pay

## 2022-04-18 NOTE — Telephone Encounter (Signed)
Patient came in to inform you insurance denied Wilder Glade and is requesting an alternative. Aware we will most likely have to do another PA and wait for determination for whichever medication you prescribe.

## 2022-04-18 NOTE — Telephone Encounter (Signed)
Called with no answer. Left detailed voicemail.

## 2022-05-06 NOTE — Progress Notes (Signed)
GYNECOLOGY PROGRESS NOTE  Subjective:    Patient ID: Carrera Pengelly, female    DOB: 04/20/1955, 67 y.o.   MRN: [id number]  HPI  Patient is a 67 y.o. [id number] female who presents for evaluation of recurrent urinary tract infections and to discuss cyst removal. She would like to move forward with having cyst removed, in case she gets a kidney, she does not want this cyst to interfere with her getting a kidney transplant. She has discussed this with her Nephrologist and he has no concerns pre or postoperatively.  She was taking [place] for her diabetes and her nephrologist discontinued it, as he thought it was causing her recurrent uti's. She was not put back on any medication for her diabetes. Her last A1c was 6.3. Is unclear of whom she should be following up with for further management of her diabetes ( PCP vs Nephrologist) to see what type of medication she should be on. She denies having any urinary symptoms today, just reports that her urine is cloudy.  The following portions of the patient's history were reviewed and updated as appropriate: allergies, current medications, past family history, past medical history, past social history, past surgical history, and problem list.  Review of Systems Pertinent items noted in HPI and remainder of comprehensive ROS otherwise negative.   Objective:   Blood pressure 125/87, pulse 62, resp. rate 16, height 5\' 7"  (1.702 m), weight 209 lb 9.6 oz (95.1 kg). Body mass index is 32.83 kg/m. General appearance: alert, cooperative, and no distress Abdomen: soft, non-tender; bowel sounds normal; no masses,  no organomegaly Pelvic: deferred   Labs:  Results for orders placed or performed in visit on 05/10/22  POCT Urinalysis Dipstick  Result Value Ref Range   Color, UA     Clarity, UA     Glucose, UA Negative Negative   Bilirubin, UA Negative    Ketones, UA Negative    Spec Grav, UA 1.020 1.010 - 1.025   Blood, UA Negative    pH, UA 6.5 5.0 - 8.0    Protein, UA Positive (A) Negative   Urobilinogen, UA 0.2 0.2 or 1.0 E.U./dL   Nitrite, UA Negative    Leukocytes, UA Negative Negative   Appearance     Odor       Assessment:   1. Recurrent UTI   2. Vaginal atrophy   3. Ovarian mass, left   4. End stage kidney disease (HCC)   5. Controlled type 2 diabetes mellitus with microalbuminuria, without long-term current use of insulin (HCC)      Plan:   1. Recurrent UTI -Review of chart notes that patient has had several E. coli UTIs over the past 6 months.  UA today without any significant findings for UTI.  Only protein noted.  I discussed possible etiologies of her recurrent UTIs including her ESRD, history of diabetes, or genitourinary syndrome of menopause.   Can attempt to manage genitourinary syndrome with local estrogen therapy.  Given sample of Premarin cream, advised to utilize 3 times weekly.  Also discussed alternating between dry tissue and wet wipes. Lastly, can increase hydration and use of AZO/cranberry juice.  - POCT Urinalysis Dipstick  2. Vaginal atrophy -Vaginal atrophy overall on bothersome to patient.  Can utilize estrogen therapy locally in the vagina if symptoms become bothersome.  Otherwise will just utilize for urethral atrophy and recurrent UTIs.  3. Ovarian mass, left -Patient notes that she desires to reconsider having surgery on her left ovary as  she is worried that this may hinder her from receiving a kidney transplant once 1 becomes available.  Cyst is currently at 6 cm in size.  I discussed with patient that this may not be necessary, as the cyst has been stable for several years and testing notes that it is a noncancerous tumor.  As long as cyst remains unbothersome to patient, does not require removal at this time.  4. End stage kidney disease (HCC) -Patient currently awaiting donor, on transplant list.  5. Controlled type 2 diabetes mellitus with microalbuminuria, without long-term current use of insulin  (HCC) -Last A1c was 6.3, no longer taking her Marcelline Deist due to concerns that this was the cause of her recurrent UTIs.  Will reach out to patient's PCP to see if this medication may be able to be resumed as she continues to have UTI symptoms despite being off the medication or if another medication may be better for her at this time.  RTC if symptoms fail to improve or worsen.    A total of 32 minutes were spent during this encounter, including review of previous progress notes, recent imaging and labs, face-to-face with time with patient involving counseling and coordination of care, as well as documentation for current visit.   Hildred Laser, MD [facility]

## 2022-05-10 ENCOUNTER — Encounter: Payer: Self-pay | Admitting: Obstetrics and Gynecology

## 2022-05-10 ENCOUNTER — Ambulatory Visit: Payer: Commercial Managed Care - PPO | Admitting: Obstetrics and Gynecology

## 2022-05-10 VITALS — BP 125/87 | HR 62 | Resp 16 | Ht 67.0 in | Wt 209.6 lb

## 2022-05-10 DIAGNOSIS — N838 Other noninflammatory disorders of ovary, fallopian tube and broad ligament: Secondary | ICD-10-CM | POA: Diagnosis not present

## 2022-05-10 DIAGNOSIS — N952 Postmenopausal atrophic vaginitis: Secondary | ICD-10-CM | POA: Diagnosis not present

## 2022-05-10 DIAGNOSIS — N390 Urinary tract infection, site not specified: Secondary | ICD-10-CM

## 2022-05-10 DIAGNOSIS — E1122 Type 2 diabetes mellitus with diabetic chronic kidney disease: Secondary | ICD-10-CM

## 2022-05-10 DIAGNOSIS — Z992 Dependence on renal dialysis: Secondary | ICD-10-CM

## 2022-05-10 DIAGNOSIS — N186 End stage renal disease: Secondary | ICD-10-CM

## 2022-05-10 DIAGNOSIS — R809 Proteinuria, unspecified: Secondary | ICD-10-CM

## 2022-05-10 LAB — POCT URINALYSIS DIPSTICK
Bilirubin, UA: NEGATIVE
Blood, UA: NEGATIVE
Glucose, UA: NEGATIVE
Ketones, UA: NEGATIVE
Leukocytes, UA: NEGATIVE
Nitrite, UA: NEGATIVE
Protein, UA: POSITIVE — AB
Spec Grav, UA: 1.02 (ref 1.010–1.025)
Urobilinogen, UA: 0.2 E.U./dL
pH, UA: 6.5 (ref 5.0–8.0)

## 2022-05-10 MED ORDER — PREMARIN 0.625 MG/GM VA CREA: TOPICAL_CREAM | VAGINAL | 3 refills | Status: DC

## 2022-05-16 NOTE — Telephone Encounter (Addendum)
Pt returned called back about alternative for [place] and message that was left from [place] on 03/04. Please call back

## 2022-05-16 NOTE — Telephone Encounter (Signed)
Spoke to patient clarified instructions per Dr. Ancil Boozer

## 2022-05-25 ENCOUNTER — Other Ambulatory Visit: Payer: Self-pay | Admitting: Family Medicine

## 2022-05-25 DIAGNOSIS — R809 Proteinuria, unspecified: Secondary | ICD-10-CM

## 2022-06-03 NOTE — Progress Notes (Unsigned)
Name: Cheryl Barr   MRN: [id number]    DOB: Sep 12, 1955   Date:06/06/2022       Progress Note  Subjective  Chief Complaint  Follow Up  HPI  HTN: compliant with medication, Azor, carvedilol Dr. Thedore Mins also added Hydralazine Spring  2023. BP is at goal today.  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 Thedore Mins,  kidney biopsy showed glomerulonephritis She denies polyphagia, but has noticed polydipsia and nocturia .  She is off [place], duet to cost, we gave her glipizide 2.5 mg and A1C today is 5.9 % and she has noticed some shaking spells and dizziness  at work that improves with orange juice , she also takes statin therapy for dyslipidemia   CKI stage IV: on kidney transplant list at [facility], she has secondary hyperparathyroidism and anemia of chronic disease. She is compliant with medications and office visits    Gout: . she is on Uloric now, and under control , continue supplementation    Vitamin B12 deficiency: she is not  taking otc supplementation.  Vitamin D deficiency: taking vitamin D rx weekly , continue supplementation    Chronic kidney disease: she is seeing Dr. Thedore Mins, now is CKI stage V  has secondary hyperparathyroidism , renal biopsy showed segmental glomerulonephritis . No pruritis, normal urine output  Dr. Thedore Mins prescribed her  low dose Marcelline Deist for kidney protection , she is now going to [facility] and is on the transplant list   Left ovarian cyst: incidental finding during evaluation for kidney transplant, she was seen by Dr. Valentino Saxon and given reassurance, however transplant team advised to have it removed surgically, she saw Dr. Valentino Saxon again but [place] was not ordered, I will place an order today   Pelvic US 10/2021  1.Somewhat limited exam in  absence of IV contrast.  2.O-RADS MRI 3: Redemonstration of a multilocular cystic lesion arising from the left ovary, which measures up to 7.7 cm in greatest axial dimension (previously 7.9 cm). This lesion demonstrates several thin internal septations without obvious internal soft tissue nodularity.  3.Note is also made of a small hemorrhagic cyst which arises from the right ovary, measuring up to 1.8 cm in greatest axial dimension.  4.Multi-fibroid uterus (as detailed within the body the report).  5.Other chronic or incidental findings, as described within the body of the report.   Right hand numbness: he wakes up with numbness right hand when sleeping on right arm, shakes her hand and improves, discussed possible carpal tunnel Symptoms are stable and does not want NCS or EMG at this time   Patient Active Problem List   Diagnosis Date Noted   Meralgia paresthetica of right side 11/05/2021   Controlled type 2 diabetes mellitus with microalbuminuria, without long-term current use of insulin 11/05/2021   History of colonic polyps    Polyp of transverse colon    End stage kidney disease 03/08/2021   Reactive airway disease 05/20/2020   Hypertension associated with type 2 diabetes mellitus 05/20/2020   Hyperparathyroidism due to renal insufficiency 06/12/2019   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   Anemia in chronic kidney disease 06/12/2019   History of late syphilis 11/26/2018   Incomplete rotator cuff tear or rupture of left shoulder, not  specified as traumatic 07/26/2016   Thoracic aortic atherosclerosis 01/12/2016   DDD (degenerative disc disease), 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 Lemar Livings   BREAST SURGERY Left 2015   COLONOSCOPY  2009   1 polyp found. Dr. Smith Mince   COLONOSCOPY WITH PROPOFOL N/A 05/04/2021   Procedure: COLONOSCOPY WITH PROPOFOL;  Surgeon: Midge Minium, MD;  Location: [facility] ENDOSCOPY;  Service: Endoscopy;  Laterality: N/A;   HERNIA REPAIR  10/09/2014   Ventral/umbilical hernia repair with 10 x 10 cm Atrium mesh in preperitoneal position.   INSERTION OF MESH N/A 10/09/2014   Procedure: INSERTION OF MESH;  Surgeon: Earline Mayotte, MD;  Location: [facility] ORS;  Service: General;  Laterality: N/A;   KNEE SURGERY Left 01/14/2009   STRABISMUS SURGERY     Repair as a child   TUBAL LIGATION  1990   VENTRAL HERNIA REPAIR N/A 10/09/2014   Procedure: HERNIA REPAIR VENTRAL ADULT;  Surgeon: Earline Mayotte, MD;  Location: [facility] ORS;  Service: General;  Laterality: N/A;    Family History  Problem Relation Age of Onset   Hypertension Mother    Diabetes Mother    Heart attack Father    Coronary artery disease Father    Breast cancer Father 57   Diabetes Sister    Gout Brother    Heart disease Brother    Allergies Son        Arts administrator and Publishing rights manager    Social History   Tobacco Use   Smoking status: Former    Types: Cigarettes    Quit date: 10/02/1983    Years since quitting: 38.7   Smokeless tobacco: Never  Substance Use Topics   Alcohol use: No    Alcohol/week: 0.0 standard drinks of alcohol     Current Outpatient Medications:    albuterol (VENTOLIN HFA) 108 (90 Base) MCG/ACT inhaler, Inhale 2 puffs into the lungs every 6 (six) hours as needed for wheezing or shortness of breath., Disp: 18 g, Rfl: 0   aspirin 81 MG EC tablet, Take 81 mg by mouth daily., Disp: , Rfl:    calcitRIOL (ROCALTROL) 0.25 MCG capsule, Take 0.25 mcg by mouth daily., Disp: , Rfl:    conjugated  estrogens (PREMARIN) vaginal cream, Apply dime-sized amount to urethra three times weekly., Disp: 42.5 g, Rfl: 3   Fluticasone-Umeclidin-Vilant (TRELEGY ELLIPTA) 100-62.5-25 MCG/ACT AEPB, Inhale 1 puff into the lungs daily., Disp: 1 each, Rfl: 1   hydrALAZINE (APRESOLINE) 25 MG tablet, Take by mouth 3 (three) times daily., Disp: , Rfl:    pioglitazone (ACTOS) 15 MG tablet, Take 1 tablet (15 mg total) by mouth daily. In place of glipizide for Diabetes, Disp: 90 tablet, Rfl: 1   amLODipine-olmesartan (AZOR) 10-40 MG tablet, Take 1 tablet by mouth daily., Disp: 90 tablet, Rfl: 1   carvedilol (COREG) 12.5 MG tablet, Take 1 tablet (12.5 mg total) by mouth 2 (two) times daily with a meal., Disp: 180 tablet, Rfl: 1   febuxostat (ULORIC) 40 MG tablet, TAKE 1 TABLET BY MOUTH  ONCE DAILY FOR  GOUT  (TAKE  IN  THE  PLACE  OF  ALLOPURINOL), Disp: 90 tablet, Rfl: 1   rosuvastatin (CRESTOR) 40 MG tablet, Take 1 tablet (40 mg total) by mouth daily., Disp: 90 tablet, Rfl: 1   Vitamin D, Ergocalciferol, (DRISDOL) 1.25 MG (50000 UNIT) CAPS capsule, Take 1 capsule (50,000 Units total) by mouth once a week., Disp: 12 capsule, Rfl: 1  No Known Allergies  I personally reviewed active problem list, medication list, allergies, family history, social history, health maintenance with the patient/caregiver today.   ROS   Constitutional: Negative for fever or weight change.  Respiratory: Negative for cough and shortness of breath.   Cardiovascular: Negative for chest pain or palpitations.  Gastrointestinal: Negative for abdominal pain, no bowel changes.  Musculoskeletal: Negative for gait problem or joint swelling.  Skin: Negative for rash.  Neurological: Negative for dizziness or headache.  No other specific complaints in a complete review of systems (except as listed in HPI above).   Objective  Vitals:   06/06/22 1321  BP: 122/76  Pulse: 75  Resp: 16  Temp: 98 F (36.7 C)  TempSrc: Oral  SpO2: 98%   Weight: 209 lb 8 oz (95 kg)  Height:  (1.702 m)    Body mass index is 32.81 kg/m.  Physical Exam  Constitutional: Patient appears well-developed and well-nourished. Obese  No distress.  HEENT: head atraumatic, normocephalic, pupils equal and reactive to light, neck supple Cardiovascular: Normal rate, regular rhythm and normal heart sounds.  No murmur heard. No BLE edema. Pulmonary/Chest: Effort normal and breath sounds normal. No respiratory distress. Abdominal: Soft.  There is no tenderness. Psychiatric: Patient has a normal mood and affect. behavior is normal. Judgment and thought content normal.    PHQ2/9:    06/06/2022    1:20 PM 03/07/2022    1:54 PM 11/29/2021    8:00 AM 11/05/2021    1:27 PM 07/06/2021    3:01 PM  Depression screen PHQ 2/9  Decreased Interest 0 0 0 0 0  Down, Depressed, Hopeless 0 0 0 0 0  PHQ - 2 Score 0 0 0 0 0  Altered sleeping 0 0 0 0 0  Tired, decreased energy 0 0 0 0 0  Change in appetite 0 0 0 0 0  Feeling bad or failure about yourself  0 0 0 0 0  Trouble concentrating 0 0 0 0 0  Moving slowly or fidgety/restless 0 0 0 0 0  Suicidal thoughts 0 0 0 0 0  PHQ-9 Score 0 0 0 0 0    phq 9 is negative   Fall Risk:    06/06/2022    1:20 PM 03/07/2022    1:54 PM 12/16/2021    3:00 PM 11/29/2021    8:00 AM 11/05/2021    1:27 PM  Fall Risk   Falls in the past year? 0 1 1 0 0  Number falls in past yr:  0 0 0 0  Injury with Fall?  0 0 0 0  Risk for fall due to : No Fall Risks No Fall Risks No Fall Risks No Fall Risks No Fall Risks  Follow up Falls prevention discussed Falls prevention discussed;Education provided;Falls evaluation completed Falls evaluation completed Falls prevention discussed Falls prevention discussed      Functional Status Survey: Is the patient deaf or have difficulty hearing?: No Does the patient have difficulty seeing, even when wearing glasses/contacts?: No Does the patient have difficulty concentrating,  remembering, or making decisions?: No Does the patient have difficulty walking or climbing stairs?: No Does the patient have difficulty dressing or bathing?: No Does the patient have difficulty doing errands alone such as visiting a doctor's office or shopping?: No    Assessment & Plan  1. Hypertension associated with type 2 diabetes mellitus  - POCT HgB A1C - amLODipine-olmesartan (AZOR) 10-40 MG tablet; Take 1 tablet by mouth daily.  Dispense: 90 tablet; Refill: 1 - carvedilol (COREG) 12.5 MG tablet; Take 1 tablet (12.5 mg total) by mouth 2 (two) times daily with a meal.  Dispense: 180 tablet; Refill: 1 - pioglitazone (ACTOS) 15 MG tablet; Take 1 tablet (15 mg total) by mouth daily. In place of glipizide for Diabetes  Dispense: 90 tablet; Refill: 1  2. Anemia in stage 4 chronic kidney disease  Stable  3. Controlled gout  - febuxostat (ULORIC) 40 MG tablet; TAKE 1 TABLET BY MOUTH ONCE DAILY FOR  GOUT  (TAKE  IN  THE  PLACE  OF  ALLOPURINOL)  Dispense: 90 tablet; Refill: 1  4. Hyperparathyroidism due to renal insufficiency  Monitored by Dr. Thedore Mins   5. Controlled type 2 diabetes mellitus with microalbuminuria, without long-term current use of insulin  We will stop glizide due to hypoglycemic episodes   6. Dyslipidemia  - rosuvastatin (CRESTOR) 40 MG tablet; Take 1 tablet (40 mg total) by mouth daily.  Dispense: 90 tablet; Refill: 1  7. Thoracic aortic atherosclerosis  - rosuvastatin (CRESTOR) 40 MG tablet; Take 1 tablet (40 mg total) by mouth daily.  Dispense: 90 tablet; Refill: 1  8. Vitamin D deficiency  - Vitamin D, Ergocalciferol, (DRISDOL) 1.25 MG (50000 UNIT) CAPS capsule; Take 1 capsule (50,000 Units total) by mouth once a week.  Dispense: 12 capsule; Refill: 1  9. Mild persistent reactive airway disease without complication   10. B12 deficiency  Take otc supplementation   11. Chronic kidney disease, stage 4 (severe)   Under the care of nephrologist

## 2022-06-06 ENCOUNTER — Ambulatory Visit: Payer: Commercial Managed Care - PPO | Admitting: Family Medicine

## 2022-06-06 ENCOUNTER — Encounter: Payer: Self-pay | Admitting: Family Medicine

## 2022-06-06 VITALS — BP 122/76 | HR 75 | Temp 98.0°F | Resp 16 | Ht 67.0 in | Wt 209.5 lb

## 2022-06-06 DIAGNOSIS — N83201 Unspecified ovarian cyst, right side: Secondary | ICD-10-CM

## 2022-06-06 DIAGNOSIS — N051 Unspecified nephritic syndrome with focal and segmental glomerular lesions: Secondary | ICD-10-CM

## 2022-06-06 DIAGNOSIS — N2581 Secondary hyperparathyroidism of renal origin: Secondary | ICD-10-CM | POA: Diagnosis not present

## 2022-06-06 DIAGNOSIS — N184 Chronic kidney disease, stage 4 (severe): Secondary | ICD-10-CM | POA: Diagnosis not present

## 2022-06-06 DIAGNOSIS — E538 Deficiency of other specified B group vitamins: Secondary | ICD-10-CM

## 2022-06-06 DIAGNOSIS — M109 Gout, unspecified: Secondary | ICD-10-CM | POA: Diagnosis not present

## 2022-06-06 DIAGNOSIS — J4530 Mild persistent asthma, uncomplicated: Secondary | ICD-10-CM

## 2022-06-06 DIAGNOSIS — R809 Proteinuria, unspecified: Secondary | ICD-10-CM

## 2022-06-06 DIAGNOSIS — E1159 Type 2 diabetes mellitus with other circulatory complications: Secondary | ICD-10-CM

## 2022-06-06 DIAGNOSIS — D631 Anemia in chronic kidney disease: Secondary | ICD-10-CM

## 2022-06-06 DIAGNOSIS — E1129 Type 2 diabetes mellitus with other diabetic kidney complication: Secondary | ICD-10-CM

## 2022-06-06 DIAGNOSIS — I700 Atherosclerosis of aorta: Secondary | ICD-10-CM

## 2022-06-06 DIAGNOSIS — E559 Vitamin D deficiency, unspecified: Secondary | ICD-10-CM

## 2022-06-06 DIAGNOSIS — E785 Hyperlipidemia, unspecified: Secondary | ICD-10-CM

## 2022-06-06 DIAGNOSIS — I152 Hypertension secondary to endocrine disorders: Secondary | ICD-10-CM

## 2022-06-06 LAB — POCT GLYCOSYLATED HEMOGLOBIN (HGB A1C): Hemoglobin A1C: 5.9 % — AB (ref 4.0–5.6)

## 2022-06-06 MED ORDER — ROSUVASTATIN CALCIUM 40 MG PO TABS: 40.0000 mg | ORAL_TABLET | Freq: Every day | ORAL | 1 refills | Status: DC

## 2022-06-06 MED ORDER — CARVEDILOL 12.5 MG PO TABS: 12.5000 mg | ORAL_TABLET | Freq: Two times a day (BID) | ORAL | 1 refills | Status: DC

## 2022-06-06 MED ORDER — AMLODIPINE-OLMESARTAN 10-40 MG PO TABS: 1.0000 | ORAL_TABLET | Freq: Every day | ORAL | 1 refills | Status: DC

## 2022-06-06 MED ORDER — PIOGLITAZONE HCL 15 MG PO TABS: 15.0000 mg | ORAL_TABLET | Freq: Every day | ORAL | 1 refills | Status: DC

## 2022-06-06 MED ORDER — FEBUXOSTAT 40 MG PO TABS: ORAL_TABLET | ORAL | 1 refills | Status: DC

## 2022-06-06 MED ORDER — VITAMIN D (ERGOCALCIFEROL) 1.25 MG (50000 UNIT) PO CAPS: 50000.0000 [IU] | ORAL_CAPSULE | ORAL | 1 refills | Status: DC

## 2022-06-13 ENCOUNTER — Other Ambulatory Visit: Payer: Self-pay | Admitting: Family Medicine

## 2022-06-13 DIAGNOSIS — Z1231 Encounter for screening mammogram for malignant neoplasm of breast: Secondary | ICD-10-CM

## 2022-06-23 ENCOUNTER — Ambulatory Visit
Admission: RE | Admit: 2022-06-23 | Discharge: 2022-06-23 | Disposition: A | Discharge status: Home / Self Care | Payer: Commercial Managed Care - PPO | Source: Ambulatory Visit | Attending: Family Medicine | Admitting: Family Medicine

## 2022-06-23 DIAGNOSIS — Z1231 Encounter for screening mammogram for malignant neoplasm of breast: Secondary | ICD-10-CM | POA: Insufficient documentation

## 2022-10-06 NOTE — Progress Notes (Signed)
Name: Cheryl Barr   MRN: [id number]    DOB: 1955-08-25   Date:10/07/2022       Progress Note  Subjective  Chief Complaint  Follow up  HPI  HTN: compliant with medication, Azor, carvedilol Dr. Thedore Mins also added Hydralazine Spring  2023. BP is at goal today.  She denies chest pain, palpitation, dizziness    Hyperlipidemia/Atherosclerosis of aorta : last LDL done 02/2022 was at goal at 60,  continue Rosuvastatin 40mg  , denies side effects  Continue medication    DMII with CKI stage V and also HTN: urine micro has been high and GFR low, under the care of Dr. Thedore Mins,  kidney biopsy showed glomerulonephritis She denies polyphagia, but has noticed polydipsia and nocturia .  She is off [place], duet to cost, we gave her glipizide 2.5 mg  but she was having hypoglycemic episodes. She is now only on Actos 15 mg and doing well, A1C still at goal at 5.9 %    Gout:  she is on Uloric now, and under control , continue supplementation , uric acid level was 3.9 - August 2024    Vitamin B12 deficiency: she is not taking otc supplementation.   Vitamin D deficiency: taking vitamin D rx weekly , continue supplementation    Chronic kidney disease stage V : she is seeing Dr. Ladon Applebaum secondary hyperparathyroidism , renal biopsy showed segmental glomerulonephritis . No pruritis, normal urine output  Dr. Thedore Mins prescribed her low dose Marcelline Deist for kidney protection but no longer on medication. She is on [facility] transplant list . She is feeling discouraged and afraid to have fistula placed, but explained importance of having it done. GFR has been between 13-18 for about one year   Left ovarian cyst: incidental finding during evaluation for kidney transplant, she was seen by Dr. Valentino Saxon and given reassurance, however transplant team advised to have it removed surgically, she saw Dr. Valentino Saxon again but [place] was not ordered, I placed an order in April but it was not done. I will ask referral coordinator about it   Pelvic US  10/2021  1.Somewhat limited exam in absence of IV contrast.  2.O-RADS MRI 3: Redemonstration of a multilocular cystic lesion arising from the left ovary, which measures up to 7.7 cm in greatest axial dimension (previously 7.9 cm). This lesion demonstrates several thin internal septations without obvious internal soft tissue nodularity.  3.Note is also made of a small hemorrhagic cyst which arises from the right ovary, measuring up to 1.8 cm in greatest axial dimension.  4.Multi-fibroid uterus (as detailed within the body the report).  5.Other chronic or incidental findings, as described within the body of the report.   Right hand numbness: he wakes up with numbness right hand when sleeping on right arm, shakes her hand and improves, discussed possible carpal tunnel Symptoms are stable and does not want NCS or EMG at this time. She states not as bad lately except that she has noticed tremors on that hand now   Dysthymia: she is upset about her medical problems, she has crying spells, she is able to fall asleep but gets up multiple times to void and has difficulty falling back asleep    Patient Active Problem List   Diagnosis Date Noted   Meralgia paresthetica of right side 11/05/2021   Controlled type 2 diabetes mellitus with microalbuminuria, without long-term current use of insulin (HCC) 11/05/2021   History of colonic polyps    Polyp of transverse colon    End stage kidney  disease (HCC) 03/08/2021   Reactive airway disease 05/20/2020   Hypertension associated with type 2 diabetes mellitus (HCC) 05/20/2020   Hyperparathyroidism due to renal insufficiency (HCC) 06/12/2019   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   Anemia in chronic kidney disease 06/12/2019   History of late syphilis 11/26/2018   Incomplete rotator cuff tear or rupture of left shoulder, not specified as traumatic 07/26/2016   Thoracic aortic atherosclerosis (HCC) 01/12/2016   DDD (degenerative disc disease),  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/2016   B12 deficiency 01/11/2016   Hernia of abdominal cavity 09/15/2014   Dyslipidemia 08/10/2014   Interval gout 08/10/2014   Microalbuminuria 08/10/2014   Osteoarthritis of left knee 08/10/2014   Vitamin D deficiency 08/10/2014   Intraductal papilloma of breast 06/27/2013   Hypertension, essential, benign 09/02/2009   ABNORMAL EKG 09/02/2009    Past Surgical History:  Procedure Laterality Date   BREAST BIOPSY Left 05/24/2013   neg   BREAST EXCISIONAL BIOPSY Left 06/21/2013   neg/ dr. Lemar Livings   BREAST SURGERY Left 2015   COLONOSCOPY  2009   1 polyp found. Dr. Smith Mince   COLONOSCOPY WITH PROPOFOL N/A 05/04/2021   Procedure: COLONOSCOPY WITH PROPOFOL;  Surgeon: Midge Minium, MD;  Location: [facility] ENDOSCOPY;  Service: Endoscopy;  Laterality: N/A;   HERNIA REPAIR  10/09/2014   Ventral/umbilical hernia repair with 10 x 10 cm Atrium mesh in preperitoneal position.   INSERTION OF MESH N/A 10/09/2014   Procedure: INSERTION OF MESH;  Surgeon: Earline Mayotte, MD;  Location: [facility] ORS;  Service: General;  Laterality: N/A;   KNEE SURGERY Left 01/14/2009   STRABISMUS SURGERY     Repair as a child   TUBAL LIGATION  1990   VENTRAL HERNIA REPAIR N/A 10/09/2014   Procedure: HERNIA REPAIR VENTRAL ADULT;  Surgeon: Earline Mayotte, MD;  Location: [facility] ORS;  Service: General;  Laterality: N/A;    Family History  Problem Relation Age of Onset   Hypertension Mother    Diabetes Mother    Heart attack Father    Coronary artery disease Father    Breast cancer Father 77   Diabetes Sister    Gout Brother    Heart disease Brother    Allergies Son        Arts administrator and Publishing rights manager    Social History   Tobacco Use   Smoking status: Former    Current packs/day: 0.00    Types: Cigarettes    Quit date: 10/02/1983    Years since quitting: 39.0   Smokeless tobacco: Never  Substance Use Topics   Alcohol use: No    Alcohol/week: 0.0 standard drinks of alcohol     Current Outpatient  Medications:    albuterol (VENTOLIN HFA) 108 (90 Base) MCG/ACT inhaler, Inhale 2 puffs into the lungs every 6 (six) hours as needed for wheezing or shortness of breath., Disp: 18 g, Rfl: 0   amLODipine-olmesartan (AZOR) 10-40 MG tablet, Take 1 tablet by mouth daily., Disp: 90 tablet, Rfl: 1   aspirin 81 MG EC tablet, Take 81 mg by mouth daily., Disp: , Rfl:    calcitRIOL (ROCALTROL) 0.25 MCG capsule, Take 0.25 mcg by mouth daily., Disp: , Rfl:    carvedilol (COREG) 12.5 MG tablet, Take 1 tablet (12.5 mg total) by mouth 2 (two) times daily with a meal., Disp: 180 tablet, Rfl: 1   conjugated estrogens (PREMARIN) vaginal cream, Apply dime-sized amount to urethra three times weekly., Disp: 42.5 g, Rfl: 3   DULoxetine (CYMBALTA) 30 MG capsule, Take  1 capsule (30 mg total) by mouth daily., Disp: 30 capsule, Rfl: 0   DULoxetine (CYMBALTA) 60 MG capsule, Take 1 capsule (60 mg total) by mouth daily., Disp: 90 capsule, Rfl: 0   febuxostat (ULORIC) 40 MG tablet, TAKE 1 TABLET BY MOUTH ONCE DAILY FOR  GOUT  (TAKE  IN  THE  PLACE  OF  ALLOPURINOL), Disp: 90 tablet, Rfl: 1   Fluticasone-Umeclidin-Vilant (TRELEGY ELLIPTA) 100-62.5-25 MCG/ACT AEPB, Inhale 1 puff into the lungs daily., Disp: 1 each, Rfl: 1   hydrALAZINE (APRESOLINE) 25 MG tablet, Take by mouth 3 (three) times daily., Disp: , Rfl:    pioglitazone (ACTOS) 15 MG tablet, Take 1 tablet (15 mg total) by mouth daily. In place of glipizide for Diabetes, Disp: 90 tablet, Rfl: 1   rosuvastatin (CRESTOR) 40 MG tablet, Take 1 tablet (40 mg total) by mouth daily., Disp: 90 tablet, Rfl: 1   Vitamin D, Ergocalciferol, (DRISDOL) 1.25 MG (50000 UNIT) CAPS capsule, Take 1 capsule (50,000 Units total) by mouth once a week., Disp: 12 capsule, Rfl: 1  No Known Allergies  I personally reviewed active problem list, medication list, allergies, family history with the patient/caregiver today.   ROS  Ten systems reviewed and is negative except as mentioned in HPI     Objective  Vitals:   10/07/22 1329  BP: 122/74  Pulse: 75  Resp: 14  Temp: 98.1 F (36.7 C)  TempSrc: Oral  SpO2: 98%  Weight: 210 lb 12.8 oz (95.6 kg)  Height: 5\' 7"  (1.702 m)    Body mass index is 33.02 kg/m.  Physical Exam  Constitutional: Patient appears well-developed and well-nourished. Obese  No distress.  HEENT: head atraumatic, normocephalic, pupils equal and reactive to light, neck supple, Cardiovascular: Normal rate, regular rhythm and normal heart sounds.  No murmur heard. No BLE edema. Pulmonary/Chest: Effort normal and breath sounds normal. No respiratory distress. Abdominal: Soft.  There is no tenderness. Psychiatric: Patient has a normal mood and affect. behavior is normal. Judgment and thought content normal.   PHQ2/9:    10/07/2022    1:31 PM 06/06/2022    1:20 PM 03/07/2022    1:54 PM 11/29/2021    8:00 AM 11/05/2021    1:27 PM  Depression screen PHQ 2/9  Decreased Interest 0 0 0 0 0  Down, Depressed, Hopeless 0 0 0 0 0  PHQ - 2 Score 0 0 0 0 0  Altered sleeping 0 0 0 0 0  Tired, decreased energy 0 0 0 0 0  Change in appetite 0 0 0 0 0  Feeling bad or failure about yourself  0 0 0 0 0  Trouble concentrating 0 0 0 0 0  Moving slowly or fidgety/restless 0 0 0 0 0  Suicidal thoughts 0 0 0 0 0  PHQ-9 Score 0 0 0 0 0    phq 9 is negative   Fall Risk:    10/07/2022    1:30 PM 06/06/2022    1:20 PM 03/07/2022    1:54 PM 12/16/2021    3:00 PM 11/29/2021    8:00 AM  Fall Risk   Falls in the past year? 0 0 1 1 0  Number falls in past yr:   0 0 0  Injury with Fall?   0 0 0  Risk for fall due to : No Fall Risks No Fall Risks No Fall Risks No Fall Risks No Fall Risks  Follow up  Falls prevention discussed Falls prevention discussed;Education  provided;Falls evaluation completed Falls evaluation completed Falls prevention discussed     Assessment & Plan  1. Controlled type 2 diabetes mellitus with microalbuminuria, without long-term current use of  insulin (HCC)  - POCT HgB A1C  2. Thoracic aortic atherosclerosis (HCC)  On statin therapy   3. Hyperparathyroidism due to renal insufficiency (HCC)  Monitored by nephrologist   4. Chronic kidney disease, stage V (HCC)  Keep visit with nephrologist   5. Focal segmental glomerulosclerosis   6. Dysthymia  - DULoxetine (CYMBALTA) 30 MG capsule; Take 1 capsule (30 mg total) by mouth daily.  Dispense: 30 capsule; Refill: 0 - DULoxetine (CYMBALTA) 60 MG capsule; Take 1 capsule (60 mg total) by mouth daily.  Dispense: 90 capsule; Refill: 0

## 2022-10-07 ENCOUNTER — Encounter: Payer: Self-pay | Admitting: Family Medicine

## 2022-10-07 ENCOUNTER — Ambulatory Visit (INDEPENDENT_AMBULATORY_CARE_PROVIDER_SITE_OTHER): Payer: Commercial Managed Care - PPO | Admitting: Family Medicine

## 2022-10-07 VITALS — BP 122/74 | HR 75 | Temp 98.1°F | Resp 14 | Ht 67.0 in | Wt 210.8 lb

## 2022-10-07 DIAGNOSIS — I700 Atherosclerosis of aorta: Secondary | ICD-10-CM | POA: Diagnosis not present

## 2022-10-07 DIAGNOSIS — N185 Chronic kidney disease, stage 5: Secondary | ICD-10-CM

## 2022-10-07 DIAGNOSIS — R809 Proteinuria, unspecified: Secondary | ICD-10-CM | POA: Diagnosis not present

## 2022-10-07 DIAGNOSIS — N2581 Secondary hyperparathyroidism of renal origin: Secondary | ICD-10-CM

## 2022-10-07 DIAGNOSIS — E1129 Type 2 diabetes mellitus with other diabetic kidney complication: Secondary | ICD-10-CM | POA: Diagnosis not present

## 2022-10-07 DIAGNOSIS — N051 Unspecified nephritic syndrome with focal and segmental glomerular lesions: Secondary | ICD-10-CM

## 2022-10-07 DIAGNOSIS — F341 Dysthymic disorder: Secondary | ICD-10-CM

## 2022-10-07 LAB — POCT GLYCOSYLATED HEMOGLOBIN (HGB A1C): Hemoglobin A1C: 5.9 % — AB (ref 4.0–5.6)

## 2022-10-07 MED ORDER — DULOXETINE HCL 60 MG PO CPEP: 60.0000 mg | ORAL_CAPSULE | Freq: Every day | ORAL | 0 refills | Status: DC

## 2022-10-07 MED ORDER — DULOXETINE HCL 30 MG PO CPEP: 30.0000 mg | ORAL_CAPSULE | Freq: Every day | ORAL | 0 refills | Status: DC

## 2022-10-19 ENCOUNTER — Ambulatory Visit
Admission: RE | Admit: 2022-10-19 | Discharge: 2022-10-19 | Disposition: A | Discharge status: Home / Self Care | Payer: Commercial Managed Care - PPO | Source: Ambulatory Visit | Attending: Family Medicine | Admitting: Family Medicine

## 2022-10-19 DIAGNOSIS — N83201 Unspecified ovarian cyst, right side: Secondary | ICD-10-CM | POA: Diagnosis present

## 2022-10-24 ENCOUNTER — Ambulatory Visit
Admission: RE | Admit: 2022-10-24 | Discharge: 2022-10-24 | Disposition: A | Discharge status: Home / Self Care | Payer: Commercial Managed Care - PPO | Source: Ambulatory Visit | Attending: Nephrology

## 2022-10-24 DIAGNOSIS — D631 Anemia in chronic kidney disease: Secondary | ICD-10-CM | POA: Diagnosis not present

## 2022-10-24 DIAGNOSIS — N189 Chronic kidney disease, unspecified: Secondary | ICD-10-CM | POA: Diagnosis present

## 2022-10-24 MED ORDER — SODIUM CHLORIDE 0.9 % IV SOLN
200.0000 mg | Freq: Once | INTRAVENOUS | Status: AC
  Administered 2022-10-24: 200 mg via INTRAVENOUS
  Filled 2022-10-24: qty 200

## 2022-11-24 ENCOUNTER — Other Ambulatory Visit: Payer: Self-pay | Admitting: Family Medicine

## 2022-11-24 DIAGNOSIS — F341 Dysthymic disorder: Secondary | ICD-10-CM

## 2022-11-25 ENCOUNTER — Other Ambulatory Visit: Payer: Self-pay

## 2022-11-25 DIAGNOSIS — F341 Dysthymic disorder: Secondary | ICD-10-CM

## 2022-11-25 MED ORDER — DULOXETINE HCL 60 MG PO CPEP
60.0000 mg | ORAL_CAPSULE | Freq: Every day | ORAL | 0 refills | Status: DC
Start: 2022-11-25 — End: 2023-02-09

## 2022-11-25 NOTE — Telephone Encounter (Signed)
Requested medications are due for refill today.  unsure  Requested medications are on the active medications list.  yes  Last refill. 10/07/2022 #30 0 rf  Future visit scheduled.   yes  Notes to clinic.  Pt has both 30mg  and 60 mg on med list.    Requested Prescriptions  Pending Prescriptions Disp Refills   DULoxetine (CYMBALTA) 30 MG capsule [Pharmacy Med Name: DULoxetine HCl 30 MG Oral Capsule Delayed Release Particles] 30 capsule 0    Sig: Take 1 capsule by mouth once daily     Psychiatry: Antidepressants - SNRI - duloxetine Failed - 11/24/2022  7:27 PM      Failed - Cr in normal range and within 360 days    Creat  Date Value Ref Range Status  11/21/2019 2.11 (H) 0.50 - 0.99 mg/dL Final    Comment:    For patients >26 years of age, the reference limit for Creatinine is approximately 13% higher for people identified as African-American. .    Creatinine, Urine  Date Value Ref Range Status  02/19/2019 96 20 - 275 mg/dL Final         Failed - eGFR is 30 or above and within 360 days    GFR, Est African American  Date Value Ref Range Status  11/21/2019 28 (L) > OR = 60 mL/min/1.43m2 Final   GFR, Est Non African American  Date Value Ref Range Status  11/21/2019 24 (L) > OR = 60 mL/min/1.75m2 Final         Passed - Completed PHQ-2 or PHQ-9 in the last 360 days      Passed - Last BP in normal range    BP Readings from Last 1 Encounters:  10/07/22 122/74         Passed - Valid encounter within last 6 months    Recent Outpatient Visits           1 month ago Controlled type 2 diabetes mellitus with microalbuminuria, without long-term current use of insulin [facility])   [facility] [place], Danna Hefty, MD   5 months ago Hypertension associated with type 2 diabetes mellitus [facility])   [facility] [place], Danna Hefty, MD   8 months ago Hypertension associated with type 2 diabetes mellitus [facility])   [facility] Alba Cory, MD   12 months ago Well adult exam   [facility] Alba Cory, MD   1 year ago Hypertension associated with type 2 diabetes mellitus [facility])   [facility] Alba Cory, MD       Future Appointments             In 6 days Alba Cory, MD [facility]   In 2 months Alba Cory, MD [facility], [facility]

## 2022-11-30 NOTE — Progress Notes (Unsigned)
Name: Cheryl Barr   MRN: [id number]    DOB: 1956-02-10   Date:12/01/2022       Progress Note  Subjective  Chief Complaint  Annual Exam  HPI  Patient presents for annual CPE.  Diet: she is avoiding salt and processed meat, no colas. She is trying to follow a renal diet  Exercise: discussed 150 minutes per weeks   Last Eye Exam: 2023-reminded to schedule Last Dental Exam: Reminded to schedule  Flowsheet Row Office Visit from 02/20/2020 in [facility]  AUDIT-C Score 0      Depression: Phq 9 is  negative    12/01/2022    8:14 AM 10/07/2022    2:02 PM 10/07/2022    1:31 PM 06/06/2022    1:20 PM 03/07/2022    1:54 PM  Depression screen PHQ 2/9  Decreased Interest 0 1 0 0 0  Down, Depressed, Hopeless 0 1 0 0 0  PHQ - 2 Score 0 2 0 0 0  Altered sleeping 0  0 0 0  Tired, decreased energy 0 0 0 0 0  Change in appetite 0 0 0 0 0  Feeling bad or failure about yourself  0 0 0 0 0  Trouble concentrating 0 0 0 0 0  Moving slowly or fidgety/restless 0 0 0 0 0  Suicidal thoughts 0 0 0 0 0  PHQ-9 Score 0  0 0 0  Difficult doing work/chores  Not difficult at all      Hypertension: BP Readings from Last 3 Encounters:  12/01/22 124/72  10/07/22 122/74  06/06/22 122/76   Obesity: Wt Readings from Last 3 Encounters:  12/01/22 203 lb 9.6 oz (92.4 kg)  10/07/22 210 lb 12.8 oz (95.6 kg)  06/06/22 209 lb 8 oz (95 kg)   BMI Readings from Last 3 Encounters:  12/01/22 31.89 kg/m  10/07/22 33.02 kg/m  06/06/22 32.81 kg/m     Vaccines:   RSV: advised to get it at local pharmacy  Tdap: up to date Shingrix: up to date Pneumonia: up to date Flu: Will receive at employer COVID-19: discussed booster    Hep C Screening: 12/01/17 STD testing and prevention (HIV/chl/gon/syphilis): N/A Intimate partner violence: negative screen  Sexual History : one partner Menstrual History/LMP/Abnormal Bleeding: s/p menopausal  Discussed importance of follow up if any  post-menopausal bleeding: yes  Incontinence Symptoms: negative for symptoms   Breast cancer:  - Last Mammogram: 06/23/22 - BRCA gene screening: N/A  Osteoporosis Prevention : Discussed high calcium and vitamin D supplementation, weight bearing exercises Bone density: 03/17/10, due- ordered   Cervical cancer screening: N/A  Skin cancer: Discussed monitoring for atypical lesions  Colorectal cancer: cologuard positive in 03/23/21  she must have colonoscopy  Lung cancer:  Low Dose CT Chest recommended if Age 89-80 years, 20 pack-year currently smoking OR have quit w/in 15years. Patient does not qualify for screen  She quit smoking over 30 years ago  ECG: 03/27/19  Advanced Care Planning: A voluntary discussion about advance care planning including the explanation and discussion of advance directives.  Discussed health care proxy and Living will, and the patient was able to identify a health care proxy as [employer].  Patient does not have a living will and power of attorney of health care   Lipids: Lab Results  Component Value Date   CHOL 126 03/07/2022   CHOL 134 09/01/2020   CHOL 194 11/21/2019   Lab Results  Component Value Date   HDL  43 (L) 03/07/2022   HDL 41 (L) 09/01/2020   HDL 48 (L) 11/21/2019   Lab Results  Component Value Date   LDLCALC 60 03/07/2022   LDLCALC 65 09/01/2020   LDLCALC 116 (H) 11/21/2019   Lab Results  Component Value Date   TRIG 144 03/07/2022   TRIG 213 (H) 09/01/2020   TRIG 188 (H) 11/21/2019   Lab Results  Component Value Date   CHOLHDL 2.9 03/07/2022   CHOLHDL 3.3 09/01/2020   CHOLHDL 4.0 11/21/2019   No results found for: "LDLDIRECT"  Glucose: Glucose  Date Value Ref Range Status  06/17/2013 137 (H) 65 - 99 mg/dL Final   Glucose, Bld  Date Value Ref Range Status  11/21/2019 106 (H) 65 - 99 mg/dL Final    Comment:    .            Fasting reference interval . For someone without known diabetes, a glucose value between 100 and  125 mg/dL is consistent with prediabetes and should be confirmed with a follow-up test. .   03/27/2019 137 (H) 70 - 99 mg/dL Final  16/11/9602 540 (H) 65 - 99 mg/dL Final    Comment:    .            Fasting reference interval . For someone without known diabetes, a glucose value >125 mg/dL indicates that they may have diabetes and this should be confirmed with a follow-up test. .    Glucose-Capillary  Date Value Ref Range Status  05/04/2021 92 70 - 99 mg/dL Final    Comment:    Glucose reference range applies only to samples taken after fasting for at least 8 hours.  05/24/2019 127 (H) 70 - 99 mg/dL Final    Comment:    Glucose reference range applies only to samples taken after fasting for at least 8 hours.  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   Anemia in chronic kidney disease 06/12/2019   History of late syphilis 11/26/2018   Incomplete rotator cuff tear or rupture of left shoulder, not specified as traumatic 07/26/2016   Thoracic aortic atherosclerosis (HCC) 01/12/2016   DDD (degenerative disc disease), 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 Lemar Livings   BREAST SURGERY Left 2015   COLONOSCOPY  2009   1 polyp found. Dr. Smith Mince   COLONOSCOPY WITH PROPOFOL N/A 05/04/2021   Procedure: COLONOSCOPY WITH PROPOFOL;  Surgeon: Midge Minium, MD;  [facility] END[facility]COPY;  Service:  Endoscopy;  Laterality: N/A;   HERNIA REPAIR  10/09/2014   Ventral/umbilical hernia repair with 10 x 10 cm Atrium mesh in preperitoneal position.   INSERTION OF MESH N/A 10/09/2014   Procedure: INSERTION OF MESH;  Surgeon: Earline Mayotte, MD;  Location: [facility] ORS;  Service: General;  Laterality: N/A;   KNEE SURGERY Left 01/14/2009   STRABISMUS SURGERY     Repair as a child   TUBAL LIGATION  1990   VENTRAL HERNIA REPAIR N/A 10/09/2014   Procedure: HERNIA REPAIR VENTRAL ADULT;  Surgeon: Earline Mayotte, MD;  Location: [facility] ORS;  Service: General;  Laterality: N/A;    Family History  Problem Relation Age of Onset   Hypertension Mother    Diabetes Mother    Heart attack Father    Coronary artery disease Father    Breast cancer Father 15   Diabetes Sister    Gout Brother    Heart disease Brother    Allergies Son        Arts administrator and Publishing rights manager    Social History   Socioeconomic History   Marital status: Widowed    Spouse name: Not on file   Number of children: 3   Years of education: 12th Grade   Highest education level: Not on file  Occupational History   Occupation: kitchen prep     Employer: [employer]  Tobacco Use   Smoking status: Former    Current packs/day: 0.00    Types: Cigarettes    Quit date: 10/02/1983    Years since quitting: 39.1   Smokeless tobacco: Never  Vaping Use   Vaping status: Never Used  Substance and Sexual Activity   Alcohol use: No    Alcohol/week: 0.0 standard drinks of alcohol   Drug use: No    Sexual activity: Yes    Partners: Male    Birth control/protection: None    Comment: declined condoms   Other Topics Concern   Not on file  Social History Narrative   Widowed. Living with boyfriend    No regular exercise.   Two other brothers one adopted since 39 days old, died in 2021-02-02 with liver cancer, also has a foster brother    Social Determinants of Corporate investment banker Strain: Low Risk  (12/01/2022)   Overall Financial Resource Strain (CARDIA)    Difficulty of Paying Living Expenses: Not hard at all  Food Insecurity: No Food Insecurity (12/01/2022)   Hunger Vital Sign    Worried About Running Out of Food in the Last Year: Never true    Ran Out of Food in the Last Year: Never true  Transportation Needs: No Transportation Needs (12/01/2022)   PRAPARE - Administrator, Civil Service (Medical): No    Lack of Transportation (Non-Medical): No  Physical Activity: Inactive (12/01/2022)   Exercise Vital Sign    Days of Exercise per Week: 0 days    Minutes of Exercise per Session: 0 min  Stress: No Stress Concern Present (12/01/2022)   [employer] of Occupational Health - Occupational Stress Questionnaire    Feeling of Stress : Only a little  Social Connections: Moderately Isolated (12/01/2022)   Social Connection and Isolation Panel [NHANES]    Frequency of Communication with Friends and Family: More than three times a week    Frequency of Social Gatherings with Friends and Family: Once a week    Attends Religious Services: More than 4 times per year    Active  Member of Clubs or Organizations: No    Attends Banker Meetings: Never    Marital Status: Widowed  Intimate Partner Violence: Not At Risk (12/01/2022)   Humiliation, Afraid, Rape, and Kick questionnaire    Fear of Current or Ex-Partner: No    Emotionally Abused: No    Physically Abused: No    Sexually Abused: No     Current Outpatient Medications:    amLODipine-olmesartan  (AZOR) 10-40 MG tablet, Take 1 tablet by mouth daily., Disp: 90 tablet, Rfl: 1   aspirin 81 MG EC tablet, Take 81 mg by mouth daily., Disp: , Rfl:    calcitRIOL (ROCALTROL) 0.25 MCG capsule, Take 0.25 mcg by mouth daily., Disp: , Rfl:    carvedilol (COREG) 12.5 MG tablet, Take 1 tablet (12.5 mg total) by mouth 2 (two) times daily with a meal., Disp: 180 tablet, Rfl: 1   conjugated estrogens (PREMARIN) vaginal cream, Apply dime-sized amount to urethra three times weekly., Disp: 42.5 g, Rfl: 3   DULoxetine (CYMBALTA) 60 MG capsule, Take 1 capsule (60 mg total) by mouth daily., Disp: 90 capsule, Rfl: 0   febuxostat (ULORIC) 40 MG tablet, TAKE 1 TABLET BY MOUTH ONCE DAILY FOR  GOUT  (TAKE  IN  THE  PLACE  OF  ALLOPURINOL), Disp: 90 tablet, Rfl: 1   hydrALAZINE (APRESOLINE) 25 MG tablet, Take by mouth 3 (three) times daily., Disp: , Rfl:    pioglitazone (ACTOS) 15 MG tablet, Take 1 tablet (15 mg total) by mouth daily. In place of glipizide for Diabetes, Disp: 90 tablet, Rfl: 1   rosuvastatin (CRESTOR) 40 MG tablet, Take 1 tablet (40 mg total) by mouth daily., Disp: 90 tablet, Rfl: 1   Vitamin D, Ergocalciferol, (DRISDOL) 1.25 MG (50000 UNIT) CAPS capsule, Take 1 capsule (50,000 Units total) by mouth once a week., Disp: 12 capsule, Rfl: 1  No Known Allergies   ROS  Constitutional: Negative for fever , positive for  weight change.  Respiratory: positive  for cough but no  shortness of breath.   Cardiovascular: Negative for chest pain or palpitations.  Gastrointestinal: Negative for abdominal pain, no bowel changes.  Musculoskeletal: Negative for gait problem or joint swelling.  Skin: Negative for rash.  Neurological: Negative for dizziness or headache.  No other specific complaints in a complete review of systems (except as listed in HPI above).   Objective  Vitals:   12/01/22 0811  BP: 124/72  Pulse: 73  Resp: 14  Temp: 97.7 F (36.5 C)  TempSrc: Oral  SpO2: 99%  Weight: 203 lb 9.6 oz  (92.4 kg)  Height: 5\' 7"  (1.702 m)    Body mass index is 31.89 kg/m.  Physical Exam  Constitutional: Patient appears well-developed and well-nourished. No distress.  HENT: Head: Normocephalic and atraumatic. Ears: B TMs ok, no erythema or effusion; Nose: Nose normal. Mouth/Throat: Oropharynx is clear and moist. No oropharyngeal exudate.  Eyes: Conjunctivae and EOM are normal. Pupils are equal, round, and reactive to light. No scleral icterus.  Neck: Normal range of motion. Neck supple. No JVD present. No thyromegaly present.  Cardiovascular: Normal rate, regular rhythm and normal heart sounds.  No murmur heard. No BLE edema. Pulmonary/Chest: Effort normal, scattered rhonchi . No respiratory distress. Abdominal: Soft. Bowel sounds are normal, no distension. There is no tenderness. no masses Breast: no lumps or masses, no nipple discharge or rashes FEMALE GENITALIA:  Not done  RECTAL: not done  Musculoskeletal: Normal range of motion, no joint effusions. No  gross deformities Neurological: he is alert and oriented to person, place, and time. No cranial nerve deficit. Coordination, balance, strength, speech and gait are normal.  Skin: Skin is warm and dry. No rash noted. No erythema.  Psychiatric: Patient has a normal mood and affect. behavior is normal. Judgment and thought content normal.   Recent Results (from the past 2160 hour(s))  POCT HgB A1C     Status: Abnormal   Collection Time: 10/07/22  1:31 PM  Result Value Ref Range   Hemoglobin A1C 5.9 (A) 4.0 - 5.6 %   HbA1c POC (<> result, manual entry)     HbA1c, POC (prediabetic range)     HbA1c, POC (controlled diabetic range)       Fall Risk:    12/01/2022    8:13 AM 10/07/2022    1:30 PM 06/06/2022    1:20 PM 03/07/2022    1:54 PM 12/16/2021    3:00 PM  Fall Risk   Falls in the past year? 1 0 0 1 1  Number falls in past yr: 1   0 0  Injury with Fall? 1   0 0  Risk for fall due to : History of fall(s) No Fall Risks No Fall  Risks No Fall Risks No Fall Risks  Follow up Falls evaluation completed;Education provided;Falls prevention discussed  Falls prevention discussed Falls prevention discussed;Education provided;Falls evaluation completed Falls evaluation completed     Functional Status Survey: Is the patient deaf or have difficulty hearing?: No Does the patient have difficulty seeing, even when wearing glasses/contacts?: No Does the patient have difficulty concentrating, remembering, or making decisions?: No Does the patient have difficulty walking or climbing stairs?: No Does the patient have difficulty dressing or bathing?: No Does the patient have difficulty doing errands alone such as visiting a doctor's office or shopping?: No   Assessment & Plan  1. Well adult exam  - DG Bone Density; Future - Ambulatory referral to Obstetrics / Gynecology - MM 3D SCREENING MAMMOGRAM BILATERAL BREAST; Future - Ambulatory referral to Gastroenterology  2. Need for immunization against influenza  She will get it at work   3. Osteoporosis screening  - DG Bone Density; Future  4. Ovarian cyst, left  - Ambulatory referral to Obstetrics / Gynecology  5. Breast cancer screening other than mammogram  - MM 3D SCREENING MAMMOGRAM BILATERAL BREAST; Future  6. Colon cancer screening  - Ambulatory referral to Gastroenterology    -USPSTF grade A and B recommendations reviewed with patient; age-appropriate recommendations, preventive care, screening tests, etc discussed and encouraged; healthy living encouraged; see AVS for patient education given to patient -Discussed importance of 150 minutes of physical activity weekly, eat two servings of fish weekly, eat one serving of tree nuts ( cashews, pistachios, pecans, almonds.Marland Kitchen) every other day, eat 6 servings of fruit/vegetables daily and drink plenty of water and avoid sweet beverages.   -Reviewed Health Maintenance: Yes.

## 2022-12-01 ENCOUNTER — Ambulatory Visit (INDEPENDENT_AMBULATORY_CARE_PROVIDER_SITE_OTHER): Payer: Commercial Managed Care - PPO | Admitting: Family Medicine

## 2022-12-01 ENCOUNTER — Encounter: Payer: Self-pay | Admitting: Family Medicine

## 2022-12-01 VITALS — BP 124/72 | HR 73 | Temp 97.7°F | Resp 14 | Ht 67.0 in | Wt 203.6 lb

## 2022-12-01 DIAGNOSIS — Z0000 Encounter for general adult medical examination without abnormal findings: Secondary | ICD-10-CM

## 2022-12-01 DIAGNOSIS — Z1239 Encounter for other screening for malignant neoplasm of breast: Secondary | ICD-10-CM

## 2022-12-01 DIAGNOSIS — Z23 Encounter for immunization: Secondary | ICD-10-CM

## 2022-12-01 DIAGNOSIS — Z1211 Encounter for screening for malignant neoplasm of colon: Secondary | ICD-10-CM

## 2022-12-01 DIAGNOSIS — N83202 Unspecified ovarian cyst, left side: Secondary | ICD-10-CM | POA: Diagnosis not present

## 2022-12-01 DIAGNOSIS — Z13820 Encounter for screening for osteoporosis: Secondary | ICD-10-CM

## 2022-12-01 DIAGNOSIS — Z0001 Encounter for general adult medical examination with abnormal findings: Secondary | ICD-10-CM

## 2022-12-01 NOTE — Patient Instructions (Addendum)
Please call [facility] and schedule mammogram and bone density at the same time in May or June 2025  Get RSV and COVID booster at local pharmacy

## 2022-12-21 ENCOUNTER — Encounter (INDEPENDENT_AMBULATORY_CARE_PROVIDER_SITE_OTHER): Payer: Commercial Managed Care - PPO | Admitting: Nurse Practitioner

## 2022-12-21 ENCOUNTER — Encounter (INDEPENDENT_AMBULATORY_CARE_PROVIDER_SITE_OTHER): Payer: Commercial Managed Care - PPO

## 2022-12-21 ENCOUNTER — Other Ambulatory Visit (INDEPENDENT_AMBULATORY_CARE_PROVIDER_SITE_OTHER): Payer: Commercial Managed Care - PPO

## 2022-12-27 ENCOUNTER — Other Ambulatory Visit: Payer: Self-pay | Admitting: Family Medicine

## 2022-12-27 DIAGNOSIS — E1159 Type 2 diabetes mellitus with other circulatory complications: Secondary | ICD-10-CM

## 2023-01-18 ENCOUNTER — Other Ambulatory Visit (INDEPENDENT_AMBULATORY_CARE_PROVIDER_SITE_OTHER): Payer: Self-pay | Admitting: Nurse Practitioner

## 2023-01-18 DIAGNOSIS — N185 Chronic kidney disease, stage 5: Secondary | ICD-10-CM

## 2023-01-20 ENCOUNTER — Encounter (INDEPENDENT_AMBULATORY_CARE_PROVIDER_SITE_OTHER): Payer: Commercial Managed Care - PPO | Admitting: Nurse Practitioner

## 2023-01-20 ENCOUNTER — Ambulatory Visit (INDEPENDENT_AMBULATORY_CARE_PROVIDER_SITE_OTHER): Payer: Commercial Managed Care - PPO

## 2023-01-20 DIAGNOSIS — N185 Chronic kidney disease, stage 5: Secondary | ICD-10-CM | POA: Diagnosis not present

## 2023-01-23 NOTE — Progress Notes (Signed)
Name: Cheryl Barr   MRN: [id number]    DOB: Jun 22, 1955   Date:02/09/2023       Progress Note  Subjective  Chief Complaint  Chief Complaint  Patient presents with   Medical Management of Chronic Issues    HPI  Discussed the use of AI scribe software for clinical note transcription with the patient, who gave verbal consent to proceed.  History of Present Illness   The patient, with a history of chronic kidney disease stage five, focal segmental glomerulonephritis, and secondary hyperparathyroidism, is scheduled for the creation of a left AV fistula in preparation for dialysis.   She reports a persistent cough since September or October, which she describes as similar to bronchitis. The cough has not improved with previous treatments, including some inhalers.   Additionally, the patient has been experiencing right hand tremors, a progression from previous symptoms of right hand numbness. She reports no associated pain or tingling, but notes significant weakness and shaking in the right hand, particularly when attempting to grasp objects.  The patient also has a history of hypertension, well-controlled on Azor and carvedilol, and diabetes, controlled on pioglitazone. She reports no excessive thirst, hunger, or frequent urination, but does note increased urination at night.  The patient's mood has been affected by her multiple medical diagnoses, with periods of feeling down and discouraged. However, she reports being able to recover from these periods and continue with her daily life. She is on the transplant list for a kidney transplant.         Patient Active Problem List   Diagnosis Date Noted   Dysthymia 10/07/2022   Meralgia paresthetica of right side 11/05/2021   Controlled type 2 diabetes mellitus with microalbuminuria, without long-term current use of insulin (HCC) 11/05/2021   History of colonic polyps    Polyp of transverse colon    End stage kidney disease (HCC) 03/08/2021    Reactive airway disease 05/20/2020   Hypertension associated with type 2 diabetes mellitus (HCC) 05/20/2020   Hyperparathyroidism due to renal insufficiency (HCC) 06/12/2019   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   Anemia in chronic kidney disease 06/12/2019   History of late syphilis 11/26/2018   Incomplete rotator cuff tear or rupture of left shoulder, not specified as traumatic 07/26/2016   Thoracic aortic atherosclerosis (HCC) 01/12/2016   DDD (degenerative disc disease), 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/2016   B12 deficiency 01/11/2016   Hernia of abdominal cavity 09/15/2014   Dyslipidemia 08/10/2014   Interval gout 08/10/2014   Microalbuminuria 08/10/2014   Osteoarthritis of left knee 08/10/2014   Vitamin D deficiency 08/10/2014   Intraductal papilloma of breast 06/27/2013   Hypertension, essential, benign 09/02/2009   ABNORMAL EKG 09/02/2009    Past Surgical History:  Procedure Laterality Date   BREAST BIOPSY Left 05/24/2013   neg   BREAST EXCISIONAL BIOPSY Left 06/21/2013   neg/ dr. Lemar Livings   BREAST SURGERY Left 2015   COLONOSCOPY  2009   1 polyp found. Dr. Smith Mince   COLONOSCOPY WITH PROPOFOL N/A 05/04/2021   Procedure: COLONOSCOPY WITH PROPOFOL;  Surgeon: Midge Minium, MD;  Location: [facility] ENDOSCOPY;  Service: Endoscopy;  Laterality: N/A;   HERNIA REPAIR  10/09/2014   Ventral/umbilical hernia repair with 10 x 10 cm Atrium mesh in preperitoneal position.   INSERTION OF MESH N/A 10/09/2014   Procedure: INSERTION OF MESH;  Surgeon: Earline Mayotte, MD;  Location: [facility] ORS;  Service: General;  Laterality: N/A;   KNEE SURGERY Left 01/14/2009   STRABISMUS SURGERY  Repair as a child   TUBAL LIGATION  1990   VENTRAL HERNIA REPAIR N/A 10/09/2014   Procedure: HERNIA REPAIR VENTRAL ADULT;  Surgeon: Earline Mayotte, MD;  Location: [facility] ORS;  Service: General;  Laterality: N/A;    Family History  Problem Relation Age of Onset   Hypertension Mother    Diabetes Mother    Heart attack  Father    Coronary artery disease Father    Breast cancer Father 57   Diabetes Sister    Gout Brother    Heart disease Brother    Allergies Son        Arts administrator and Publishing rights manager    Social History   Tobacco Use   Smoking status: Former    Current packs/day: 0.00    Types: Cigarettes    Quit date: 10/02/1983    Years since quitting: 39.3   Smokeless tobacco: Never  Substance Use Topics   Alcohol use: No    Alcohol/week: 0.0 standard drinks of alcohol     Current Outpatient Medications:    aspirin 81 MG EC tablet, Take 81 mg by mouth daily., Disp: , Rfl:    calcitRIOL (ROCALTROL) 0.25 MCG capsule, Take 0.25 mcg by mouth daily., Disp: , Rfl:    Fluticasone-Umeclidin-Vilant (TRELEGY ELLIPTA) 100-62.5-25 MCG/ACT AEPB, Inhale 1 puff into the lungs daily., Disp: 1 each, Rfl: 01   Vitamin D, Ergocalciferol, (DRISDOL) 1.25 MG (50000 UNIT) CAPS capsule, Take 1 capsule (50,000 Units total) by mouth once a week., Disp: 12 capsule, Rfl: 1   amLODipine-olmesartan (AZOR) 10-40 MG tablet, Take 1 tablet by mouth daily., Disp: 90 tablet, Rfl: 1   carvedilol (COREG) 12.5 MG tablet, Take 1 tablet (12.5 mg total) by mouth 2 (two) times daily with a meal., Disp: 180 tablet, Rfl: 1   conjugated estrogens (PREMARIN) vaginal cream, Apply dime-sized amount to urethra three times weekly. (Patient not taking: Reported on 02/09/2023), Disp: 42.5 g, Rfl: 3   DULoxetine (CYMBALTA) 60 MG capsule, Take 1 capsule (60 mg total) by mouth daily., Disp: 90 capsule, Rfl: 1   febuxostat (ULORIC) 40 MG tablet, TAKE 1 TABLET BY MOUTH ONCE DAILY FOR  GOUT  (TAKE  IN  THE  PLACE  OF  ALLOPURINOL), Disp: 90 tablet, Rfl: 1   pioglitazone (ACTOS) 15 MG tablet, Take 1 tablet (15 mg total) by mouth daily., Disp: 90 tablet, Rfl: 1   rosuvastatin (CRESTOR) 40 MG tablet, Take 1 tablet (40 mg total) by mouth daily., Disp: 90 tablet, Rfl: 1  No Known Allergies  I personally reviewed active problem list, medication list, allergies, family  history, social history with the patient/caregiver today.   ROS  Ten systems reviewed and is negative except as mentioned in HPI    Objective  Vitals:   02/09/23 0901  BP: 126/74  Pulse: 67  Resp: 16  Temp: 98.2 F (36.8 C)  TempSrc: Oral  SpO2: 98%  Weight: 194 lb 3.2 oz (88.1 kg)  Height: 5\' 7"  (1.702 m)    Body mass index is 30.42 kg/m.  Physical Exam  Constitutional: Patient appears well-developed and well-nourished. Obese  No distress.  HEENT: head atraumatic, normocephalic, pupils equal and reactive to light, neck supple Cardiovascular: Normal rate, regular rhythm and normal heart sounds.  No murmur heard. No BLE edema. Pulmonary/Chest: Effort normal and breath sounds normal. No respiratory distress. Abdominal: Soft.  There is no tenderness. Psychiatric: Patient has a normal mood and affect. behavior is normal. Judgment and thought content normal.  Recent Results (from the past 2160 hours)  POCT HgB A1C     Status: Abnormal   Collection Time: 02/09/23  9:05 AM  Result Value Ref Range   Hemoglobin A1C 6.0 (A) 4.0 - 5.6 %   HbA1c POC (<> result, manual entry)     HbA1c, POC (prediabetic range)     HbA1c, POC (controlled diabetic range)      PHQ2/9:    02/09/2023    8:58 AM 12/01/2022    8:14 AM 10/07/2022    2:02 PM 10/07/2022    1:31 PM 06/06/2022    1:20 PM  Depression screen PHQ 2/9  Decreased Interest 0 0 1 0 0  Down, Depressed, Hopeless 0 0 1 0 0  PHQ - 2 Score 0 0 2 0 0  Altered sleeping 0 0  0 0  Tired, decreased energy 0 0 0 0 0  Change in appetite 0 0 0 0 0  Feeling bad or failure about yourself  0 0 0 0 0  Trouble concentrating 0 0 0 0 0  Moving slowly or fidgety/restless 0 0 0 0 0  Suicidal thoughts 0 0 0 0 0  PHQ-9 Score 0 0  0 0  Difficult doing work/chores Not difficult at all  Not difficult at all      phq 9 is negative   Fall Risk:    02/09/2023    8:51 AM 12/01/2022    8:13 AM 10/07/2022    1:30 PM 06/06/2022    1:20 PM  03/07/2022    1:54 PM  Fall Risk   Falls in the past year? 1 1 0 0 1  Number falls in past yr: 0 1   0  Injury with Fall? 0 1   0  Risk for fall due to : Impaired balance/gait History of fall(s) No Fall Risks No Fall Risks No Fall Risks  Follow up Falls prevention discussed;Education provided;Falls evaluation completed Falls evaluation completed;Education provided;Falls prevention discussed  Falls prevention discussed Falls prevention discussed;Education provided;Falls evaluation completed     Assessment & Plan  Assessment and Plan    Chronic Kidney Disease Stage 5 Preparing for dialysis with AV fistula placement scheduled for January 8th. Hyperparathyroidism secondary to renal insufficiency. Focal segmental glomerulonephritis as the cause of kidney failure. Patient is on the transplant list. -Continue follow-up with nephrologist Dr. Thedore Mins. -Continue calcitriol 0.25mg .  Right Hand Tremor Noted atrophy in the thenar area. No numbness or tingling with Phalen's maneuver. -Referral to neurologist Dr. Sherryll Burger for further evaluation and possible nerve conduction study.  Hypertension associated wtih type 2 DM Well controlled with Azor 10/40 and Carvedilol 12.5mg  BID. -Actos 15 mg daily , A1C at goal  -Continue current medications.  Dysthymia Patient reports feeling down due to multiple medical diagnoses but does not desire medication adjustment. -Continue Duloxetine. Encourage positive outlook.  Mild Persistent Reactive Airway Disease Chronic cough since September/October. Previous inhaler therapy not effective. -Prescribe Trelegy inhaler. Reevaluate if no improvement and consider referral to pulmonologist.  Hyperlipidemia/Thoracic atherosclerosis  Last cholesterol check in January showed LDL of 60. Patient on Rosuvastatin 40mg . -Continue current medication. Plan for cholesterol check at next visit.  General Health Maintenance -Schedule bone density scan and mammogram at [facility]. -Schedule eye exam with current ophthalmologist. -Follow-up in 6 months or sooner if any changes.

## 2023-01-27 ENCOUNTER — Ambulatory Visit (INDEPENDENT_AMBULATORY_CARE_PROVIDER_SITE_OTHER): Payer: Commercial Managed Care - PPO | Admitting: Nurse Practitioner

## 2023-01-27 VITALS — BP 116/75 | HR 74 | Resp 16 | Ht 67.0 in | Wt 194.0 lb

## 2023-01-27 DIAGNOSIS — I10 Essential (primary) hypertension: Secondary | ICD-10-CM

## 2023-01-27 DIAGNOSIS — N185 Chronic kidney disease, stage 5: Secondary | ICD-10-CM | POA: Diagnosis not present

## 2023-01-27 DIAGNOSIS — E785 Hyperlipidemia, unspecified: Secondary | ICD-10-CM | POA: Diagnosis not present

## 2023-01-31 ENCOUNTER — Encounter (INDEPENDENT_AMBULATORY_CARE_PROVIDER_SITE_OTHER): Payer: Self-pay | Admitting: Nurse Practitioner

## 2023-01-31 NOTE — Progress Notes (Signed)
Subjective:    Patient ID: Cheryl Barr, female    DOB: [birth year], 67 y.o.   MRN: [id number] Chief Complaint  Patient presents with   New Patient (Initial Visit)    Consult for vein mapping    The patient is seen for evaluation for dialysis access. The patient has chronic renal insufficiency stage V secondary to hypertension. The patient's most recent creatinine clearance is less than 20. The patient volume status has not yet become an issue. Patient's blood pressures been relatively well controlled. There are mild uremic symptoms which appear to be relatively well tolerated at this time.  The patient notes the kidney problem has been present for a long time and has been progressively getting worse.  The patient is followed by nephrology.    The patient is right-handed.  The patient has been considering the various methods of dialysis and wishes to proceed with hemodialysis and therefore creation of AV access is indicated.  No recent shortening of the patient's walking distance or new symptoms consistent with claudication.  No history of rest pain symptoms. No new ulcers or wounds of the lower extremities have occurred.  The patient denies amaurosis fugax or recent TIA symptoms. There are no recent neurological changes noted. There is no history of DVT, PE or superficial thrombophlebitis. No recent episodes of angina or shortness of breath documented.   She has adequate vein diameter creation for left upper extremity AV fistula   Review of Systems  Cardiovascular:  Negative for leg swelling.  All other systems reviewed and are negative.      Objective:   Physical Exam Vitals reviewed.  HENT:     Head: Normocephalic.  Cardiovascular:     Rate and Rhythm: Normal rate.     Pulses:          Radial pulses are 2+ on the right side and 2+ on the left side.  Pulmonary:     Effort: Pulmonary effort is normal.  Skin:    General: Skin is warm and dry.  Neurological:      Mental Status: She is alert and oriented to person, place, and time.  Psychiatric:        Mood and Affect: Mood normal.        Behavior: Behavior normal.        Thought Content: Thought content normal.        Judgment: Judgment normal.     BP 116/75 (BP Location: Left Arm, Cuff Size: Large)   Pulse 74   Resp 16   Ht 5\' 7"  (1.702 m)   Wt 194 lb (88 kg)   BMI 30.38 kg/m   Past Medical History:  Diagnosis Date   Arthritis    Colon polyp 2009   Depression    Diabetes mellitus    Type 2   Fibromyalgia    Gout    Hyperlipidemia    Hypertension    Insomnia    Menopause    Vitamin D deficiency     Social History   Socioeconomic History   Marital status: Widowed    Spouse name: Not on file   Number of children: 3   Years of education: 12th Grade   Highest education level: Not on file  Occupational History   Occupation: kitchen prep     Employer: [employer]  Tobacco Use   Smoking status: Former    Current packs/day: 0.00    Types: Cigarettes    Quit date: 10/02/1983  Years since quitting: 39.3   Smokeless tobacco: Never  Vaping Use   Vaping status: Never Used  Substance and Sexual Activity   Alcohol use: No    Alcohol/week: 0.0 standard drinks of alcohol   Drug use: No   Sexual activity: Yes    Partners: Male    Birth control/protection: None    Comment: declined condoms   Other Topics Concern   Not on file  Social History Narrative   Widowed. Living with boyfriend    No regular exercise.   Two other brothers one adopted since 50 days old, died in 2021/02/26 with liver cancer, also has a foster brother    Social Drivers of Corporate investment banker Strain: Low Risk  (12/01/2022)   Overall Financial Resource Strain (CARDIA)    Difficulty of Paying Living Expenses: Not hard at all  Food Insecurity: No Food Insecurity (12/01/2022)   Hunger Vital Sign    Worried About Running Out of Food in the Last Year: Never true    Ran Out of Food in the Last  Year: Never true  Transportation Needs: No Transportation Needs (12/01/2022)   PRAPARE - Administrator, Civil Service (Medical): No    Lack of Transportation (Non-Medical): No  Physical Activity: Inactive (12/01/2022)   Exercise Vital Sign    Days of Exercise per Week: 0 days    Minutes of Exercise per Session: 0 min  Stress: No Stress Concern Present (12/01/2022)   [employer] of Occupational Health - Occupational Stress Questionnaire    Feeling of Stress : Only a little  Social Connections: Moderately Isolated (12/01/2022)   Social Connection and Isolation Panel [NHANES]    Frequency of Communication with Friends and Family: More than three times a week    Frequency of Social Gatherings with Friends and Family: Once a week    Attends Religious Services: More than 4 times per year    Active Member of [employer] or Organizations: No    Attends Banker Meetings: Never    Marital Status: Widowed  Intimate Partner Violence: Not At Risk (12/01/2022)   Humiliation, Afraid, Rape, and Kick questionnaire    Fear of Current or Ex-Partner: No    Emotionally Abused: No    Physically Abused: No    Sexually Abused: No    Past Surgical History:  Procedure Laterality Date   BREAST BIOPSY Left 05/24/2013   neg   BREAST EXCISIONAL BIOPSY Left 06/21/2013   neg/ dr. Lemar Livings   BREAST SURGERY Left 02-26-2014   COLONOSCOPY  02-27-08   1 polyp found. Dr. Smith Mince   COLONOSCOPY WITH PROPOFOL N/A 05/04/2021   Procedure: COLONOSCOPY WITH PROPOFOL;  Surgeon: Midge Minium, MD;  Location: [facility] ENDOSCOPY;  Service: Endoscopy;  Laterality: N/A;   HERNIA REPAIR  10/09/2014   Ventral/umbilical hernia repair with 10 x 10 cm Atrium mesh in preperitoneal position.   INSERTION OF MESH N/A 10/09/2014   Procedure: INSERTION OF MESH;  Surgeon: Earline Mayotte, MD;  Location: [facility] ORS;  Service: General;  Laterality: N/A;   KNEE SURGERY Left 01/14/2009   STRABISMUS SURGERY     Repair as a child   TUBAL  LIGATION  1990   VENTRAL HERNIA REPAIR N/A 10/09/2014   Procedure: HERNIA REPAIR VENTRAL ADULT;  Surgeon: Earline Mayotte, MD;  Location: [facility] ORS;  Service: General;  Laterality: N/A;    Family History  Problem Relation Age of Onset   Hypertension Mother    Diabetes Mother  Heart attack Father    Coronary artery disease Father    Breast cancer Father 60   Diabetes Sister    Gout Brother    Heart disease Brother    Allergies Son        Food and Bee Stings    No Known Allergies     Latest Ref Rng & Units 11/21/2019   12:07 PM 05/24/2019    7:54 AM 03/27/2019   12:51 AM  CBC  WBC 3.8 - 10.8 Thousand/uL 6.3  6.3  6.5   Hemoglobin 11.7 - 15.5 g/dL 08.6  57.8  46.9   Hematocrit 35.0 - 45.0 % 32.0  31.6  32.8   Platelets 140 - 400 Thousand/uL 320  325  255       CMP     Component Value Date/Time   NA 143 11/21/2019 1207   NA 141 06/17/2013 0941   K 3.8 11/21/2019 1207   K 3.1 (L) 06/17/2013 0941   CL 106 11/21/2019 1207   CL 104 06/17/2013 0941   CO2 28 11/21/2019 1207   CO2 30 06/17/2013 0941   GLUCOSE 106 (H) 11/21/2019 1207   GLUCOSE 137 (H) 06/17/2013 0941   BUN 42 (H) 11/21/2019 1207   BUN 18 06/17/2013 0941   CREATININE 2.11 (H) 11/21/2019 1207   CALCIUM 9.5 11/21/2019 1207   CALCIUM 9.5 11/21/2019 1207   CALCIUM 9.6 06/17/2013 0941   PROT 7.6 03/07/2022 1418   ALBUMIN 3.8 03/27/2019 0051   AST 10 03/07/2022 1418   ALT 10 03/07/2022 1418   ALKPHOS 79 03/27/2019 0051   BILITOT 0.3 03/07/2022 1418   GFRNONAA 24 (L) 11/21/2019 1207     No results found.     Assessment & Plan:   1. Chronic kidney disease, stage 5 (HCC) (Primary) Recommend:  At this time the patient does not have appropriate extremity access for dialysis  Patient should have a left upper extremity AV fistula created  The risks, benefits and alternative therapies were reviewed in detail with the patient.  All questions were answered.  The patient agrees to proceed with surgery.    The patient will follow up with me in the office after the surgery.  2. Hypertension, essential, benign Continue antihypertensive medications as already ordered, these medications have been reviewed and there are no changes at this time.  3. Dyslipidemia Continue statin as ordered and reviewed, no changes at this time   Current Outpatient Medications on File Prior to Visit  Medication Sig Dispense Refill   amLODipine-olmesartan (AZOR) 10-40 MG tablet Take 1 tablet by mouth daily. 90 tablet 1   aspirin 81 MG EC tablet Take 81 mg by mouth daily.     calcitRIOL (ROCALTROL) 0.25 MCG capsule Take 0.25 mcg by mouth daily.     carvedilol (COREG) 12.5 MG tablet Take 1 tablet (12.5 mg total) by mouth 2 (two) times daily with a meal. 180 tablet 1   DULoxetine (CYMBALTA) 60 MG capsule Take 1 capsule (60 mg total) by mouth daily. 90 capsule 0   febuxostat (ULORIC) 40 MG tablet TAKE 1 TABLET BY MOUTH ONCE DAILY FOR  GOUT  (TAKE  IN  THE  PLACE  OF  ALLOPURINOL) 90 tablet 1   pioglitazone (ACTOS) 15 MG tablet TAKE 1 TABLET BY MOUTH ONCE DAILY IN  PLACE  OF  GLIPIZIDE  FOR  DIABETES 90 tablet 0   rosuvastatin (CRESTOR) 40 MG tablet Take 1 tablet (40 mg total) by mouth daily. 90 tablet  1   Vitamin D, Ergocalciferol, (DRISDOL) 1.25 MG (50000 UNIT) CAPS capsule Take 1 capsule (50,000 Units total) by mouth once a week. 12 capsule 1   conjugated estrogens (PREMARIN) vaginal cream Apply dime-sized amount to urethra three times weekly. (Patient not taking: Reported on 01/27/2023) 42.5 g 3   No current facility-administered medications on file prior to visit.    There are no Patient Instructions on file for this visit. No follow-ups on file.   Georgiana Spinner, NP

## 2023-02-01 ENCOUNTER — Telehealth (INDEPENDENT_AMBULATORY_CARE_PROVIDER_SITE_OTHER): Payer: Self-pay

## 2023-02-01 NOTE — Telephone Encounter (Addendum)
I attempted to contact the patient to schedule her for a left AV fistula with Dr. Wyn Quaker. A message was left for a return call. I spoke with the patient's daughter to have the patient to contact me as well. Patient called back and will be scheduled on [facility] at the [facility]. Pre-op is scheduled on 02/13/23 at 9:00 am at the [facility]. Pre-surgical instructions were discussed  and will be mailed.

## 2023-02-09 ENCOUNTER — Encounter: Payer: Self-pay | Admitting: Family Medicine

## 2023-02-09 ENCOUNTER — Ambulatory Visit: Payer: Commercial Managed Care - PPO | Admitting: Family Medicine

## 2023-02-09 VITALS — BP 126/74 | HR 67 | Temp 98.2°F | Resp 16 | Ht 67.0 in | Wt 194.2 lb

## 2023-02-09 DIAGNOSIS — N185 Chronic kidney disease, stage 5: Secondary | ICD-10-CM | POA: Diagnosis not present

## 2023-02-09 DIAGNOSIS — I700 Atherosclerosis of aorta: Secondary | ICD-10-CM | POA: Diagnosis not present

## 2023-02-09 DIAGNOSIS — E1159 Type 2 diabetes mellitus with other circulatory complications: Secondary | ICD-10-CM | POA: Diagnosis not present

## 2023-02-09 DIAGNOSIS — R251 Tremor, unspecified: Secondary | ICD-10-CM

## 2023-02-09 DIAGNOSIS — I152 Hypertension secondary to endocrine disorders: Secondary | ICD-10-CM

## 2023-02-09 DIAGNOSIS — Z23 Encounter for immunization: Secondary | ICD-10-CM

## 2023-02-09 DIAGNOSIS — Z7984 Long term (current) use of oral hypoglycemic drugs: Secondary | ICD-10-CM

## 2023-02-09 DIAGNOSIS — E785 Hyperlipidemia, unspecified: Secondary | ICD-10-CM

## 2023-02-09 DIAGNOSIS — E1129 Type 2 diabetes mellitus with other diabetic kidney complication: Secondary | ICD-10-CM

## 2023-02-09 DIAGNOSIS — N051 Unspecified nephritic syndrome with focal and segmental glomerular lesions: Secondary | ICD-10-CM

## 2023-02-09 DIAGNOSIS — J4530 Mild persistent asthma, uncomplicated: Secondary | ICD-10-CM

## 2023-02-09 DIAGNOSIS — Z1211 Encounter for screening for malignant neoplasm of colon: Secondary | ICD-10-CM

## 2023-02-09 DIAGNOSIS — F341 Dysthymic disorder: Secondary | ICD-10-CM

## 2023-02-09 DIAGNOSIS — N2581 Secondary hyperparathyroidism of renal origin: Secondary | ICD-10-CM

## 2023-02-09 DIAGNOSIS — M109 Gout, unspecified: Secondary | ICD-10-CM

## 2023-02-09 LAB — POCT GLYCOSYLATED HEMOGLOBIN (HGB A1C): Hemoglobin A1C: 6 % — AB (ref 4.0–5.6)

## 2023-02-09 MED ORDER — PIOGLITAZONE HCL 15 MG PO TABS
15.0000 mg | ORAL_TABLET | Freq: Every day | ORAL | 1 refills | Status: DC
Start: 2023-02-09 — End: 2023-10-03

## 2023-02-09 MED ORDER — TRELEGY ELLIPTA 100-62.5-25 MCG/ACT IN AEPB: 1.0000 | INHALATION_SPRAY | Freq: Every day | RESPIRATORY_TRACT | Status: DC

## 2023-02-09 MED ORDER — CARVEDILOL 12.5 MG PO TABS
12.5000 mg | ORAL_TABLET | Freq: Two times a day (BID) | ORAL | 1 refills | Status: DC
Start: 2023-02-09 — End: 2023-10-03

## 2023-02-09 MED ORDER — DULOXETINE HCL 60 MG PO CPEP
60.0000 mg | ORAL_CAPSULE | Freq: Every day | ORAL | 1 refills | Status: DC
Start: 2023-02-09 — End: 2023-10-03

## 2023-02-09 MED ORDER — AMLODIPINE-OLMESARTAN 10-40 MG PO TABS
1.0000 | ORAL_TABLET | Freq: Every day | ORAL | 1 refills | Status: DC
Start: 2023-02-09 — End: 2024-01-03

## 2023-02-09 MED ORDER — FEBUXOSTAT 40 MG PO TABS
ORAL_TABLET | ORAL | 1 refills | Status: DC
Start: 2023-02-09 — End: 2023-10-03

## 2023-02-09 MED ORDER — ROSUVASTATIN CALCIUM 40 MG PO TABS
40.0000 mg | ORAL_TABLET | Freq: Every day | ORAL | 1 refills | Status: DC
Start: 2023-02-09 — End: 2023-09-25

## 2023-02-09 NOTE — Patient Instructions (Signed)
[facility] to schedule bone density and mammogram

## 2023-02-13 ENCOUNTER — Other Ambulatory Visit (INDEPENDENT_AMBULATORY_CARE_PROVIDER_SITE_OTHER): Payer: Self-pay | Admitting: Nurse Practitioner

## 2023-02-13 ENCOUNTER — Encounter
Admission: RE | Admit: 2023-02-13 | Discharge: 2023-02-13 | Disposition: A | Discharge status: Home / Self Care | Payer: Commercial Managed Care - PPO | Source: Ambulatory Visit | Attending: Vascular Surgery | Admitting: Vascular Surgery

## 2023-02-13 ENCOUNTER — Other Ambulatory Visit: Payer: Self-pay

## 2023-02-13 VITALS — BP 146/75 | HR 58 | Temp 98.8°F | Resp 18 | Ht 67.0 in | Wt 193.0 lb

## 2023-02-13 DIAGNOSIS — N186 End stage renal disease: Secondary | ICD-10-CM | POA: Insufficient documentation

## 2023-02-13 DIAGNOSIS — E1129 Type 2 diabetes mellitus with other diabetic kidney complication: Secondary | ICD-10-CM | POA: Diagnosis not present

## 2023-02-13 DIAGNOSIS — R809 Proteinuria, unspecified: Secondary | ICD-10-CM

## 2023-02-13 DIAGNOSIS — Z01818 Encounter for other preprocedural examination: Secondary | ICD-10-CM | POA: Insufficient documentation

## 2023-02-13 DIAGNOSIS — E1122 Type 2 diabetes mellitus with diabetic chronic kidney disease: Secondary | ICD-10-CM | POA: Insufficient documentation

## 2023-02-13 LAB — CBC WITH DIFFERENTIAL/PLATELET
Abs Immature Granulocytes: 0.01 10*3/uL (ref 0.00–0.07)
Basophils Absolute: 0 10*3/uL (ref 0.0–0.1)
Basophils Relative: 1 %
Eosinophils Absolute: 0.1 10*3/uL (ref 0.0–0.5)
Eosinophils Relative: 3 %
HCT: 28.3 % — ABNORMAL LOW (ref 36.0–46.0)
Hemoglobin: 8.8 g/dL — ABNORMAL LOW (ref 12.0–15.0)
Immature Granulocytes: 0 %
Lymphocytes Relative: 34 %
Lymphs Abs: 1.7 10*3/uL (ref 0.7–4.0)
MCH: 26.3 pg (ref 26.0–34.0)
MCHC: 31.1 g/dL (ref 30.0–36.0)
MCV: 84.5 fL (ref 80.0–100.0)
Monocytes Absolute: 0.3 10*3/uL (ref 0.1–1.0)
Monocytes Relative: 5 %
Neutro Abs: 2.8 10*3/uL (ref 1.7–7.7)
Neutrophils Relative %: 57 %
Platelets: 310 10*3/uL (ref 150–400)
RBC: 3.35 MIL/uL — ABNORMAL LOW (ref 3.87–5.11)
RDW: 15.7 % — ABNORMAL HIGH (ref 11.5–15.5)
WBC: 5 10*3/uL (ref 4.0–10.5)
nRBC: 0 % (ref 0.0–0.2)

## 2023-02-13 LAB — BASIC METABOLIC PANEL
Anion gap: 11 (ref 5–15)
BUN: 52 mg/dL — ABNORMAL HIGH (ref 8–23)
CO2: 26 mmol/L (ref 22–32)
Calcium: 9.2 mg/dL (ref 8.9–10.3)
Chloride: 105 mmol/L (ref 98–111)
Creatinine, Ser: 3.17 mg/dL — ABNORMAL HIGH (ref 0.44–1.00)
GFR, Estimated: 15 mL/min — ABNORMAL LOW (ref >60)
Glucose, Bld: 94 mg/dL (ref 70–99)
Potassium: 3.4 mmol/L — ABNORMAL LOW (ref 3.5–5.1)
Sodium: 142 mmol/L (ref 135–145)

## 2023-02-13 LAB — TYPE AND SCREEN
ABO/RH(D): A POS
Antibody Screen: NEGATIVE

## 2023-02-13 NOTE — Patient Instructions (Addendum)
Your procedure is scheduled on: Wednesday 02/22/23 Report to the Registration Desk on the 1st floor of the [facility]. To find out your arrival time, please call [phone] between 1PM - 3PM on: Tuesday 02/21/23  If your arrival time is 6:00 am, do not arrive before that time as the [facility] entrance doors do not open until 6:00 am.  REMEMBER: Instructions that are not followed completely may result in serious medical risk, up to and including death; or upon the discretion of your surgeon and anesthesiologist your surgery may need to be rescheduled.  Do not eat food or drink fluids after midnight the night before surgery.  No gum chewing or hard candies.   One week prior to surgery: Stop Anti-inflammatories (NSAIDS) such as Advil, Aleve, Ibuprofen, Motrin, Naproxen, Naprosyn and Aspirin based products such as Excedrin, Goody's Powder, BC Powder. Stop ANY OVER THE COUNTER supplements until after surgery.  You may however, continue to take Tylenol if needed for pain up until the day of surgery.   Continue taking all of your other prescription medications up until the day of surgery.  ON THE DAY OF SURGERY ONLY TAKE THESE MEDICATIONS WITH SIPS OF WATER:  carvedilol (COREG) 12.5 MG  DULoxetine (CYMBALTA) 60 MG  rosuvastatin (CRESTOR) 40 MG  febuxostat (ULORIC) 40 MG   Use inhalers on the day of surgery and bring to the hospital.   No Alcohol for 24 hours before or after surgery.  No Smoking including e-cigarettes for 24 hours before surgery.  No chewable tobacco products for at least 6 hours before surgery.  No nicotine patches on the day of surgery.  Do not use any "recreational" drugs for at least a week (preferably 2 weeks) before your surgery.  Please be advised that the combination of cocaine and anesthesia may have negative outcomes, up to and including death. If you test positive for cocaine, your surgery will be cancelled.  On the morning of surgery brush your teeth  with toothpaste and water, you may rinse your mouth with mouthwash if you wish. Do not swallow any toothpaste or mouthwash.  Use CHG Soap or wipes as directed on instruction sheet.  Do not wear jewelry, make-up, hairpins, clips or nail polish.  For welded (permanent) jewelry: bracelets, anklets, waist bands, etc.  Please have this removed prior to surgery.  If it is not removed, there is a chance that hospital personnel will need to cut it off on the day of surgery.  Do not wear lotions, powders, or perfumes.   Do not shave body hair from the neck down 48 hours before surgery.  Contact lenses, hearing aids and dentures may not be worn into surgery.  Do not bring valuables to the hospital. [facility] is not responsible for any missing/lost belongings or valuables.   Total Shoulder Arthroplasty:  use Benzoyl Peroxide 5% Gel as directed on instruction sheet.  Bring your C-PAP to the hospital in case you may have to spend the night.   Notify your doctor if there is any change in your medical condition (cold, fever, infection).  Wear comfortable clothing (specific to your surgery type) to the hospital.  After surgery, you can help prevent lung complications by doing breathing exercises.  Take deep breaths and cough every 1-2 hours. Your doctor may order a device called an Incentive Spirometer to help you take deep breaths. When coughing or sneezing, hold a pillow firmly against your incision with both hands. This is called "splinting." Doing this helps  protect your incision. It also decreases belly discomfort.  If you are being admitted to the hospital overnight, leave your suitcase in the car. After surgery it may be brought to your room.  In case of increased patient census, it may be necessary for you, the patient, to continue your postoperative care in the Same Day Surgery department.  If you are being discharged the day of surgery, you will not be allowed to drive home. You will  need a responsible individual to drive you home and stay with you for 24 hours after surgery.   If you are taking public transportation, you will need to have a responsible individual with you.  Please call the Pre-admissions Testing Dept. at [phone] if you have any questions about these instructions.  Surgery Visitation Policy:  Patients having surgery or a procedure may have two visitors.  Children under the age of 83 must have an adult with them who is not the patient.  Inpatient Visitation:    Visiting hours are 7 a.m. to 8 p.m. Up to four visitors are allowed at one time in a patient room. The visitors may rotate out with other people during the day.  One visitor age 51 or older may stay with the patient overnight and must be in the room by 8 p.m.    Preparing for Surgery with CHLORHEXIDINE GLUCONATE (CHG) Soap  Chlorhexidine Gluconate (CHG) Soap  o An antiseptic cleaner that kills germs and bonds with the skin to continue killing germs even after washing  o Used for showering the night before surgery and morning of surgery  Before surgery, you can play an important role by reducing the number of germs on your skin.  CHG (Chlorhexidine gluconate) soap is an antiseptic cleanser which kills germs and bonds with the skin to continue killing germs even after washing.  Please do not use if you have an allergy to CHG or antibacterial soaps. If your skin becomes reddened/irritated stop using the CHG.  1. Shower the NIGHT BEFORE SURGERY and the MORNING OF SURGERY with CHG soap.  2. If you choose to wash your hair, wash your hair first as usual with your normal shampoo.  3. After shampooing, rinse your hair and body thoroughly to remove the shampoo.  4. Use CHG as you would any other liquid soap. You can apply CHG directly to the skin and wash gently with a scrungie or a clean washcloth.  5. Apply the CHG soap to your body only from the neck down. Do not use on open wounds  or open sores. Avoid contact with your eyes, ears, mouth, and genitals (private parts). Wash face and genitals (private parts) with your normal soap.  6. Wash thoroughly, paying special attention to the area where your surgery will be performed.  7. Thoroughly rinse your body with warm water.  8. Do not shower/wash with your normal soap after using and rinsing off the CHG soap.  9. Pat yourself dry with a clean towel.  10. Wear clean pajamas to bed the night before surgery.  12. Place clean sheets on your bed the night of your first shower and do not sleep with pets.  13. Shower again with the CHG soap on the day of surgery prior to arriving at the hospital.  14. Do not apply any deodorants/lotions/powders.  15. Please wear clean clothes to the hospital.

## 2023-02-22 ENCOUNTER — Ambulatory Visit
Admission: RE | Admit: 2023-02-22 | Discharge: 2023-02-22 | Disposition: A | Discharge status: Home / Self Care | Payer: Commercial Managed Care - PPO | Attending: Vascular Surgery | Admitting: Vascular Surgery

## 2023-02-22 ENCOUNTER — Encounter
Admission: RE | Disposition: A | Discharge status: Home / Self Care | Payer: Self-pay | Source: Home / Self Care | Attending: Vascular Surgery

## 2023-02-22 ENCOUNTER — Ambulatory Visit: Payer: Commercial Managed Care - PPO | Admitting: Urgent Care

## 2023-02-22 ENCOUNTER — Other Ambulatory Visit: Payer: Self-pay

## 2023-02-22 ENCOUNTER — Encounter: Payer: Self-pay | Admitting: Vascular Surgery

## 2023-02-22 ENCOUNTER — Ambulatory Visit: Payer: Commercial Managed Care - PPO | Admitting: Certified Registered Nurse Anesthetist

## 2023-02-22 DIAGNOSIS — Z87891 Personal history of nicotine dependence: Secondary | ICD-10-CM | POA: Diagnosis not present

## 2023-02-22 DIAGNOSIS — F32A Depression, unspecified: Secondary | ICD-10-CM | POA: Insufficient documentation

## 2023-02-22 DIAGNOSIS — E1122 Type 2 diabetes mellitus with diabetic chronic kidney disease: Secondary | ICD-10-CM | POA: Diagnosis present

## 2023-02-22 DIAGNOSIS — N185 Chronic kidney disease, stage 5: Secondary | ICD-10-CM | POA: Insufficient documentation

## 2023-02-22 DIAGNOSIS — M797 Fibromyalgia: Secondary | ICD-10-CM | POA: Insufficient documentation

## 2023-02-22 DIAGNOSIS — J45909 Unspecified asthma, uncomplicated: Secondary | ICD-10-CM | POA: Diagnosis not present

## 2023-02-22 DIAGNOSIS — N184 Chronic kidney disease, stage 4 (severe): Secondary | ICD-10-CM

## 2023-02-22 DIAGNOSIS — R809 Proteinuria, unspecified: Secondary | ICD-10-CM

## 2023-02-22 DIAGNOSIS — I120 Hypertensive chronic kidney disease with stage 5 chronic kidney disease or end stage renal disease: Secondary | ICD-10-CM | POA: Insufficient documentation

## 2023-02-22 DIAGNOSIS — Z7984 Long term (current) use of oral hypoglycemic drugs: Secondary | ICD-10-CM | POA: Insufficient documentation

## 2023-02-22 HISTORY — PX: AV FISTULA PLACEMENT: SHX1204

## 2023-02-22 LAB — GLUCOSE, CAPILLARY
Glucose-Capillary: 103 mg/dL — ABNORMAL HIGH (ref 70–99)
Glucose-Capillary: 106 mg/dL — ABNORMAL HIGH (ref 70–99)

## 2023-02-22 LAB — ABO/RH: ABO/RH(D): A POS

## 2023-02-22 SURGERY — ARTERIOVENOUS (AV) FISTULA CREATION
Anesthesia: General | Site: Arm Upper | Laterality: Left

## 2023-02-22 MED ORDER — ACETAMINOPHEN 10 MG/ML IV SOLN
INTRAVENOUS | Status: DC | PRN
  Administered 2023-02-22: 1000 mg via INTRAVENOUS

## 2023-02-22 MED ORDER — HEPARIN SODIUM (PORCINE) 1000 UNIT/ML IJ SOLN
INTRAMUSCULAR | Status: DC | PRN
  Administered 2023-02-22: 3000 [IU] via INTRAVENOUS

## 2023-02-22 MED ORDER — HYDROCODONE-ACETAMINOPHEN 5-325 MG PO TABS: 2.0000 | ORAL_TABLET | Freq: Four times a day (QID) | ORAL | 0 refills | Status: DC | PRN

## 2023-02-22 MED ORDER — CHLORHEXIDINE GLUCONATE CLOTH 2 % EX PADS
6.0000 | MEDICATED_PAD | Freq: Once | CUTANEOUS | Status: AC
  Administered 2023-02-22: 6 via TOPICAL

## 2023-02-22 MED ORDER — MIDAZOLAM HCL 2 MG/2ML IJ SOLN
INTRAMUSCULAR | Status: DC | PRN
  Administered 2023-02-22: 2 mg via INTRAVENOUS

## 2023-02-22 MED ORDER — HEPARIN SODIUM (PORCINE) 5000 UNIT/ML IJ SOLN
INTRAMUSCULAR | Status: AC
  Filled 2023-02-22: qty 1

## 2023-02-22 MED ORDER — SODIUM CHLORIDE 0.9 % IR SOLN: Status: DC | PRN

## 2023-02-22 MED ORDER — SODIUM CHLORIDE 0.9 % IV SOLN: INTRAVENOUS | Status: DC

## 2023-02-22 MED ORDER — DEXAMETHASONE SODIUM PHOSPHATE 10 MG/ML IJ SOLN
INTRAMUSCULAR | Status: AC
  Filled 2023-02-22: qty 1

## 2023-02-22 MED ORDER — FENTANYL CITRATE (PF) 100 MCG/2ML IJ SOLN
INTRAMUSCULAR | Status: DC | PRN
  Administered 2023-02-22 (×4): 25 ug via INTRAVENOUS

## 2023-02-22 MED ORDER — ACETAMINOPHEN 10 MG/ML IV SOLN
INTRAVENOUS | Status: AC
Start: 2023-02-22 — End: ?
  Filled 2023-02-22: qty 100

## 2023-02-22 MED ORDER — CEFAZOLIN SODIUM-DEXTROSE 2-4 GM/100ML-% IV SOLN
2.0000 g | INTRAVENOUS | Status: AC
  Administered 2023-02-22: 2 g via INTRAVENOUS

## 2023-02-22 MED ORDER — ORAL CARE MOUTH RINSE: 15.0000 mL | Freq: Once | OROMUCOSAL | Status: AC

## 2023-02-22 MED ORDER — GLYCOPYRROLATE 0.2 MG/ML IJ SOLN
INTRAMUSCULAR | Status: DC | PRN
  Administered 2023-02-22: .2 mg via INTRAVENOUS

## 2023-02-22 MED ORDER — MIDAZOLAM HCL 2 MG/2ML IJ SOLN
INTRAMUSCULAR | Status: AC
Start: 2023-02-22 — End: ?
  Filled 2023-02-22: qty 2

## 2023-02-22 MED ORDER — FENTANYL CITRATE (PF) 100 MCG/2ML IJ SOLN
INTRAMUSCULAR | Status: AC
  Filled 2023-02-22: qty 2

## 2023-02-22 MED ORDER — EPHEDRINE SULFATE (PRESSORS) 50 MG/ML IJ SOLN
INTRAMUSCULAR | Status: DC | PRN
  Administered 2023-02-22 (×2): 7.5 mg via INTRAVENOUS

## 2023-02-22 MED ORDER — LIDOCAINE HCL (PF) 2 % IJ SOLN
INTRAMUSCULAR | Status: AC
  Filled 2023-02-22: qty 5

## 2023-02-22 MED ORDER — PROPOFOL 10 MG/ML IV BOLUS
INTRAVENOUS | Status: DC | PRN
  Administered 2023-02-22: 150 mg via INTRAVENOUS

## 2023-02-22 MED ORDER — DEXAMETHASONE SODIUM PHOSPHATE 10 MG/ML IJ SOLN
INTRAMUSCULAR | Status: DC | PRN
  Administered 2023-02-22: 10 mg via INTRAVENOUS

## 2023-02-22 MED ORDER — CHLORHEXIDINE GLUCONATE 0.12 % MT SOLN
OROMUCOSAL | Status: AC
  Filled 2023-02-22: qty 15

## 2023-02-22 MED ORDER — CHLORHEXIDINE GLUCONATE 0.12 % MT SOLN
15.0000 mL | Freq: Once | OROMUCOSAL | Status: AC
  Administered 2023-02-22: 15 mL via OROMUCOSAL

## 2023-02-22 MED ORDER — LIDOCAINE HCL (CARDIAC) PF 100 MG/5ML IV SOSY
PREFILLED_SYRINGE | INTRAVENOUS | Status: DC | PRN
  Administered 2023-02-22: 100 mg via INTRAVENOUS

## 2023-02-22 MED ORDER — ONDANSETRON HCL 4 MG/2ML IJ SOLN
INTRAMUSCULAR | Status: AC
  Filled 2023-02-22: qty 2

## 2023-02-22 MED ORDER — CEFAZOLIN SODIUM-DEXTROSE 2-4 GM/100ML-% IV SOLN
INTRAVENOUS | Status: AC
  Filled 2023-02-22: qty 100

## 2023-02-22 MED ORDER — DROPERIDOL 2.5 MG/ML IJ SOLN: 0.6250 mg | Freq: Once | INTRAMUSCULAR | Status: DC | PRN

## 2023-02-22 MED ORDER — PROPOFOL 1000 MG/100ML IV EMUL
INTRAVENOUS | Status: AC
  Filled 2023-02-22: qty 100

## 2023-02-22 MED ORDER — EPHEDRINE 5 MG/ML INJ
INTRAVENOUS | Status: AC
  Filled 2023-02-22: qty 5

## 2023-02-22 MED ORDER — KETOROLAC TROMETHAMINE 30 MG/ML IJ SOLN: INTRAMUSCULAR | Status: DC | PRN

## 2023-02-22 MED ORDER — HEPARIN SODIUM (PORCINE) 1000 UNIT/ML IJ SOLN
INTRAMUSCULAR | Status: AC
Start: 2023-02-22 — End: ?
  Filled 2023-02-22: qty 10

## 2023-02-22 MED ORDER — FENTANYL CITRATE (PF) 100 MCG/2ML IJ SOLN: 25.0000 ug | INTRAMUSCULAR | Status: DC | PRN

## 2023-02-22 SURGICAL SUPPLY — 42 items
BAG DECANTER FOR FLEXI CONT (MISCELLANEOUS) ×1 IMPLANT
BLADE SURG SZ11 CARB STEEL (BLADE) ×1 IMPLANT
BRUSH SCRUB EZ 4% CHG (MISCELLANEOUS) ×1 IMPLANT
CHLORAPREP W/TINT 26 (MISCELLANEOUS) ×1 IMPLANT
CLAMP SUTURE YELLOW 5 PAIRS (MISCELLANEOUS) ×1 IMPLANT
CLIP SPRNG 6 S-JAW DBL (CLIP) ×1 IMPLANT
CLIP SPRNG 6MM S-JAW DBL (CLIP) ×1
DERMABOND ADVANCED .7 DNX12 (GAUZE/BANDAGES/DRESSINGS) ×1 IMPLANT
DRESSING SURGICEL FIBRLLR 1X2 (HEMOSTASIS) ×1 IMPLANT
DRSG SURGICEL FIBRILLAR 1X2 (HEMOSTASIS) ×1
ELECT CAUTERY BLADE 6.4 (BLADE) ×1 IMPLANT
ELECT REM PT RETURN 9FT ADLT (ELECTROSURGICAL) ×1
ELECTRODE REM PT RTRN 9FT ADLT (ELECTROSURGICAL) ×1 IMPLANT
GEL ULTRASOUND 20GR AQUASONIC (MISCELLANEOUS) IMPLANT
GLOVE BIO SURGEON STRL SZ7 (GLOVE) ×2 IMPLANT
GOWN STRL REUS W/ TWL LRG LVL3 (GOWN DISPOSABLE) ×2 IMPLANT
GOWN STRL REUS W/TWL 2XL LVL3 (GOWN DISPOSABLE) ×2 IMPLANT
IV NS 500ML BAXH (IV SOLUTION) ×1 IMPLANT
KIT TURNOVER KIT A (KITS) ×1 IMPLANT
LABEL OR SOLS (LABEL) ×1 IMPLANT
LOOP VESSEL MAXI 1X406 RED (MISCELLANEOUS) ×1 IMPLANT
LOOP VESSEL MINI 0.8X406 BLUE (MISCELLANEOUS) ×1 IMPLANT
MANIFOLD NEPTUNE II (INSTRUMENTS) ×1 IMPLANT
NDL FILTER BLUNT 18X1 1/2 (NEEDLE) ×1 IMPLANT
NEEDLE FILTER BLUNT 18X1 1/2 (NEEDLE) ×1
NS IRRIG 500ML POUR BTL (IV SOLUTION) ×1 IMPLANT
PACK EXTREMITY ARMC (MISCELLANEOUS) ×1 IMPLANT
PAD PREP OB/GYN DISP 24X41 (PERSONAL CARE ITEMS) ×1 IMPLANT
STOCKINETTE 48X4 2 PLY STRL (GAUZE/BANDAGES/DRESSINGS) ×1 IMPLANT
STOCKINETTE STRL 4IN 9604848 (GAUZE/BANDAGES/DRESSINGS) ×1
SUT MNCRL AB 4-0 PS2 18 (SUTURE) ×1 IMPLANT
SUT PROLENE 6 0 BV (SUTURE) ×4 IMPLANT
SUT SILK 2-0 18XBRD TIE 12 (SUTURE) ×1 IMPLANT
SUT SILK 3-0 18XBRD TIE 12 (SUTURE) ×1 IMPLANT
SUT SILK 4-0 18XBRD TIE 12 (SUTURE) ×1 IMPLANT
SUT VIC AB 3-0 SH 27X BRD (SUTURE) ×1 IMPLANT
SYR 20ML LL LF (SYRINGE) ×1 IMPLANT
SYR 3ML LL SCALE MARK (SYRINGE) ×1 IMPLANT
SYR TB 1ML LL NO SAFETY (SYRINGE) IMPLANT
TAG SUTURE CLAMP YLW 5PR (MISCELLANEOUS) ×1
TRAP FLUID SMOKE EVACUATOR (MISCELLANEOUS) ×1 IMPLANT
WATER STERILE IRR 500ML POUR (IV SOLUTION) ×1 IMPLANT

## 2023-02-22 NOTE — Anesthesia Procedure Notes (Signed)
 Procedure Name: LMA Insertion Date/Time: 02/22/2023 8:04 AM  Performed by: Dominica Krabbe, CRNAPre-anesthesia Checklist: Patient identified, Emergency Drugs available, Suction available, Patient being monitored and Timeout performed Patient Re-evaluated:Patient Re-evaluated prior to induction Oxygen Delivery Method: Circle system utilized Preoxygenation: Pre-oxygenation with 100% oxygen Induction Type: IV induction LMA: LMA inserted LMA Size: 4.5 Tube type: Oral Number of attempts: 1 Placement Confirmation: positive ETCO2 and breath sounds checked- equal and bilateral Tube secured with: Tape Dental Injury: Teeth and Oropharynx as per pre-operative assessment

## 2023-02-22 NOTE — Op Note (Signed)
 [facility]   OPERATIVE NOTE   PROCEDURE: Left brachiocephalic arteriovenous fistula placement  PRE-OPERATIVE DIAGNOSIS: 1. CKD stage 4  POST-OPERATIVE DIAGNOSIS: 1. CKD stage 4  SURGEON: Selinda Gu, MD  ASSISTANT(S): none  ANESTHESIA: general  ESTIMATED BLOOD LOSS: 10 cc  FINDING(S): Adequate cephalic vein for fistula creation  SPECIMEN(S):  none  INDICATIONS:   Cheryl Barr is a 68 y.o. female who presents with renal failure in need of pemanent dialysis acces.  The patient is scheduled for left arm AVF placement.  The patient is aware the risks include but are not limited to: bleeding, infection, steal syndrome, nerve damage, ischemic monomelic neuropathy, failure to mature, and need for additional procedures.  The patient is aware of the risks of the procedure and elects to proceed forward.  DESCRIPTION: After full informed written consent was obtained from the patient, the patient was brought back to the operating room and placed supine upon the operating table.  Prior to induction, the patient received IV antibiotics.   After obtaining adequate anesthesia, the patient was then prepped and draped in the standard fashion for a left arm access procedure.  I made a curvilinear incision at the level of the antecubital fossa and dissected through the subcutaneous tissue and fascia to gain exposure of the brachial artery.  This was noted to be patent and adequate in size for fistula creation.  This was dissected out proximally and distally and prepared for control with vessel loops .  I then dissected out the cephalic vein.  This was noted to be patent and adequate in size for fistula creation.  I then gave the patient 3000 units of intravenous heparin .  The vein was marked for orientation and the distal segment of the vein was ligated with a  2-0 silk, and the vein was transected.  I then instilled the heparinized saline into the vein and clamped it.  At this  point, I reset my exposure of the brachial artery and pulled up control on the vessel loops.  I made an arteriotomy with a #11 blade, and then I extended the arteriotomy with a Potts scissor.  I injected heparinized saline proximal and distal to this arteriotomy.  The vein was then sewn to the artery in an end-to-side configuration with a running stitch of 6-0 Prolene.  Prior to completing this anastomosis, I allowed the vein and artery to backbleed.  There was no evidence of clot from any vessels.  I completed the anastomosis in the usual fashion and then released all vessel loops and clamps.  There was a palpable  thrill in the venous outflow, and there was a palpable pulse in the artery distal to the anastomosis.  At this point, I irrigated out the surgical wound.  Surgicel was placed. There was no further active bleeding.  The subcutaneous tissue was reapproximated with a running stitch of 3-0 Vicryl.  The skin was then closed with a 4-0 Monocryl suture.  The skin was then cleaned, dried, and reinforced with Dermabond.  The patient tolerated this procedure well and was taken to the recovery room in stable condition  COMPLICATIONS: None  CONDITION: Stable   Selinda Gu    02/22/2023, 8:59 AM  This note was created with Dragon Medical transcription system. Any errors in dictation are purely unintentional.

## 2023-02-22 NOTE — Interval H&P Note (Signed)
 History and Physical Interval Note:  02/22/2023 7:27 AM  Cheryl Barr  has presented today for surgery, with the diagnosis of ESRD.  The various methods of treatment have been discussed with the patient and family. After consideration of risks, benefits and other options for treatment, the patient has consented to  Procedure(s): ARTERIOVENOUS (AV) FISTULA CREATION (Left) as a surgical intervention.  The patient's history has been reviewed, patient examined, no change in status, stable for surgery.  I have reviewed the patient's chart and labs.  Questions were answered to the patient's satisfaction.     Garrell Flagg

## 2023-02-22 NOTE — Anesthesia Postprocedure Evaluation (Signed)
 Anesthesia Post Note  Patient: Cheryl Barr  Procedure(s) Performed: ARTERIOVENOUS (AV) FISTULA CREATION (Left: Arm Upper)  Patient location during evaluation: PACU Anesthesia Type: General Level of consciousness: awake and alert Pain management: pain level controlled Vital Signs Assessment: post-procedure vital signs reviewed and stable Respiratory status: spontaneous breathing, nonlabored ventilation, respiratory function stable and patient connected to nasal cannula oxygen Cardiovascular status: blood pressure returned to baseline and stable Postop Assessment: no apparent nausea or vomiting Anesthetic complications: no   No notable events documented.   Last Vitals:  Vitals:   02/22/23 0945 02/22/23 0956  BP: (!) 154/84 135/67  Pulse: (!) 50 (!) 57  Resp: 13 14  Temp: (!) 36.2 C (!) 36.1 C  SpO2: 99% 96%    Last Pain:  Vitals:   02/22/23 0956  TempSrc: Temporal  PainSc:                  Fairy POUR [name]

## 2023-02-22 NOTE — Transfer of Care (Signed)
 Immediate Anesthesia Transfer of Care Note  Patient: Cheryl Barr  Procedure(s) Performed: ARTERIOVENOUS (AV) FISTULA CREATION (Left: Arm Upper)  Patient Location: PACU  Anesthesia Type:General  Level of Consciousness: sedated  Airway & Oxygen Therapy: Patient Spontanous Breathing and Patient connected to face mask oxygen  Post-op Assessment: Report given to RN and Post -op Vital signs reviewed and stable  Post vital signs: Reviewed and stable  Last Vitals:  Vitals Value Taken Time  BP 93/70 02/22/23 0907  Temp 36.2 C 02/22/23 0906  Pulse 55 02/22/23 0907  Resp 6 02/22/23 0907  SpO2 100 % 02/22/23 0907    Last Pain:  Vitals:   02/22/23 0627  TempSrc: Temporal  PainSc: 0-No pain      Patients Stated Pain Goal: 0 (02/22/23 0627)  Complications: No notable events documented.

## 2023-02-22 NOTE — Anesthesia Preprocedure Evaluation (Signed)
 Anesthesia Evaluation  Patient identified by MRN, date of birth, ID band Patient awake    Reviewed: Allergy & Precautions, NPO status , Patient's Chart, lab work & pertinent test results  History of Anesthesia Complications Negative for: history of anesthetic complications  Airway Mallampati: III       Dental  (+) Partial Upper, Poor Dentition, Missing, Dental Advidsory Given   Pulmonary neg shortness of breath, neg COPD, neg recent URI, former smoker Reactive airway disease   Pulmonary exam normal        Cardiovascular hypertension, (-) angina (-) Past MI and (-) Cardiac Stents Normal cardiovascular exam(-) dysrhythmias (-) Valvular Problems/Murmurs     Neuro/Psych neg Seizures PSYCHIATRIC DISORDERS  Depression     Neuromuscular disease (fibromyalgia)    GI/Hepatic negative GI ROS, Neg liver ROS,,,  Endo/Other  diabetes, Type 2    Renal/GU Renal InsufficiencyRenal disease  negative genitourinary   Musculoskeletal negative musculoskeletal ROS (+)    Abdominal  (+) + obese  Peds negative pediatric ROS (+)  Hematology  (+) Blood dyscrasia, anemia   Anesthesia Other Findings Past Medical History: No date: Arthritis 2009: Colon polyp No date: Depression No date: Diabetes mellitus     Comment:  Type 2 No date: Fibromyalgia No date: Gout No date: Hyperlipidemia No date: Hypertension No date: Insomnia No date: Menopause No date: Vitamin D  deficiency   Reproductive/Obstetrics negative OB ROS                             Anesthesia Physical Anesthesia Plan  ASA: 3  Anesthesia Plan: General   Post-op Pain Management:    Induction: Intravenous  PONV Risk Score and Plan: Ondansetron , Dexamethasone , Midazolam  and Treatment may vary due to age or medical condition  Airway Management Planned: LMA  Additional Equipment:   Intra-op Plan:   Post-operative Plan: Extubation in  OR  Informed Consent: I have reviewed the patients History and Physical, chart, labs and discussed the procedure including the risks, benefits and alternatives for the proposed anesthesia with the patient or authorized representative who has indicated his/her understanding and acceptance.     Dental advisory given  Plan Discussed with: CRNA and Anesthesiologist  Anesthesia Plan Comments:         Anesthesia Quick Evaluation

## 2023-02-23 ENCOUNTER — Encounter: Payer: Self-pay | Admitting: Vascular Surgery

## 2023-03-29 ENCOUNTER — Other Ambulatory Visit (INDEPENDENT_AMBULATORY_CARE_PROVIDER_SITE_OTHER): Payer: Self-pay | Admitting: Vascular Surgery

## 2023-03-29 DIAGNOSIS — N186 End stage renal disease: Secondary | ICD-10-CM

## 2023-03-30 ENCOUNTER — Encounter (INDEPENDENT_AMBULATORY_CARE_PROVIDER_SITE_OTHER): Payer: Self-pay | Admitting: Nurse Practitioner

## 2023-03-30 ENCOUNTER — Ambulatory Visit (INDEPENDENT_AMBULATORY_CARE_PROVIDER_SITE_OTHER): Payer: Commercial Managed Care - PPO

## 2023-03-30 ENCOUNTER — Ambulatory Visit (INDEPENDENT_AMBULATORY_CARE_PROVIDER_SITE_OTHER): Payer: Commercial Managed Care - PPO | Admitting: Nurse Practitioner

## 2023-03-30 VITALS — BP 163/78 | HR 52 | Resp 18 | Ht 67.0 in | Wt 192.8 lb

## 2023-03-30 DIAGNOSIS — E785 Hyperlipidemia, unspecified: Secondary | ICD-10-CM

## 2023-03-30 DIAGNOSIS — N186 End stage renal disease: Secondary | ICD-10-CM

## 2023-03-30 DIAGNOSIS — I10 Essential (primary) hypertension: Secondary | ICD-10-CM

## 2023-04-02 NOTE — Progress Notes (Signed)
 Subjective:    Patient ID: Cheryl Barr, female    DOB: [birth year], 68 y.o.   MRN: [id number] Chief Complaint  Patient presents with   Follow-up    5 weeks with HDA    The patient is a 68 year old female who presents today for evaluation of her left brachiocephalic AV fistula that was created on 02/22/2023.  Currently there is no open wound.  She denies any steal syndrome like symptoms.  She currently remains not on dialysis at this time.  She does have a good thrill and bruit present.  Today she has a flow volume of 1501.  No areas of significant stenosis but there is a possible retained valve at the confluence.    Review of Systems  Cardiovascular:  Negative for leg swelling.  All other systems reviewed and are negative.      Objective:   Physical Exam Vitals reviewed.  HENT:     Head: Normocephalic.  Cardiovascular:     Rate and Rhythm: Normal rate.     Pulses: Normal pulses.  Pulmonary:     Effort: Pulmonary effort is normal.  Skin:    General: Skin is warm and dry.  Neurological:     Mental Status: She is alert and oriented to person, place, and time.  Psychiatric:        Mood and Affect: Mood normal.        Behavior: Behavior normal.        Thought Content: Thought content normal.        Judgment: Judgment normal.     BP (!) 163/78   Pulse (!) 52   Resp 18   Ht 5\' 7"  (1.702 m)   Wt 192 lb 12.8 oz (87.5 kg)   BMI 30.20 kg/m   Past Medical History:  Diagnosis Date   Arthritis    Colon polyp 04-13-07   Depression    Diabetes mellitus    Type 2   Fibromyalgia    Gout    Hyperlipidemia    Hypertension    Insomnia    Menopause    Vitamin D deficiency     Social History   Socioeconomic History   Marital status: Widowed    Spouse name: Not on file   Number of children: 3   Years of education: 12th Grade   Highest education level: Not on file  Occupational History   Occupation: kitchen prep     Employer: [employer]  Tobacco Use    Smoking status: Former    Current packs/day: 0.00    Types: Cigarettes    Quit date: 10/02/1983    Years since quitting: 39.5   Smokeless tobacco: Never  Vaping Use   Vaping status: Never Used  Substance and Sexual Activity   Alcohol use: No    Alcohol/week: 0.0 standard drinks of alcohol   Drug use: No   Sexual activity: Yes    Partners: Male    Birth control/protection: None    Comment: declined condoms   Other Topics Concern   Not on file  Social History Narrative   Widowed. Living with boyfriend    No regular exercise.   Two other brothers one adopted since 39 days old, died in April 12, 2020 with liver cancer, also has a foster brother    Social Drivers of Corporate investment banker Strain: Low Risk  (12/01/2022)   Overall Financial Resource Strain (CARDIA)    Difficulty of Paying Living Expenses: Not hard at all  Food Insecurity: No Food Insecurity (12/01/2022)   Hunger Vital Sign    Worried About Running Out of Food in the Last Year: Never true    Ran Out of Food in the Last Year: Never true  Transportation Needs: No Transportation Needs (12/01/2022)   PRAPARE - Administrator, Civil Service (Medical): No    Lack of Transportation (Non-Medical): No  Physical Activity: Inactive (12/01/2022)   Exercise Vital Sign    Days of Exercise per Week: 0 days    Minutes of Exercise per Session: 0 min  Stress: No Stress Concern Present (12/01/2022)   [employer] of Occupational Health - Occupational Stress Questionnaire    Feeling of Stress : Only a little  Social Connections: Moderately Isolated (12/01/2022)   Social Connection and Isolation Panel [NHANES]    Frequency of Communication with Friends and Family: More than three times a week    Frequency of Social Gatherings with Friends and Family: Once a week    Attends Religious Services: More than 4 times per year    Active Member of [employer] or Organizations: No    Attends Banker Meetings: Never     Marital Status: Widowed  Intimate Partner Violence: Not At Risk (12/01/2022)   Humiliation, Afraid, Rape, and Kick questionnaire    Fear of Current or Ex-Partner: No    Emotionally Abused: No    Physically Abused: No    Sexually Abused: No    Past Surgical History:  Procedure Laterality Date   AV FISTULA PLACEMENT Left 02/22/2023   Procedure: ARTERIOVENOUS (AV) FISTULA CREATION;  Surgeon: Annice Needy, MD;  Location: [facility] ORS;  Service: Vascular;  Laterality: Left;   BREAST BIOPSY Left 05/24/2013   neg   BREAST EXCISIONAL BIOPSY Left 06/21/2013   neg/ dr. Lemar Livings   BREAST SURGERY Left 2015   COLONOSCOPY  2009   1 polyp found. Dr. Smith Mince   COLONOSCOPY WITH PROPOFOL N/A 05/04/2021   Procedure: COLONOSCOPY WITH PROPOFOL;  Surgeon: Midge Minium, MD;  Location: [facility] ENDOSCOPY;  Service: Endoscopy;  Laterality: N/A;   HERNIA REPAIR  10/09/2014   Ventral/umbilical hernia repair with 10 x 10 cm Atrium mesh in preperitoneal position.   INSERTION OF MESH N/A 10/09/2014   Procedure: INSERTION OF MESH;  Surgeon: Earline Mayotte, MD;  Location: [facility] ORS;  Service: General;  Laterality: N/A;   KNEE SURGERY Left 01/14/2009   STRABISMUS SURGERY     Repair as a child   TUBAL LIGATION  1990   VENTRAL HERNIA REPAIR N/A 10/09/2014   Procedure: HERNIA REPAIR VENTRAL ADULT;  Surgeon: Earline Mayotte, MD;  Location: [facility] ORS;  Service: General;  Laterality: N/A;    Family History  Problem Relation Age of Onset   Hypertension Mother    Diabetes Mother    Heart attack Father    Coronary artery disease Father    Breast cancer Father 1   Diabetes Sister    Gout Brother    Heart disease Brother    Allergies Son        Food and Bee Stings    No Known Allergies     Latest Ref Rng & Units 02/13/2023    9:53 AM 11/21/2019   12:07 PM 05/24/2019    7:54 AM  CBC  WBC 4.0 - 10.5 K/uL 5.0  6.3  6.3   Hemoglobin 12.0 - 15.0 g/dL 8.8  16.1  09.6   Hematocrit 36.0 - 46.0 % 28.3  32.0  31.6   Platelets 150  - 400 K/uL 310  320  325       CMP     Component Value Date/Time   NA 142 02/13/2023 0953   NA 141 06/17/2013 0941   K 3.4 (L) 02/13/2023 0953   K 3.1 (L) 06/17/2013 0941   CL 105 02/13/2023 0953   CL 104 06/17/2013 0941   CO2 26 02/13/2023 0953   CO2 30 06/17/2013 0941   GLUCOSE 94 02/13/2023 0953   GLUCOSE 137 (H) 06/17/2013 0941   BUN 52 (H) 02/13/2023 0953   BUN 18 06/17/2013 0941   CREATININE 3.17 (H) 02/13/2023 0953   CREATININE 2.11 (H) 11/21/2019 1207   CALCIUM 9.2 02/13/2023 0953   CALCIUM 9.6 06/17/2013 0941   PROT 7.6 03/07/2022 1418   ALBUMIN 3.8 03/27/2019 0051   AST 10 03/07/2022 1418   ALT 10 03/07/2022 1418   ALKPHOS 79 03/27/2019 0051   BILITOT 0.3 03/07/2022 1418   GFRNONAA 15 (L) 02/13/2023 0953   GFRNONAA 24 (L) 11/21/2019 1207     No results found.     Assessment & Plan:   1. End stage kidney disease (HCC) (Primary) The patient has a very good flow volume however there is an area that notes that there may be some retained valves.  She has not yet begun dialysis and does not have any clear timeframe at this juncture.  Because of this we will not plan on any intervention, because of fistulogram utilize contrast dye which would put her closer to dialysis.  Will plan to have her return in 3 months or sooner if she finds that she needs to begin dialysis.  2. Hypertension, essential, benign Continue antihypertensive medications as already ordered, these medications have been reviewed and there are no changes at this time.  3. Dyslipidemia Continue statin as ordered and reviewed, no changes at this time   Current Outpatient Medications on File Prior to Visit  Medication Sig Dispense Refill   amLODipine-olmesartan (AZOR) 10-40 MG tablet Take 1 tablet by mouth daily. 90 tablet 1   aspirin 81 MG EC tablet Take 81 mg by mouth daily.     calcitRIOL (ROCALTROL) 0.25 MCG capsule Take 0.25 mcg by mouth daily.     carvedilol (COREG) 12.5 MG tablet Take 1  tablet (12.5 mg total) by mouth 2 (two) times daily with a meal. 180 tablet 1   DULoxetine (CYMBALTA) 60 MG capsule Take 1 capsule (60 mg total) by mouth daily. 90 capsule 1   febuxostat (ULORIC) 40 MG tablet TAKE 1 TABLET BY MOUTH ONCE DAILY FOR  GOUT  (TAKE  IN  THE  PLACE  OF  ALLOPURINOL) 90 tablet 1   ferrous gluconate (FERGON) 324 MG tablet Take 324 mg by mouth daily with breakfast.     glipiZIDE (GLUCOTROL XL) 5 MG 24 hr tablet Take 5 mg by mouth daily with breakfast.     HYDROcodone-acetaminophen (NORCO/VICODIN) 5-325 MG tablet Take 2 tablets by mouth every 6 (six) hours as needed for moderate pain (pain score 4-6). 20 tablet 0   pioglitazone (ACTOS) 15 MG tablet Take 1 tablet (15 mg total) by mouth daily. 90 tablet 1   rosuvastatin (CRESTOR) 40 MG tablet Take 1 tablet (40 mg total) by mouth daily. 90 tablet 1   sitaGLIPtin (JANUVIA) 25 MG tablet Take 25 mg by mouth daily.     Vitamin D, Ergocalciferol, (DRISDOL) 1.25 MG (50000 UNIT) CAPS capsule Take 1 capsule (50,000 Units total) by  mouth once a week. 12 capsule 1   conjugated estrogens (PREMARIN) vaginal cream Apply dime-sized amount to urethra three times weekly. (Patient not taking: Reported on 03/30/2023) 42.5 g 3   Fluticasone-Umeclidin-Vilant (TRELEGY ELLIPTA) 100-62.5-25 MCG/ACT AEPB Inhale 1 puff into the lungs daily. (Patient not taking: Reported on 02/13/2023) 1 each 01   No current facility-administered medications on file prior to visit.    There are no Patient Instructions on file for this visit. No follow-ups on file.   Georgiana Spinner, NP

## 2023-06-08 ENCOUNTER — Other Ambulatory Visit: Payer: Self-pay | Admitting: Family Medicine

## 2023-06-08 DIAGNOSIS — Z1231 Encounter for screening mammogram for malignant neoplasm of breast: Secondary | ICD-10-CM

## 2023-06-12 ENCOUNTER — Other Ambulatory Visit: Payer: Self-pay | Admitting: Family Medicine

## 2023-06-12 DIAGNOSIS — E559 Vitamin D deficiency, unspecified: Secondary | ICD-10-CM

## 2023-06-23 ENCOUNTER — Other Ambulatory Visit (INDEPENDENT_AMBULATORY_CARE_PROVIDER_SITE_OTHER): Payer: Self-pay | Admitting: Vascular Surgery

## 2023-06-23 DIAGNOSIS — N186 End stage renal disease: Secondary | ICD-10-CM

## 2023-06-26 ENCOUNTER — Ambulatory Visit
Admission: RE | Admit: 2023-06-26 | Discharge: 2023-06-26 | Disposition: A | Discharge status: Home / Self Care | Source: Ambulatory Visit | Attending: Family Medicine | Admitting: Family Medicine

## 2023-06-26 DIAGNOSIS — Z1231 Encounter for screening mammogram for malignant neoplasm of breast: Secondary | ICD-10-CM | POA: Diagnosis present

## 2023-06-27 ENCOUNTER — Ambulatory Visit (INDEPENDENT_AMBULATORY_CARE_PROVIDER_SITE_OTHER): Payer: Commercial Managed Care - PPO | Admitting: Nurse Practitioner

## 2023-06-27 ENCOUNTER — Ambulatory Visit (INDEPENDENT_AMBULATORY_CARE_PROVIDER_SITE_OTHER): Payer: Medicare Other

## 2023-06-27 VITALS — BP 137/74 | HR 67 | Resp 16 | Ht 67.0 in | Wt 195.4 lb

## 2023-06-27 DIAGNOSIS — I10 Essential (primary) hypertension: Secondary | ICD-10-CM | POA: Diagnosis not present

## 2023-06-27 DIAGNOSIS — N186 End stage renal disease: Secondary | ICD-10-CM

## 2023-06-27 DIAGNOSIS — E785 Hyperlipidemia, unspecified: Secondary | ICD-10-CM

## 2023-07-02 ENCOUNTER — Encounter (INDEPENDENT_AMBULATORY_CARE_PROVIDER_SITE_OTHER): Payer: Self-pay | Admitting: Nurse Practitioner

## 2023-07-02 NOTE — Progress Notes (Signed)
 Subjective:    Patient ID: Cheryl Barr, female    DOB: 09-Jul-1955, 68 y.o.   MRN: [id number] Chief Complaint  Patient presents with   Venous Insufficiency    The patient is a 68 year old female who presents today for evaluation of her left brachiocephalic AV fistula that was created on 02/22/2023.  Currently there is no open wound.  She denies any steal syndrome like symptoms.  She currently remains not on dialysis at this time.  She does have a good thrill and bruit present.  Today she has a flow volume of 07/21/22.  No areas of stenosis have been noted compared to previous studies.    Review of Systems  Cardiovascular:  Negative for leg swelling.  All other systems reviewed and are negative.      Objective:   Physical Exam Vitals reviewed.  HENT:     Head: Normocephalic.  Cardiovascular:     Rate and Rhythm: Normal rate.     Pulses: Normal pulses.  Pulmonary:     Effort: Pulmonary effort is normal.  Skin:    General: Skin is warm and dry.  Neurological:     Mental Status: She is alert and oriented to person, place, and time.  Psychiatric:        Mood and Affect: Mood normal.        Behavior: Behavior normal.        Thought Content: Thought content normal.        Judgment: Judgment normal.     BP 137/74 (BP Location: Right Arm, Patient Position: Sitting, Cuff Size: Large)   Pulse 67   Resp 16   Ht 5\' 7"  (1.702 m)   Wt 195 lb 6.4 oz (88.6 kg)   BMI 30.60 kg/m   Past Medical History:  Diagnosis Date   Arthritis    Colon polyp 07/21/07   Depression    Diabetes mellitus    Type 2   Fibromyalgia    Gout    Hyperlipidemia    Hypertension    Insomnia    Menopause    Vitamin D  deficiency     Social History   Socioeconomic History   Marital status: Widowed    Spouse name: Not on file   Number of children: 3   Years of education: 12th Grade   Highest education level: Not on file  Occupational History   Occupation: kitchen prep     Employer: [employer]  Tobacco Use   Smoking status: Former    Current packs/day: 0.00    Types: Cigarettes    Quit date: 10/02/1983    Years since quitting: 39.7   Smokeless tobacco: Never  Vaping Use   Vaping status: Never Used  Substance and Sexual Activity   Alcohol use: No    Alcohol/week: 0.0 standard drinks of alcohol   Drug use: No   Sexual activity: Yes    Partners: Male    Birth control/protection: None    Comment: declined condoms   Other Topics Concern   Not on file  Social History Narrative   Widowed. Living with boyfriend    No regular exercise.   Two other brothers one adopted since 34 days old, died in 07-20-20 with liver cancer, also has a foster brother    Social Drivers of Corporate investment banker Strain: Low Risk  (05/02/2023)   [facility]   Overall Financial Resource Strain (CARDIA)    Difficulty of Paying Living  Expenses: Not hard at all  Food Insecurity: No Food Insecurity (05/02/2023)   [facility] from [facility]   Hunger Vital Sign    Worried About Running Out of Food in the Last Year: Never true    Ran Out of Food in the Last Year: Never true  Transportation Needs: No Transportation Needs (05/02/2023)   Received from [facility] - Transportation    In the past 12 months, has lack of transportation kept you from medical appointments or from getting medications?: No    Lack of Transportation (Non-Medical): No  Physical Activity: Inactive (12/01/2022)   Exercise Vital Sign    Days of Exercise per Week: 0 days    Minutes of Exercise per Session: 0 min  Stress: No Stress Concern Present (12/01/2022)   [employer] of Occupational Health - Occupational Stress Questionnaire    Feeling of Stress : Only a little  Social Connections: Moderately Isolated (12/01/2022)   Social Connection and Isolation Panel [NHANES]    Frequency of Communication with Friends and Family: More than three  times a week    Frequency of Social Gatherings with Friends and Family: Once a week    Attends Religious Services: More than 4 times per year    Active Member of [employer] or Organizations: No    Attends Banker Meetings: Never    Marital Status: Widowed  Intimate Partner Violence: Not At Risk (12/01/2022)   Humiliation, Afraid, Rape, and Kick questionnaire    Fear of Current or Ex-Partner: No    Emotionally Abused: No    Physically Abused: No    Sexually Abused: No    Past Surgical History:  Procedure Laterality Date   AV FISTULA PLACEMENT Left 02/22/2023   Procedure: ARTERIOVENOUS (AV) FISTULA CREATION;  Surgeon: Celso College, MD;  Location: [facility] ORS;  Service: Vascular;  Laterality: Left;   BREAST BIOPSY Left 05/24/2013   neg   BREAST EXCISIONAL BIOPSY Left 06/21/2013   neg/ dr. Marquita Situ   BREAST SURGERY Left 2015   COLONOSCOPY  2009   1 polyp found. Dr. Tere Felts   COLONOSCOPY WITH PROPOFOL  N/A 05/04/2021   Procedure: COLONOSCOPY WITH PROPOFOL ;  Surgeon: Marnee Sink, MD;  Location: [facility] ENDOSCOPY;  Service: Endoscopy;  Laterality: N/A;   HERNIA REPAIR  10/09/2014   Ventral/umbilical hernia repair with 10 x 10 cm Atrium mesh in preperitoneal position.   INSERTION OF MESH N/A 10/09/2014   Procedure: INSERTION OF MESH;  Surgeon: Marshall Skeeter, MD;  Location: [facility] ORS;  Service: General;  Laterality: N/A;   KNEE SURGERY Left 01/14/2009   STRABISMUS SURGERY     Repair as a child   TUBAL LIGATION  1990   VENTRAL HERNIA REPAIR N/A 10/09/2014   Procedure: HERNIA REPAIR VENTRAL ADULT;  Surgeon: Marshall Skeeter, MD;  Location: [facility] ORS;  Service: General;  Laterality: N/A;    Family History  Problem Relation Age of Onset   Hypertension Mother    Diabetes Mother    Heart attack Father    Coronary artery disease Father    Breast cancer Father 27   Diabetes Sister    Gout Brother    Heart disease Brother    Allergies Son        Food and Bee Stings    No Known  Allergies     Latest Ref Rng & Units 02/13/2023    9:53 AM 11/21/2019   12:07 PM 05/24/2019    7:54  AM  CBC  WBC 4.0 - 10.5 K/uL 5.0  6.3  6.3   Hemoglobin 12.0 - 15.0 g/dL 8.8  16.1  09.6   Hematocrit 36.0 - 46.0 % 28.3  32.0  31.6   Platelets 150 - 400 K/uL 310  320  325       CMP     Component Value Date/Time   NA 142 02/13/2023 0953   NA 141 06/17/2013 0941   K 3.4 (L) 02/13/2023 0953   K 3.1 (L) 06/17/2013 0941   CL 105 02/13/2023 0953   CL 104 06/17/2013 0941   CO2 26 02/13/2023 0953   CO2 30 06/17/2013 0941   GLUCOSE 94 02/13/2023 0953   GLUCOSE 137 (H) 06/17/2013 0941   BUN 52 (H) 02/13/2023 0953   BUN 18 06/17/2013 0941   CREATININE 3.17 (H) 02/13/2023 0953   CREATININE 2.11 (H) 11/21/2019 1207   CALCIUM  9.2 02/13/2023 0953   CALCIUM  9.6 06/17/2013 0941   PROT 7.6 03/07/2022 1418   ALBUMIN 3.8 03/27/2019 0051   AST 10 03/07/2022 1418   ALT 10 03/07/2022 1418   ALKPHOS 79 03/27/2019 0051   BILITOT 0.3 03/07/2022 1418   GFRNONAA 15 (L) 02/13/2023 0953   GFRNONAA 24 (L) 11/21/2019 1207     No results found.     Assessment & Plan:   1. End stage kidney disease (HCC) (Primary) The patient has a very good flow volume and previously noted stenosis has resolved.  She still not yet on dialysis.  At this time we will continue to follow-up on a 58-month basis  2. Hypertension, essential, benign Continue antihypertensive medications as already ordered, these medications have been reviewed and there are no changes at this time.  3. Dyslipidemia Continue statin as ordered and reviewed, no changes at this time   Current Outpatient Medications on File Prior to Visit  Medication Sig Dispense Refill   amLODipine -olmesartan  (AZOR ) 10-40 MG tablet Take 1 tablet by mouth daily. 90 tablet 1   aspirin 81 MG EC tablet Take 81 mg by mouth daily.     calcitRIOL (ROCALTROL) 0.25 MCG capsule Take 0.25 mcg by mouth daily.     carvedilol  (COREG ) 12.5 MG tablet Take 1 tablet  (12.5 mg total) by mouth 2 (two) times daily with a meal. 180 tablet 1   DULoxetine  (CYMBALTA ) 60 MG capsule Take 1 capsule (60 mg total) by mouth daily. 90 capsule 1   estrogen, conjugated,-medroxyprogesterone (PREMPRO) 0.625-5 MG tablet PREMPRO 0.625-5 MG TABS     ferrous gluconate (FERGON) 324 MG tablet Take 324 mg by mouth daily with breakfast.     Fluticasone -Umeclidin-Vilant (TRELEGY ELLIPTA ) 100-62.5-25 MCG/ACT AEPB Inhale 1 puff into the lungs daily. 1 each 01   gabapentin  (NEURONTIN ) 100 MG capsule Take 100 mg by mouth.     glipiZIDE  (GLUCOTROL  XL) 5 MG 24 hr tablet Take 5 mg by mouth daily with breakfast.     naproxen sodium (ALEVE) 220 MG tablet ALEVE 220 MG TABS     pioglitazone  (ACTOS ) 15 MG tablet Take 1 tablet (15 mg total) by mouth daily. 90 tablet 1   rosuvastatin  (CRESTOR ) 40 MG tablet Take 1 tablet (40 mg total) by mouth daily. 90 tablet 1   sitaGLIPtin  (JANUVIA ) 25 MG tablet Take 25 mg by mouth daily.     Vitamin D , Ergocalciferol , (DRISDOL ) 1.25 MG (50000 UNIT) CAPS capsule Take 1 capsule (50,000 Units total) by mouth once a week. 12 capsule 1   conjugated estrogens  (PREMARIN ) vaginal  cream Apply dime-sized amount to urethra three times weekly. (Patient not taking: Reported on 02/09/2023) 42.5 g 3   febuxostat  (ULORIC ) 40 MG tablet TAKE 1 TABLET BY MOUTH ONCE DAILY FOR  GOUT  (TAKE  IN  THE  PLACE  OF  ALLOPURINOL ) (Patient not taking: Reported on 06/27/2023) 90 tablet 1   HYDROcodone -acetaminophen  (NORCO/VICODIN) 5-325 MG tablet Take 2 tablets by mouth every 6 (six) hours as needed for moderate pain (pain score 4-6). (Patient not taking: Reported on 06/27/2023) 20 tablet 0   No current facility-administered medications on file prior to visit.    There are no Patient Instructions on file for this visit. No follow-ups on file.   Orpha Dain E Chrisoula Zegarra, NP

## 2023-08-02 ENCOUNTER — Ambulatory Visit
Admission: RE | Admit: 2023-08-02 | Discharge: 2023-08-02 | Disposition: A | Discharge status: Home / Self Care | Source: Ambulatory Visit | Attending: Nephrology | Admitting: Nephrology

## 2023-08-02 DIAGNOSIS — D509 Iron deficiency anemia, unspecified: Secondary | ICD-10-CM | POA: Diagnosis present

## 2023-08-02 MED ORDER — IRON SUCROSE 200 MG IVPB - SIMPLE MED
200.0000 mg | Freq: Once | Status: AC
  Administered 2023-08-02: 200 mg via INTRAVENOUS
  Filled 2023-08-02: qty 200

## 2023-08-10 ENCOUNTER — Ambulatory Visit
Admission: RE | Admit: 2023-08-10 | Discharge: 2023-08-10 | Disposition: A | Discharge status: Home / Self Care | Source: Ambulatory Visit | Attending: Nephrology | Admitting: Nephrology

## 2023-08-10 DIAGNOSIS — N185 Chronic kidney disease, stage 5: Secondary | ICD-10-CM | POA: Diagnosis present

## 2023-08-10 DIAGNOSIS — E1122 Type 2 diabetes mellitus with diabetic chronic kidney disease: Secondary | ICD-10-CM | POA: Insufficient documentation

## 2023-08-10 DIAGNOSIS — D508 Other iron deficiency anemias: Secondary | ICD-10-CM | POA: Diagnosis not present

## 2023-08-10 DIAGNOSIS — D631 Anemia in chronic kidney disease: Secondary | ICD-10-CM | POA: Diagnosis present

## 2023-08-10 MED ORDER — IRON SUCROSE 200 MG IVPB - SIMPLE MED
200.0000 mg | Freq: Once | Status: AC
  Administered 2023-08-10: 200 mg via INTRAVENOUS
  Filled 2023-08-10: qty 200

## 2023-08-17 ENCOUNTER — Ambulatory Visit

## 2023-08-17 ENCOUNTER — Ambulatory Visit
Admission: RE | Admit: 2023-08-17 | Discharge: 2023-08-17 | Disposition: A | Discharge status: Home / Self Care | Source: Ambulatory Visit | Attending: Nephrology | Admitting: Nephrology

## 2023-08-17 DIAGNOSIS — N185 Chronic kidney disease, stage 5: Secondary | ICD-10-CM | POA: Insufficient documentation

## 2023-08-17 DIAGNOSIS — E1122 Type 2 diabetes mellitus with diabetic chronic kidney disease: Secondary | ICD-10-CM | POA: Insufficient documentation

## 2023-08-17 DIAGNOSIS — D631 Anemia in chronic kidney disease: Secondary | ICD-10-CM | POA: Diagnosis present

## 2023-08-17 MED ORDER — IRON SUCROSE 200 MG IVPB - SIMPLE MED
200.0000 mg | Freq: Once | Status: AC
  Administered 2023-08-17: 200 mg via INTRAVENOUS
  Filled 2023-08-17: qty 200

## 2023-08-22 ENCOUNTER — Ambulatory Visit: Payer: Self-pay | Admitting: Family Medicine

## 2023-09-24 ENCOUNTER — Other Ambulatory Visit: Payer: Self-pay | Admitting: Family Medicine

## 2023-09-24 DIAGNOSIS — E785 Hyperlipidemia, unspecified: Secondary | ICD-10-CM

## 2023-10-03 ENCOUNTER — Ambulatory Visit: Admitting: Family Medicine

## 2023-10-03 ENCOUNTER — Encounter: Payer: Self-pay | Admitting: Family Medicine

## 2023-10-03 VITALS — BP 102/62 | HR 73 | Resp 16 | Ht 67.0 in | Wt 197.4 lb

## 2023-10-03 DIAGNOSIS — N185 Chronic kidney disease, stage 5: Secondary | ICD-10-CM | POA: Diagnosis not present

## 2023-10-03 DIAGNOSIS — E1159 Type 2 diabetes mellitus with other circulatory complications: Secondary | ICD-10-CM

## 2023-10-03 DIAGNOSIS — J4530 Mild persistent asthma, uncomplicated: Secondary | ICD-10-CM

## 2023-10-03 DIAGNOSIS — F341 Dysthymic disorder: Secondary | ICD-10-CM

## 2023-10-03 DIAGNOSIS — N051 Unspecified nephritic syndrome with focal and segmental glomerular lesions: Secondary | ICD-10-CM

## 2023-10-03 DIAGNOSIS — I700 Atherosclerosis of aorta: Secondary | ICD-10-CM | POA: Diagnosis not present

## 2023-10-03 DIAGNOSIS — M109 Gout, unspecified: Secondary | ICD-10-CM

## 2023-10-03 DIAGNOSIS — N83202 Unspecified ovarian cyst, left side: Secondary | ICD-10-CM

## 2023-10-03 DIAGNOSIS — N2581 Secondary hyperparathyroidism of renal origin: Secondary | ICD-10-CM | POA: Diagnosis not present

## 2023-10-03 DIAGNOSIS — I152 Hypertension secondary to endocrine disorders: Secondary | ICD-10-CM

## 2023-10-03 DIAGNOSIS — G5711 Meralgia paresthetica, right lower limb: Secondary | ICD-10-CM

## 2023-10-03 LAB — POCT GLYCOSYLATED HEMOGLOBIN (HGB A1C): Hemoglobin A1C: 5.6 % (ref 4.0–5.6)

## 2023-10-03 MED ORDER — DULOXETINE HCL 60 MG PO CPEP: 60.0000 mg | ORAL_CAPSULE | Freq: Every day | ORAL | 1 refills | Status: AC

## 2023-10-03 MED ORDER — PIOGLITAZONE HCL 15 MG PO TABS: 15.0000 mg | ORAL_TABLET | Freq: Every day | ORAL | 1 refills | Status: AC

## 2023-10-03 MED ORDER — CARVEDILOL 12.5 MG PO TABS: 12.5000 mg | ORAL_TABLET | Freq: Two times a day (BID) | ORAL | 1 refills | Status: AC

## 2023-10-03 MED ORDER — GLIPIZIDE ER 2.5 MG PO TB24
2.5000 mg | ORAL_TABLET | Freq: Every day | ORAL | 1 refills | Status: AC
Start: 2023-10-03 — End: ?

## 2023-10-03 MED ORDER — AIRSUPRA 90-80 MCG/ACT IN AERO
2.0000 | INHALATION_SPRAY | Freq: Four times a day (QID) | RESPIRATORY_TRACT | 0 refills | Status: DC | PRN
Start: 2023-10-03 — End: 2024-01-03

## 2023-10-03 MED ORDER — FEBUXOSTAT 40 MG PO TABS: 40.0000 mg | ORAL_TABLET | Freq: Every day | ORAL | 1 refills | Status: AC

## 2023-10-03 MED ORDER — BENZONATATE 100 MG PO CAPS: 100.0000 mg | ORAL_CAPSULE | Freq: Two times a day (BID) | ORAL | 0 refills | Status: DC | PRN

## 2023-10-03 NOTE — Progress Notes (Signed)
 Name: Cheryl Barr   MRN: [id number]    DOB: January 29, 1956   Date:10/03/2023       Progress Note  Subjective  Chief Complaint  Chief Complaint  Patient presents with   Medical Management of Chronic Issues    Pt refused DEXA and has not done DM exam yet   Discussed the use of AI scribe software for clinical note transcription with the patient, who gave verbal consent to proceed.  History of Present Illness Cheryl Barr is a 68 year old female with diabetes, hypertension, and chronic kidney disease who presents for a regular follow-up visit.  Her blood pressure was recently recorded at 167 mmHg during a visit with her nephrologist, but today it is 102/60 mmHg. She is adherent to her antihypertensive regimen, which includes amlodipine  olmesartan  10/40 mg and carvedilol  12.5 mg twice daily. No dizziness, chest pain, or palpitations are reported.  Her diabetes is managed with pioglitazone  and glipizide  5 mg , and her A1c is 5.6. She is no longer taking Januvia . She does not experience symptoms of uncontrolled diabetes such as polyphagia or polydipsia, attributing her water intake to kidney health. Denies hypoglycemic episodes   Her chronic kidney disease has progressed to stage 5, and she is on a kidney transplant list. She is not on dialysis and continues to produce urine.   She has thoracic aortic atherosclerosis and is taking rosuvastatin  40 mg and denies side effects  She has mild persistent reactive airway disease and uses Trelegy as needed. She has no history of smoking and reports an occasional dry cough.  She experiences neuropathy and is taking duloxetine . She has discontinued gabapentin .  She has a history of ovarian cysts on the left side but has not sought further medical evaluation recently.   No cramps related to hyperparathyroidism and venous insufficiency are reported.  She experienced a fall on April 9th after tripping over a cord at work,she was seen by ortho and feeling  better      Patient Active Problem List   Diagnosis Date Noted   Ovarian cyst, left 10/03/2023   Dysthymia 10/07/2022   Meralgia paresthetica of right side 11/05/2021   Controlled type 2 diabetes mellitus with microalbuminuria, without long-term current use of insulin (HCC) 11/05/2021   History of colonic polyps    Polyp of transverse colon    End stage kidney disease (HCC) 03/08/2021   Reactive airway disease 05/20/2020   Hypertension associated with type 2 diabetes mellitus (HCC) 05/20/2020   Hyperparathyroidism due to renal insufficiency (HCC) 06/12/2019   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   Anemia in chronic kidney disease 06/12/2019   History of late syphilis 11/26/2018   Incomplete rotator cuff tear or rupture of left shoulder, not specified as traumatic 07/26/2016   Thoracic aortic atherosclerosis (HCC) 01/12/2016   DDD (degenerative disc disease), 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/2016   B12 deficiency 01/11/2016   Hernia of abdominal cavity 09/15/2014   Dyslipidemia 08/10/2014   Controlled gout 08/10/2014   Microalbuminuria 08/10/2014   Osteoarthritis of left knee 08/10/2014   Vitamin D  deficiency 08/10/2014   Intraductal papilloma of breast 06/27/2013   Hypertension, essential, benign 09/02/2009   ABNORMAL EKG 09/02/2009    Past Surgical History:  Procedure Laterality Date   AV FISTULA PLACEMENT Left 02/22/2023   Procedure: ARTERIOVENOUS (AV) FISTULA CREATION;  Surgeon: Marea Selinda RAMAN, MD;  Location: [facility] ORS;  Service: Vascular;  Laterality: Left;   BREAST BIOPSY Left 05/24/2013   neg   BREAST EXCISIONAL BIOPSY Left 06/21/2013  neg/ dr. Dessa   BREAST SURGERY Left 2015   COLONOSCOPY  2009   1 polyp found. Dr. Chapman   COLONOSCOPY WITH PROPOFOL  N/A 05/04/2021   Procedure: COLONOSCOPY WITH PROPOFOL ;  Surgeon: Jinny Carmine, MD;  Location: [facility] ENDOSCOPY;  Service: Endoscopy;  Laterality: N/A;   HERNIA REPAIR  10/09/2014   Ventral/umbilical hernia repair with 10 x 10 cm  Atrium mesh in preperitoneal position.   INSERTION OF MESH N/A 10/09/2014   Procedure: INSERTION OF MESH;  Surgeon: Reyes LELON Dessa, MD;  Location: [facility] ORS;  Service: General;  Laterality: N/A;   KNEE SURGERY Left 01/14/2009   STRABISMUS SURGERY     Repair as a child   TUBAL LIGATION  1990   VENTRAL HERNIA REPAIR N/A 10/09/2014   Procedure: HERNIA REPAIR VENTRAL ADULT;  Surgeon: Reyes LELON Dessa, MD;  Location: [facility] ORS;  Service: General;  Laterality: N/A;    Family History  Problem Relation Age of Onset   Hypertension Mother    Diabetes Mother    Heart attack Father    Coronary artery disease Father    Breast cancer Father 35   Diabetes Sister    Gout Brother    Heart disease Brother    Allergies Son        Arts administrator and Publishing rights manager    Social History   Tobacco Use   Smoking status: Former    Current packs/day: 0.00    Types: Cigarettes    Quit date: 10/02/1983    Years since quitting: 40.0   Smokeless tobacco: Never  Substance Use Topics   Alcohol use: No    Alcohol/week: 0.0 standard drinks of alcohol     Current Outpatient Medications:    amLODipine -olmesartan  (AZOR ) 10-40 MG tablet, Take 1 tablet by mouth daily., Disp: 90 tablet, Rfl: 1   aspirin 81 MG EC tablet, Take 81 mg by mouth daily., Disp: , Rfl:    calcitRIOL (ROCALTROL) 0.25 MCG capsule, Take 0.25 mcg by mouth daily., Disp: , Rfl:    carvedilol  (COREG ) 12.5 MG tablet, Take 1 tablet (12.5 mg total) by mouth 2 (two) times daily with a meal., Disp: 180 tablet, Rfl: 1   DULoxetine  (CYMBALTA ) 60 MG capsule, Take 1 capsule (60 mg total) by mouth daily., Disp: 90 capsule, Rfl: 1   estrogen, conjugated,-medroxyprogesterone (PREMPRO) 0.625-5 MG tablet, PREMPRO 0.625-5 MG TABS, Disp: , Rfl:    febuxostat  (ULORIC ) 40 MG tablet, TAKE 1 TABLET BY MOUTH ONCE DAILY FOR  GOUT  (TAKE  IN  THE  PLACE  OF  ALLOPURINOL ), Disp: 90 tablet, Rfl: 1   glipiZIDE  (GLUCOTROL  XL) 5 MG 24 hr tablet, Take 5 mg by mouth daily with  breakfast., Disp: , Rfl:    pioglitazone  (ACTOS ) 15 MG tablet, Take 1 tablet (15 mg total) by mouth daily., Disp: 90 tablet, Rfl: 1   primidone (MYSOLINE) 50 MG tablet, Take 50 mg nightly for 2 weeks. If ongoing symptoms, can increase to 75 mg nightly. Refill., Disp: , Rfl:    rosuvastatin  (CRESTOR ) 40 MG tablet, Take 1 tablet by mouth once daily, Disp: 30 tablet, Rfl: 0   ferrous gluconate (FERGON) 324 MG tablet, Take 324 mg by mouth daily with breakfast. (Patient not taking: Reported on 10/03/2023), Disp: , Rfl:    gabapentin  (NEURONTIN ) 100 MG capsule, Take 100 mg by mouth. (Patient not taking: Reported on 10/03/2023), Disp: , Rfl:   No Known Allergies  I personally reviewed active problem list, medication list, allergies with the patient/caregiver  today.   ROS  Ten systems reviewed and is negative except as mentioned in HPI    Objective Physical Exam  CONSTITUTIONAL: Patient appears well-developed and well-nourished. No distress. HEENT: Head atraumatic, normocephalic, neck supple. CARDIOVASCULAR: Normal rate, regular rhythm and normal heart sounds. No murmur heard. No BLE edema. PULMONARY: Effort normal and breath sounds normal. No respiratory distress. ABDOMINAL: There is no tenderness or distention. PSYCHIATRIC: Patient has a normal mood and affect. Behavior is normal. Judgment and thought content normal. SKIN: Thick toenails with probable fungal infection on big toe.  Vitals:   10/03/23 0934  BP: 102/62  Pulse: 73  Resp: 16  SpO2: 99%  Weight: 197 lb 6.4 oz (89.5 kg)  Height: 5' 7 (1.702 m)    Body mass index is 30.92 kg/m.  Recent Results (from the past 2160 hours)  POCT glycosylated hemoglobin (Hb A1C)     Status: None   Collection Time: 10/03/23  9:41 AM  Result Value Ref Range   Hemoglobin A1C 5.6 4.0 - 5.6 %   HbA1c POC (<> result, manual entry)     HbA1c, POC (prediabetic range)     HbA1c, POC (controlled diabetic range)      Diabetic Foot  Exam:  Diabetic foot exam was performed with the following findings:   Normal sensation of 10g monofilament Intact posterior tibialis and dorsalis pedis pulses Thick toenails      PHQ2/9:    10/03/2023    9:29 AM 02/09/2023    8:58 AM 12/01/2022    8:14 AM 10/07/2022    2:02 PM 10/07/2022    1:31 PM  Depression screen PHQ 2/9  Decreased Interest 0 0 0 1 0  Down, Depressed, Hopeless 0 0 0 1 0  PHQ - 2 Score 0 0 0 2 0  Altered sleeping 0 0 0  0  Tired, decreased energy 0 0 0 0 0  Change in appetite 0 0 0 0 0  Feeling bad or failure about yourself  0 0 0 0 0  Trouble concentrating 0 0 0 0 0  Moving slowly or fidgety/restless 0 0 0 0 0  Suicidal thoughts 0 0 0 0 0  PHQ-9 Score 0 0 0  0  Difficult doing work/chores Not difficult at all Not difficult at all  Not difficult at all     phq 9 is negative  Fall Risk:    10/03/2023    9:19 AM 02/09/2023    8:51 AM 12/01/2022    8:13 AM 10/07/2022    1:30 PM 06/06/2022    1:20 PM  Fall Risk   Falls in the past year? 1 1 1  0 0  Number falls in past yr: 0 0 1    Injury with Fall? 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 GFR 11. Secondary hyperparathyroidism ,  normal iron , low hemoglobin 8.8 g/dL. No vitamin D  supplementation. - Discuss potential need for blood transfusion with nephrologist.  Hypertension associated with CKI and DM Blood pressure low at 102/60 mmHg today  Current medications include amlodipine /olmesartan  and carvedilol . Low BP may be due to medication timing. - Advise taking amlodipine /olmesartan  in the evening. -  Schedule follow-up blood pressure check in one week.  Type 2 Diabetes Mellitus with associated dyslipidemia, HTN and obesity A1c 5.6%. Taking pioglitazone  and reduced glipizide  due to hypoglycemia concerns. No symptoms of uncontrolled diabetes. - Reduce glipizide  dose to 2.5 mg. - Continue pioglitazone . - Monitor A1c levels.  Thoracic Aortic Atherosclerosis Managed with rosuvastatin  40 mg. - Continue rosuvastatin  40 mg.  Mild Persistent Reactive Airway Disease Occasional dry cough. Previously used Trelegy inhaler. No smoking history or asthma diagnosis. - Prescribe Airsupra  inhaler as needed. - Prescribe benzonatate  for cough management.  Dysthymia  Previously prescribed duloxetine  for depression and neuropathy. Continues for neuropathy management. - Continue duloxetine  for neuropathy management.  Gout No recent gout attacks. Taking Lyrica  due to kidney failure. - Continue Lyrica  for gout management.  B12 Deficiency Previously low B12 levels, not recently checked. - Advise taking B12 supplementation a couple of times a week.  Left ovarian cyst - seen gyn and given reassurance   Follow-up - Schedule nurse visit for blood pressure check in one week. - Schedule follow-up appointment in three months. - Plan for physical exam in October, including flu shot.

## 2023-10-10 ENCOUNTER — Ambulatory Visit

## 2023-10-10 DIAGNOSIS — I152 Hypertension secondary to endocrine disorders: Secondary | ICD-10-CM

## 2023-10-10 NOTE — Progress Notes (Signed)
 Patient is in office today for a nurse visit for Blood Pressure Check. Patient blood pressure was 130/72, Patient No chest pain, No shortness of breath, No dyspnea on exertion, No orthopnea, No paroxysmal nocturnal dyspnea, No edema, No palpitations, No syncope.   Per last visit:  Hypertension associated with CKI and DM Blood pressure low at 102/60 mmHg today  Current medications include amlodipine /olmesartan  and carvedilol . Low BP may be due to medication timing. - Advise taking amlodipine /olmesartan  in the evening. - Schedule follow-up blood pressure check in one week.  Pt told to continue current regimen.

## 2023-11-06 ENCOUNTER — Other Ambulatory Visit: Payer: Self-pay | Admitting: Family Medicine

## 2023-11-06 DIAGNOSIS — E785 Hyperlipidemia, unspecified: Secondary | ICD-10-CM

## 2023-11-30 NOTE — Patient Instructions (Incomplete)
 Preventive Care 83 Years and Older, Female Preventive care refers to lifestyle choices and visits with your health care provider that can promote health and wellness. Preventive care visits are also called wellness exams. What can I expect for my preventive care visit? Counseling Your health care provider may ask you questions about your: Medical history, including: Past medical problems. Family medical history. Pregnancy and menstrual history. History of falls. Current health, including: Memory and ability to understand (cognition). Emotional well-being. Home life and relationship well-being. Sexual activity and sexual health. Lifestyle, including: Alcohol, nicotine or tobacco, and drug use. Access to firearms. Diet, exercise, and sleep habits. Work and work Astronomer. Sunscreen use. Safety issues such as seatbelt and bike helmet use. Physical exam Your health care provider will check your: Height and weight. These may be used to calculate your BMI (body mass index). BMI is a measurement that tells if you are at a healthy weight. Waist circumference. This measures the distance around your waistline. This measurement also tells if you are at a healthy weight and may help predict your risk of certain diseases, such as type 2 diabetes and high blood pressure. Heart rate and blood pressure. Body temperature. Skin for abnormal spots. What immunizations do I need?  Vaccines are usually given at various ages, according to a schedule. Your health care provider will recommend vaccines for you based on your age, medical history, and lifestyle or other factors, such as travel or where you work. What tests do I need? Screening Your health care provider may recommend screening tests for certain conditions. This may include: Lipid and cholesterol levels. Hepatitis C test. Hepatitis B test. HIV (human immunodeficiency virus) test. STI (sexually transmitted infection) testing, if you are at  risk. Lung cancer screening. Colorectal cancer screening. Diabetes screening. This is done by checking your blood sugar (glucose) after you have not eaten for a while (fasting). Mammogram. Talk with your health care provider about how often you should have regular mammograms. BRCA-related cancer screening. This may be done if you have a family history of breast, ovarian, tubal, or peritoneal cancers. Bone density scan. This is done to screen for osteoporosis. Talk with your health care provider about your test results, treatment options, and if necessary, the need for more tests. Follow these instructions at home: Eating and drinking  Eat a diet that includes fresh fruits and vegetables, whole grains, lean protein, and low-fat dairy products. Limit your intake of foods with high amounts of sugar, saturated fats, and salt. Take vitamin and mineral supplements as recommended by your health care provider. Do not drink alcohol if your health care provider tells you not to drink. If you drink alcohol: Limit how much you have to 0-1 drink a day. Know how much alcohol is in your drink. In the [place] one drink equals one 12 oz bottle of beer (355 mL), one 5 oz glass of wine (148 mL), or one 1 oz glass of hard liquor (44 mL). Lifestyle Brush your teeth every morning and night with fluoride toothpaste. Floss one time each day. Exercise for at least 30 minutes 5 or more days each week. Do not use any products that contain nicotine or tobacco. These products include cigarettes, chewing tobacco, and vaping devices, such as e-cigarettes. If you need help quitting, ask your health care provider. Do not use drugs. If you are sexually active, practice safe sex. Use a condom or other form of protection in order to prevent STIs. Take aspirin only as told by  your health care provider. Make sure that you understand how much to take and what form to take. Work with your health care provider to find out whether it  is safe and beneficial for you to take aspirin daily. Ask your health care provider if you need to take a cholesterol-lowering medicine (statin). Find healthy ways to manage stress, such as: Meditation, yoga, or listening to music. Journaling. Talking to a trusted person. Spending time with friends and family. Minimize exposure to UV radiation to reduce your risk of skin cancer. Safety Always wear your seat belt while driving or riding in a vehicle. Do not drive: If you have been drinking alcohol. Do not ride with someone who has been drinking. When you are tired or distracted. While texting. If you have been using any mind-altering substances or drugs. Wear a helmet and other protective equipment during sports activities. If you have firearms in your house, make sure you follow all gun safety procedures. What's next? Visit your health care provider once a year for an annual wellness visit. Ask your health care provider how often you should have your eyes and teeth checked. Stay up to date on all vaccines. This information is not intended to replace advice given to you by your health care provider. Make sure you discuss any questions you have with your health care provider. Document Revised: 07/29/2020 Document Reviewed: 07/29/2020 Elsevier Patient Education  2024 [employer].

## 2023-12-04 ENCOUNTER — Encounter: Payer: Self-pay | Admitting: Family Medicine

## 2023-12-27 ENCOUNTER — Other Ambulatory Visit (INDEPENDENT_AMBULATORY_CARE_PROVIDER_SITE_OTHER): Payer: Self-pay | Admitting: Nurse Practitioner

## 2023-12-27 DIAGNOSIS — N186 End stage renal disease: Secondary | ICD-10-CM

## 2023-12-28 ENCOUNTER — Ambulatory Visit (INDEPENDENT_AMBULATORY_CARE_PROVIDER_SITE_OTHER): Admitting: Nurse Practitioner

## 2023-12-28 ENCOUNTER — Encounter (INDEPENDENT_AMBULATORY_CARE_PROVIDER_SITE_OTHER)

## 2024-01-03 ENCOUNTER — Ambulatory Visit: Admitting: Family Medicine

## 2024-01-03 ENCOUNTER — Encounter: Payer: Self-pay | Admitting: Family Medicine

## 2024-01-03 VITALS — BP 122/76 | HR 79 | Temp 98.0°F | Resp 16 | Ht 67.0 in | Wt 197.3 lb

## 2024-01-03 DIAGNOSIS — I152 Hypertension secondary to endocrine disorders: Secondary | ICD-10-CM | POA: Diagnosis not present

## 2024-01-03 DIAGNOSIS — E1169 Type 2 diabetes mellitus with other specified complication: Secondary | ICD-10-CM

## 2024-01-03 DIAGNOSIS — I700 Atherosclerosis of aorta: Secondary | ICD-10-CM

## 2024-01-03 DIAGNOSIS — J410 Simple chronic bronchitis: Secondary | ICD-10-CM | POA: Diagnosis not present

## 2024-01-03 DIAGNOSIS — E1159 Type 2 diabetes mellitus with other circulatory complications: Secondary | ICD-10-CM

## 2024-01-03 DIAGNOSIS — N185 Chronic kidney disease, stage 5: Secondary | ICD-10-CM

## 2024-01-03 DIAGNOSIS — M109 Gout, unspecified: Secondary | ICD-10-CM

## 2024-01-03 DIAGNOSIS — N051 Unspecified nephritic syndrome with focal and segmental glomerular lesions: Secondary | ICD-10-CM

## 2024-01-03 DIAGNOSIS — E785 Hyperlipidemia, unspecified: Secondary | ICD-10-CM

## 2024-01-03 DIAGNOSIS — J4530 Mild persistent asthma, uncomplicated: Secondary | ICD-10-CM

## 2024-01-03 DIAGNOSIS — R251 Tremor, unspecified: Secondary | ICD-10-CM

## 2024-01-03 DIAGNOSIS — N2581 Secondary hyperparathyroidism of renal origin: Secondary | ICD-10-CM

## 2024-01-03 LAB — POCT GLYCOSYLATED HEMOGLOBIN (HGB A1C): Hemoglobin A1C: 5.7 % — AB (ref 4.0–5.6)

## 2024-01-03 MED ORDER — ROSUVASTATIN CALCIUM 40 MG PO TABS: 40.0000 mg | ORAL_TABLET | Freq: Every day | ORAL | 1 refills | Status: AC

## 2024-01-03 MED ORDER — BUDESONIDE-FORMOTEROL FUMARATE 160-4.5 MCG/ACT IN AERO: 2.0000 | INHALATION_SPRAY | Freq: Two times a day (BID) | RESPIRATORY_TRACT | 1 refills | Status: AC

## 2024-01-03 MED ORDER — PRIMIDONE 50 MG PO TABS: 75.0000 mg | ORAL_TABLET | Freq: Every day | ORAL | 0 refills | Status: AC

## 2024-01-03 MED ORDER — AMLODIPINE-OLMESARTAN 10-40 MG PO TABS: 1.0000 | ORAL_TABLET | Freq: Every day | ORAL | 1 refills | Status: AC

## 2024-01-03 NOTE — Progress Notes (Signed)
 Name: Cheryl Barr   MRN: [id number]    DOB: Oct 17, 1955   Date:01/03/2024       Progress Note  Subjective  Chief Complaint  Chief Complaint  Patient presents with   Medical Management of Chronic Issues   Discussed the use of AI scribe software for clinical note transcription with the patient, who gave verbal consent to proceed.  History of Present Illness Felise Georgia is a 68 year old female with type 2 diabetes, hypertension, chronic kidney disease, dyslipidemia, gout, reactive airway disease, chronic bronchitis, depression, and tremors who presents for a four-month follow-up visit.  Her type 2 diabetes is well-controlled with an A1c of 5.7. She takes glipizide  XL 2.5 mg and Actos  15 mg  daily. She She does not regularly check her blood sugar levels and has no symptoms of hypoglycemia.  She has hypertension and is currently taking carvedilol , amlodipine , and olmesartan . She recently received a new bottle of carvedilol .  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 Her kidney function is currently at 9%, and she is not on dialysis. She experiences nocturia, urinating approximately three times per night.  Her dyslipidemia is managed with rosuvastatin  40 mg daily. She reports no muscle aches associated with this medication. Her last lipid panel showed an LDL level of 74.  She has a history of gout, which is currently controlled, and mild persistent reactive airway disease. She has a persistent cough and has used Airsupra  inhaler in the past.  She has chronic bronchitis and a history of smoking, which may contribute to her persistent cough and phlegm production. She was out of work for a week recently due to a cold.  She takes duloxetine  for depression and primidone for tremors and nerves, which she takes at night. She is currently out of primidone we will send a refill until she sees neurologist next week      Patient Active Problem List   Diagnosis Date Noted   Ovarian cyst, left 10/03/2023   Dysthymia 10/07/2022   Meralgia paresthetica of right side 11/05/2021   Controlled type 2 diabetes mellitus with microalbuminuria, without long-term current use of insulin (HCC) 11/05/2021   History of colonic polyps    Polyp of transverse colon    End stage kidney disease (HCC) 03/08/2021   Reactive airway disease 05/20/2020   Hypertension associated with type 2 diabetes mellitus (HCC) 05/20/2020   Hyperparathyroidism due to renal insufficiency 06/12/2019   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   Anemia in chronic kidney disease 06/12/2019   History of late syphilis 11/26/2018   Incomplete rotator cuff tear or rupture of left shoulder, not specified as traumatic 07/26/2016   Thoracic aortic atherosclerosis 01/12/2016   DDD (degenerative disc disease), 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/2016   B12 deficiency 01/11/2016   Hernia of abdominal cavity 09/15/2014   Dyslipidemia 08/10/2014   Controlled gout 08/10/2014   Microalbuminuria 08/10/2014   Osteoarthritis of left knee 08/10/2014   Vitamin D  deficiency 08/10/2014   Intraductal papilloma of breast 06/27/2013   Hypertension, essential, benign 09/02/2009   ABNORMAL EKG 09/02/2009    Past Surgical History:  Procedure Laterality Date   AV FISTULA PLACEMENT Left 02/22/2023   Procedure: ARTERIOVENOUS (AV) FISTULA CREATION;  Surgeon: Marea Selinda RAMAN, MD;  Location: [facility] ORS;  Service: Vascular;  Laterality: Left;   BREAST BIOPSY Left 05/24/2013   neg   BREAST EXCISIONAL BIOPSY Left 06/21/2013   neg/ dr. Dessa   BREAST SURGERY Left 2015  COLONOSCOPY  2009   1 polyp found. Dr. Chapman   COLONOSCOPY WITH PROPOFOL  N/A 05/04/2021   Procedure: COLONOSCOPY WITH PROPOFOL ;  Surgeon: Jinny Carmine, MD;  Location[facility] END[facility]COPY;  Service: Endoscopy;  Laterality: N/A;   HERNIA REPAIR  10/09/2014   Ventral/umbilical hernia repair with 10 x 10 cm At[facility] mesh in preperitoneal  position.   INSERTION OF MESH N/A 10/09/2014   Procedure: INSERTION OF MESH;  Surgeon: Reyes LELON Cota, MD;  Location: [facility] ORS;  Service: General;  Laterality: N/A;   KNEE SURGERY Left 01/14/2009   STRABISMUS SURGERY     Repair as a child   TUBAL LIGATION  1990   VENTRAL HERNIA REPAIR N/A 10/09/2014   Procedure: HERNIA REPAIR VENTRAL ADULT;  Surgeon: Reyes LELON Cota, MD;  Location: [facility] ORS;  Service: General;  Laterality: N/A;    Family History  Problem Relation Age of Onset   Hypertension Mother    Diabetes Mother    Heart attack Father    Coronary artery disease Father    Breast cancer Father 26   Diabetes Sister    Gout Brother    Heart disease Brother    Allergies Son        Arts Administrator and Publishing Rights Manager    Social History   Tobacco Use   Smoking status: Former    Current packs/day: 0.00    Types: Cigarettes    Quit date: 10/02/1983    Years since quitting: 40.2   Smokeless tobacco: Never  Substance Use Topics   Alcohol use: No    Alcohol/week: 0.0 standard drinks of alcohol     Current Outpatient Medications:    amLODipine -olmesartan  (AZOR ) 10-40 MG tablet, Take 1 tablet by mouth daily., Disp: 90 tablet, Rfl: 1   aspirin 81 MG EC tablet, Take 81 mg by mouth daily., Disp: , Rfl:    benzonatate  (TESSALON ) 100 MG capsule, Take 1 capsule (100 mg total) by mouth 2 (two) times daily as needed for cough., Disp: 40 capsule, Rfl: 0   calcitRIOL (ROCALTROL) 0.25 MCG capsule, Take 0.25 mcg by mouth daily., Disp: , Rfl:    carvedilol  (COREG ) 12.5 MG tablet, Take 1 tablet (12.5 mg total) by mouth 2 (two) times daily with a meal., Disp: 180 tablet, Rfl: 1   DULoxetine  (CYMBALTA ) 60 MG capsule, Take 1 capsule (60 mg total) by mouth daily., Disp: 90 capsule, Rfl: 1   estrogen, conjugated,-medroxyprogesterone (PREMPRO) 0.625-5 MG tablet, PREMPRO 0.625-5 MG TABS, Disp: , Rfl:    febuxostat  (ULORIC ) 40 MG tablet, Take 1 tablet (40 mg total) by mouth daily., Disp: 90 tablet, Rfl: 1    glipiZIDE  (GLUCOTROL  XL) 2.5 MG 24 hr tablet, Take 1 tablet (2.5 mg total) by mouth daily with breakfast., Disp: 90 tablet, Rfl: 1   pioglitazone  (ACTOS ) 15 MG tablet, Take 1 tablet (15 mg total) by mouth daily., Disp: 90 tablet, Rfl: 1   rosuvastatin  (CRESTOR ) 40 MG tablet, Take 1 tablet by mouth once daily, Disp: 30 tablet, Rfl: 0   Albuterol -Budesonide  (AIRSUPRA ) 90-80 MCG/ACT AERO, Inhale 2 puffs into the lungs 4 (four) times daily as needed. (Patient not taking: Reported on 01/03/2024), Disp: 10.7 g, Rfl: 0   primidone (MYSOLINE) 50 MG tablet, Take 50 mg nightly for 2 weeks. If ongoing symptoms, can increase to 75 mg nightly. Refill. (Patient not taking: Reported on 01/03/2024), Disp: , Rfl:   No Known Allergies  I personally reviewed active problem list, medication list, allergies with the patient/caregiver today.   ROS  Ten systems reviewed and is negative except as mentioned in HPI    Objective Physical Exam CONSTITUTIONAL: Patient appears well-developed and well-nourished. No distress. HEENT: Head atraumatic, normocephalic, neck supple. CARDIOVASCULAR: Normal rate, regular rhythm and normal heart sounds. No murmur heard. No BLE edema. PULMONARY: Effort normal. Breath sounds normal with bronchi present in the posterior lung fields. No respiratory distress. ABDOMINAL: There is no tenderness or distention. MUSCULOSKELETAL: Normal gait. Without gross motor or sensory deficit. PSYCHIATRIC: Patient has a normal mood and affect. Behavior is normal. Judgment and thought content normal.  Vitals:   01/03/24 1500  BP: 122/76  Pulse: 79  Resp: 16  Temp: 98 F (36.7 C)  SpO2: 98%  Weight: 197 lb 4.8 oz (89.5 kg)  Height: 5' 7 (1.702 m)    Body mass index is 30.9 kg/m.  Recent Results (from the past 2160 hours)  POCT glycosylated hemoglobin (Hb A1C)     Status: Abnormal   Collection Time: 01/03/24  3:08 PM  Result Value Ref Range   Hemoglobin A1C 5.7 (A) 4.0 - 5.6 %   HbA1c  POC (<> result, manual entry)     HbA1c, POC (prediabetic range)     HbA1c, POC (controlled diabetic range)       PHQ2/9:    01/03/2024    3:01 PM 01/03/2024    2:55 PM 10/03/2023    9:29 AM 02/09/2023    8:58 AM 12/01/2022    8:14 AM  Depression screen PHQ 2/9  Decreased Interest 0 0 0 0 0  Down, Depressed, Hopeless 0 0 0 0 0  PHQ - 2 Score 0 0 0 0 0  Altered sleeping 0 0 0 0 0  Tired, decreased energy 0 0 0 0 0  Change in appetite 0 0 0 0 0  Feeling bad or failure about yourself  0 0 0 0 0  Trouble concentrating 0 0 0 0 0  Moving slowly or fidgety/restless 0 0 0 0 0  Suicidal thoughts 0 0 0 0 0  PHQ-9 Score 0 0 0  0  0   Difficult doing work/chores Not difficult at all Not difficult at all Not difficult at all Not difficult at all      Data saved with a previous flowsheet row definition    phq 9 is negative  Fall Risk:    01/03/2024    3:01 PM 01/03/2024    2:55 PM 10/03/2023    9:19 AM 02/09/2023    8:51 AM 12/01/2022    8:13 AM  Fall Risk   Falls in the past year? 1 0 1 1 1   Number falls in past yr: 0 0 0 0 1  Injury with Fall? 1 0 0 0 1  Risk for fall due to :  No Fall Risks Impaired balance/gait Impaired balance/gait History of fall(s)  Follow up Falls evaluation completed Falls evaluation completed Falls evaluation completed Falls prevention discussed;Education provided;Falls evaluation completed Falls evaluation completed;Education provided;Falls prevention discussed     Assessment & Plan Adult Wellness Visit Due for a physical exam by October 30th, 2025, to maintain eligibility for work discounts. - Scheduled physical exam for October 30th, 2025.  Type 2 diabetes mellitus, well controlled Diabetes is well controlled with an A1c of 5.7. Discussed potential hypoglycemia with glipizide  and the importance of maintaining A1c between 6 and 6.5. - Take glipizide  as needed, especially during events with increased sugar intake. - Continue pioglitazone   daily.  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 Not on dialysis but on the transplant list. Hemoglobin is low due to anemia of chronic disease. Parathyroid  hormone levels are elevated, indicating secondary hyperparathyroidism. - Continue monitoring kidney function and remain on transplant list.  Hypertension Managed with carvedilol , amlodipine , and olmesartan . - Refilled amlodipine  and olmesartan  prescriptions.  Dyslipidemia Managed with rosuvastatin . Recent lipid panel shows LDL at 74, which is satisfactory. - Continue rosuvastatin  40 mg daily.  Secondary hyperparathyroidism of renal origin Secondary hyperparathyroidism due to chronic kidney disease. Elevated parathyroid  hormone levels noted. - Continue monitoring parathyroid  hormone levels.  Atherosclerosis of aorta Thoracic aortic atherosclerosis managed with cholesterol medication. - Continue current management with rosuvastatin .  Gout, controlled Gout is currently controlled with no recent flare-ups reported. - Continue current management.  Mild persistent asthma and simple chronic bronchitis Mild persistent asthma and chronic bronchitis with cough and phlegm production, especially during colds. Previously prescribed Airsupra , but cost is prohibitive. Discussed alternative inhalers and the importance of managing inflammation. - Prescribed Symbicort  inhaler, 2 puffs twice daily, increase to 4 times daily if sick. - Instructed to rinse mouth after using Symbicort .  Tremor Ongoing tremors of both hands, possibly stress-related. Previously prescribed primidone  for nerve-related tremors. - Prescribed primidone  75 mg, 1.5 tablets at night.  Depression Managed with duloxetine  due to emotional impact of multiple medical issues. - Continue duloxetine  as prescribed.

## 2024-01-15 ENCOUNTER — Encounter: Payer: Self-pay | Admitting: Internal Medicine

## 2024-01-15 ENCOUNTER — Inpatient Hospital Stay: Attending: Internal Medicine | Admitting: Internal Medicine

## 2024-01-15 ENCOUNTER — Inpatient Hospital Stay

## 2024-01-15 VITALS — BP 142/70 | HR 64 | Temp 98.5°F | Resp 16 | Ht 67.0 in | Wt 199.5 lb

## 2024-01-15 DIAGNOSIS — E119 Type 2 diabetes mellitus without complications: Secondary | ICD-10-CM | POA: Diagnosis not present

## 2024-01-15 DIAGNOSIS — D631 Anemia in chronic kidney disease: Secondary | ICD-10-CM | POA: Diagnosis not present

## 2024-01-15 DIAGNOSIS — N184 Chronic kidney disease, stage 4 (severe): Secondary | ICD-10-CM | POA: Insufficient documentation

## 2024-01-15 DIAGNOSIS — Z803 Family history of malignant neoplasm of breast: Secondary | ICD-10-CM | POA: Diagnosis not present

## 2024-01-15 DIAGNOSIS — Z7982 Long term (current) use of aspirin: Secondary | ICD-10-CM | POA: Insufficient documentation

## 2024-01-15 DIAGNOSIS — M129 Arthropathy, unspecified: Secondary | ICD-10-CM | POA: Insufficient documentation

## 2024-01-15 DIAGNOSIS — I129 Hypertensive chronic kidney disease with stage 1 through stage 4 chronic kidney disease, or unspecified chronic kidney disease: Secondary | ICD-10-CM | POA: Diagnosis present

## 2024-01-15 DIAGNOSIS — Z87891 Personal history of nicotine dependence: Secondary | ICD-10-CM | POA: Insufficient documentation

## 2024-01-15 DIAGNOSIS — Z79899 Other long term (current) drug therapy: Secondary | ICD-10-CM | POA: Insufficient documentation

## 2024-01-15 DIAGNOSIS — Z7984 Long term (current) use of oral hypoglycemic drugs: Secondary | ICD-10-CM | POA: Insufficient documentation

## 2024-01-15 DIAGNOSIS — E785 Hyperlipidemia, unspecified: Secondary | ICD-10-CM | POA: Diagnosis not present

## 2024-01-15 DIAGNOSIS — Z860101 Personal history of adenomatous and serrated colon polyps: Secondary | ICD-10-CM | POA: Insufficient documentation

## 2024-01-15 DIAGNOSIS — D649 Anemia, unspecified: Secondary | ICD-10-CM | POA: Diagnosis not present

## 2024-01-15 DIAGNOSIS — E559 Vitamin D deficiency, unspecified: Secondary | ICD-10-CM | POA: Diagnosis not present

## 2024-01-15 DIAGNOSIS — G4700 Insomnia, unspecified: Secondary | ICD-10-CM | POA: Diagnosis not present

## 2024-01-15 DIAGNOSIS — M797 Fibromyalgia: Secondary | ICD-10-CM | POA: Insufficient documentation

## 2024-01-15 NOTE — Patient Instructions (Signed)
#  Recommend gentle iron  [iron  biglycinate; 28 mg ] 1 pill a day.  This pill is unlikely to cause stomach upset or cause constipation.  Available Over the counter or talk to pharmacist.

## 2024-01-15 NOTE — Progress Notes (Signed)
 [facility] CONSULT NOTE  Patient Care Team: Sowles, Krichna, MD as PCP - General (Family Medicine) Sowles, Krichna, MD as Attending Physician (Family Medicine) Dessa, Reyes ORN, MD (General Surgery) Rennie Cindy SAUNDERS, MD as Consulting Physician (Oncology)  CHIEF COMPLAINTS/PURPOSE OF CONSULTATION: ANEMIA   HEMATOLOGY HISTORY  # ANEMIA[Hb; MCV-platelets- WBC; Iron  sat; ferritin;  GFR- CT/US ; EGD/colonoscopy->> not due  # CKD- [Dr.]  HISTORY OF PRESENTING ILLNESS:  Cheryl Barr 68 y.o.  female pleasant patient with a history of CKD and anemia has been referred to us  for further evaluation of anemia.  Discussed the use of AI scribe software for clinical note transcription with the patient, who gave verbal consent to proceed.  History of Present Illness   Cheryl Barr is a 68 year old female with anemia and chronic kidney disease who presents for further evaluation and recommendations. She was referred by Dr. Dennise, her nephrologist, for further evaluation of her anemia.  She has anemia with a hemoglobin level of 8. Despite this, she does not experience fatigue, dyspnea, or other typical symptoms associated with anemia. She denies shortness of breath, chest pain, unusual symptoms, blood in stools, and vaginal bleeding. She has not experienced any liver problems.  She has chronic kidney disease and is on a transplant list. A recent potential kidney match did not proceed due to issues with the donor kidney. She had blood drawn last Wednesday as part of this process. She follows up with her nephrologist every two to three months, with her last visit in November and the next scheduled for January.  She has not been on dialysis and has never received a blood transfusion. She has no history of gastrointestinal bleeding and has had a colonoscopy in the past, though she does not recall the exact timing. She has not had any upper gastrointestinal scopes or a bone marrow  biopsy and does not take iron  supplements.  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 She lives with a friend and does not smoke or drink alcohol.       Chronic kidney disease: stage IV [Dr.Singh] Blood in stools: none Blood in urine: none Difficulty swallowing: Change of bowel movement/constipation:none Prior blood transfusion: none Prior history of blood loss: none Liver disease:none Alcohol: none Bariatric surgery: none   Vaginal bleeding: none Prior evaluation with hematology:none Prior bone marrow biopsy:  none Oral iron :none Prior IV iron  infusions:none    Review of Systems  Constitutional:  Negative for chills, diaphoresis, fever, malaise/fatigue and weight loss.  HENT:  Negative for nosebleeds and sore throat.   Eyes:  Negative for double vision.  Respiratory:  Negative for cough, hemoptysis, sputum production, shortness of breath and wheezing.   Cardiovascular:  Negative for chest pain, palpitations, orthopnea and leg swelling.  Gastrointestinal:  Negative for abdominal pain, blood in stool, constipation, diarrhea, heartburn, melena, nausea and vomiting.  Genitourinary:  Negative for dysuria, frequency and urgency.  Musculoskeletal:  Negative for back pain and joint pain.  Skin: Negative.  Negative for itching and rash.  Neurological:  Negative for dizziness, tingling, focal weakness, weakness and headaches.  Endo/Heme/Allergies:  Does not bruise/bleed easily.  Psychiatric/Behavioral:  Negative for depression. The patient is not nervous/anxious and does not have insomnia.      MEDICAL HISTORY:  Past Medical History:  Diagnosis Date   Arthritis    Colon polyp 2009   Depression    Diabetes mellitus  Type 2   Fibromyalgia    Gout    Hyperlipidemia    Hypertension    Insomnia    Menopause    Vitamin D  deficiency     SURGICAL HISTORY: Past Surgical History:  Procedure Laterality Date    AV FISTULA PLACEMENT Left 02/22/2023   Procedure: ARTERIOVENOUS (AV) FISTULA CREATION;  Surgeon: Marea Selinda RAMAN, MD;  Location: [facility] ORS;  Service: Vascular;  Laterality: Left;   BREAST BIOPSY Left 05/24/2013   neg   BREAST EXCISIONAL BIOPSY Left 06/21/2013   neg/ dr. Dessa   BREAST SURGERY Left 2015   COLONOSCOPY  02-01-08   1 polyp found. Dr. Chapman   COLONOSCOPY WITH PROPOFOL  N/A 05/04/2021   Procedure: COLONOSCOPY WITH PROPOFOL ;  Surgeon: Jinny Carmine, MD;  Location: [facility]COPY;  Service: Endoscopy;  Laterality: N/A;   HERNIA REPAIR  10/09/2014   Ventral/umbilical hernia repair with 10 x 10 cm Atrium mesh in preperitoneal position.   INSERTION OF MESH N/A 10/09/2014   Procedure: INSERTION OF MESH;  Surgeon: Reyes LELON Dessa, MD;  Location: [facility] ORS;  Service: General;  Laterality: N/A;   KNEE SURGERY Left 01/14/2009   STRABISMUS SURGERY     Repair as a child   TUBAL LIGATION  1990   VENTRAL HERNIA REPAIR N/A 10/09/2014   Procedure: HERNIA REPAIR VENTRAL ADULT;  Surgeon: Reyes LELON Dessa, MD;  Location: [facility] ORS;  Service: General;  Laterality: N/A;    SOCIAL HISTORY: Social History   Socioeconomic History   Marital status: Widowed    Spouse name: Not on file   Number of children: 3   Years of education: 12th Grade   Highest education level: Not on file  Occupational History   Occupation: kitchen prep     Employer: [employer]  Tobacco Use   Smoking status: Former    Current packs/day: 0.00    Types: Cigarettes    Quit date: 10/02/1983    Years since quitting: 40.3   Smokeless tobacco: Never  Vaping Use   Vaping status: Never Used  Substance and Sexual Activity   Alcohol use: No    Alcohol/week: 0.0 standard drinks of alcohol   Drug use: No   Sexual activity: Yes    Partners: Male    Birth control/protection: None    Comment: declined condoms   Other Topics Concern   Not on file  Social History Narrative   Widowed. Living with boyfriend    No regular  exercise.   Two other brothers one adopted since 49 days old, died in 2021/01/31 with liver cancer, also has a foster brother    Social Drivers of Corporate Investment Banker Strain: Low Risk  (05/02/2023)   Received from [facility]   Overall Financial Resource Strain (CARDIA)    Difficulty of Paying Living Expenses: Not hard at all  Food Insecurity: No Food Insecurity (01/15/2024)   Hunger Vital Sign    Worried About Running Out of Food in the Last Year: Never true    Ran Out of Food in the Last Year: Never true  Transportation Needs: No Transportation Needs (01/15/2024)   PRAPARE - Administrator, Civil Service (Medical): No    Lack of Transportation (Non-Medical): No  Physical Activity: Inactive (12/01/2022)   Exercise Vital Sign    Days of Exercise per Week: 0 days    Minutes of Exercise per Session: 0 min  Stress: No Stress Concern Present (12/01/2022)   [employer]  of Occupational Health - Occupational Stress Questionnaire    Feeling of Stress : Only a little  Social Connections: Moderately Isolated (12/01/2022)   Social Connection and Isolation Panel    Frequency of Communication with Friends and Family: More than three times a week    Frequency of Social Gatherings with Friends and Family: Once a week    Attends Religious Services: More than 4 times per year    Active Member of [employer] or Organizations: No    Attends Banker Meetings: Never    Marital Status: Widowed  Intimate Partner Violence: Not At Risk (01/15/2024)   Humiliation, Afraid, Rape, and Kick questionnaire    Fear of Current or Ex-Partner: No    Emotionally Abused: No    Physically Abused: No    Sexually Abused: No    FAMILY HISTORY: Family History  Problem Relation Age of Onset   Hypertension Mother    Diabetes Mother    Heart attack Father    Coronary artery disease Father    Breast cancer Father 51   Diabetes Sister    Gout Brother    Heart disease Brother     Allergies Son        Arts Administrator and Bee Stings    ALLERGIES:  has no known allergies.  MEDICATIONS:  Current Outpatient Medications  Medication Sig Dispense Refill   amLODipine -olmesartan  (AZOR ) 10-40 MG tablet Take 1 tablet by mouth daily. 90 tablet 1   aspirin 81 MG EC tablet Take 81 mg by mouth daily.     budesonide -formoterol  (SYMBICORT ) 160-4.5 MCG/ACT inhaler Inhale 2 puffs into the lungs 2 (two) times daily. 3 each 1   calcitRIOL (ROCALTROL) 0.25 MCG capsule Take 0.25 mcg by mouth daily.     carvedilol  (COREG ) 12.5 MG tablet Take 1 tablet (12.5 mg total) by mouth 2 (two) times daily with a meal. 180 tablet 1   DULoxetine  (CYMBALTA ) 60 MG capsule Take 1 capsule (60 mg total) by mouth daily. 90 capsule 1   estrogen, conjugated,-medroxyprogesterone (PREMPRO) 0.625-5 MG tablet Take 1 tablet by mouth daily.     febuxostat  (ULORIC ) 40 MG tablet Take 1 tablet (40 mg total) by mouth daily. 90 tablet 1   glipiZIDE  (GLUCOTROL  XL) 2.5 MG 24 hr tablet Take 1 tablet (2.5 mg total) by mouth daily with breakfast. 90 tablet 1   pioglitazone  (ACTOS ) 15 MG tablet Take 1 tablet (15 mg total) by mouth daily. 90 tablet 1   primidone  (MYSOLINE ) 50 MG tablet Take 1.5 tablets (75 mg total) by mouth at bedtime. 45 tablet 0   rosuvastatin  (CRESTOR ) 40 MG tablet Take 1 tablet (40 mg total) by mouth daily. 90 tablet 1   No current facility-administered medications for this visit.     SABRA  PHYSICAL EXAMINATION:   Vitals:   01/15/24 1354  BP: (!) 142/70  Pulse: 64  Resp: 16  Temp: 98.5 F (36.9 C)  SpO2: 100%   Filed Weights   01/15/24 1354  Weight: 199 lb 8 oz (90.5 kg)    Physical Exam Vitals and nursing note reviewed.  HENT:     Head: Normocephalic and atraumatic.     Mouth/Throat:     Pharynx: Oropharynx is clear.  Eyes:     Extraocular Movements: Extraocular movements intact.     Pupils: Pupils are equal, round, and reactive to light.  Cardiovascular:     Rate and Rhythm: Normal rate  and regular rhythm.  Pulmonary:     Comments: Decreased  breath sounds bilaterally.  Abdominal:     Palpations: Abdomen is soft.  Musculoskeletal:        General: Normal range of motion.     Cervical back: Normal range of motion.  Skin:    General: Skin is warm.  Neurological:     General: No focal deficit present.     Mental Status: She is alert and oriented to person, place, and time.  Psychiatric:        Behavior: Behavior normal.        Judgment: Judgment normal.      LABORATORY DATA:  I have reviewed the data as listed Lab Results  Component Value Date   WBC 5.0 02/13/2023   HGB 8.8 (L) 02/13/2023   HCT 28.3 (L) 02/13/2023   MCV 84.5 02/13/2023   PLT 310 02/13/2023   Recent Labs    02/13/23 0953  NA 142  K 3.4*  CL 105  CO2 26  GLUCOSE 94  BUN 52*  CREATININE 3.17*  CALCIUM  9.2  GFRNONAA 15*     No results found.  ASSESSMENT & PLAN:   Symptomatic anemia # Chronic anemia-normocytic recently getting worse, however patient is quite Asymptomatic from anemia . The etiology is likely chronic renal disease. Recommend gentle iron  [iron  biglycinate; 28 mg ] 1 pill a day.  This pill is unlikely to cause stomach upset or cause constipation.  I had a long discussion with patient regarding multiple other etiologies of anemia-including nutritional; malabsorption; primary bone marrow disorders etc. however hold off bone marrow biopsy at this time.  # #Discussed at length the pathophysiology of anemia from chronic kidney disease which includes decreased erythropoietin production; and decreased iron  stores in the body.  I discussed that I would recommend using iron  infusion/Venofer ; along with bone marrow stimulating agents /like erythropoietin to maintain hemoglobin around 10-11.   I would recommend the goal hemoglobin between 10-11 as there is a increased risk of thromboembolic events in the target hemoglobin is around 12 to 13.   I discussed the potential acute infusion  reactions with IV iron ; which are quite rare.  However as patient is currently asymptomatic-hold off on iron  infusions or Procrit.  Proceed with oral iron  at this time.  # At next office follow-up -I recommend CBC CMP LDH peripheral smear; haptoglobin; erythropoietin; iron  studies ferritin B12 folic acid; reticulocyte count; multiple myeloma panel.  Kappa lambda light chain ratio. HOLD of bone marrow Biopsy at this time; pending above work up.   # CKD- stage IV [DrSingh.]-Has a fistula in place no dialysis.  Thank you Dr. Dennise for allowing me to participate in the care of your pleasant patient. Please do not hesitate to contact me with questions or concerns in the interim.  # DISPOSITION: # no labs today- # follow up 3 months-MD-possible venofer  or retacrit- 2 weeks prior- please order labs- CBC CMP LDH  haptoglobin; erythropoietin; iron  studies ferritin B12 folic acid; reticulocyte count multiple myeloma panel.  Kappa lambda light chain ratio. -- Dr.B    All questions were answered. The patient knows to call the clinic with any problems, questions or concerns.    Cindy JONELLE Joe, MD 01/15/2024 2:51 PM

## 2024-01-15 NOTE — Assessment & Plan Note (Addendum)
#   Chronic anemia-normocytic recently getting worse, however patient is quite Asymptomatic from anemia . The etiology is likely chronic renal disease. Recommend gentle iron  [iron  biglycinate; 28 mg ] 1 pill a day.  This pill is unlikely to cause stomach upset or cause constipation.  I had a long discussion with patient regarding multiple other etiologies of anemia-including nutritional; malabsorption; primary bone marrow disorders etc. however hold off bone marrow biopsy at this time.  # #Discussed at length the pathophysiology of anemia from chronic kidney disease which includes decreased erythropoietin production; and decreased iron  stores in the body.  I discussed that I would recommend using iron  infusion/Venofer ; along with bone marrow stimulating agents /like erythropoietin to maintain hemoglobin around 10-11.   I would recommend the goal hemoglobin between 10-11 as there is a increased risk of thromboembolic events in the target hemoglobin is around 12 to 13.   I discussed the potential acute infusion reactions with IV iron ; which are quite rare.  However as patient is currently asymptomatic-hold off on iron  infusions or Procrit.  Proceed with oral iron  at this time.  # At next office follow-up -I recommend CBC CMP LDH peripheral smear; haptoglobin; erythropoietin; iron  studies ferritin B12 folic acid; reticulocyte count; multiple myeloma panel.  Kappa lambda light chain ratio. HOLD of bone marrow Biopsy at this time; pending above work up.   # CKD- stage IV [DrSingh.]-Has a fistula in place no dialysis.  Thank you Dr. Dennise for allowing me to participate in the care of your pleasant patient. Please do not hesitate to contact me with questions or concerns in the interim.  # DISPOSITION: # no labs today- # follow up 3 months-MD-possible venofer  or retacrit- 2 weeks prior- please order labs- CBC CMP LDH  haptoglobin; erythropoietin; iron  studies ferritin B12 folic acid; reticulocyte count multiple  myeloma panel.  Kappa lambda light chain ratio. -- Dr.B

## 2024-01-15 NOTE — Progress Notes (Signed)
 Fatigue/weakness: NO Dyspena: NO  Light headedness: NO  Blood in stool: NO

## 2024-02-01 ENCOUNTER — Ambulatory Visit (INDEPENDENT_AMBULATORY_CARE_PROVIDER_SITE_OTHER): Admitting: Nurse Practitioner

## 2024-02-01 ENCOUNTER — Other Ambulatory Visit (INDEPENDENT_AMBULATORY_CARE_PROVIDER_SITE_OTHER)

## 2024-02-01 ENCOUNTER — Encounter (INDEPENDENT_AMBULATORY_CARE_PROVIDER_SITE_OTHER): Payer: Self-pay | Admitting: Nurse Practitioner

## 2024-02-01 VITALS — BP 123/87 | HR 74 | Resp 17 | Ht 67.0 in | Wt 200.2 lb

## 2024-02-01 DIAGNOSIS — N186 End stage renal disease: Secondary | ICD-10-CM | POA: Diagnosis not present

## 2024-02-01 DIAGNOSIS — I10 Essential (primary) hypertension: Secondary | ICD-10-CM

## 2024-02-01 DIAGNOSIS — N185 Chronic kidney disease, stage 5: Secondary | ICD-10-CM

## 2024-02-01 DIAGNOSIS — R809 Proteinuria, unspecified: Secondary | ICD-10-CM | POA: Diagnosis not present

## 2024-02-01 DIAGNOSIS — E1129 Type 2 diabetes mellitus with other diabetic kidney complication: Secondary | ICD-10-CM

## 2024-02-01 NOTE — Progress Notes (Signed)
 Subjective:    Patient ID: Cheryl Barr, female    DOB: 13-Dec-1955, 68 y.o.   MRN: [id number] Chief Complaint  Patient presents with   Follow-up    6 months + HDA    HPI  Discussed the use of AI scribe software for clinical note transcription with the patient, who gave verbal consent to proceed.  History of Present Illness Cheryl Barr is a 68 year old female who presents for vascular surgery consultation regarding the status of her fistula and potential use for dialysis.  She has a fistula that is not currently in use. There are no bleeding or problems with the fistula, and the flow volume is good, around 2324. However, there are areas described as tight or stenotic.  She is not yet on dialysis and is concerned about the potential need for a fistulogram due to the use of dye, which could affect her kidneys. Her blood pressure is stable, and she feels the buzzing of the fistula daily, checking it two to three times a day. She is active, working every day, and reports no pain, which has delayed the need for dialysis.  She mentions a recent experience where she was called about a potential kidney transplant, but it was not viable. She remains on the transplant list and is hopeful for a future kidney transplant.  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    Results Flow volume of 2324, noted areas of stenosis near the clavicle and confluence area.  Decreased flow from previous 2824 in February.   Review of Systems  Respiratory:  Negative for chest tightness and shortness of breath.   All other systems reviewed and are negative.      Objective:   Physical Exam Vitals reviewed.  HENT:     Head: Normocephalic.  Cardiovascular:     Rate and Rhythm: Normal rate.     Pulses:          Radial pulses are 2+ on the left side.     Arteriovenous access: Left arteriovenous  access is present.     Comments: Good thrill and bruit present Pulmonary:     Effort: Pulmonary effort is normal.  Skin:    General: Skin is warm and dry.  Neurological:     Mental Status: She is alert and oriented to person, place, and time.  Psychiatric:        Mood and Affect: Mood normal.        Behavior: Behavior normal.        Thought Content: Thought content normal.        Judgment: Judgment normal.     Physical Exam CARDIOVASCULAR: Good pulse present.  BP 123/87   Pulse 74   Resp 17   Ht 5' 7 (1.702 m)   Wt 200 lb 3.2 oz (90.8 kg)   BMI 31.36 kg/m   Past Medical History:  Diagnosis Date   Arthritis    Colon polyp 2009   Depression    Diabetes mellitus    Type 2   Fibromyalgia    Gout    Hyperlipidemia    Hypertension    Insomnia    Menopause    Vitamin D  deficiency     Social History   Socioeconomic History   Marital status: Widowed    Spouse name: Not on file   Number of children: 3   Years  of education: 12th Grade   Highest education level: Not on file  Occupational History   Occupation: kitchen prep     Employer: [employer]  Tobacco Use   Smoking status: Former    Current packs/day: 0.00    Types: Cigarettes    Quit date: 10/02/1983    Years since quitting: 40.3   Smokeless tobacco: Never  Vaping Use   Vaping status: Never Used  Substance and Sexual Activity   Alcohol use: No    Alcohol/week: 0.0 standard drinks of alcohol   Drug use: No   Sexual activity: Yes    Partners: Male    Birth control/protection: None    Comment: declined condoms   Other Topics Concern   Not on file  Social History Narrative   Widowed. Living with boyfriend    No regular exercise.   Two other brothers one adopted since 102 days old, died in February 23, 2021 with liver cancer, also has a foster brother    Social Drivers of Health   Tobacco Use: Medium Risk (02/01/2024)   Patient History    Smoking Tobacco Use: Former    Smokeless Tobacco Use: Never     Passive Exposure: Not on file  Financial Resource Strain: Low Risk  (05/02/2023)   Received from [facility]   Overall Financial Resource Strain (CARDIA)    Difficulty of Paying Living Expenses: Not hard at all  Food Insecurity: No Food Insecurity (01/15/2024)   Epic    Worried About Radiation Protection Practitioner of Food in the Last Year: Never true    Ran Out of Food in the Last Year: Never true  Transportation Needs: No Transportation Needs (01/15/2024)   Epic    Lack of Transportation (Medical): No    Lack of Transportation (Non-Medical): No  Physical Activity: Inactive (12/01/2022)   Exercise Vital Sign    Days of Exercise per Week: 0 days    Minutes of Exercise per Session: 0 min  Stress: No Stress Concern Present (12/01/2022)   [employer] of Occupational Health - Occupational Stress Questionnaire    Feeling of Stress : Only a little  Social Connections: Moderately Isolated (12/01/2022)   Social Connection and Isolation Panel    Frequency of Communication with Friends and Family: More than three times a week    Frequency of Social Gatherings with Friends and Family: Once a week    Attends Religious Services: More than 4 times per year    Active Member of [employer] or Organizations: No    Attends Banker Meetings: Never    Marital Status: Widowed  Intimate Partner Violence: Not At Risk (01/15/2024)   Epic    Fear of Current or Ex-Partner: No    Emotionally Abused: No    Physically Abused: No    Sexually Abused: No  Depression (PHQ2-9): Low Risk (01/15/2024)   Depression (PHQ2-9)    PHQ-2 Score: 0  Alcohol Screen: Not on file  Housing: Low Risk (01/15/2024)   Epic    Unable to Pay for Housing in the Last Year: No    Number of Times Moved in the Last Year: 0    Homeless in the Last Year: No  Utilities: Not At Risk (01/15/2024)   Epic    Threatened with loss of utilities: No  Health Literacy: Adequate Health Literacy (12/01/2022)   B1300 Health Literacy     Frequency of need for help with medical instructions: Never    Past Surgical History:  Procedure Laterality  Date   AV FISTULA PLACEMENT Left 02/22/2023   Procedure: ARTERIOVENOUS (AV) FISTULA CREATION;  Surgeon: Marea Selinda RAMAN, MD;  Location: [facility] ORS;  Service: Vascular;  Laterality: Left;   BREAST BIOPSY Left 05/24/2013   neg   BREAST EXCISIONAL BIOPSY Left 06/21/2013   neg/ dr. Dessa   BREAST SURGERY Left 2015   COLONOSCOPY  2009   1 polyp found. Dr. Chapman   COLONOSCOPY WITH PROPOFOL  N/A 05/04/2021   Procedure: COLONOSCOPY WITH PROPOFOL ;  Surgeon: Jinny Carmine, MD;  Location[facility] ENDOSCOPY;  Service: Endoscopy;  Laterality: N/A;   HERNIA REPAIR  10/09/2014   Ventral/umbilical hernia repair with 10 x 10 cm Atrium mesh in preperitoneal position.   INSERTION OF MESH N/A 10/09/2014   Procedure: INSERTION OF MESH;  Surgeon: Reyes LELON Dessa, MD;  Location: [facility] ORS;  Service: General;  Laterality: N/A;   KNEE SURGERY Left 01/14/2009   STRABISMUS SURGERY     Repair as a child   TUBAL LIGATION  1990   VENTRAL HERNIA REPAIR N/A 10/09/2014   Procedure: HERNIA REPAIR VENTRAL ADULT;  Surgeon: Reyes LELON Dessa, MD;  Location: [facility] ORS;  Service: General;  Laterality: N/A;    Family History  Problem Relation Age of Onset   Hypertension Mother    Diabetes Mother    Heart attack Father    Coronary artery disease Father    Breast cancer Father 63   Diabetes Sister    Gout Brother    Heart disease Brother    Allergies Son        Food and Bee Stings    Allergies[1]     Latest Ref Rng & Units 02/13/2023    9:53 AM 11/21/2019   12:07 PM 05/24/2019    7:54 AM  CBC  WBC 4.0 - 10.5 K/uL 5.0  6.3  6.3   Hemoglobin 12.0 - 15.0 g/dL 8.8  89.5  89.8   Hematocrit 36.0 - 46.0 % 28.3  32.0  31.6   Platelets 150 - 400 K/uL 310  320  325       CMP     Component Value Date/Time   NA 142 02/13/2023 0953   NA 141 06/17/2013 0941   K 3.4 (L) 02/13/2023 0953   K 3.1 (L) 06/17/2013 0941   CL 105  02/13/2023 0953   CL 104 06/17/2013 0941   CO2 26 02/13/2023 0953   CO2 30 06/17/2013 0941   GLUCOSE 94 02/13/2023 0953   GLUCOSE 137 (H) 06/17/2013 0941   BUN 52 (H) 02/13/2023 0953   BUN 18 06/17/2013 0941   CREATININE 3.17 (H) 02/13/2023 0953   CREATININE 2.11 (H) 11/21/2019 1207   CALCIUM  9.2 02/13/2023 0953   CALCIUM  9.6 06/17/2013 0941   PROT 7.6 03/07/2022 1418   ALBUMIN 3.8 03/27/2019 0051   AST 10 03/07/2022 1418   ALT 10 03/07/2022 1418   ALKPHOS 79 03/27/2019 0051   BILITOT 0.3 03/07/2022 1418   GFRNONAA 15 (L) 02/13/2023 0953   GFRNONAA 24 (L) 11/21/2019 1207     No results found.     Assessment & Plan:   1. Chronic kidney disease, stage 5 (HCC) (Primary) CKD managed with creatinine at 10. Active and asymptomatic, delaying dialysis. On transplant list with recent unsuccessful attempt.Stenosis present but flow volume adequate at 2324 mL/min. Intervention deferred to avoid nephrotoxicity. Fistulogram considered if dialysis is imminent.  - Monitor fistula for blockage signs. - Schedule fistulogram if dialysis is imminent. - Educated on reporting changes in  fistula function. - VAS US  DUPLEX DIALYSIS ACCESS (AVF, AVG); Future  2. Hypertension, essential, benign Continue antihypertensive medications as already ordered, these medications have been reviewed and there are no changes at this time.  3. Controlled type 2 diabetes mellitus with microalbuminuria, without long-term current use of insulin (HCC) Continue hypoglycemic medications as already ordered, these medications have been reviewed and there are no changes at this time.  Hgb A1C to be monitored as already arranged by primary service     Medications Ordered Prior to Encounter[2]  There are no Patient Instructions on file for this visit. Return in about 3 months (around 05/01/2024) for with HDA JD/FB.   Donley Harland E Tavio Biegel, NP      [1] No Known Allergies [2]  Current Outpatient Medications on File  Prior to Visit  Medication Sig Dispense Refill   amLODipine -olmesartan  (AZOR ) 10-40 MG tablet Take 1 tablet by mouth daily. 90 tablet 1   aspirin 81 MG EC tablet Take 81 mg by mouth daily.     budesonide -formoterol  (SYMBICORT ) 160-4.5 MCG/ACT inhaler Inhale 2 puffs into the lungs 2 (two) times daily. 3 each 1   calcitRIOL (ROCALTROL) 0.25 MCG capsule Take 0.25 mcg by mouth daily.     carvedilol  (COREG ) 12.5 MG tablet Take 1 tablet (12.5 mg total) by mouth 2 (two) times daily with a meal. 180 tablet 1   DULoxetine  (CYMBALTA ) 60 MG capsule Take 1 capsule (60 mg total) by mouth daily. 90 capsule 1   estrogen, conjugated,-medroxyprogesterone (PREMPRO) 0.625-5 MG tablet Take 1 tablet by mouth daily.     febuxostat  (ULORIC ) 40 MG tablet Take 1 tablet (40 mg total) by mouth daily. 90 tablet 1   glipiZIDE  (GLUCOTROL  XL) 2.5 MG 24 hr tablet Take 1 tablet (2.5 mg total) by mouth daily with breakfast. 90 tablet 1   pioglitazone  (ACTOS ) 15 MG tablet Take 1 tablet (15 mg total) by mouth daily. 90 tablet 1   primidone  (MYSOLINE ) 50 MG tablet Take 1.5 tablets (75 mg total) by mouth at bedtime. 45 tablet 0   rosuvastatin  (CRESTOR ) 40 MG tablet Take 1 tablet (40 mg total) by mouth daily. 90 tablet 1   No current facility-administered medications on file prior to visit.

## 2024-02-23 NOTE — Patient Instructions (Incomplete)
 Preventive Care 44 Years and Older, Female Preventive care refers to lifestyle choices and visits with your health care provider that can promote health and wellness. Preventive care visits are also called wellness exams. What can I expect for my preventive care visit? Counseling Your health care provider may ask you questions about your: Medical history, including: Past medical problems. Family medical history. Pregnancy and menstrual history. History of falls. Current health, including: Memory and ability to understand (cognition). Emotional well-being. Home life and relationship well-being. Sexual activity and sexual health. Lifestyle, including: Alcohol, nicotine or tobacco, and drug use. Access to firearms. Diet, exercise, and sleep habits. Work and work astronomer. Sunscreen use. Safety issues such as seatbelt and bike helmet use. Physical exam Your health care provider will check your: Height and weight. These may be used to calculate your BMI (body mass index). BMI is a measurement that tells if you are at a healthy weight. Waist circumference. This measures the distance around your waistline. This measurement also tells if you are at a healthy weight and may help predict your risk of certain diseases, such as type 2 diabetes and high blood pressure. Heart rate and blood pressure. Body temperature. Skin for abnormal spots. What immunizations do I need?  Vaccines are usually given at various ages, according to a schedule. Your health care provider will recommend vaccines for you based on your age, medical history, and lifestyle or other factors, such as travel or where you work. What tests do I need? Screening Your health care provider may recommend screening tests for certain conditions. This may include: Lipid and cholesterol levels. Hepatitis C test. Hepatitis B test. HIV (human immunodeficiency virus) test. STI (sexually transmitted infection) testing, if you are at  risk. Lung cancer screening. Colorectal cancer screening. Diabetes screening. This is done by checking your blood sugar (glucose) after you have not eaten for a while (fasting). Mammogram. Talk with your health care provider about how often you should have regular mammograms. BRCA-related cancer screening. This may be done if you have a family history of breast, ovarian, tubal, or peritoneal cancers. Bone density scan. This is done to screen for osteoporosis. Talk with your health care provider about your test results, treatment options, and if necessary, the need for more tests. Follow these instructions at home: Eating and drinking  Eat a diet that includes fresh fruits and vegetables, whole grains, lean protein, and low-fat dairy products. Limit your intake of foods with high amounts of sugar, saturated fats, and salt. Take vitamin and mineral supplements as recommended by your health care provider. Do not drink alcohol if your health care provider tells you not to drink. If you drink alcohol: Limit how much you have to 0-1 drink a day. Know how much alcohol is in your drink. In the [place] one drink equals one 12 oz bottle of beer (355 mL), one 5 oz glass of wine (148 mL), or one 1 oz glass of hard liquor (44 mL). Lifestyle Brush your teeth every morning and night with fluoride toothpaste. Floss one time each day. Exercise for at least 30 minutes 5 or more days each week. Do not use any products that contain nicotine or tobacco. These products include cigarettes, chewing tobacco, and vaping devices, such as e-cigarettes. If you need help quitting, ask your health care provider. Do not use drugs. If you are sexually active, practice safe sex. Use a condom or other form of protection in order to prevent STIs. Take aspirin only as told by  your health care provider. Make sure that you understand how much to take and what form to take. Work with your health care provider to find out whether it  is safe and beneficial for you to take aspirin daily. Ask your health care provider if you need to take a cholesterol-lowering medicine (statin). Find healthy ways to manage stress, such as: Meditation, yoga, or listening to music. Journaling. Talking to a trusted person. Spending time with friends and family. Minimize exposure to UV radiation to reduce your risk of skin cancer. Safety Always wear your seat belt while driving or riding in a vehicle. Do not drive: If you have been drinking alcohol. Do not ride with someone who has been drinking. When you are tired or distracted. While texting. If you have been using any mind-altering substances or drugs. Wear a helmet and other protective equipment during sports activities. If you have firearms in your house, make sure you follow all gun safety procedures. What's next? Visit your health care provider once a year for an annual wellness visit. Ask your health care provider how often you should have your eyes and teeth checked. Stay up to date on all vaccines. This information is not intended to replace advice given to you by your health care provider. Make sure you discuss any questions you have with your health care provider. Document Revised: 07/29/2020 Document Reviewed: 07/29/2020 Elsevier Patient Education  2024 [employer].

## 2024-02-26 ENCOUNTER — Ambulatory Visit (INDEPENDENT_AMBULATORY_CARE_PROVIDER_SITE_OTHER): Admitting: Family Medicine

## 2024-02-26 ENCOUNTER — Encounter: Payer: Self-pay | Admitting: Family Medicine

## 2024-02-26 VITALS — BP 130/72 | HR 65 | Temp 97.5°F | Resp 18 | Ht 67.0 in | Wt 194.6 lb

## 2024-02-26 DIAGNOSIS — Z780 Asymptomatic menopausal state: Secondary | ICD-10-CM

## 2024-02-26 DIAGNOSIS — Z13820 Encounter for screening for osteoporosis: Secondary | ICD-10-CM

## 2024-02-26 DIAGNOSIS — Z1231 Encounter for screening mammogram for malignant neoplasm of breast: Secondary | ICD-10-CM

## 2024-02-26 DIAGNOSIS — Z1211 Encounter for screening for malignant neoplasm of colon: Secondary | ICD-10-CM

## 2024-02-26 DIAGNOSIS — Z01419 Encounter for gynecological examination (general) (routine) without abnormal findings: Secondary | ICD-10-CM

## 2024-02-26 NOTE — Progress Notes (Signed)
 Name: Cheryl Barr   MRN: [id number]    DOB: 06/27/1955   Date:02/26/2024       Progress Note  Subjective  Chief Complaint  Chief Complaint  Patient presents with   Annual Exam    HPI  Patient presents for annual CPE.  Diet: following a renal diet but has questions about it, offered dietician but wants to hold off  Exercise: discussed importance of regular activity  Last Eye Exam: encouraged to complete Last Dental Exam: she has dentures   Flowsheet Row Office Visit from 02/26/2024 in [facility]  AUDIT-C Score 0   Depression: Phq 9 is  negative    02/26/2024    2:50 PM 02/26/2024    2:35 PM 01/15/2024    1:59 PM 01/03/2024    3:01 PM 01/03/2024    2:55 PM  Depression screen PHQ 2/9  Decreased Interest 0 0 0 0 0  Down, Depressed, Hopeless 0 0 0 0 0  PHQ - 2 Score 0 0 0 0 0  Altered sleeping 0 0 0 0 0  Tired, decreased energy 0 0 0 0 0  Change in appetite 0 0 0 0 0  Feeling bad or failure about yourself  0 0 0 0 0  Trouble concentrating 0 0 0 0 0  Moving slowly or fidgety/restless 0 0 0 0 0  Suicidal thoughts 0 0 0 0 0  PHQ-9 Score 0 0 0 0 0  Difficult doing work/chores Not difficult at all Not difficult at all  Not difficult at all Not difficult at all   Hypertension: BP Readings from Last 3 Encounters:  02/26/24 130/72  02/01/24 123/87  01/15/24 (!) 142/70   Obesity: Wt Readings from Last 3 Encounters:  02/26/24 194 lb 9.6 oz (88.3 kg)  02/01/24 200 lb 3.2 oz (90.8 kg)  01/15/24 199 lb 8 oz (90.5 kg)   BMI Readings from Last 3 Encounters:  02/26/24 30.48 kg/m  02/01/24 31.36 kg/m  01/15/24 31.25 kg/m     Vaccines: reviewed with the patient.   Hep C Screening: completed STD testing and prevention (HIV/chl/gon/syphilis): up to date  Intimate partner violence: negative screen  Sexual History : occasionally sexually active but uses condoms  Menstrual History/LMP/Abnormal Bleeding: post menpausal Discussed importance of  follow up if any post-menopausal bleeding: yes  Incontinence Symptoms: positive for urinary frequency symptoms   Breast cancer:  - Last Mammogram: she will scheduled  - BRCA gene screening: N/A  Osteoporosis Prevention : Discussed high calcium  and vitamin D  supplementation, weight bearing exercises Bone density :yes   Cervical cancer screening: not applicable due to age  Skin cancer: Discussed monitoring for atypical lesions  Colorectal cancer: up to date    Lung cancer:  Low Dose CT Chest recommended if Age 66-80 years, 20 pack-year currently smoking OR have quit w/in 15years. Patient does not qualify for screen   ECG: 2024  Advanced Care Planning: A voluntary discussion about advance care planning including the explanation and discussion of advance directives.  Discussed health care proxy and Living will, and the patient was able to identify a health care proxy as daughter .  Patient does not have a living will and power of attorney of health care   Patient Active Problem List   Diagnosis Date Noted   Chronic kidney disease, stage V (HCC) 01/03/2024   Tremors of nervous system 01/03/2024   Simple chronic bronchitis (HCC) 01/03/2024   Dyslipidemia associated with type 2 diabetes mellitus (HCC)  01/03/2024   Ovarian cyst, left 10/03/2023   Dysthymia 10/07/2022   Meralgia paresthetica of right side 11/05/2021   Controlled type 2 diabetes mellitus with microalbuminuria, without long-term current use of insulin (HCC) 11/05/2021   History of colonic polyps    Polyp of transverse colon    End stage kidney disease (HCC) 03/08/2021   Reactive airway disease 05/20/2020   Hypertension associated with type 2 diabetes mellitus (HCC) 05/20/2020   Hyperparathyroidism due to renal insufficiency 06/12/2019   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   Symptomatic anemia 06/12/2019   History of late syphilis 11/26/2018   Incomplete rotator cuff tear or rupture of left shoulder, not specified  as traumatic 07/26/2016   Thoracic aortic atherosclerosis 01/12/2016   DDD (degenerative disc disease), 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/2016   B12 deficiency 01/11/2016   Hernia of abdominal cavity 09/15/2014   Dyslipidemia 08/10/2014   Controlled gout 08/10/2014   Microalbuminuria 08/10/2014   Osteoarthritis of left knee 08/10/2014   Vitamin D  deficiency 08/10/2014   Intraductal papilloma of breast 06/27/2013   Hypertension, essential, benign 09/02/2009   ABNORMAL EKG 09/02/2009    Past Surgical History:  Procedure Laterality Date   AV FISTULA PLACEMENT Left 02/22/2023   Procedure: ARTERIOVENOUS (AV) FISTULA CREATION;  Surgeon: Marea Selinda RAMAN, MD;  Location: [facility] ORS;  Service: Vascular;  Laterality: Left;   BREAST BIOPSY Left 05/24/2013   neg   BREAST EXCISIONAL BIOPSY Left 06/21/2013   neg/ dr. Dessa   BREAST SURGERY Left 2015   COLONOSCOPY  2009   1 polyp found. Dr. Chapman   COLONOSCOPY WITH PROPOFOL  N/A 05/04/2021   Procedure: COLONOSCOPY WITH PROPOFOL ;  Surgeon: Jinny Carmine, MD;  Location: [facility] ENDOSCOPY;  Service: Endoscopy;  Laterality: N/A;   HERNIA REPAIR  10/09/2014   Ventral/umbilical hernia repair with 10 x 10 cm Atrium mesh in preperitoneal position.   INSERTION OF MESH N/A 10/09/2014   Procedure: INSERTION OF MESH;  Surgeon: Reyes LELON Dessa, MD;  Location: [facility] ORS;  Service: General;  Laterality: N/A;   KNEE SURGERY Left 01/14/2009   STRABISMUS SURGERY     Repair as a child   TUBAL LIGATION  1990   VENTRAL HERNIA REPAIR N/A 10/09/2014   Procedure: HERNIA REPAIR VENTRAL ADULT;  Surgeon: Reyes LELON Dessa, MD;  Location: [facility] ORS;  Service: General;  Laterality: N/A;    Family History  Problem Relation Age of Onset   Hypertension Mother    Diabetes Mother    Heart attack Father    Coronary artery disease Father    Breast cancer Father 31   Diabetes Sister    Gout Brother    Heart disease Brother    Allergies Son        Arts Administrator and Publishing Rights Manager    Social History    Socioeconomic History   Marital status: Widowed    Spouse name: Not on file   Number of children: 3   Years of education: 12th Grade   Highest education level: Not on file  Occupational History   Occupation: kitchen prep     Employer: [employer]  Tobacco Use   Smoking status: Former    Current packs/day: 0.00    Types: Cigarettes    Quit date: 10/02/1983    Years since quitting: 40.4   Smokeless tobacco: Never  Vaping Use   Vaping status: Never Used  Substance and Sexual Activity   Alcohol use: No    Alcohol/week: 0.0 standard drinks of alcohol   Drug use: No  Sexual activity: Yes    Partners: Male    Birth control/protection: None    Comment: declined condoms   Other Topics Concern   Not on file  Social History Narrative   Widowed. Living with boyfriend    No regular exercise.   Two other brothers one adopted since 45 days old, died in Mar 26, 2020 with liver cancer, also has a foster brother    Social Drivers of Health   Tobacco Use: Medium Risk (02/26/2024)   Patient History    Smoking Tobacco Use: Former    Smokeless Tobacco Use: Never    Passive Exposure: Not on file  Financial Resource Strain: Low Risk (02/26/2024)   Overall Financial Resource Strain (CARDIA)    Difficulty of Paying Living Expenses: Not hard at all  Food Insecurity: No Food Insecurity (02/26/2024)   Epic    Worried About Programme Researcher, Broadcasting/film/video in the Last Year: Never true    Ran Out of Food in the Last Year: Never true  Transportation Needs: No Transportation Needs (02/26/2024)   Epic    Lack of Transportation (Medical): No    Lack of Transportation (Non-Medical): No  Physical Activity: Inactive (02/26/2024)   Exercise Vital Sign    Days of Exercise per Week: 0 days    Minutes of Exercise per Session: 0 min  Stress: No Stress Concern Present (02/26/2024)   [employer] of Occupational Health - Occupational Stress Questionnaire    Feeling of Stress: Not at all  Social Connections:  Socially Isolated (02/26/2024)   Social Connection and Isolation Panel    Frequency of Communication with Friends and Family: Never    Frequency of Social Gatherings with Friends and Family: Never    Attends Religious Services: Never    Database Administrator or Organizations: No    Attends Banker Meetings: Never    Marital Status: Married  Catering Manager Violence: Not At Risk (02/26/2024)   Epic    Fear of Current or Ex-Partner: No    Emotionally Abused: No    Physically Abused: No    Sexually Abused: No  Depression (PHQ2-9): Low Risk (02/26/2024)   Depression (PHQ2-9)    PHQ-2 Score: 0  Alcohol Screen: Low Risk (02/26/2024)   Alcohol Screen    Last Alcohol Screening Score (AUDIT): 0  Housing: Unknown (02/26/2024)   Epic    Unable to Pay for Housing in the Last Year: No    Number of Times Moved in the Last Year: Not on file    Homeless in the Last Year: No  Utilities: Not At Risk (01/15/2024)   Epic    Threatened with loss of utilities: No  Health Literacy: Adequate Health Literacy (02/26/2024)   B1300 Health Literacy    Frequency of need for help with medical instructions: Never    Current Medications[1]  Allergies[2]   ROS  Constitutional: Negative for fever or weight change.  Respiratory: Negative for cough and shortness of breath.   Cardiovascular: Negative for chest pain or palpitations.  Gastrointestinal: Negative for abdominal pain, no bowel changes.  Musculoskeletal: Negative for gait problem or joint swelling.  Skin: Negative for rash.  Neurological: Negative for dizziness or headache.  No other specific complaints in a complete review of systems (except as listed in HPI above).   Objective  Vitals:   02/26/24 1443  BP: 130/72  Pulse: 65  Resp: 18  Temp: (!) 97.5 F (36.4 C)  SpO2: 97%  Weight: 194 lb 9.6 oz (88.3  kg)  Height: 5' 7 (1.702 m)    Body mass index is 30.48 kg/m.  Physical Exam  Constitutional: Patient appears  well-developed and well-nourished. No distress.  HENT: Head: Normocephalic and atraumatic. Ears: B TMs ok, no erythema or effusion; Nose: Nose normal. Mouth/Throat: Oropharynx is clear and moist. No oropharyngeal exudate.  Eyes: Conjunctivae and EOM are normal. Pupils are equal, round, and reactive to light. No scleral icterus.  Neck: Normal range of motion. Neck supple. No JVD present. No thyromegaly present.  Cardiovascular: Normal rate, regular rhythm and normal heart sounds.  No murmur heard. No BLE edema. Pulmonary/Chest: Effort normal and breath sounds normal. No respiratory distress. Abdominal: Soft. Bowel sounds are normal, no distension. There is no tenderness. no masses Breast: no lumps or masses, no nipple discharge or rashes FEMALE GENITALIA:  Not done  RECTAL: not done  Musculoskeletal: Normal range of motion, no joint effusions. No gross deformities Neurological: he is alert and oriented to person, place, and time. No cranial nerve deficit. Coordination, balance, strength, speech and gait are normal.  Skin: Skin is warm and dry. No rash noted. No erythema.  Psychiatric: Patient has a normal mood and affect. behavior is normal. Judgment and thought content normal.     Assessment & Plan  1. Encounter for osteoporosis screening in asymptomatic postmenopausal patient (Primary)  - DG Bone Density; Future - Lipid panel - Hepatic function panel  2. Encounter for screening mammogram for malignant neoplasm of breast  - MM 3D SCREENING MAMMOGRAM BILATERAL BREAST; Future  3. Colon cancer screening   4. Well woman exam  - MM 3D SCREENING MAMMOGRAM BILATERAL BREAST; Future - DG Bone Density; Future    -USPSTF grade A and B recommendations reviewed with patient; age-appropriate recommendations, preventive care, screening tests, etc discussed and encouraged; healthy living encouraged; see AVS for patient education given to patient -Discussed importance of 150 minutes of  physical activity weekly, eat two servings of fish weekly, eat one serving of tree nuts ( cashews, pistachios, pecans, almonds.SABRA) every other day, eat 6 servings of fruit/vegetables daily and drink plenty of water and avoid sweet beverages.   -Reviewed Health Maintenance: Yes.       [1]  Current Outpatient Medications:    amLODipine -olmesartan  (AZOR ) 10-40 MG tablet, Take 1 tablet by mouth daily., Disp: 90 tablet, Rfl: 1   aspirin 81 MG EC tablet, Take 81 mg by mouth daily., Disp: , Rfl:    budesonide -formoterol  (SYMBICORT ) 160-4.5 MCG/ACT inhaler, Inhale 2 puffs into the lungs 2 (two) times daily., Disp: 3 each, Rfl: 1   calcitRIOL (ROCALTROL) 0.25 MCG capsule, Take 0.25 mcg by mouth daily., Disp: , Rfl:    carvedilol  (COREG ) 12.5 MG tablet, Take 1 tablet (12.5 mg total) by mouth 2 (two) times daily with a meal., Disp: 180 tablet, Rfl: 1   DULoxetine  (CYMBALTA ) 60 MG capsule, Take 1 capsule (60 mg total) by mouth daily., Disp: 90 capsule, Rfl: 1   estrogen, conjugated,-medroxyprogesterone (PREMPRO) 0.625-5 MG tablet, Take 1 tablet by mouth daily., Disp: , Rfl:    febuxostat  (ULORIC ) 40 MG tablet, Take 1 tablet (40 mg total) by mouth daily., Disp: 90 tablet, Rfl: 1   glipiZIDE  (GLUCOTROL  XL) 2.5 MG 24 hr tablet, Take 1 tablet (2.5 mg total) by mouth daily with breakfast., Disp: 90 tablet, Rfl: 1   pioglitazone  (ACTOS ) 15 MG tablet, Take 1 tablet (15 mg total) by mouth daily., Disp: 90 tablet, Rfl: 1   primidone  (MYSOLINE ) 50 MG tablet, Take 1.5  tablets (75 mg total) by mouth at bedtime., Disp: 45 tablet, Rfl: 0   rosuvastatin  (CRESTOR ) 40 MG tablet, Take 1 tablet (40 mg total) by mouth daily., Disp: 90 tablet, Rfl: 1 [2] No Known Allergies

## 2024-02-26 NOTE — Addendum Note (Signed)
 Addended by: GLENARD MIRE F on: 02/26/2024 03:24 PM   Modules accepted: Level of Service

## 2024-02-27 ENCOUNTER — Ambulatory Visit: Payer: Self-pay | Admitting: Family Medicine

## 2024-02-27 LAB — HEPATIC FUNCTION PANEL
AG Ratio: 1.3 (calc) (ref 1.0–2.5)
ALT: 12 U/L (ref 6–29)
AST: 13 U/L (ref 10–35)
Albumin: 4 g/dL (ref 3.6–5.1)
Alkaline phosphatase (APISO): 83 U/L (ref 37–153)
Bilirubin, Direct: 0.1 mg/dL (ref 0.0–0.2)
Globulin: 3.1 g/dL (ref 1.9–3.7)
Indirect Bilirubin: 0.2 mg/dL (ref 0.2–1.2)
Total Bilirubin: 0.3 mg/dL (ref 0.2–1.2)
Total Protein: 7.1 g/dL (ref 6.1–8.1)

## 2024-02-27 LAB — LIPID PANEL
Cholesterol: 158 mg/dL
HDL: 53 mg/dL
LDL Cholesterol (Calc): 82 mg/dL
Non-HDL Cholesterol (Calc): 105 mg/dL
Total CHOL/HDL Ratio: 3 (calc)
Triglycerides: 125 mg/dL

## 2024-03-20 ENCOUNTER — Encounter: Payer: Self-pay | Admitting: Internal Medicine

## 2024-04-01 ENCOUNTER — Inpatient Hospital Stay

## 2024-04-08 ENCOUNTER — Ambulatory Visit: Admitting: Family Medicine

## 2024-04-15 ENCOUNTER — Inpatient Hospital Stay: Admitting: Internal Medicine

## 2024-04-15 ENCOUNTER — Inpatient Hospital Stay

## 2024-05-02 ENCOUNTER — Encounter (INDEPENDENT_AMBULATORY_CARE_PROVIDER_SITE_OTHER)

## 2024-05-02 ENCOUNTER — Ambulatory Visit (INDEPENDENT_AMBULATORY_CARE_PROVIDER_SITE_OTHER): Admitting: Nurse Practitioner
